# Patient Record
Sex: Female | Born: 1975 | Race: White | Hispanic: No | Smoking: Former smoker
Health system: Southern US, Community
[De-identification: ages and names within clinical notes are randomized; demographics above are authoritative.]

## PROBLEM LIST (undated history)

## (undated) DIAGNOSIS — IMO0002 Reserved for concepts with insufficient information to code with codable children: Secondary | ICD-10-CM

## (undated) DIAGNOSIS — G8929 Other chronic pain: Secondary | ICD-10-CM

## (undated) DIAGNOSIS — R229 Localized swelling, mass and lump, unspecified: Secondary | ICD-10-CM

## (undated) DIAGNOSIS — M7918 Myalgia, other site: Secondary | ICD-10-CM

## (undated) DIAGNOSIS — N319 Neuromuscular dysfunction of bladder, unspecified: Secondary | ICD-10-CM

## (undated) DIAGNOSIS — I10 Essential (primary) hypertension: Secondary | ICD-10-CM

---

## 2004-12-04 HISTORY — PX: FRACTURE SURGERY: SHX138

## 2007-04-18 ENCOUNTER — Encounter: Payer: Self-pay | Admitting: Family Medicine

## 2007-04-18 ENCOUNTER — Ambulatory Visit: Payer: Self-pay | Admitting: Family Medicine

## 2007-04-18 ENCOUNTER — Inpatient Hospital Stay (HOSPITAL_COMMUNITY): Admission: AD | Admit: 2007-04-18 | Discharge: 2007-04-22 | Payer: Self-pay | Admitting: Family Medicine

## 2010-12-25 ENCOUNTER — Encounter: Payer: Self-pay | Admitting: Obstetrics & Gynecology

## 2011-04-18 NOTE — H&P (Signed)
Debbie [name], Debbie [name]                ACCOUNT NO.:  [id number]   MEDICAL RECORD NO.:  [id number]          PATIENT TYPE:  INP   LOCATION:  LDR3                          FACILITY:  [facility]   PHYSICIAN:  Lazaro Arms, M.D.   DATE OF BIRTH:  [birth year]   DATE OF ADMISSION:  04/18/2007  DATE OF DISCHARGE:  LH                              HISTORY & PHYSICAL   HISTORY OF PRESENT ILLNESS:  The patient is a 35 year old female gravida  3, para 1, ectopic 1 with estimated date of delivery of 06/09/06 by last  menstrual period and a 13-week sonogram, who is admitted to the hospital  for evaluation for probable evolving pre-eclampsia.   The patient had pre-eclampsia with her first pregnancy, had to be  induced at 36 weeks, had a failed induction and ended up having a  Cesarean section at that time. This pregnancy her blood pressure at her  first couple of visits, 114/68, 112/71, 100/70. Then by 24 weeks it was  140/80, 140/80 and the on the 17th it was 150/90. With a history of pre-  eclampsia we did a work up which included a 24-hour urine and that  returned as 84 mg of protein in 24 hours with an excellent creatinine  clearance. Her uric acid at that time was 3.8 and her platelet count was  177,000. When she came back the following week, again her protein was  negative in her urine. Actually I saw her 6 days later and her blood  pressure was still up at 150/90. At that time I assumed she had  gestational hypertension but was certainly suspicious. As I informed  her, this could be the beginning of an evolving pre-eclamptic clinical  picture. We have seen her weekly. Her blood pressure has been 140/90,  160/100. Recheck was 150/90 and today she comes in and it is 180/100.  She did take her Aldomet 1000 mg this morning and she has, for the first  time, proteinuria. She has had negative protein throughout.   Today she has 3+ protein. She feels fine. She has no blurry vision. No  CNS symptoms. Reports  good fetal movement and evaluation in the office  is unremarkable. She has got 1+ edema. DTRs are normal with no  significant clonus. However, with a history of pre-eclampsia before and  certainly it appears to be an evolving picture at this point, I am going  to admit her to the hospital for laboratory evaluation and observation.  Will do a CBC, a CMP, a uric acid. A 24-hour urine will be begun for  protein and creatinine clearance. Will do an obstetrical ultrasound for  growth and fluid. Do an NST this morning as well. I will also given her  Celestone 12 mg IM  today and repeat tomorrow in anticipation of  probably having to be delivered prematurely. The patient certainly  understands this and understands that if she were to be transferred  within the next few days, which is what I anticipate, she would be  transferred to [facility] for the neonatal intensive care unit  capabilities  there with a 32.5 week baby at this point. She understands  all this and is going over to the hospital.   PAST MEDICAL HISTORY:  Negative.   PAST SURGICAL HISTORY:  She has just had the C section and some ankle  surgery.   PAST OB HISTORY:  She had an ectopic. In 2001 she had a Cesarean section  for a failed induction at a 36-week pre-eclampsia. She does not have,  she states, hypertension in between pregnancies and never had it before  her first pregnancy.   PHYSICAL EXAMINATION:  HEENT: Unremarkable.  NECK: Thyroid is normal.  LUNGS: Her lungs are clear.  HEART: Regular rhythm without regurg or gallop.  BREASTS: Exam deferred.  GU: Fundal height is 33 cm.  PELVIC: Exam is not performed.  EXTREMITIES: With 1 to 2 + edema and DTRs are normal with no beats of  clonus, which again is normal. She certainly is not hyper-reflexic.   IMPRESSION:  1. Intrauterine pregnancy at 32.5 weeks' gestation.  2. Probable evolving pre-eclampsia.  3. Previous Cesarean section.  4. Failed induction secondary  to pre-eclampsia.   PLAN:  As outlined above. Will admit her to the hospital, do a  laboratory panel for pre-eclamptic evaluation, including the 24-hour  urine. Will get Celestone 12 mg IM today and tomorrow. Will do an OB  ultrasound. She understands that this is a fairly dynamic situation and  that transfer could be almost any time based on what her blood pressures  are doing and her symptomatology. She understands this and knows that  she will more than likely be delivered early requiring neonatal  intensive care unit support for the baby.      Lazaro Arms, M.D.  Electronically Signed     LHE/MEDQ  D:  04/18/2007  T:  04/18/2007  Job:  [id number]

## 2011-04-18 NOTE — Op Note (Signed)
Debbie [name], Debbie [name]                ACCOUNT NO.:  [id number]   MEDICAL RECORD NO.:  [id number]          PATIENT TYPE:  INP   LOCATION:  9372                          FACILITY:  [facility]   PHYSICIAN:  Tracy L. Mayford Knife, M.D.DATE OF BIRTH:  1976/10/18   DATE OF PROCEDURE:  DATE OF DISCHARGE:                               OPERATIVE REPORT   PREOPERATIVE DIAGNOSES:  1. A 36-week-4-day intrauterine pregnancy.  2. Severe preeclampsia.  3. Previous cesarean section.   POSTOPERATIVE DIAGNOSES:  1. A 36-week-4-day intrauterine pregnancy.  2. Severe preeclampsia.  3. Previous cesarean section.   PROCEDURE:  Classical cesarean section via Pfannenstiel skin incision.   SURGEON:  Shelbie Proctor. [name], M.D.   ASSISTANT:  Marc Morgans. Mayford Knife, M.D.   ANESTHESIA:  Spinal.   ESTIMATED BLOOD LOSS:  600.   IV FLUIDS:  1200.   URINE OUTPUT:  500.   SPECIMENS:  Placenta to pathology, cord blood to lab.   INDICATIONS:  A 35 year old G3, P0, 1-1-1 at 32-6, dated by her LMP, who  presented for routine care.  Was found to have elevated blood pressure.  She was admitted for observation at [facility].  Her labs came back with  elevated liver function tests, basically required clinical scenario that  she needed to proceed with delivery via cesarean section.  Patient was  counseled on the risks and benefits and wanted to proceed.   FINDINGS:  Viable female infant in the breech presentation.  Apgars 2 and  8.  Cord pH 7.274.  Weight 1386 gm (3.1 pounds).  Amniotic fluid was  clear.  Placenta had a central cord insertion.   PROCEDURE:  Patient was taken to the operating room where spinal  anesthesia was placed and found to be adequate.  Ultrasound was done at  the bedside to confirm presentation.  The infant was initially  transversed back down and moved to breech during the sonogram.  The  patient was prepped and draped in the usual sterile fashion in the  dorsal supine position with the leftward tilt.  A  Pfannenstiel skin  incision was made and carried down to the fascia.  The fascia was nicked  in the midline, and the incision was extended laterally with the Mayo  scissors.  The anterior aspect of the fascial incision was grasped with  Kocher clamp, tented up, and dissected off the rectus muscles using  Mayo's.  The same thing was done inferiorly.  Rectus muscles were  separated in the midline.  The peritoneum was entered and identified.  There were significant adhesions, to the point that the uterus was  levorotated.  There were adhesions to the peritoneum and the rectus  muscles on the right side of the uterus.  We were unable to take this  down.   The bladder blade was inserted.  A classical incision was made.  The  infant was delivered by complete breech extraction.  Neonatology was  present.  Placenta was delivered.  The uterus was cleared of all clots  and debris.  The uterus was partially exteriorized.  The uterus was  closed with  0 Vicryl in a running locked fashion.  The second layer of  the same suture was used.  Next, a 2-0 SH Vicryl was used to close the  serosa.  Excellent hemostasis was obtained following the use of Avitene.   The fascia was closed with 0 Vicryl.  The skin was closed with staples.  Marcaine was injected after the procedure.   Sponge, lap, and needle counts were correct x2.  The patient did receive  Ancef intraoperatively.  The patient tolerated the procedure well.  There were no complications.           ______________________________  Marc Morgans Mayford Knife, M.D.     TLW/MEDQ  D:  04/18/2007  T:  04/18/2007  Job:  [id number]

## 2011-04-21 NOTE — Discharge Summary (Signed)
NAMEKEILYN, HAGGARD                ACCOUNT NO.:  [id number]   MEDICAL RECORD NO.:  [id number]          PATIENT TYPE:  INP   LOCATION:  9319                          FACILITY:  [facility]   PHYSICIAN:  Phil D. Okey Dupre, M.D.     DATE OF BIRTH:  [birth year]   DATE OF ADMISSION:  04/18/2007  DATE OF DISCHARGE:  04/22/2007                               DISCHARGE SUMMARY   ADMISSION DIAGNOSES:  1. Intrauterine pregnancy at 32+ weeks gestation.  2. Severe preeclampsia.  Intrauterine growth retardation.  3. History of previous cesarean section of breech infant.   DISCHARGE DIAGNOSES:  1. Intrauterine pregnancy at 32+ weeks delivered via classical      cesarean section.  2. Severe preeclampsia.  3. Intrauterine growth retardation.  4. History of previous C-section breech infant.   HOSPITAL COURSE:  The patient is a 35 year old gravida 3, para 1-0-1-1  at 32 weeks 4 days gestation who was transferred from [facility] Dr. Despina Hidden for severe preeclampsia.  The patient had presented to  clinic for routine care found to have elevated blood pressure of 223/118  and 193/105.  The patient was admitted to [facility] for a pre-  eclamptic workup.  Of note, the patient's blood pressure had been  steadily increasing over the last month.  She was started on Aldomet 1  month prior to admission with the dosing increased to 1000 b.i.d. one  week prior to admission.  The patient is also with history of  preeclampsia with first pregnancy with a resultant primary C-section for  a preeclampsia.  At clinic on the day of admission, the patient's blood  pressure was 180/100 with new onset of proteinuria; had 3+ protein on  the dipstick; had no headaches or visual changes.   Liver function tests were found to be elevated.  The patient was given  betamethasone X1, started on mag sulfate, and transferred to [facility] for available NICU bed.  On arrival to Hospital, [facility] labs were  repeated.  LFTs  were worsening and the patient was taken for a repeat  cesarean section.  The patient underwent a classical C-section and  delivered a viable female infant with Apgars of 2/8 at one and five  minutes respectively.  Cord pH was 7.27.  Birth weight was 1386 grams.  The baby was taken to the NIC unit.  Please see operative note for  complete details of the cesarean section.   During her postop course, she was continued on magnesium until postop  day #2 at which time her LFT started trending downward and the patient  was diuresing fairly well.  Her postop course was complicated by  elevated blood pressures, primarily on postop day #2 and #3.  Blood  pressures ranged from systolics 120 to 170s over diastolics of 60s to  110.  She required several doses of IV labetalol and hydralazine.  She  remained asymptomatic throughout her postop course.  She was started on  oral labetalol on postop day #1 with gradual increasing of the dosage to  adequately control her blood pressures.  Her heart rate was in the 60s,  which limited how much of the lobe that labetalol she would tolerate.  Once the magnesium was discontinued, she was started on  hydrochlorothiazide 25 mg daily which helped somewhat, but her blood  pressures continued to spike.  Her third agent, lisinopril 20 mg daily  was added given that the patient was not breast feeding.  This improved  her blood pressure with average blood pressures being 120s use over 80s.  She remained stable and was felt to be hemodynamically stable and was  discharged home on postop day #4.   DISCHARGE MEDICATIONS:  1. Motrin 600 mg take one every 6 hours as needed for pain.  2. Percocet 5/325 mg, take one tabs q.6 h as needed for pain.  3. Colace 100 mg p.o. b.i.d.  4. Labetalol 400 mg p.o. b.i.d.  5. Hydrochlorothiazide 25 mg p.o. daily.  6. Lisinopril 20 mg p.o. daily.  7. For contraception, she is to receive __________ at her 6-week      postpartum visit.    FOLLOWUP:  She is to follow up with Dr. Despina Hidden at [facility] in  [place] in 1 week for blood pressure check and in 6 weeks for her  postpartum visits.  Warning signs of elevated or low blood pressures  were discussed in detail with the patient.  She is to continue pelvic  rest and added no heavy lifting X6 weeks.  She was discharged on regular  diet.     ______________________________  Paticia Stack, MD      Phil D. Okey Dupre, M.D.  Electronically Signed    LNJ/MEDQ  D:  05/20/2007  T:  05/20/2007  Job:  [id number]   cc:   Javier Glazier. Okey Dupre, M.D.

## 2011-05-02 ENCOUNTER — Emergency Department (HOSPITAL_COMMUNITY)
Admission: EM | Admit: 2011-05-02 | Discharge: 2011-05-02 | Disposition: A | Discharge status: Home / Self Care | Payer: Medicaid Other | Attending: Emergency Medicine | Admitting: Emergency Medicine

## 2011-05-02 DIAGNOSIS — T6391XA Toxic effect of contact with unspecified venomous animal, accidental (unintentional), initial encounter: Secondary | ICD-10-CM | POA: Insufficient documentation

## 2011-05-02 DIAGNOSIS — T63461A Toxic effect of venom of wasps, accidental (unintentional), initial encounter: Secondary | ICD-10-CM | POA: Insufficient documentation

## 2011-05-02 DIAGNOSIS — Y998 Other external cause status: Secondary | ICD-10-CM | POA: Insufficient documentation

## 2011-12-05 DIAGNOSIS — IMO0002 Reserved for concepts with insufficient information to code with codable children: Secondary | ICD-10-CM

## 2011-12-05 HISTORY — PX: SPINE SURGERY: SHX786

## 2011-12-05 HISTORY — DX: Reserved for concepts with insufficient information to code with codable children: IMO0002

## 2012-05-29 ENCOUNTER — Emergency Department (HOSPITAL_COMMUNITY)
Admission: EM | Admit: 2012-05-29 | Discharge: 2012-05-30 | Disposition: A | Discharge status: Home / Self Care | Payer: Medicaid Other | Attending: Emergency Medicine | Admitting: Emergency Medicine

## 2012-05-29 ENCOUNTER — Emergency Department (HOSPITAL_COMMUNITY): Payer: Medicaid Other

## 2012-05-29 ENCOUNTER — Encounter (HOSPITAL_COMMUNITY): Payer: Self-pay | Admitting: *Deleted

## 2012-05-29 DIAGNOSIS — M545 Low back pain, unspecified: Secondary | ICD-10-CM | POA: Insufficient documentation

## 2012-05-29 DIAGNOSIS — M549 Dorsalgia, unspecified: Secondary | ICD-10-CM

## 2012-05-29 DIAGNOSIS — R29898 Other symptoms and signs involving the musculoskeletal system: Secondary | ICD-10-CM

## 2012-05-29 DIAGNOSIS — F17200 Nicotine dependence, unspecified, uncomplicated: Secondary | ICD-10-CM | POA: Insufficient documentation

## 2012-05-29 NOTE — ED Notes (Signed)
EKG completed and given to Dr. Adriana Simas

## 2012-05-29 NOTE — ED Notes (Signed)
Pt ambulated to bathroom, gait uncoordinated especially lt sided. Pt able to walk unassisted but appears unsteady. Lt knee pops back and locks on each step.

## 2012-05-29 NOTE — ED Notes (Signed)
Low back pain, with radiation down lt leg , "my lt leg is dragging".  X 2 weeks.  No injury

## 2012-05-29 NOTE — ED Provider Notes (Addendum)
History   This chart was scribed for [employer]. Colon Branch, MD by Melba Coon. The patient was seen in room APA17/APA17 and the patient's care was started at 11:04PM.    CSN: [id number]  Arrival date & time 05/29/12  2150   First MD Initiated Contact with Patient 05/29/12 2300      Chief Complaint  Patient presents with  . Back Pain    (Consider location/radiation/quality/duration/timing/severity/associated sxs/prior treatment) HPI Debbie [name] is a 36 y.o. female who presents to the Emergency Department complaining of constant, moderate  lower right back pain with an onset 2 weeks ago. This is associated with left leg weakness. Pt states that her left leg is "dragging". No falls or trauma to affected area. Stretching her legs aggravates the pain.  No HA, fever, neck pain, sore throat, rash, CP, SOB, n/v/d, dysuria, or extremity edema, weakness to other extremities, numbness, tingling, difficulty talking or swallowing. No known allergies. .  No PCP  History reviewed. No pertinent past medical history.  Past Surgical History  Procedure Date  . Fracture surgery     History reviewed. No pertinent family history.  History  Substance Use Topics  . Smoking status: Current Everyday Smoker  . Smokeless tobacco: Not on file  . Alcohol Use: Yes    OB History    Grav Para Term Preterm Abortions TAB SAB Ect Mult Living                  Review of Systems 10 Systems reviewed and all are negative for acute change except as noted in the HPI.   Allergies  Review of patient's allergies indicates no known allergies.  Home Medications   Current Outpatient Rx  Name Route Sig Dispense Refill  . IBUPROFEN 200 MG PO TABS Oral Take 800-1,000 mg by mouth 5 (five) times daily as needed. For pain      BP 197/117  Pulse 93  Temp 98.4 F (36.9 C) (Oral)  Resp 20  Ht 5' 8.5" (1.74 m)  Wt 200 lb (90.719 kg)  BMI 29.97 kg/m2  SpO2 99%  LMP 05/04/2012  Physical Exam  Nursing note  and vitals reviewed. Constitutional: She is oriented to person, place, and time. She appears well-developed and well-nourished. No distress.  HENT:  Head: Normocephalic and atraumatic.  Right Ear: External ear normal.  Left Ear: External ear normal.  Eyes: EOM are normal.  Neck: Normal range of motion. No tracheal deviation present.  Cardiovascular: Normal rate.   Pulmonary/Chest: Effort normal. No respiratory distress.  Abdominal: There is no tenderness.  Musculoskeletal: Normal range of motion. She exhibits tenderness (right lower back). She exhibits no edema.       No palpable deformities on back.  Neurological: She is alert and oriented to person, place, and time.  Skin: Skin is warm and dry.  Psychiatric: She has a normal mood and affect. Her behavior is normal.    ED Course  Procedures (including critical care time)  DIAGNOSTIC STUDIES: Oxygen Saturation is 99% on room air, normal by my interpretation.    COORDINATION OF CARE:  11:08PM - EDMD will examine pt walking to notice if there is leg dragging 11:16PM - Pt was walked around the ED; antalgic gait with mild foot drop on right.  Dg Lumbar Spine Complete  05/30/2012  *RADIOLOGY REPORT*  Clinical Data: 36 year old female with low back pain times 3 weeks.  LUMBAR SPINE - COMPLETE 4+ VIEW  Comparison: None.  Findings: For the purposes of  this report, the lowest full size ribs will be designated at T11, with absent ribs at T12.  Bone mineralization is within normal limits.  Normal vertebral height and alignment.  Relatively preserved disc spaces.  No pars fracture.  Sacral ala and SI joints within normal limits.  IMPRESSION: Transitional anatomy, otherwise negative radiographic appearance of the lumbar spine.  Original Report Authenticated By: Harley Hallmark, M.D.    Date: 05/29/2012   2230  Rate: 74  Rhythm: normal sinus rhythm  QRS Axis: normal  Intervals: normal  ST/T Wave abnormalities: nonspecific ST/T changes   Conduction Disutrbances:none  Narrative Interpretation:   Old EKG Reviewed: none available   MDM  Patient with low back pain and weakness to left leg x 2 weeks. LS spine films negative. Patient scheduled for MRI. Pt stable in ED with no significant deterioration in condition.The patient appears reasonably screened and/or stabilized for discharge and I doubt any other medical condition or other [facility] requiring further screening, evaluation, or treatment in the ED at this time prior to discharge.  I personally performed the services described in this documentation, which was scribed in my presence. The recorded information has been reviewed and considered.   MDM Reviewed: nursing note and vitals Interpretation: x-ray          Nicoletta Dress. Colon Branch, MD 05/30/12 0981  Nicoletta Dress. Colon Branch, MD 06/20/12 2123

## 2012-05-30 LAB — URINALYSIS, ROUTINE W REFLEX MICROSCOPIC
Bilirubin Urine: NEGATIVE
Nitrite: POSITIVE — AB
Urobilinogen, UA: 0.2 mg/dL (ref 0.0–1.0)

## 2012-05-30 MED ORDER — HYDROCODONE-ACETAMINOPHEN 5-325 MG PO TABS
1.0000 | ORAL_TABLET | Freq: Once | ORAL | Status: AC
  Administered 2012-05-30: 1 via ORAL
  Filled 2012-05-30: qty 1

## 2012-05-30 MED ORDER — HYDROCODONE-ACETAMINOPHEN 5-325 MG PO TABS: 1.0000 | ORAL_TABLET | ORAL | Status: AC | PRN

## 2012-05-30 MED ORDER — IBUPROFEN 800 MG PO TABS
800.0000 mg | ORAL_TABLET | Freq: Once | ORAL | Status: AC
  Administered 2012-05-30: 800 mg via ORAL
  Filled 2012-05-30: qty 1

## 2012-05-30 NOTE — Discharge Instructions (Signed)
Your xray here tonight did not show anything. You are being scheduled for an MRI. The radiology department will call you tomorrow with the date and time. Once you have finished your MRI you will be brought to the ER waiting room where the doctor on duty will review your results with you. Use the pain medicine as needed.

## 2012-05-30 NOTE — ED Notes (Signed)
Discharge instructions reviewed with pt, questions answered. Pt verbalized understanding.  

## 2012-07-08 ENCOUNTER — Emergency Department (HOSPITAL_COMMUNITY)
Admission: EM | Admit: 2012-07-08 | Discharge: 2012-07-08 | Disposition: A | Discharge status: Home / Self Care | Payer: Medicaid Other | Attending: Emergency Medicine | Admitting: Emergency Medicine

## 2012-07-08 ENCOUNTER — Encounter (HOSPITAL_COMMUNITY): Payer: Self-pay | Admitting: *Deleted

## 2012-07-08 DIAGNOSIS — F17200 Nicotine dependence, unspecified, uncomplicated: Secondary | ICD-10-CM | POA: Insufficient documentation

## 2012-07-08 DIAGNOSIS — N390 Urinary tract infection, site not specified: Secondary | ICD-10-CM | POA: Insufficient documentation

## 2012-07-08 DIAGNOSIS — I10 Essential (primary) hypertension: Secondary | ICD-10-CM

## 2012-07-08 HISTORY — DX: Essential (primary) hypertension: I10

## 2012-07-08 LAB — URINALYSIS, ROUTINE W REFLEX MICROSCOPIC
Ketones, ur: NEGATIVE mg/dL
Protein, ur: NEGATIVE mg/dL
Urobilinogen, UA: 0.2 mg/dL (ref 0.0–1.0)

## 2012-07-08 LAB — CBC WITH DIFFERENTIAL/PLATELET
Eosinophils Relative: 2 % (ref 0–5)
HCT: 40 % (ref 36.0–46.0)
Lymphocytes Relative: 33 % (ref 12–46)
Lymphs Abs: 3.2 10*3/uL (ref 0.7–4.0)
MCV: 91.5 fL (ref 78.0–100.0)
Monocytes Absolute: 1 10*3/uL (ref 0.1–1.0)
RBC: 4.37 MIL/uL (ref 3.87–5.11)
WBC: 9.6 10*3/uL (ref 4.0–10.5)

## 2012-07-08 LAB — COMPREHENSIVE METABOLIC PANEL
ALT: 10 U/L (ref 0–35)
CO2: 24 mEq/L (ref 19–32)
Calcium: 9.5 mg/dL (ref 8.4–10.5)
Creatinine, Ser: 0.65 mg/dL (ref 0.50–1.10)
GFR calc Af Amer: >90 mL/min (ref >90)
GFR calc non Af Amer: >90 mL/min (ref >90)
Glucose, Bld: 88 mg/dL (ref 70–99)
Sodium: 136 mEq/L (ref 135–145)

## 2012-07-08 LAB — URINE MICROSCOPIC-ADD ON

## 2012-07-08 MED ORDER — CEPHALEXIN 500 MG PO CAPS: 500.0000 mg | ORAL_CAPSULE | Freq: Four times a day (QID) | ORAL | Status: AC

## 2012-07-08 MED ORDER — HYDROCHLOROTHIAZIDE 12.5 MG PO TABS: 12.5000 mg | ORAL_TABLET | Freq: Every day | ORAL | Status: DC

## 2012-07-08 MED ORDER — HYDROCHLOROTHIAZIDE 25 MG PO TABS
25.0000 mg | ORAL_TABLET | Freq: Every day | ORAL | Status: DC
  Administered 2012-07-08: 25 mg via ORAL
  Filled 2012-07-08 (×3): qty 1

## 2012-07-08 NOTE — ED Provider Notes (Signed)
History     CSN: [id number]  Arrival date & time 07/08/12  1556   First MD Initiated Contact with Patient 07/08/12 1651      Chief Complaint  Patient presents with  . Hypertension    (Consider location/radiation/quality/duration/timing/severity/associated sxs/prior treatment) HPI Comments: Patient sent from health department elevated blood pressure. Denies symptoms. Had hypotension when she was pregnant but not after. She denies any chest pain, visual change, headache, shortness of breath, nausea vomiting or abdominal pain. She's being seen at the health department for low back pain which is a constantly for the past 2 months as previously evaluated in June here. She denies any change in his pain, focal weakness, numbness, tingling, bowel or bladder incontinence. She was scheduled for an MRI which he did not have completed.  The history is provided by the patient.    Past Medical History  Diagnosis Date  . Hypertension     Past Surgical History  Procedure Date  . Fracture surgery   . Cesarean section     History reviewed. No pertinent family history.  History  Substance Use Topics  . Smoking status: Current Everyday Smoker  . Smokeless tobacco: Not on file  . Alcohol Use: Yes     occ    OB History    Grav Para Term Preterm Abortions TAB SAB Ect Mult Living                  Review of Systems  Constitutional: Negative for fever, activity change and appetite change.  HENT: Negative for congestion and rhinorrhea.   Respiratory: Negative for cough, chest tightness and shortness of breath.   Cardiovascular: Negative for chest pain.  Gastrointestinal: Negative for nausea, vomiting and abdominal pain.  Genitourinary: Negative for dysuria.  Musculoskeletal: Positive for back pain.  Skin: Negative for rash.  Neurological: Negative for dizziness, weakness and headaches.  Hematological: Negative for adenopathy.    Allergies  Honey bee treatment  Home Medications    Current Outpatient Rx  Name Route Sig Dispense Refill  . IBUPROFEN 200 MG PO TABS Oral Take 800-1,000 mg by mouth every 6 (six) hours as needed. For pain    . NAPROXEN SODIUM 220 MG PO CAPS Oral Take 660 mg by mouth 3 (three) times daily as needed. For pain      BP 189/105  Pulse 76  Temp 98.6 F (37 C) (Oral)  Resp 20  Ht 5\' 7"  (1.702 m)  Wt 204 lb (92.534 kg)  BMI 31.95 kg/m2  SpO2 100%  LMP 06/03/2012  Physical Exam  Constitutional: She appears well-developed. No distress.  HENT:  Head: Normocephalic and atraumatic.  Mouth/Throat: Oropharynx is clear and moist. No oropharyngeal exudate.  Eyes: Conjunctivae are normal. Pupils are equal, round, and reactive to light.  Neck: Normal range of motion. Neck supple.  Cardiovascular: Normal rate, regular rhythm and normal heart sounds.   No murmur heard. Pulmonary/Chest: Effort normal and breath sounds normal. No respiratory distress.  Abdominal: Soft. There is no tenderness. There is no rebound and no guarding.  Musculoskeletal: Normal range of motion. She exhibits no edema and no tenderness.  Neurological: She is alert. She has normal reflexes. No cranial nerve deficit.       5/5 bilateral lower extremities. +2 DP and PT pulses. Great toe extension intact bilaterally.  +2 patellar reflexes bilaterally.  Distal sensation intact.  Skin: Skin is warm.    ED Course  Procedures (including critical care time)  Labs Reviewed  URINALYSIS,  ROUTINE W REFLEX MICROSCOPIC - Abnormal; Notable for the following:    APPearance CLOUDY (*)     Hgb urine dipstick SMALL (*)     Nitrite POSITIVE (*)     All other components within normal limits  URINE MICROSCOPIC-ADD ON - Abnormal; Notable for the following:    Squamous Epithelial / LPF MANY (*)     Bacteria, UA MANY (*)     All other components within normal limits  CBC WITH DIFFERENTIAL  COMPREHENSIVE METABOLIC PANEL  TROPONIN I   No results found.   No diagnosis found.    MDM   Asymptomatic hypertension.  No neurological deficits. No chest pain, shortness of breath, nausea or vomiting. No headache or visual change.  Screening labs to evaluate for end organ damage.  Labs reviewed. No evidence of endorgan damage. We'll treat UTI.  Start patient on low-dose hydrochlorothiazide for hypertension. Followup with her doctor at the health department.    Date: 07/08/2012  Rate: 74  Rhythm: normal sinus rhythm  QRS Axis: normal  Intervals: normal  ST/T Wave abnormalities: normal  Conduction Disutrbances:none  Narrative Interpretation:   Old EKG Reviewed: none available    Glynn Octave, MD 07/08/12 1825

## 2012-07-08 NOTE — ED Notes (Signed)
Sent from health dept due to elevated bp.

## 2012-07-29 ENCOUNTER — Other Ambulatory Visit (HOSPITAL_COMMUNITY): Payer: Self-pay | Admitting: Nurse Practitioner

## 2012-07-29 DIAGNOSIS — M25551 Pain in right hip: Secondary | ICD-10-CM

## 2012-07-29 DIAGNOSIS — M549 Dorsalgia, unspecified: Secondary | ICD-10-CM

## 2012-08-01 ENCOUNTER — Ambulatory Visit (HOSPITAL_COMMUNITY)
Admission: RE | Admit: 2012-08-01 | Discharge: 2012-08-01 | Disposition: A | Discharge status: Home / Self Care | Payer: Medicaid Other | Source: Ambulatory Visit | Attending: Family Medicine | Admitting: Family Medicine

## 2012-08-01 DIAGNOSIS — R209 Unspecified disturbances of skin sensation: Secondary | ICD-10-CM | POA: Insufficient documentation

## 2012-08-01 DIAGNOSIS — M545 Low back pain, unspecified: Secondary | ICD-10-CM | POA: Insufficient documentation

## 2012-08-01 DIAGNOSIS — M549 Dorsalgia, unspecified: Secondary | ICD-10-CM

## 2012-08-01 DIAGNOSIS — M51379 Other intervertebral disc degeneration, lumbosacral region without mention of lumbar back pain or lower extremity pain: Secondary | ICD-10-CM | POA: Insufficient documentation

## 2012-08-01 DIAGNOSIS — M25551 Pain in right hip: Secondary | ICD-10-CM

## 2012-08-01 DIAGNOSIS — M5137 Other intervertebral disc degeneration, lumbosacral region: Secondary | ICD-10-CM | POA: Insufficient documentation

## 2012-08-01 DIAGNOSIS — M5126 Other intervertebral disc displacement, lumbar region: Secondary | ICD-10-CM | POA: Insufficient documentation

## 2012-11-07 ENCOUNTER — Inpatient Hospital Stay (HOSPITAL_COMMUNITY)
Admission: EM | Admit: 2012-11-07 | Discharge: 2012-11-18 | DRG: 982 | Disposition: A | Discharge status: Rehabilitation Facility | Payer: Medicaid Other | Attending: Internal Medicine | Admitting: Internal Medicine

## 2012-11-07 ENCOUNTER — Emergency Department (HOSPITAL_COMMUNITY): Payer: Medicaid Other

## 2012-11-07 ENCOUNTER — Encounter (HOSPITAL_COMMUNITY): Payer: Self-pay | Admitting: *Deleted

## 2012-11-07 DIAGNOSIS — R209 Unspecified disturbances of skin sensation: Secondary | ICD-10-CM | POA: Diagnosis present

## 2012-11-07 DIAGNOSIS — R32 Unspecified urinary incontinence: Secondary | ICD-10-CM | POA: Diagnosis present

## 2012-11-07 DIAGNOSIS — R29898 Other symptoms and signs involving the musculoskeletal system: Secondary | ICD-10-CM | POA: Diagnosis present

## 2012-11-07 DIAGNOSIS — G8220 Paraplegia, unspecified: Secondary | ICD-10-CM | POA: Diagnosis present

## 2012-11-07 DIAGNOSIS — R200 Anesthesia of skin: Secondary | ICD-10-CM

## 2012-11-07 DIAGNOSIS — F17200 Nicotine dependence, unspecified, uncomplicated: Secondary | ICD-10-CM | POA: Diagnosis present

## 2012-11-07 DIAGNOSIS — D1779 Benign lipomatous neoplasm of other sites: Principal | ICD-10-CM | POA: Diagnosis present

## 2012-11-07 DIAGNOSIS — G9520 Unspecified cord compression: Secondary | ICD-10-CM | POA: Diagnosis present

## 2012-11-07 DIAGNOSIS — R159 Full incontinence of feces: Secondary | ICD-10-CM | POA: Diagnosis present

## 2012-11-07 DIAGNOSIS — I10 Essential (primary) hypertension: Secondary | ICD-10-CM | POA: Diagnosis present

## 2012-11-07 DIAGNOSIS — E876 Hypokalemia: Secondary | ICD-10-CM | POA: Diagnosis present

## 2012-11-07 DIAGNOSIS — K5900 Constipation, unspecified: Secondary | ICD-10-CM | POA: Diagnosis present

## 2012-11-07 DIAGNOSIS — Z79899 Other long term (current) drug therapy: Secondary | ICD-10-CM

## 2012-11-07 DIAGNOSIS — G959 Disease of spinal cord, unspecified: Secondary | ICD-10-CM | POA: Diagnosis present

## 2012-11-07 LAB — CBC WITH DIFFERENTIAL/PLATELET
Eosinophils Relative: 3 % (ref 0–5)
HCT: 39.1 % (ref 36.0–46.0)
Hemoglobin: 13.4 g/dL (ref 12.0–15.0)
Lymphocytes Relative: 40 % (ref 12–46)
Lymphs Abs: 4.8 10*3/uL — ABNORMAL HIGH (ref 0.7–4.0)
MCV: 91.4 fL (ref 78.0–100.0)
Monocytes Absolute: 0.9 10*3/uL (ref 0.1–1.0)
Monocytes Relative: 8 % (ref 3–12)
RBC: 4.28 MIL/uL (ref 3.87–5.11)
RDW: 13.5 % (ref 11.5–15.5)
WBC: 11.9 10*3/uL — ABNORMAL HIGH (ref 4.0–10.5)

## 2012-11-07 LAB — BASIC METABOLIC PANEL
CO2: 25 mEq/L (ref 19–32)
Calcium: 9.7 mg/dL (ref 8.4–10.5)
Chloride: 104 mEq/L (ref 96–112)
Creatinine, Ser: 0.73 mg/dL (ref 0.50–1.10)
Glucose, Bld: 98 mg/dL (ref 70–99)

## 2012-11-07 NOTE — ED Notes (Signed)
Updated on plan of care: aware that she will be transported to MRI and that lab will come draw blood. No further needs at this time.

## 2012-11-07 NOTE — ED Notes (Signed)
Patient transported to MRI 

## 2012-11-07 NOTE — ED Notes (Signed)
The pt has chronic back pauring this time she has had a fall.  She had a mri in [place] and she has had more pain and loss of mobility with loss of bowel and bladder control for the past 1-2 weeks and

## 2012-11-07 NOTE — ED Provider Notes (Signed)
History     CSN: [id number]  Arrival date & time 11/07/12  1646   First MD Initiated Contact with Patient 11/07/12 2032      Chief Complaint  Patient presents with  . Back Pain    (Consider location/radiation/quality/duration/timing/severity/associated sxs/prior treatment) HPI Comments: This is a 36 year old female, who presents emergency department with chief complaint of chronic low back pain. She states that one week ago she began having bowel and bladder incontinence. She states that sometimes she is able to feel the need to urinate or have a bowel movement but at other times she does not feel the urge and finds that she has become incontinent of bowel and/or bladder. She states that she has numbness in bilateral lower extremities. She also endorses several falls secondary to leg weakness. She has an appointment scheduled with Dr. Channing Mutters on December 12. She has tried Tylenol, ibuprofen, and Naprosyn to alleviate her symptoms, with no relief.  The history is provided by the patient. No language interpreter was used.    Past Medical History  Diagnosis Date  . Hypertension     Past Surgical History  Procedure Date  . Fracture surgery   . Cesarean section     History reviewed. No pertinent family history.  History  Substance Use Topics  . Smoking status: Current Every Day Smoker  . Smokeless tobacco: Not on file  . Alcohol Use: Yes     Comment: occ    OB History    Grav Para Term Preterm Abortions TAB SAB Ect Mult Living                  Review of Systems  All other systems reviewed and are negative.    Allergies  Honey bee treatment  Home Medications   Current Outpatient Rx  Name  Route  Sig  Dispense  Refill  . HYDROCHLOROTHIAZIDE 12.5 MG PO TABS   Oral   Take 1 tablet (12.5 mg total) by mouth daily.   30 tablet   0   . IBUPROFEN 200 MG PO TABS   Oral   Take 800-1,000 mg by mouth every 6 (six) hours as needed. For pain         . NAPROXEN SODIUM  220 MG PO CAPS   Oral   Take 660 mg by mouth 3 (three) times daily as needed. For pain           BP 169/99  Pulse 87  Temp 98.7 F (37.1 C) (Oral)  Resp 16  SpO2 98%  LMP 11/07/2012  Physical Exam  Nursing note and vitals reviewed. Constitutional: She is oriented to person, place, and time. She appears well-developed and well-nourished.  HENT:  Head: Normocephalic and atraumatic.  Eyes: Conjunctivae normal and EOM are normal. Pupils are equal, round, and reactive to light.  Neck: Normal range of motion. Neck supple.  Cardiovascular: Normal rate and regular rhythm.  Exam reveals no gallop and no friction rub.   No murmur heard. Pulmonary/Chest: Effort normal and breath sounds normal. No respiratory distress. She has no wheezes. She has no rales. She exhibits no tenderness.  Abdominal: Soft. Bowel sounds are normal. She exhibits no distension and no mass. There is no tenderness. There is no rebound and no guarding.  Genitourinary: Rectum normal. Rectal exam shows anal tone normal.  Musculoskeletal: Normal range of motion. She exhibits no edema and no tenderness.  Neurological: She is alert and oriented to person, place, and time.  Patella and Achilles reflexes hyperreactive bilaterally, unable to distinguish between sharp and dull sensation, unable to sense light touch with eyes closed.  Skin: Skin is warm and dry.  Psychiatric: She has a normal mood and affect. Her behavior is normal. Judgment and thought content normal.    ED Course  Procedures (including critical care time)  Results for orders placed during the hospital encounter of 11/07/12  CBC WITH DIFFERENTIAL      Component Value Range   WBC 11.9 (*) 4.0 - 10.5 K/uL   RBC 4.28  3.87 - 5.11 MIL/uL   Hemoglobin 13.4  12.0 - 15.0 g/dL   HCT 40.9  81.1 - 91.4 %   MCV 91.4  78.0 - 100.0 fL   MCH 31.3  26.0 - 34.0 pg   MCHC 34.3  30.0 - 36.0 g/dL   RDW 78.2  95.6 - 21.3 %   Platelets 259  150 - 400 K/uL    Neutrophils Relative 49  43 - 77 %   Neutro Abs 5.8  1.7 - 7.7 K/uL   Lymphocytes Relative 40  12 - 46 %   Lymphs Abs 4.8 (*) 0.7 - 4.0 K/uL   Monocytes Relative 8  3 - 12 %   Monocytes Absolute 0.9  0.1 - 1.0 K/uL   Eosinophils Relative 3  0 - 5 %   Eosinophils Absolute 0.3  0.0 - 0.7 K/uL   Basophils Relative 0  0 - 1 %   Basophils Absolute 0.1  0.0 - 0.1 K/uL  BASIC METABOLIC PANEL      Component Value Range   Sodium 142  135 - 145 mEq/L   Potassium 3.4 (*) 3.5 - 5.1 mEq/L   Chloride 104  96 - 112 mEq/L   CO2 25  19 - 32 mEq/L   Glucose, Bld 98  70 - 99 mg/dL   BUN 13  6 - 23 mg/dL   Creatinine, Ser 0.86  0.50 - 1.10 mg/dL   Calcium 9.7  8.4 - 57.8 mg/dL   GFR calc non Af Amer >90  >90 mL/min   GFR calc Af Amer >90  >90 mL/min   Mr Lumbar Spine Wo Contrast  11/07/2012  *RADIOLOGY REPORT*  Clinical Data: Worsening bowel and bladder incontinence for the past week.  MRI LUMBAR SPINE WITHOUT CONTRAST  Technique:  Multiplanar and multiecho pulse sequences of the lumbar spine were obtained without intravenous contrast.  Comparison: 08/01/2012.  Findings: Last fully open disc space is labeled L5-S1.  Present examination incorporates from T10-11 disc space through the lower sacrum.  Conus L1 level.  No compression of the distal cord or conus.  No abnormal signal within the distal cord or conus.  Visualized paravertebral structures without gross abnormality. Evaluation slightly limited by respiratory motion.  No worrisome bony lesion.  T10-11:  Mild left-sided facet bony overgrowth.  T11-12:  Minimal left facet bony overgrowth.  T12-L1:  Negative.  L1-2:  Negative.  L2-3:  Very small left foraminal/lateral protrusion approaching but not compressing the exiting left L2 nerve root.  Minimal bulge. Very mild facet joint degenerative changes.  L3-4:  Prominent right-sided facet joint degenerative changes containing fluid undermining the right ligamenta flava.  Mild left- sided facet joint degenerative  changes.  Bulge with lateral extension approaching but not compressing the exiting L3 nerve roots.  Dorsal epidural fat.  Mild spinal stenosis  L4-5:  Mild facet joint degenerative changes greater on the right. Mild ligamentum flavum hypertrophy.  Bulge.  Left lateral disc osteophyte complex approaches but does not compress the exiting left L4 nerve root.  Very mild spinal stenosis.  L5-S1:  No significant spinal stenosis or foraminal narrowing.  IMPRESSION: No conus compression detected as cause of patient's symptoms.  Scattered degenerative changes most notable at the L3-4 level as detailed above. Findings without significant change.   Original Report Authenticated By: Lacy Duverney, M.D.       1. Bowel and bladder incontinence   2. Lower extremity numbness       MDM  36 year old female with low back pain, and bowel and bladder incontinence. I have discussed this patient with Dr. exam and, who is also personally seen the patient. I have also consulted Dr. Venetia Maxon, who recommends repeat MRI, and if no surgical process, to consult neurology.  11:02 PM Received MRI results.  Discussed them with Dr. Estell Harpin.  Will consult neurology per Dr. Fredrich Birks recommendations.  11:18 PM Spoke with Dr. Roseanne Reno with Neurology.  He tells me to have the patient admitted to medicine, and he will see the patient in the morning.  The patient will need cervical and thoracic MRI.   11:59 PM Spoke with Hospitalist, patient will be admitted.  Neuro to see patient in the AM, cervical and thoracic MRI in the AM.  Dr. Izola Price will be the admitting physician.       Roxy Horseman, PA-C 11/08/12 0001

## 2012-11-07 NOTE — ED Notes (Signed)
Pt returned from MRI. A.O. X 4. Pain 10/10. Laying flat on stretcher per request. NAD.

## 2012-11-07 NOTE — ED Notes (Addendum)
Pt reports pinched static nerve. Reports MRI in aug. Has appointment with neurologist Dec 12 in [place]. Unable to control bowel and bladder function x 1 week. Unable to ambulate without assistance x 1 week. " My legs give out". Reports decreased sensation in lower extremity bilaterally.  Reports family hxt of MS (father). Pt has not been tested. Pain in right hip 10/10. Currently taking ibuprofen with no relief.  Reports metal rod in right ankle.  Witness muscle tremor in left foot lasting approx 5 seconds. Pt laying flat per request. A.O. X 4.

## 2012-11-07 NOTE — H&P (Addendum)
[facility] History and Physical  DESTANI WAMSER EAV:[id number] DOB: [birth year] DOA: 11/07/2012  Referring physician: ED physician PCP: Tylene Fantasia., PA   Chief Complaint: Bilateral  Lower extremity weakness  HPI:  Pt is 36 yo female who presents with one week long progressive lower extremity weakness associated with poor ambulation and unsteady gait, pain, urinary incontinence. She describes pain as intermittent, sharp, 7/10 in severity when presents with no specific alleviating factors and no specific aggravating factors. She denies fevers, chills, recent medications changes, no recent hospitalizations and no recent traveling, no sick contacts or exposures. Pt denies similar events  In the past. She also experiences occasional numbness and tingling in lower extremities.   Assessment and Plan: Principal Problem:  *Lower extremity weakness - unclear etiology and certainly possible demyelinating process: ? Transverse myelitis vs MS vs other neuropathy - neurology consulted and Dr. Roseanne Reno to see in AM - supportive care for now, analgesia as needed - MRI cervical and thoracic spine ordered per neurology recommendations - PT evaluation  - obtain UDS Active Problems:  HTN (hypertension) - BP above target range - continue home medication regimen for now as I am not clear if pt is quite compliant with her therapy - UDS pending   Hypokalemia - mild - will supplement - BMP in AM  Code Status: Full Family Communication: Pt at bedside Disposition Plan: PT evaluation, admit to medical floor   Review of Systems:  Constitutional: Negative for fever, chills and malaise/fatigue. Negative for diaphoresis.  HENT: Negative for hearing loss, ear pain, nosebleeds, congestion, sore throat, neck pain, tinnitus and ear discharge.   Eyes: Negative for blurred vision, double vision, photophobia, pain, discharge and redness.  Respiratory: Negative for cough, hemoptysis, sputum production,  shortness of breath, wheezing and stridor.   Cardiovascular: Negative for chest pain, palpitations, orthopnea, claudication and leg swelling.  Gastrointestinal: Negative for nausea, vomiting and abdominal pain. Negative for heartburn, constipation, blood in stool and melena.  Genitourinary: Negative for dysuria, urgency, frequency, hematuria and flank pain.  Musculoskeletal: Negative for myalgias, back pain, joint pain and falls.  Skin: Negative for itching and rash.  Neurological: Negative for dizziness, positive for lower extremity weakness and numbness  Endo/Heme/Allergies: Negative for environmental allergies and polydipsia. Does not bruise/bleed easily.  Psychiatric/Behavioral: Negative for suicidal ideas. The patient is not nervous/anxious.      Past Medical History  Diagnosis Date  . Hypertension     Past Surgical History  Procedure Date  . Fracture surgery   . Cesarean section     Social History:  reports that she has been smoking.  She does not have any smokeless tobacco history on file. She reports that she drinks alcohol. She reports that she does not use illicit drugs.  Allergies  Allergen Reactions  . Honey Bee Treatment (Bee Venom) Anaphylaxis and Itching    No family history of heart disease or cancers.  Prior to Admission medications   Medication Sig Start Date End Date Taking? Authorizing Provider  acetaminophen (TYLENOL) 500 MG tablet Take 2,000 mg by mouth every 6 (six) hours as needed. For pain.   Yes Historical Provider, MD  ibuprofen (ADVIL,MOTRIN) 200 MG tablet Take 800-1,000 mg by mouth every 6 (six) hours as needed. For pain   Yes Historical Provider, MD  lisinopril-hydrochlorothiazide (PRINZIDE,ZESTORETIC) 20-12.5 MG per tablet Take 1 tablet by mouth daily.   Yes Historical Provider, MD  Naproxen Sodium (ALEVE) 220 MG CAPS Take 660 mg by mouth 3 (three) times daily as  needed. For pain    Historical Provider, MD    Physical Exam: Filed Vitals:    11/07/12 1657 11/07/12 2043  BP: 179/105 169/99  Pulse: 82 87  Temp: 98.4 F (36.9 C) 98.7 F (37.1 C)  TempSrc: Oral   Resp: 18 16  SpO2: 99% 98%    Physical Exam  Constitutional: Appears well-developed and well-nourished. No distress.  HENT: Normocephalic. External right and left ear normal. Oropharynx is clear and moist.  Eyes: Conjunctivae and EOM are normal. PERRLA, no scleral icterus.  Neck: Normal ROM. Neck supple. No JVD. No tracheal deviation. No thyromegaly.  CVS: RRR, S1/S2 +, no murmurs, no gallops, no carotid bruit.  Pulmonary: Effort and breath sounds normal, no stridor, rhonchi, wheezes, rales.  Abdominal: Soft. BS +,  no distension, tenderness, rebound or guarding.  Musculoskeletal: Normal range of motion. No edema and no tenderness.  Lymphadenopathy: No lymphadenopathy noted, cervical, inguinal. Neuro: Alert. Normal muscle tone and coordination. Bilateral lower extremity weakness 3/5. No cranial nerve deficit. Decreased sensation in bilateral lower extremities Skin: Skin is warm and dry. No rash noted. Not diaphoretic. No erythema. No pallor.  Psychiatric: Normal mood and affect. Behavior, judgment, thought content normal.   Labs on Admission:  Basic Metabolic Panel:  Lab 11/07/12 9604  NA 142  K 3.4*  CL 104  CO2 25  GLUCOSE 98  BUN 13  CREATININE 0.73  CALCIUM 9.7  MG --  PHOS --   CBC:  Lab 11/07/12 2116  WBC 11.9*  NEUTROABS 5.8  HGB 13.4  HCT 39.1  MCV 91.4  PLT 259    Radiological Exams on Admission: Mr Lumbar Spine Wo Contrast  11/07/2012  *RADIOLOGY REPORT*  Clinical Data: Worsening bowel and bladder incontinence for the past week.  MRI LUMBAR SPINE WITHOUT CONTRAST  Technique:  Multiplanar and multiecho pulse sequences of the lumbar spine were obtained without intravenous contrast.  Comparison: 08/01/2012.  Findings: Last fully open disc space is labeled L5-S1.  Present examination incorporates from T10-11 disc space through the lower  sacrum.  Conus L1 level.  No compression of the distal cord or conus.  No abnormal signal within the distal cord or conus.  Visualized paravertebral structures without gross abnormality. Evaluation slightly limited by respiratory motion.  No worrisome bony lesion.  T10-11:  Mild left-sided facet bony overgrowth.  T11-12:  Minimal left facet bony overgrowth.  T12-L1:  Negative.  L1-2:  Negative.  L2-3:  Very small left foraminal/lateral protrusion approaching but not compressing the exiting left L2 nerve root.  Minimal bulge. Very mild facet joint degenerative changes.  L3-4:  Prominent right-sided facet joint degenerative changes containing fluid undermining the right ligamenta flava.  Mild left- sided facet joint degenerative changes.  Bulge with lateral extension approaching but not compressing the exiting L3 nerve roots.  Dorsal epidural fat.  Mild spinal stenosis  L4-5:  Mild facet joint degenerative changes greater on the right. Mild ligamentum flavum hypertrophy.  Bulge.  Left lateral disc osteophyte complex approaches but does not compress the exiting left L4 nerve root.  Very mild spinal stenosis.  L5-S1:  No significant spinal stenosis or foraminal narrowing.  IMPRESSION: No conus compression detected as cause of patient's symptoms.  Scattered degenerative changes most notable at the L3-4 level as detailed above. Findings without significant change.   Original Report Authenticated By: Lacy Duverney, M.D.     EKG: Normal sinus rhythm, no ST/T wave changes  Debbora Presto, MD  [facility] Pager [phone]  If  7PM-7AM, please contact night-coverage [email or web address] Password [email or web address] 11/07/2012, 11:55 PM

## 2012-11-08 ENCOUNTER — Encounter (HOSPITAL_COMMUNITY): Payer: Self-pay | Admitting: *Deleted

## 2012-11-08 ENCOUNTER — Inpatient Hospital Stay (HOSPITAL_COMMUNITY): Payer: Medicaid Other

## 2012-11-08 DIAGNOSIS — G959 Disease of spinal cord, unspecified: Secondary | ICD-10-CM | POA: Diagnosis present

## 2012-11-08 DIAGNOSIS — E876 Hypokalemia: Secondary | ICD-10-CM | POA: Diagnosis present

## 2012-11-08 DIAGNOSIS — I10 Essential (primary) hypertension: Secondary | ICD-10-CM | POA: Diagnosis present

## 2012-11-08 DIAGNOSIS — G8220 Paraplegia, unspecified: Secondary | ICD-10-CM | POA: Diagnosis present

## 2012-11-08 DIAGNOSIS — R29898 Other symptoms and signs involving the musculoskeletal system: Secondary | ICD-10-CM | POA: Diagnosis present

## 2012-11-08 LAB — RAPID URINE DRUG SCREEN, HOSP PERFORMED
Cocaine: NOT DETECTED
Opiates: POSITIVE — AB

## 2012-11-08 LAB — BASIC METABOLIC PANEL
BUN: 14 mg/dL (ref 6–23)
Calcium: 9.8 mg/dL (ref 8.4–10.5)
Creatinine, Ser: 0.71 mg/dL (ref 0.50–1.10)
GFR calc Af Amer: >90 mL/min (ref >90)
GFR calc non Af Amer: >90 mL/min (ref >90)
Potassium: 4 mEq/L (ref 3.5–5.1)

## 2012-11-08 LAB — GLUCOSE, CAPILLARY: Glucose-Capillary: 148 mg/dL — ABNORMAL HIGH (ref 70–99)

## 2012-11-08 LAB — CBC
MCHC: 33.1 g/dL (ref 30.0–36.0)
Platelets: 255 10*3/uL (ref 150–400)
RDW: 13.5 % (ref 11.5–15.5)

## 2012-11-08 LAB — URINE MICROSCOPIC-ADD ON

## 2012-11-08 LAB — URINALYSIS, ROUTINE W REFLEX MICROSCOPIC
Specific Gravity, Urine: 1.038 — ABNORMAL HIGH (ref 1.005–1.030)
pH: 7 (ref 5.0–8.0)

## 2012-11-08 MED ORDER — ENOXAPARIN SODIUM 40 MG/0.4ML ~~LOC~~ SOLN
40.0000 mg | SUBCUTANEOUS | Status: DC
  Administered 2012-11-09: 40 mg via SUBCUTANEOUS
  Filled 2012-11-08 (×3): qty 0.4

## 2012-11-08 MED ORDER — LISINOPRIL-HYDROCHLOROTHIAZIDE 20-12.5 MG PO TABS: 1.0000 | ORAL_TABLET | Freq: Every day | ORAL | Status: DC

## 2012-11-08 MED ORDER — ACETAMINOPHEN 325 MG PO TABS: 650.0000 mg | ORAL_TABLET | Freq: Four times a day (QID) | ORAL | Status: DC | PRN

## 2012-11-08 MED ORDER — LISINOPRIL 20 MG PO TABS
20.0000 mg | ORAL_TABLET | Freq: Every day | ORAL | Status: DC
  Administered 2012-11-08 – 2012-11-17 (×10): 20 mg via ORAL
  Filled 2012-11-08 (×10): qty 1

## 2012-11-08 MED ORDER — NAPROXEN 500 MG PO TABS
500.0000 mg | ORAL_TABLET | Freq: Three times a day (TID) | ORAL | Status: DC | PRN
  Filled 2012-11-08: qty 1

## 2012-11-08 MED ORDER — NICOTINE 21 MG/24HR TD PT24
21.0000 mg | MEDICATED_PATCH | Freq: Every day | TRANSDERMAL | Status: DC
  Administered 2012-11-08 – 2012-11-18 (×11): 21 mg via TRANSDERMAL
  Filled 2012-11-08 (×11): qty 1

## 2012-11-08 MED ORDER — INSULIN ASPART 100 UNIT/ML ~~LOC~~ SOLN
0.0000 [IU] | Freq: Three times a day (TID) | SUBCUTANEOUS | Status: DC
  Administered 2012-11-09: 2 [IU] via SUBCUTANEOUS
  Administered 2012-11-09 – 2012-11-11 (×5): 1 [IU] via SUBCUTANEOUS
  Administered 2012-11-13: 13:00:00 via SUBCUTANEOUS
  Administered 2012-11-13: 2 [IU] via SUBCUTANEOUS
  Administered 2012-11-13 – 2012-11-15 (×4): 1 [IU] via SUBCUTANEOUS
  Administered 2012-11-15 – 2012-11-17 (×2): 2 [IU] via SUBCUTANEOUS

## 2012-11-08 MED ORDER — HYDROCODONE-ACETAMINOPHEN 5-325 MG PO TABS
1.0000 | ORAL_TABLET | ORAL | Status: DC | PRN
  Administered 2012-11-08: 2 via ORAL
  Administered 2012-11-08 – 2012-11-09 (×6): 1 via ORAL
  Administered 2012-11-10: 2 via ORAL
  Administered 2012-11-10: 1 via ORAL
  Administered 2012-11-11: 2 via ORAL
  Administered 2012-11-11: 1 via ORAL
  Filled 2012-11-08: qty 1
  Filled 2012-11-08: qty 2
  Filled 2012-11-08 (×3): qty 1
  Filled 2012-11-08: qty 2
  Filled 2012-11-08: qty 1
  Filled 2012-11-08: qty 2
  Filled 2012-11-08 (×2): qty 1
  Filled 2012-11-08: qty 2

## 2012-11-08 MED ORDER — DEXAMETHASONE SODIUM PHOSPHATE 4 MG/ML IJ SOLN
4.0000 mg | Freq: Four times a day (QID) | INTRAMUSCULAR | Status: DC
  Administered 2012-11-08 – 2012-11-18 (×38): 4 mg via INTRAVENOUS
  Filled 2012-11-08 (×46): qty 1

## 2012-11-08 MED ORDER — POTASSIUM CHLORIDE CRYS ER 20 MEQ PO TBCR
40.0000 meq | EXTENDED_RELEASE_TABLET | Freq: Once | ORAL | Status: AC
  Administered 2012-11-08: 40 meq via ORAL
  Filled 2012-11-08: qty 2

## 2012-11-08 MED ORDER — NAPROXEN SODIUM 220 MG PO CAPS: 660.0000 mg | ORAL_CAPSULE | Freq: Three times a day (TID) | ORAL | Status: DC | PRN

## 2012-11-08 MED ORDER — ENOXAPARIN SODIUM 30 MG/0.3ML ~~LOC~~ SOLN
30.0000 mg | SUBCUTANEOUS | Status: DC
  Administered 2012-11-08: 30 mg via SUBCUTANEOUS
  Filled 2012-11-08 (×2): qty 0.3

## 2012-11-08 MED ORDER — SENNOSIDES-DOCUSATE SODIUM 8.6-50 MG PO TABS
2.0000 | ORAL_TABLET | Freq: Every day | ORAL | Status: DC
  Administered 2012-11-08 – 2012-11-13 (×5): 2 via ORAL
  Filled 2012-11-08 (×7): qty 2

## 2012-11-08 MED ORDER — HYDROCHLOROTHIAZIDE 12.5 MG PO CAPS
12.5000 mg | ORAL_CAPSULE | Freq: Every day | ORAL | Status: DC
  Administered 2012-11-08 – 2012-11-18 (×11): 12.5 mg via ORAL
  Filled 2012-11-08 (×11): qty 1

## 2012-11-08 MED ORDER — DEXAMETHASONE SODIUM PHOSPHATE 10 MG/ML IJ SOLN
12.0000 mg | Freq: Once | INTRAMUSCULAR | Status: AC
  Administered 2012-11-08: 12 mg via INTRAVENOUS
  Filled 2012-11-08: qty 2

## 2012-11-08 NOTE — Progress Notes (Signed)
Subjective: Patient unchanged.  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  This is the likely cause of her presenting symptoms.  Cervical spine MRI unremarkable.    Objective: Current vital signs: BP 95/55  Pulse 66  Temp 98.4 F (36.9 C) (Oral)  Resp 18  Ht 5' 8.5" (1.74 m)  Wt 93.532 kg (206 lb 3.2 oz)  BMI 30.90 kg/m2  SpO2 97%  LMP 11/07/2012 Vital signs in last 24 hours: Temp:  [98.3 F (36.8 C)-98.7 F (37.1 C)] 98.4 F (36.9 C) (12/06 0600) Pulse Rate:  [57-87] 66  (12/06 0600) Resp:  [16-18] 18  (12/06 0600) BP: (95-179)/(55-105) 95/55 mmHg (12/06 0600) SpO2:  [97 %-99 %] 97 % (12/06 0600) Weight:  [92.534 kg (204 lb)-93.532 kg (206 lb 3.2 oz)] 93.532 kg (206 lb 3.2 oz) (12/06 0222)  Intake/Output from previous day:   Intake/Output this shift:   Nutritional status: General  Neurologic Exam: Mental Status:  Alert, oriented, thought content appropriate. Speech fluent without evidence of aphasia. Able to follow commands without difficulty.  Cranial Nerves:  II-Visual fields were normal.  III/IV/VI-Pupils were equal and reacted. Extraocular movements were full and conjugate.  V/VII-no facial numbness and no facial weakness.  VIII-normal.  X-normal speech and symmetrical palatal movement.  XII-midline tongue extension  Motor: Normal strength proximally and distally of upper extremities; moderately severe weakness proximal and slight weakness distally of right lower extremity; severe weakness of left lower extremity proximally and distally; left foot drop with extension contracture at left ankle.  Sensory: Numbness involving both lower extremities proximally and distally as well as sensory level on the right at T11..  Deep Tendon Reflexes: 2+ and symmetric in the upper extremities; 4+ with sustained clonus at knees and ankles.  Plantars: Extensor bilaterally  Cerebellar: Normal finger-to-nose testing.     Lab Results: Basic Metabolic Panel:  Lab 11/08/12 1610 11/07/12 2116  NA 139 142  K 4.0 3.4*  CL 104 104  CO2 25 25  GLUCOSE 136* 98  BUN 14 13  CREATININE 0.71 0.73  CALCIUM 9.8 9.7  MG -- --  PHOS -- --    Liver Function Tests: No results found for this basename: AST:5,ALT:5,ALKPHOS:5,BILITOT:5,PROT:5,ALBUMIN:5 in the last 168 hours No results found for this basename: LIPASE:5,AMYLASE:5 in the last 168 hours No results found for this basename: AMMONIA:3 in the last 168 hours  CBC:  Lab 11/08/12 0609 11/07/12 2116  WBC 13.5* 11.9*  NEUTROABS -- 5.8  HGB 13.5 13.4  HCT 40.8 39.1  MCV 91.9 91.4  PLT 255 259    Cardiac Enzymes: No results found for this basename: CKTOTAL:5,CKMB:5,CKMBINDEX:5,TROPONINI:5 in the last 168 hours  Lipid Panel: No results found for this basename: CHOL:5,TRIG:5,HDL:5,CHOLHDL:5,VLDL:5,LDLCALC:5 in the last 168 hours  CBG: No results found for this basename: GLUCAP:5 in the last 168 hours  Microbiology: No results found for this or any previous visit.  Coagulation Studies: No results found for this basename: LABPROT:5,INR:5 in the last 72 hours  Imaging: Mr Cervical Spine Wo Contrast  11/08/2012  *RADIOLOGY REPORT*  Clinical Data:  36 year old female with back pain, bilateral lower extremity numbness and weakness.  Worsening bowel and bladder incontinence.  Possible transverse myelitis.  Contrast: 15 ml MultiHance.  Comparison: Lumbar MRI 11/07/2012 and earlier.  MRI CERVICAL SPINE WITHOUT AND WITH CONTRAST  Technique:  Multiplanar and multiecho pulse sequences of the cervic al spine, to include the craniocervical junction and cervicothoraci c junction,  were obtained according to standard protocol without and with intravenous contrast.  Findings:  Cervicomedullary junction is within normal limits. Normal cervical spinal cord.  See thoracic spinal cord abnormality below.  Normal cervical vertebral height and alignment. No marrow edema or  evidence of acute osseous abnormality.  Visualized paraspinal soft tissues are within normal limits.  Minor cervical degenerative changes.  No cervical disc herniation, spinal stenosis, or significant neural foraminal stenosis.  IMPRESSION: 1.  Negative cervical MRI. 2.  Abnormal thoracic MRI, see below.  MRI THORACIC SPINE WITHOUT AND WITH CONTRAST  Technique: Multiplanar and multiecho pulse sequences of the thoracic spine were obtained without and  with intravenous contrast.  Findings:  Abnormal thoracic spinal cord expansion from T4-T5 to T6- T7 appears to be the result of a an oblong intradural and mostly or completely intramedullary mass with intrinsic T1  hyperintensity and signal suppression on both STIR and fat saturated postcontrast T1 images.  The mass is about 5 cm in length and oval measuring up to 9 mm diameter.  There is evidence of mild cord edema immediately surrounding the lesion which extends to the mid T4 and mid T7 levels.  Following contrast there is little if any enhancement. The lesion might be slightly exophytic to the cord along the dorsal extent ( see series 15 image 19).  The epidural space at the level of the mass is within normal limits. No definite subarachnoid space abnormality.  There is no associated spinal dysraphism.  The other thoracic spinal cord levels are within normal limits. The conus was better visualized on the lumbar comparison but appears within normal limits.  No other areas of abnormal intradural enhancement.  Normal thoracic vertebral height, alignment, and marrow signal.Visualized paraspinal soft tissues are within normal limits.  Negative visualized thoracic and upper abdominal viscera.  IMPRESSION:  1.  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  Little if any enhancement.  Associated spinal cord expansion and mild cord edema.    No associated spinal dysraphism identified. 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  CT thoracic spine without contrast might be valuable to evaluate for any calcification within the mass (which would strongly indicate a dermoid). 2.  No other thoracic spine abnormality.  Study discussed with Dr. Thana Farr at [phone] hours on 11/08/2012.   Original Report Authenticated By: Erskine Speed, M.D.    Mr Lumbar Spine Wo Contrast  11/07/2012  *RADIOLOGY REPORT*  Clinical Data: Worsening bowel and bladder incontinence for the past week.  MRI LUMBAR SPINE WITHOUT CONTRAST  Technique:  Multiplanar and multiecho pulse sequences of the lumbar spine were obtained without intravenous contrast.  Comparison: 08/01/2012.  Findings: Last fully open disc space is labeled L5-S1.  Present examination incorporates from T10-11 disc space through the lower sacrum.  Conus L1 level.  No compression of the distal cord or conus.  No abnormal signal within the distal cord or conus.  Visualized paravertebral structures without gross abnormality. Evaluation slightly limited by respiratory motion.  No worrisome bony lesion.  T10-11:  Mild left-sided facet bony overgrowth.  T11-12:  Minimal left facet bony overgrowth.  T12-L1:  Negative.  L1-2:  Negative.  L2-3:  Very small left foraminal/lateral protrusion approaching but not compressing the exiting left L2 nerve root.  Minimal bulge. Very mild facet joint degenerative changes.  L3-4:  Prominent right-sided facet joint  degenerative changes containing fluid undermining the right ligamenta flava.  Mild left- sided facet joint degenerative changes.  Bulge with lateral extension approaching but not compressing the exiting L3 nerve roots.  Dorsal epidural fat.  Mild spinal stenosis  L4-5:  Mild facet joint degenerative changes greater on the right. Mild ligamentum flavum hypertrophy.  Bulge.  Left lateral disc osteophyte complex approaches but does not compress  the exiting left L4 nerve root.  Very mild spinal stenosis.  L5-S1:  No significant spinal stenosis or foraminal narrowing.  IMPRESSION: No conus compression detected as cause of patient's symptoms.  Scattered degenerative changes most notable at the L3-4 level as detailed above. Findings without significant change.   Original Report Authenticated By: Lacy Duverney, M.D.    Mr Thoracic Spine W Wo Contrast  11/08/2012  *RADIOLOGY REPORT*  Clinical Data:  36 year old female with back pain, bilateral lower extremity numbness and weakness.  Worsening bowel and bladder incontinence.  Possible transverse myelitis.  Contrast: 15 ml MultiHance.  Comparison: Lumbar MRI 11/07/2012 and earlier.  MRI CERVICAL SPINE WITHOUT AND WITH CONTRAST  Technique:  Multiplanar and multiecho pulse sequences of the cervic al spine, to include the craniocervical junction and cervicothoraci c junction, were obtained according to standard protocol without and with intravenous contrast.  Findings:  Cervicomedullary junction is within normal limits. Normal cervical spinal cord.  See thoracic spinal cord abnormality below.  Normal cervical vertebral height and alignment. No marrow edema or evidence of acute osseous abnormality.  Visualized paraspinal soft tissues are within normal limits.  Minor cervical degenerative changes.  No cervical disc herniation, spinal stenosis, or significant neural foraminal stenosis.  IMPRESSION: 1.  Negative cervical MRI. 2.  Abnormal thoracic MRI, see below.  MRI THORACIC SPINE WITHOUT AND WITH CONTRAST  Technique: Multiplanar and multiecho pulse sequences of the thoracic spine were obtained without and  with intravenous contrast.  Findings:  Abnormal thoracic spinal cord expansion from T4-T5 to T6- T7 appears to be the result of a an oblong intradural and mostly or completely intramedullary mass with intrinsic T1  hyperintensity and signal suppression on both STIR and fat saturated postcontrast T1 images.  The  mass is about 5 cm in length and oval measuring up to 9 mm diameter.  There is evidence of mild cord edema immediately surrounding the lesion which extends to the mid T4 and mid T7 levels.  Following contrast there is little if any enhancement. The lesion might be slightly exophytic to the cord along the dorsal extent ( see series 15 image 19).  The epidural space at the level of the mass is within normal limits. No definite subarachnoid space abnormality.  There is no associated spinal dysraphism.  The other thoracic spinal cord levels are within normal limits. The conus was better visualized on the lumbar comparison but appears within normal limits.  No other areas of abnormal intradural enhancement.  Normal thoracic vertebral height, alignment, and marrow signal.Visualized paraspinal soft tissues are within normal limits.  Negative visualized thoracic and upper abdominal viscera.  IMPRESSION:  1.  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  Little if any enhancement.  Associated spinal cord expansion and mild cord edema.   No associated spinal dysraphism identified. 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  CT thoracic spine without contrast might be valuable to evaluate for any calcification within the mass (  which would strongly indicate a dermoid). 2.  No other thoracic spine abnormality.  Study discussed with Dr. Thana Farr at [phone] hours on 11/08/2012.   Original Report Authenticated By: Erskine Speed, M.D.     Medications:  I have reviewed the patient's current medications. Scheduled:   . [COMPLETED] dexamethasone  12 mg Intravenous Once  . dexamethasone  4 mg Intravenous Q6H  . enoxaparin (LOVENOX) injection  30 mg Subcutaneous Q24H  . hydrochlorothiazide  12.5 mg Oral Daily  . lisinopril  20 mg Oral Daily  . [COMPLETED]  potassium chloride  40 mEq Oral Once  . [DISCONTINUED] lisinopril-hydrochlorothiazide  1 tablet Oral Daily    Assessment/Plan:  Patient Active Hospital Problem List:  Myelopathy (11/08/2012)   Assessment: Intradural mass identified.  Findings discussed with patient.   Plan: Neurosurgery consult.  Dr. Jordan Likes contacted.    LOS: 1 day   Thana Farr, MD [facility] [phone] 11/08/2012  9:45 AM

## 2012-11-08 NOTE — Progress Notes (Signed)
Patient seen and  examined this am. Admission H&P and PAs note reviewed and agree with plan.

## 2012-11-08 NOTE — Progress Notes (Signed)
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 Sherilyn Cooter Pool's consultation. Surgery planned for Tuesday after attempting to stabilize the cord with steroid therapy. Will start AC & HS cbgs and SSI as patient is starting high dose steroids. (no hx of dm)   Her other medical issues are stable:  HTN (hypertension)  BP variable (95 systolic to 170s systolic) Will continue with home meds (lisinopril and hctz) for now.  Hypokalemia  Resolved.  Repleted orally  Abdominal fullness and mild pain Will check u/a, give stool softeners  Code Status: Full Family Communication:  Disposition Plan: Per Neuro Surgery   Consultants:  Neurology  Neuro Surgery  Procedures:    Antibiotics:    HPI/Subjective: [name] describes a 6 month history of progressively worsening back and lower extremity pain.  Specifically she describes patchy areas of numbness from her thighs down. She also describes a sensation that her feet and ankles are swollen and that her abdomen is swollen. She describes night sweats and decreased appetite as well as constipation.  Objective: Filed Vitals:   11/08/12 0128 11/08/12 0222 11/08/12 0600 11/08/12 1000  BP: 134/94 147/86 95/55 150/83  Pulse: 69 57 66 73  Temp: 98.4 F (36.9 C) 98.3 F (36.8 C) 98.4 F (36.9 C)   TempSrc: Oral Oral    Resp: 16 18 18    Height: 5\' 8"  (1.727 m) 5' 8.5" (1.74 m)    Weight: 92.534 kg (204 lb) 93.532 kg (206 lb 3.2 oz)    SpO2: 99% 99% 97%    No intake or output data in the 24 hours ending 11/08/12 1058 Filed Weights   11/08/12 0128 11/08/12 0222  Weight: 92.534 kg (204 lb) 93.532 kg (206 lb 3.2 oz)    Exam:   General:  Alert and oriented, pleasant, no apparent  distress  Cardiovascular: regular rate and rhythm, no murmurs rubs or gallops, no lower extremity swelling  Respiratory: clear to auscultation, no accessory muscle movement  Abdomen: soft, slightly tender to palpation in the hypogastric area, nondistended positive bowel sounds, no masses  Neuro: Lower extremities are weak, with the left lower extremity being weaker than the right.  Data Reviewed: Basic Metabolic Panel:  Lab 11/08/12 8119 11/07/12 2116  NA 139 142  K 4.0 3.4*  CL 104 104  CO2 25 25  GLUCOSE 136* 98  BUN 14 13  CREATININE 0.71 0.73  CALCIUM 9.8 9.7  MG -- --  PHOS -- --    CBC:  Lab 11/08/12 0609 11/07/12 2116  WBC 13.5* 11.9*  NEUTROABS -- 5.8  HGB 13.5 13.4  HCT 40.8 39.1  MCV 91.9 91.4  PLT 255 259     Studies: Mr Cervical Spine Wo Contrast  11/08/2012  *RADIOLOGY REPORT*  Clinical Data:  36 year old female with back pain, bilateral lower extremity numbness and weakness.  Worsening bowel and bladder incontinence.  Possible transverse myelitis.  Contrast: 15 ml MultiHance.  Comparison: Lumbar MRI 11/07/2012 and earlier.  MRI CERVICAL SPINE WITHOUT AND WITH CONTRAST  Technique:  Multiplanar and multiecho pulse sequences of the cervic al spine, to include the craniocervical junction and cervicothoraci c junction, were obtained according to standard protocol without and with intravenous contrast.  Findings:  Cervicomedullary junction is within normal limits. Normal cervical spinal cord.  See thoracic spinal cord  abnormality below.  Normal cervical vertebral height and alignment. No marrow edema or evidence of acute osseous abnormality.  Visualized paraspinal soft tissues are within normal limits.  Minor cervical degenerative changes.  No cervical disc herniation, spinal stenosis, or significant neural foraminal stenosis.  IMPRESSION: 1.  Negative cervical MRI. 2.  Abnormal thoracic MRI, see below.  MRI THORACIC SPINE WITHOUT AND WITH CONTRAST  Technique:  Multiplanar and multiecho pulse sequences of the thoracic spine were obtained without and  with intravenous contrast.  Findings:  Abnormal thoracic spinal cord expansion from T4-T5 to T6- T7 appears to be the result of a an oblong intradural and mostly or completely intramedullary mass with intrinsic T1  hyperintensity and signal suppression on both STIR and fat saturated postcontrast T1 images.  The mass is about 5 cm in length and oval measuring up to 9 mm diameter.  There is evidence of mild cord edema immediately surrounding the lesion which extends to the mid T4 and mid T7 levels.  Following contrast there is little if any enhancement. The lesion might be slightly exophytic to the cord along the dorsal extent ( see series 15 image 19).  The epidural space at the level of the mass is within normal limits. No definite subarachnoid space abnormality.  There is no associated spinal dysraphism.  The other thoracic spinal cord levels are within normal limits. The conus was better visualized on the lumbar comparison but appears within normal limits.  No other areas of abnormal intradural enhancement.  Normal thoracic vertebral height, alignment, and marrow signal.Visualized paraspinal soft tissues are within normal limits.  Negative visualized thoracic and upper abdominal viscera.  IMPRESSION:  1.  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  Little if any enhancement.  Associated spinal cord expansion and mild cord edema.   No associated spinal dysraphism identified. 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  CT thoracic spine without contrast might be valuable to evaluate for any calcification within the mass (which would strongly indicate a dermoid). 2.  No other thoracic spine abnormality.  Study discussed with Dr. Thana Farr at [phone] hours on  11/08/2012.   Original Report Authenticated By: Erskine Speed, M.D.    Mr Lumbar Spine Wo Contrast  11/07/2012  *RADIOLOGY REPORT*  Clinical Data: Worsening bowel and bladder incontinence for the past week.  MRI LUMBAR SPINE WITHOUT CONTRAST  Technique:  Multiplanar and multiecho pulse sequences of the lumbar spine were obtained without intravenous contrast.  Comparison: 08/01/2012.  Findings: Last fully open disc space is labeled L5-S1.  Present examination incorporates from T10-11 disc space through the lower sacrum.  Conus L1 level.  No compression of the distal cord or conus.  No abnormal signal within the distal cord or conus.  Visualized paravertebral structures without gross abnormality. Evaluation slightly limited by respiratory motion.  No worrisome bony lesion.  T10-11:  Mild left-sided facet bony overgrowth.  T11-12:  Minimal left facet bony overgrowth.  T12-L1:  Negative.  L1-2:  Negative.  L2-3:  Very small left foraminal/lateral protrusion approaching but not compressing the exiting left L2 nerve root.  Minimal bulge. Very mild facet joint degenerative changes.  L3-4:  Prominent right-sided facet joint degenerative changes containing fluid undermining the right ligamenta flava.  Mild left- sided facet joint degenerative changes.  Bulge with lateral extension approaching but not compressing the exiting L3  nerve roots.  Dorsal epidural fat.  Mild spinal stenosis  L4-5:  Mild facet joint degenerative changes greater on the right. Mild ligamentum flavum hypertrophy.  Bulge.  Left lateral disc osteophyte complex approaches but does not compress the exiting left L4 nerve root.  Very mild spinal stenosis.  L5-S1:  No significant spinal stenosis or foraminal narrowing.  IMPRESSION: No conus compression detected as cause of patient's symptoms.  Scattered degenerative changes most notable at the L3-4 level as detailed above. Findings without significant change.   Original Report Authenticated By: Lacy Duverney,  M.D.    Mr Thoracic Spine W Wo Contrast  11/08/2012  *RADIOLOGY REPORT*  Clinical Data:  36 year old female with back pain, bilateral lower extremity numbness and weakness.  Worsening bowel and bladder incontinence.  Possible transverse myelitis.  Contrast: 15 ml MultiHance.  Comparison: Lumbar MRI 11/07/2012 and earlier.  MRI CERVICAL SPINE WITHOUT AND WITH CONTRAST  Technique:  Multiplanar and multiecho pulse sequences of the cervic al spine, to include the craniocervical junction and cervicothoraci c junction, were obtained according to standard protocol without and with intravenous contrast.  Findings:  Cervicomedullary junction is within normal limits. Normal cervical spinal cord.  See thoracic spinal cord abnormality below.  Normal cervical vertebral height and alignment. No marrow edema or evidence of acute osseous abnormality.  Visualized paraspinal soft tissues are within normal limits.  Minor cervical degenerative changes.  No cervical disc herniation, spinal stenosis, or significant neural foraminal stenosis.  IMPRESSION: 1.  Negative cervical MRI. 2.  Abnormal thoracic MRI, see below.  MRI THORACIC SPINE WITHOUT AND WITH CONTRAST  Technique: Multiplanar and multiecho pulse sequences of the thoracic spine were obtained without and  with intravenous contrast.  Findings:  Abnormal thoracic spinal cord expansion from T4-T5 to T6- T7 appears to be the result of a an oblong intradural and mostly or completely intramedullary mass with intrinsic T1  hyperintensity and signal suppression on both STIR and fat saturated postcontrast T1 images.  The mass is about 5 cm in length and oval measuring up to 9 mm diameter.  There is evidence of mild cord edema immediately surrounding the lesion which extends to the mid T4 and mid T7 levels.  Following contrast there is little if any enhancement. The lesion might be slightly exophytic to the cord along the dorsal extent ( see series 15 image 19).  The epidural space at  the level of the mass is within normal limits. No definite subarachnoid space abnormality.  There is no associated spinal dysraphism.  The other thoracic spinal cord levels are within normal limits. The conus was better visualized on the lumbar comparison but appears within normal limits.  No other areas of abnormal intradural enhancement.  Normal thoracic vertebral height, alignment, and marrow signal.Visualized paraspinal soft tissues are within normal limits.  Negative visualized thoracic and upper abdominal viscera.  IMPRESSION:  1.  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  Little if any enhancement.  Associated spinal cord expansion and mild cord edema.   No associated spinal dysraphism identified. 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  CT thoracic spine without contrast might be valuable to evaluate for any calcification within the mass (which would strongly indicate a dermoid). 2.  No other thoracic spine abnormality.  Study discussed with Dr. Thana Farr at [phone] hours on 11/08/2012.   Original Report  Authenticated By: Erskine Speed, M.D.     Scheduled Meds:   . [COMPLETED] dexamethasone  12 mg Intravenous Once  . dexamethasone  4 mg Intravenous Q6H  . enoxaparin (LOVENOX) injection  30 mg Subcutaneous Q24H  . hydrochlorothiazide  12.5 mg Oral Daily  . lisinopril  20 mg Oral Daily  . nicotine  21 mg Transdermal Daily  . [COMPLETED] potassium chloride  40 mEq Oral Once  . [DISCONTINUED] lisinopril-hydrochlorothiazide  1 tablet Oral Daily   Continuous Infusions:   Principal Problem:  *Lower extremity weakness Active Problems:  Hypokalemia  HTN (hypertension)  Myelopathy  Paraparesis of both lower limbs    Time spent: 30 minutes    Stephani Police  [facility] Pager [phone]. If 8PM-8AM, please  contact night-coverage at [email or web address], password [facility] 11/08/2012, 10:58 AM  LOS: 1 day

## 2012-11-08 NOTE — Progress Notes (Signed)
PT Cancellation Note  Patient Details Name: Debbie [name] MRN: [id number] DOB: 02/03/1976   Cancelled Treatment:    Reason Eval/Treat Not Completed: Other (comment) Pt with thoracic lesion and likely for resection early next week. Due to lesion unlikely Physical Therapy would have a beneficial effect on pts mobility until its removed.   PT will sign off for now and await reorder if necessary after surgery.     Donnella Sham 11/08/2012, 11:15 AM [place], PT   11/08/2012

## 2012-11-08 NOTE — Consult Note (Signed)
NEURO HOSPITALIST CONSULT NOTE    Reason for Consult: Progressive and numbness of lower extremities and recent onset of incontinence.  HPI:                                                                                                                                          Debbie [name] is an 36 y.o. female with a history of hypertension who presented to the emergency room with complaint of increasing weakness and numbness of lower extremities over the past 6 months with increasing difficulty with standing and walking and recurrent falls. About one week ago she developed incontinence of urine and bowel. She's noticed spasms involving lower extremities, left greater than right. She's also had back and hip pain intermittently. She had an MRI study of her lumbar spine in August 2013 and again tonight. Studies have not shown a significant abnormality. She's had no other MRI studies of her spine. She's had no symptoms involving her upper extremities.  Past Medical History  Diagnosis Date  . Hypertension     Past Surgical History  Procedure Date  . Fracture surgery   . Cesarean section     History reviewed. No pertinent family history.   Social History:  reports that she has been smoking.  She does not have any smokeless tobacco history on file. She reports that she drinks alcohol. She reports that she does not use illicit drugs.  Allergies  Allergen Reactions  . Honey Bee Treatment (Bee Venom) Anaphylaxis and Itching    MEDICATIONS:                                                                                                                     Prior to Admission:  Tylenol 2000 mg every 6 hours when necessary Ibuprofen [phone] mg every 6 hours when necessary Prinzide 20-12.5 one tablet daily Aleve 660 mg 3 times a day when necessary  Blood pressure 169/99, pulse 87, temperature 98.7 F (37.1 C), temperature source Oral, resp. rate 16, last menstrual period  11/02/2012, SpO2 98.00%.  Neurologic Examination:  Mental Status: Alert, oriented, thought content appropriate.  Speech fluent without evidence of aphasia. Able to follow commands without difficulty. Cranial Nerves: II-Visual fields were normal. III/IV/VI-Pupils were equal and reacted. Extraocular movements were full and conjugate.    V/VII-no facial numbness and no facial weakness. VIII-normal. X-normal speech and symmetrical palatal movement. XII-midline tongue extension Motor: Normal strength proximally and distally of upper extremities; moderately severe weakness proximal and slight weakness distally of right lower extremity; severe weakness of left lower extremity proximally and distally; left foot drop with extension contracture at left ankle. Sensory: Numbness involving both lower extremities proximally and distally as well as sensory level on the right at T11.. Deep Tendon Reflexes: 2+ and symmetric in the upper extremities; 4+ with sustained clonus at knees and ankles. Plantars: Extensor bilaterally Cerebellar: Normal finger-to-nose testing.  No results found for this basename: cbc, bmp, coags, chol, tri, ldl, hga1c    Results for orders placed during the hospital encounter of 11/07/12 (from the past 48 hour(s))  CBC WITH DIFFERENTIAL     Status: Abnormal   Collection Time   11/07/12  9:16 PM      Component Value Range Comment   WBC 11.9 (*) 4.0 - 10.5 K/uL    RBC 4.28  3.87 - 5.11 MIL/uL    Hemoglobin 13.4  12.0 - 15.0 g/dL    HCT 16.1  09.6 - 04.5 %    MCV 91.4  78.0 - 100.0 fL    MCH 31.3  26.0 - 34.0 pg    MCHC 34.3  30.0 - 36.0 g/dL    RDW 40.9  81.1 - 91.4 %    Platelets 259  150 - 400 K/uL    Neutrophils Relative 49  43 - 77 %    Neutro Abs 5.8  1.7 - 7.7 K/uL    Lymphocytes Relative 40  12 - 46 %    Lymphs Abs 4.8 (*) 0.7 - 4.0 K/uL    Monocytes Relative 8  3 - 12  %    Monocytes Absolute 0.9  0.1 - 1.0 K/uL    Eosinophils Relative 3  0 - 5 %    Eosinophils Absolute 0.3  0.0 - 0.7 K/uL    Basophils Relative 0  0 - 1 %    Basophils Absolute 0.1  0.0 - 0.1 K/uL   BASIC METABOLIC PANEL     Status: Abnormal   Collection Time   11/07/12  9:16 PM      Component Value Range Comment   Sodium 142  135 - 145 mEq/L    Potassium 3.4 (*) 3.5 - 5.1 mEq/L    Chloride 104  96 - 112 mEq/L    CO2 25  19 - 32 mEq/L    Glucose, Bld 98  70 - 99 mg/dL    BUN 13  6 - 23 mg/dL    Creatinine, Ser 7.82  0.50 - 1.10 mg/dL    Calcium 9.7  8.4 - 95.6 mg/dL    GFR calc non Af Amer >90  >90 mL/min    GFR calc Af Amer >90  >90 mL/min     Mr Lumbar Spine Wo Contrast  11/07/2012  *RADIOLOGY REPORT*  Clinical Data: Worsening bowel and bladder incontinence for the past week.  MRI LUMBAR SPINE WITHOUT CONTRAST  Technique:  Multiplanar and multiecho pulse sequences of the lumbar spine were obtained without intravenous contrast.  Comparison: 08/01/2012.  Findings: Last fully open disc space is labeled L5-S1.  Present examination  incorporates from T10-11 disc space through the lower sacrum.  Conus L1 level.  No compression of the distal cord or conus.  No abnormal signal within the distal cord or conus.  Visualized paravertebral structures without gross abnormality. Evaluation slightly limited by respiratory motion.  No worrisome bony lesion.  T10-11:  Mild left-sided facet bony overgrowth.  T11-12:  Minimal left facet bony overgrowth.  T12-L1:  Negative.  L1-2:  Negative.  L2-3:  Very small left foraminal/lateral protrusion approaching but not compressing the exiting left L2 nerve root.  Minimal bulge. Very mild facet joint degenerative changes.  L3-4:  Prominent right-sided facet joint degenerative changes containing fluid undermining the right ligamenta flava.  Mild left- sided facet joint degenerative changes.  Bulge with lateral extension approaching but not compressing the exiting L3  nerve roots.  Dorsal epidural fat.  Mild spinal stenosis  L4-5:  Mild facet joint degenerative changes greater on the right. Mild ligamentum flavum hypertrophy.  Bulge.  Left lateral disc osteophyte complex approaches but does not compress the exiting left L4 nerve root.  Very mild spinal stenosis.  L5-S1:  No significant spinal stenosis or foraminal narrowing.  IMPRESSION: No conus compression detected as cause of patient's symptoms.  Scattered degenerative changes most notable at the L3-4 level as detailed above. Findings without significant change.   Original Report Authenticated By: Lacy Duverney, M.D.    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 Extrinsic lesion is most likely with either mass or ruptured disc. Intrinsic cord lesion, however, cannot be ruled out at this point. Progressive nature of her deficits favors mass lesion.  Plan: 1. Thoracic spine MRI. 2. Decadron 12 mg IV loading dose followed by 4 mg every 6 hours. 3. Neurosurgery consult.  Venetia Maxon M.D. [facility] [phone]  11/08/2012, 12:39 AM

## 2012-11-08 NOTE — Consult Note (Signed)
Reason for Consult: Spinal cord tumor Referring Physician: Dr. Thana Farr  Debbie [name] is an 36 y.o. female.  HPI: 36 year old female who has a six-month history of progressive back and lower extremity pain, sensory loss, and weakness presents with increasing difficulty with ambulation and a 1 week history of intermittent urinary incontinence. Patient with no history of malignancy. No history of infection or any current evidence of infection. Patient is still able to ambulate but is suffering some intermittent falls. She still has the sensation of bladder fullness but has difficulty holding her urine.  Past Medical History  Diagnosis Date  . Hypertension     Past Surgical History  Procedure Date  . Fracture surgery   . Cesarean section     History reviewed. No pertinent family history.  Social History:  reports that she has been smoking Cigarettes.  She has a 15 pack-year smoking history. She does not have any smokeless tobacco history on file. She reports that she drinks alcohol. She reports that she does not use illicit drugs.  Allergies:  Allergies  Allergen Reactions  . Honey Bee Treatment (Bee Venom) Anaphylaxis and Itching    Medications: I have reviewed the patient's current medications.  Results for orders placed during the hospital encounter of 11/07/12 (from the past 48 hour(s))  CBC WITH DIFFERENTIAL     Status: Abnormal   Collection Time   11/07/12  9:16 PM      Component Value Range Comment   WBC 11.9 (*) 4.0 - 10.5 K/uL    RBC 4.28  3.87 - 5.11 MIL/uL    Hemoglobin 13.4  12.0 - 15.0 g/dL    HCT 57.8  46.9 - 62.9 %    MCV 91.4  78.0 - 100.0 fL    MCH 31.3  26.0 - 34.0 pg    MCHC 34.3  30.0 - 36.0 g/dL    RDW 52.8  41.3 - 24.4 %    Platelets 259  150 - 400 K/uL    Neutrophils Relative 49  43 - 77 %    Neutro Abs 5.8  1.7 - 7.7 K/uL    Lymphocytes Relative 40  12 - 46 %    Lymphs Abs 4.8 (*) 0.7 - 4.0 K/uL    Monocytes Relative 8  3 - 12 %     Monocytes Absolute 0.9  0.1 - 1.0 K/uL    Eosinophils Relative 3  0 - 5 %    Eosinophils Absolute 0.3  0.0 - 0.7 K/uL    Basophils Relative 0  0 - 1 %    Basophils Absolute 0.1  0.0 - 0.1 K/uL   BASIC METABOLIC PANEL     Status: Abnormal   Collection Time   11/07/12  9:16 PM      Component Value Range Comment   Sodium 142  135 - 145 mEq/L    Potassium 3.4 (*) 3.5 - 5.1 mEq/L    Chloride 104  96 - 112 mEq/L    CO2 25  19 - 32 mEq/L    Glucose, Bld 98  70 - 99 mg/dL    BUN 13  6 - 23 mg/dL    Creatinine, Ser 0.10  0.50 - 1.10 mg/dL    Calcium 9.7  8.4 - 27.2 mg/dL    GFR calc non Af Amer >90  >90 mL/min    GFR calc Af Amer >90  >90 mL/min   BASIC METABOLIC PANEL     Status: Abnormal  Collection Time   11/08/12  6:09 AM      Component Value Range Comment   Sodium 139  135 - 145 mEq/L    Potassium 4.0  3.5 - 5.1 mEq/L    Chloride 104  96 - 112 mEq/L    CO2 25  19 - 32 mEq/L    Glucose, Bld 136 (*) 70 - 99 mg/dL    BUN 14  6 - 23 mg/dL    Creatinine, Ser 4.54  0.50 - 1.10 mg/dL    Calcium 9.8  8.4 - 09.8 mg/dL    GFR calc non Af Amer >90  >90 mL/min    GFR calc Af Amer >90  >90 mL/min   CBC     Status: Abnormal   Collection Time   11/08/12  6:09 AM      Component Value Range Comment   WBC 13.5 (*) 4.0 - 10.5 K/uL    RBC 4.44  3.87 - 5.11 MIL/uL    Hemoglobin 13.5  12.0 - 15.0 g/dL    HCT 11.9  14.7 - 82.9 %    MCV 91.9  78.0 - 100.0 fL    MCH 30.4  26.0 - 34.0 pg    MCHC 33.1  30.0 - 36.0 g/dL    RDW 56.2  13.0 - 86.5 %    Platelets 255  150 - 400 K/uL     Mr Cervical Spine Wo Contrast  11/08/2012  *RADIOLOGY REPORT*  Clinical Data:  36 year old female with back pain, bilateral lower extremity numbness and weakness.  Worsening bowel and bladder incontinence.  Possible transverse myelitis.  Contrast: 15 ml MultiHance.  Comparison: Lumbar MRI 11/07/2012 and earlier.  MRI CERVICAL SPINE WITHOUT AND WITH CONTRAST  Technique:  Multiplanar and multiecho pulse sequences of the  cervic al spine, to include the craniocervical junction and cervicothoraci c junction, were obtained according to standard protocol without and with intravenous contrast.  Findings:  Cervicomedullary junction is within normal limits. Normal cervical spinal cord.  See thoracic spinal cord abnormality below.  Normal cervical vertebral height and alignment. No marrow edema or evidence of acute osseous abnormality.  Visualized paraspinal soft tissues are within normal limits.  Minor cervical degenerative changes.  No cervical disc herniation, spinal stenosis, or significant neural foraminal stenosis.  IMPRESSION: 1.  Negative cervical MRI. 2.  Abnormal thoracic MRI, see below.  MRI THORACIC SPINE WITHOUT AND WITH CONTRAST  Technique: Multiplanar and multiecho pulse sequences of the thoracic spine were obtained without and  with intravenous contrast.  Findings:  Abnormal thoracic spinal cord expansion from T4-T5 to T6- T7 appears to be the result of a an oblong intradural and mostly or completely intramedullary mass with intrinsic T1  hyperintensity and signal suppression on both STIR and fat saturated postcontrast T1 images.  The mass is about 5 cm in length and oval measuring up to 9 mm diameter.  There is evidence of mild cord edema immediately surrounding the lesion which extends to the mid T4 and mid T7 levels.  Following contrast there is little if any enhancement. The lesion might be slightly exophytic to the cord along the dorsal extent ( see series 15 image 19).  The epidural space at the level of the mass is within normal limits. No definite subarachnoid space abnormality.  There is no associated spinal dysraphism.  The other thoracic spinal cord levels are within normal limits. The conus was better visualized on the lumbar comparison but appears within normal limits.  No  other areas of abnormal intradural enhancement.  Normal thoracic vertebral height, alignment, and marrow signal.Visualized paraspinal soft  tissues are within normal limits.  Negative visualized thoracic and upper abdominal viscera.  IMPRESSION:  1.  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  Little if any enhancement.  Associated spinal cord expansion and mild cord edema.   No associated spinal dysraphism identified. 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  CT thoracic spine without contrast might be valuable to evaluate for any calcification within the mass (which would strongly indicate a dermoid). 2.  No other thoracic spine abnormality.  Study discussed with Dr. Thana Farr at [phone] hours on 11/08/2012.   Original Report Authenticated By: Erskine Speed, M.D.    Mr Lumbar Spine Wo Contrast  11/07/2012  *RADIOLOGY REPORT*  Clinical Data: Worsening bowel and bladder incontinence for the past week.  MRI LUMBAR SPINE WITHOUT CONTRAST  Technique:  Multiplanar and multiecho pulse sequences of the lumbar spine were obtained without intravenous contrast.  Comparison: 08/01/2012.  Findings: Last fully open disc space is labeled L5-S1.  Present examination incorporates from T10-11 disc space through the lower sacrum.  Conus L1 level.  No compression of the distal cord or conus.  No abnormal signal within the distal cord or conus.  Visualized paravertebral structures without gross abnormality. Evaluation slightly limited by respiratory motion.  No worrisome bony lesion.  T10-11:  Mild left-sided facet bony overgrowth.  T11-12:  Minimal left facet bony overgrowth.  T12-L1:  Negative.  L1-2:  Negative.  L2-3:  Very small left foraminal/lateral protrusion approaching but not compressing the exiting left L2 nerve root.  Minimal bulge. Very mild facet joint degenerative changes.  L3-4:  Prominent right-sided facet joint degenerative changes containing fluid undermining the right ligamenta  flava.  Mild left- sided facet joint degenerative changes.  Bulge with lateral extension approaching but not compressing the exiting L3 nerve roots.  Dorsal epidural fat.  Mild spinal stenosis  L4-5:  Mild facet joint degenerative changes greater on the right. Mild ligamentum flavum hypertrophy.  Bulge.  Left lateral disc osteophyte complex approaches but does not compress the exiting left L4 nerve root.  Very mild spinal stenosis.  L5-S1:  No significant spinal stenosis or foraminal narrowing.  IMPRESSION: No conus compression detected as cause of patient's symptoms.  Scattered degenerative changes most notable at the L3-4 level as detailed above. Findings without significant change.   Original Report Authenticated By: Lacy Duverney, M.D.    Mr Thoracic Spine W Wo Contrast  11/08/2012  *RADIOLOGY REPORT*  Clinical Data:  36 year old female with back pain, bilateral lower extremity numbness and weakness.  Worsening bowel and bladder incontinence.  Possible transverse myelitis.  Contrast: 15 ml MultiHance.  Comparison: Lumbar MRI 11/07/2012 and earlier.  MRI CERVICAL SPINE WITHOUT AND WITH CONTRAST  Technique:  Multiplanar and multiecho pulse sequences of the cervic al spine, to include the craniocervical junction and cervicothoraci c junction, were obtained according to standard protocol without and with intravenous contrast.  Findings:  Cervicomedullary junction is within normal limits. Normal cervical spinal cord.  See thoracic spinal cord abnormality below.  Normal cervical vertebral height and alignment. No marrow edema or evidence of acute osseous abnormality.  Visualized paraspinal soft tissues are within normal limits.  Minor cervical degenerative changes.  No cervical disc herniation, spinal stenosis, or significant neural foraminal stenosis.  IMPRESSION: 1.  Negative cervical MRI. 2.  Abnormal thoracic MRI, see below.  MRI THORACIC SPINE WITHOUT AND WITH CONTRAST  Technique: Multiplanar and multiecho  pulse sequences of the thoracic spine were obtained without and  with intravenous contrast.  Findings:  Abnormal thoracic spinal cord expansion from T4-T5 to T6- T7 appears to be the result of a an oblong intradural and mostly or completely intramedullary mass with intrinsic T1  hyperintensity and signal suppression on both STIR and fat saturated postcontrast T1 images.  The mass is about 5 cm in length and oval measuring up to 9 mm diameter.  There is evidence of mild cord edema immediately surrounding the lesion which extends to the mid T4 and mid T7 levels.  Following contrast there is little if any enhancement. The lesion might be slightly exophytic to the cord along the dorsal extent ( see series 15 image 19).  The epidural space at the level of the mass is within normal limits. No definite subarachnoid space abnormality.  There is no associated spinal dysraphism.  The other thoracic spinal cord levels are within normal limits. The conus was better visualized on the lumbar comparison but appears within normal limits.  No other areas of abnormal intradural enhancement.  Normal thoracic vertebral height, alignment, and marrow signal.Visualized paraspinal soft tissues are within normal limits.  Negative visualized thoracic and upper abdominal viscera.  IMPRESSION:  1.  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  Little if any enhancement.  Associated spinal cord expansion and mild cord edema.   No associated spinal dysraphism identified. 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  CT thoracic spine without contrast might be valuable to evaluate for any calcification within the mass (which would strongly indicate a dermoid). 2.  No other thoracic spine abnormality.  Study discussed with Dr. Thana Farr at [phone] hours on 11/08/2012.   Original  Report Authenticated By: Erskine Speed, M.D.     A comprehensive review of systems was negative. Blood pressure 95/55, pulse 66, temperature 98.4 F (36.9 C), temperature source Oral, resp. rate 18, height 5' 8.5" (1.74 m), weight 93.532 kg (206 lb 3.2 oz), last menstrual period 11/07/2012, SpO2 97.00%. Patient is awake and alert. She is oriented and appropriate. Cranial nerve function is intact. Motor and sensory function of the upper trimming these is normal. Motor examination of the lower extremities reveals increased tone. Spontaneous clonus. And confused spastic paraparesis grating out at 4 minus over 5 somewhat worse on the left relative to the right area patient has a relative sensory level around T10 bilaterally. She is markedly hyperreflexic in both lower trimming these with the aforementioned clonus.  There is no evidence of any cutaneous lesions along her spine. Her spine itself is nontender with no evidence of bony abnormality. Remainder of her examination is unremarkable.  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 Imaging not classic for a typical neoplasm such as an ependymoma or astrocytoma. 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 The patient will require surgical exploration and debulking and/or resection of this lesion in a semiurgent fashion. Prior to surgery however I like patient to be treated with steroids in hopes of stabilizing the cord around this lesion to hopefully lessen the chance  of further injury caused by the surgery itself. With that in mind I like patient be placed on high dose Decadron through the weekend and I will tentatively make plans for surgery on Monday. I've discussed the risks and benefits involved with a thoracic laminectomy and resection of intradural intramedullary tumor with the patient and her family. She is aware the  possible risks including but not limited to risk of anesthesia, bleeding, infection, nerve root or spinal cord injury, tumor recurrence, CSF leak, continued pain and non-benefit. She's been given the opportunity as questions and appears to understand. She agrees to proceed with surgery.  Kambrey Hagger A 11/08/2012, 10:08 AM

## 2012-11-09 DIAGNOSIS — R29898 Other symptoms and signs involving the musculoskeletal system: Secondary | ICD-10-CM

## 2012-11-09 DIAGNOSIS — G9520 Unspecified cord compression: Secondary | ICD-10-CM | POA: Diagnosis present

## 2012-11-09 LAB — GLUCOSE, CAPILLARY
Glucose-Capillary: 141 mg/dL — ABNORMAL HIGH (ref 70–99)
Glucose-Capillary: 101 mg/dL — ABNORMAL HIGH (ref 70–99)
Glucose-Capillary: 150 mg/dL — ABNORMAL HIGH (ref 70–99)

## 2012-11-09 MED ORDER — PANTOPRAZOLE SODIUM 40 MG PO TBEC
40.0000 mg | DELAYED_RELEASE_TABLET | Freq: Every day | ORAL | Status: DC
  Administered 2012-11-09 – 2012-11-18 (×9): 40 mg via ORAL
  Filled 2012-11-09 (×9): qty 1

## 2012-11-09 MED ORDER — POLYETHYLENE GLYCOL 3350 17 G PO PACK
17.0000 g | PACK | Freq: Every day | ORAL | Status: DC
  Administered 2012-11-09 – 2012-11-14 (×5): 17 g via ORAL
  Filled 2012-11-09 (×7): qty 1

## 2012-11-09 NOTE — Progress Notes (Signed)
Subjective: Patient reports that she feels somewhat stronger and is having less shaking in her lower extremities.  Has spoken with neurosurgery and is scheduled for resection on Monday.    Objective: Current vital signs: BP 134/84  Pulse 73  Temp 97 F (36.1 C) (Oral)  Resp 20  Ht 5' 8.5" (1.74 m)  Wt 93.532 kg (206 lb 3.2 oz)  BMI 30.90 kg/m2  SpO2 94%  LMP 11/07/2012 Vital signs in last 24 hours: Temp:  [97 F (36.1 C)-98.9 F (37.2 C)] 97 F (36.1 C) (12/07 0426) Pulse Rate:  [63-80] 73  (12/07 1019) Resp:  [17-20] 20  (12/07 0426) BP: (117-149)/(75-88) 134/84 mmHg (12/07 1019) SpO2:  [93 %-96 %] 94 % (12/07 0426)  Intake/Output from previous day: 12/06 0701 - 12/07 0700 In: 720 [P.O.:720] Out: -  Intake/Output this shift:   Nutritional status: General  Neurologic Exam: Mental Status:  Alert, oriented, thought content appropriate. Speech fluent without evidence of aphasia. Able to follow commands without difficulty.  Cranial Nerves:  II-Visual fields were normal.  III/IV/VI-Pupils were equal and reacted. Extraocular movements were full and conjugate.  V/VII-no facial numbness and no facial weakness.  VIII-normal.  X-normal speech and symmetrical palatal movement.  XII-midline tongue extension  Motor:  3/5 strength of the LLE with foot drop.  4-/5 RLE.   Sensory: Numbness involving both lower extremities proximally and distally as well as sensory level on the right at T11..  Deep Tendon Reflexes: 2+ and symmetric in the upper extremities; 4+ with sustained clonus at knees and ankles.  Plantars: Extensor bilaterally  Cerebellar: Normal finger-to-nose testing.    Lab Results: Basic Metabolic Panel:  Lab 11/08/12 1610 11/07/12 2116  NA 139 142  K 4.0 3.4*  CL 104 104  CO2 25 25  GLUCOSE 136* 98  BUN 14 13  CREATININE 0.71 0.73  CALCIUM 9.8 9.7  MG -- --  PHOS -- --    Liver Function Tests: No results found for this basename:  AST:5,ALT:5,ALKPHOS:5,BILITOT:5,PROT:5,ALBUMIN:5 in the last 168 hours No results found for this basename: LIPASE:5,AMYLASE:5 in the last 168 hours No results found for this basename: AMMONIA:3 in the last 168 hours  CBC:  Lab 11/08/12 0609 11/07/12 2116  WBC 13.5* 11.9*  NEUTROABS -- 5.8  HGB 13.5 13.4  HCT 40.8 39.1  MCV 91.9 91.4  PLT 255 259    Cardiac Enzymes: No results found for this basename: CKTOTAL:5,CKMB:5,CKMBINDEX:5,TROPONINI:5 in the last 168 hours  Lipid Panel: No results found for this basename: CHOL:5,TRIG:5,HDL:5,CHOLHDL:5,VLDL:5,LDLCALC:5 in the last 168 hours  CBG:  Lab 11/09/12 0805 11/08/12 2050 11/08/12 1725 11/08/12 1147  GLUCAP 141* 198* 120* 148*    Microbiology: No results found for this or any previous visit.  Coagulation Studies: No results found for this basename: LABPROT:5,INR:5 in the last 72 hours  Imaging: Mr Cervical Spine Wo Contrast  11/08/2012  *RADIOLOGY REPORT*  Clinical Data:  36 year old female with back pain, bilateral lower extremity numbness and weakness.  Worsening bowel and bladder incontinence.  Possible transverse myelitis.  Contrast: 15 ml MultiHance.  Comparison: Lumbar MRI 11/07/2012 and earlier.  MRI CERVICAL SPINE WITHOUT AND WITH CONTRAST  Technique:  Multiplanar and multiecho pulse sequences of the cervic al spine, to include the craniocervical junction and cervicothoraci c junction, were obtained according to standard protocol without and with intravenous contrast.  Findings:  Cervicomedullary junction is within normal limits. Normal cervical spinal cord.  See thoracic spinal cord abnormality below.  Normal cervical vertebral height and  alignment. No marrow edema or evidence of acute osseous abnormality.  Visualized paraspinal soft tissues are within normal limits.  Minor cervical degenerative changes.  No cervical disc herniation, spinal stenosis, or significant neural foraminal stenosis.  IMPRESSION: 1.  Negative cervical  MRI. 2.  Abnormal thoracic MRI, see below.  MRI THORACIC SPINE WITHOUT AND WITH CONTRAST  Technique: Multiplanar and multiecho pulse sequences of the thoracic spine were obtained without and  with intravenous contrast.  Findings:  Abnormal thoracic spinal cord expansion from T4-T5 to T6- T7 appears to be the result of a an oblong intradural and mostly or completely intramedullary mass with intrinsic T1  hyperintensity and signal suppression on both STIR and fat saturated postcontrast T1 images.  The mass is about 5 cm in length and oval measuring up to 9 mm diameter.  There is evidence of mild cord edema immediately surrounding the lesion which extends to the mid T4 and mid T7 levels.  Following contrast there is little if any enhancement. The lesion might be slightly exophytic to the cord along the dorsal extent ( see series 15 image 19).  The epidural space at the level of the mass is within normal limits. No definite subarachnoid space abnormality.  There is no associated spinal dysraphism.  The other thoracic spinal cord levels are within normal limits. The conus was better visualized on the lumbar comparison but appears within normal limits.  No other areas of abnormal intradural enhancement.  Normal thoracic vertebral height, alignment, and marrow signal.Visualized paraspinal soft tissues are within normal limits.  Negative visualized thoracic and upper abdominal viscera.  IMPRESSION:  1.  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  Little if any enhancement.  Associated spinal cord expansion and mild cord edema.   No associated spinal dysraphism identified. 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  CT thoracic spine without contrast might be valuable to evaluate for any calcification within the mass (which would strongly indicate a  dermoid). 2.  No other thoracic spine abnormality.  Study discussed with Dr. Thana Farr at [phone] hours on 11/08/2012.   Original Report Authenticated By: Erskine Speed, M.D.    Mr Lumbar Spine Wo Contrast  11/07/2012  *RADIOLOGY REPORT*  Clinical Data: Worsening bowel and bladder incontinence for the past week.  MRI LUMBAR SPINE WITHOUT CONTRAST  Technique:  Multiplanar and multiecho pulse sequences of the lumbar spine were obtained without intravenous contrast.  Comparison: 08/01/2012.  Findings: Last fully open disc space is labeled L5-S1.  Present examination incorporates from T10-11 disc space through the lower sacrum.  Conus L1 level.  No compression of the distal cord or conus.  No abnormal signal within the distal cord or conus.  Visualized paravertebral structures without gross abnormality. Evaluation slightly limited by respiratory motion.  No worrisome bony lesion.  T10-11:  Mild left-sided facet bony overgrowth.  T11-12:  Minimal left facet bony overgrowth.  T12-L1:  Negative.  L1-2:  Negative.  L2-3:  Very small left foraminal/lateral protrusion approaching but not compressing the exiting left L2 nerve root.  Minimal bulge. Very mild facet joint degenerative changes.  L3-4:  Prominent right-sided facet joint degenerative changes containing fluid undermining the right ligamenta flava.  Mild left- sided facet joint degenerative changes.  Bulge with lateral extension approaching but not compressing the exiting L3 nerve roots.  Dorsal epidural fat.  Mild  spinal stenosis  L4-5:  Mild facet joint degenerative changes greater on the right. Mild ligamentum flavum hypertrophy.  Bulge.  Left lateral disc osteophyte complex approaches but does not compress the exiting left L4 nerve root.  Very mild spinal stenosis.  L5-S1:  No significant spinal stenosis or foraminal narrowing.  IMPRESSION: No conus compression detected as cause of patient's symptoms.  Scattered degenerative changes most notable at the L3-4  level as detailed above. Findings without significant change.   Original Report Authenticated By: Lacy Duverney, M.D.    Mr Thoracic Spine W Wo Contrast  11/08/2012  *RADIOLOGY REPORT*  Clinical Data:  36 year old female with back pain, bilateral lower extremity numbness and weakness.  Worsening bowel and bladder incontinence.  Possible transverse myelitis.  Contrast: 15 ml MultiHance.  Comparison: Lumbar MRI 11/07/2012 and earlier.  MRI CERVICAL SPINE WITHOUT AND WITH CONTRAST  Technique:  Multiplanar and multiecho pulse sequences of the cervic al spine, to include the craniocervical junction and cervicothoraci c junction, were obtained according to standard protocol without and with intravenous contrast.  Findings:  Cervicomedullary junction is within normal limits. Normal cervical spinal cord.  See thoracic spinal cord abnormality below.  Normal cervical vertebral height and alignment. No marrow edema or evidence of acute osseous abnormality.  Visualized paraspinal soft tissues are within normal limits.  Minor cervical degenerative changes.  No cervical disc herniation, spinal stenosis, or significant neural foraminal stenosis.  IMPRESSION: 1.  Negative cervical MRI. 2.  Abnormal thoracic MRI, see below.  MRI THORACIC SPINE WITHOUT AND WITH CONTRAST  Technique: Multiplanar and multiecho pulse sequences of the thoracic spine were obtained without and  with intravenous contrast.  Findings:  Abnormal thoracic spinal cord expansion from T4-T5 to T6- T7 appears to be the result of a an oblong intradural and mostly or completely intramedullary mass with intrinsic T1  hyperintensity and signal suppression on both STIR and fat saturated postcontrast T1 images.  The mass is about 5 cm in length and oval measuring up to 9 mm diameter.  There is evidence of mild cord edema immediately surrounding the lesion which extends to the mid T4 and mid T7 levels.  Following contrast there is little if any enhancement. The lesion  might be slightly exophytic to the cord along the dorsal extent ( see series 15 image 19).  The epidural space at the level of the mass is within normal limits. No definite subarachnoid space abnormality.  There is no associated spinal dysraphism.  The other thoracic spinal cord levels are within normal limits. The conus was better visualized on the lumbar comparison but appears within normal limits.  No other areas of abnormal intradural enhancement.  Normal thoracic vertebral height, alignment, and marrow signal.Visualized paraspinal soft tissues are within normal limits.  Negative visualized thoracic and upper abdominal viscera.  IMPRESSION:  1.  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  Little if any enhancement.  Associated spinal cord expansion and mild cord edema.   No associated spinal dysraphism identified. 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  CT thoracic spine without contrast might be valuable to evaluate for any calcification within the mass (which would strongly indicate a dermoid). 2.  No other thoracic spine abnormality.  Study discussed with Dr. Thana Farr at [phone] hours on 11/08/2012.   Original Report Authenticated By: Erskine Speed, M.D.  Medications:  I have reviewed the patient's current medications. Scheduled:   . dexamethasone  4 mg Intravenous Q6H  . enoxaparin (LOVENOX) injection  40 mg Subcutaneous Q24H  . hydrochlorothiazide  12.5 mg Oral Daily  . insulin aspart  0-9 Units Subcutaneous TID WC  . lisinopril  20 mg Oral Daily  . nicotine  21 mg Transdermal Daily  . pantoprazole  40 mg Oral Daily  . senna-docusate  2 tablet Oral QHS  . 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  Scheduled for surgery.   Plan: Will continue to follow clinically    LOS: 2 days   Thana Farr, MD [facility] [phone] 11/09/2012  11:00 AM

## 2012-11-09 NOTE — Progress Notes (Signed)
Overall doing well. Legs feel better after steroids.  Afebrile with stable vitals. Motor examination significant better. 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  Continue IV steroids. 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

## 2012-11-09 NOTE — Progress Notes (Signed)
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 Continue IV Decadron for now.  Debbie [name] prophylaxis and sliding scale insulin while patient is on Decadron  HTN (hypertension)  Blood pressure stable at present. Continue home meds  Hypokalemia  Resolved.   Constipation On senna and Colace. Add MiraLAX   tobacco use Counseled on smoking cessation Ordered nicotine patch  DVT prophylaxis  Code Status: full Family Communication: husband at bedside Disposition Plan: pending surgery on 12/9   Consultants:  Neurology   neurosurgery ( Dr Jordan Likes)  Procedures:  Her spinal  surgery on 12/9  Antibiotics:  none  HPI/Subjective: Patient feels better strength in her lower extremities and feels less shaky. She has been able to hold her urine today. Feels constipated  Objective: Filed Vitals:   11/08/12 2130 11/09/12 0426 11/09/12 1019 11/09/12 1320  BP: 149/88 117/79 134/84 135/77  Pulse: 80 76 73 77  Temp: 98.9 F (37.2 C) 97 F (36.1 C)  98.8 F (37.1 C)  TempSrc:    Oral  Resp: 18 20  18   Height:      Weight:      SpO2: 93% 94%  94%    Intake/Output Summary (Last 24 hours) at 11/09/12 1429 Last data filed at 11/09/12 1300  Gross per 24 hour  Intake    840 ml  Output      0 ml  Net    840 ml   Filed Weights   11/08/12 0128 11/08/12 0222  Weight: 92.534 kg (204 lb) 93.532 kg (206 lb 3.2 oz)    Exam:   General:  Middle aged female in NAD  Cardiovascular: N S1 &S2, no murmurs  Respiratory: Clear breath sounds bilaterally  Abdomen: Soft, nontender, bowel sounds present  Extremities: Warm, no edema  CNS: AAO x3, normal power and coomb in bilateral lower  extremities, plantars still upwards bilaterally, hyperreflexia of LE  noticed yesterday have been diminished.  Data Reviewed: Basic Metabolic Panel:  Lab 11/08/12 0865 11/07/12 2116  NA 139 142  K 4.0 3.4*  CL 104 104  CO2 25 25  GLUCOSE 136* 98  BUN 14 13  CREATININE 0.71 0.73  CALCIUM 9.8 9.7  MG -- --  PHOS -- --   Liver Function Tests: No results found for this basename: AST:5,ALT:5,ALKPHOS:5,BILITOT:5,PROT:5,ALBUMIN:5 in the last 168 hours No results found for this basename: LIPASE:5,AMYLASE:5 in the last 168 hours No results found for this basename: AMMONIA:5 in the last 168 hours CBC:  Lab 11/08/12 0609 11/07/12 2116  WBC 13.5* 11.9*  NEUTROABS -- 5.8  HGB 13.5 13.4  HCT 40.8 39.1  MCV 91.9 91.4  PLT 255 259   Cardiac Enzymes: No results found for this basename: CKTOTAL:5,CKMB:5,CKMBINDEX:5,TROPONINI:5 in the last 168 hours BNP (last 3 results) No results found for this basename: PROBNP:3 in the last 8760 hours CBG:  Lab 11/09/12 1201 11/09/12 0805 11/08/12 2050 11/08/12 1725 11/08/12 1147  GLUCAP 101* 141* 198* 120* 148*    No results found for this or any previous visit (from the past 240 hour(s)).   Studies: Mr Cervical Spine Wo Contrast  11/08/2012  *RADIOLOGY REPORT*  Clinical Data:  36 year old female with back pain, bilateral lower extremity numbness and weakness.  Worsening bowel and bladder incontinence.  Possible transverse myelitis.  Contrast: 15 ml MultiHance.  Comparison: Lumbar MRI 11/07/2012 and earlier.  MRI CERVICAL SPINE WITHOUT AND WITH CONTRAST  Technique:  Multiplanar and multiecho pulse sequences of the cervic al spine, to include the craniocervical junction and cervicothoraci c junction, were obtained according to standard protocol without and with intravenous contrast.  Findings:  Cervicomedullary junction is within normal limits. Normal cervical spinal cord.  See thoracic spinal cord abnormality below.  Normal cervical vertebral height and  alignment. No marrow edema or evidence of acute osseous abnormality.  Visualized paraspinal soft tissues are within normal limits.  Minor cervical degenerative changes.  No cervical disc herniation, spinal stenosis, or significant neural foraminal stenosis.  IMPRESSION: 1.  Negative cervical MRI. 2.  Abnormal thoracic MRI, see below.  MRI THORACIC SPINE WITHOUT AND WITH CONTRAST  Technique: Multiplanar and multiecho pulse sequences of the thoracic spine were obtained without and  with intravenous contrast.  Findings:  Abnormal thoracic spinal cord expansion from T4-T5 to T6- T7 appears to be the result of a an oblong intradural and mostly or completely intramedullary mass with intrinsic T1  hyperintensity and signal suppression on both STIR and fat saturated postcontrast T1 images.  The mass is about 5 cm in length and oval measuring up to 9 mm diameter.  There is evidence of mild cord edema immediately surrounding the lesion which extends to the mid T4 and mid T7 levels.  Following contrast there is little if any enhancement. The lesion might be slightly exophytic to the cord along the dorsal extent ( see series 15 image 19).  The epidural space at the level of the mass is within normal limits. No definite subarachnoid space abnormality.  There is no associated spinal dysraphism.  The other thoracic spinal cord levels are within normal limits. The conus was better visualized on the lumbar comparison but appears within normal limits.  No other areas of abnormal intradural enhancement.  Normal thoracic vertebral height, alignment, and marrow signal.Visualized paraspinal soft tissues are within normal limits.  Negative visualized thoracic and upper abdominal viscera.  IMPRESSION:  1.  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  Little if any enhancement.  Associated spinal cord  expansion and mild cord edema.   No associated spinal dysraphism identified. 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  CT thoracic spine without contrast might be valuable to evaluate for any calcification within the mass (which would strongly indicate a dermoid). 2.  No other thoracic spine abnormality.  Study discussed with Dr. Thana Farr at [phone] hours on 11/08/2012.   Original Report Authenticated By: Erskine Speed, M.D.    Mr Lumbar Spine Wo Contrast  11/07/2012  *RADIOLOGY REPORT*  Clinical Data: Worsening bowel and bladder incontinence for the past week.  MRI LUMBAR SPINE WITHOUT CONTRAST  Technique:  Multiplanar and multiecho pulse sequences of the lumbar spine were obtained without intravenous contrast.  Comparison: 08/01/2012.  Findings: Last fully open disc space is labeled L5-S1.  Present examination incorporates from T10-11 disc space through the lower sacrum.  Conus L1 level.  No compression of the distal cord or conus.  No abnormal signal within the distal cord or conus.  Visualized paravertebral structures without gross abnormality. Evaluation slightly limited by respiratory motion.  No worrisome bony lesion.  T10-11:  Mild left-sided facet bony overgrowth.  T11-12:  Minimal left  facet bony overgrowth.  T12-L1:  Negative.  L1-2:  Negative.  L2-3:  Very small left foraminal/lateral protrusion approaching but not compressing the exiting left L2 nerve root.  Minimal bulge. Very mild facet joint degenerative changes.  L3-4:  Prominent right-sided facet joint degenerative changes containing fluid undermining the right ligamenta flava.  Mild left- sided facet joint degenerative changes.  Bulge with lateral extension approaching but not compressing the exiting L3 nerve roots.  Dorsal epidural fat.  Mild spinal stenosis  L4-5:  Mild facet joint degenerative changes greater on the right. Mild ligamentum flavum hypertrophy.  Bulge.  Left lateral disc osteophyte complex  approaches but does not compress the exiting left L4 nerve root.  Very mild spinal stenosis.  L5-S1:  No significant spinal stenosis or foraminal narrowing.  IMPRESSION: No conus compression detected as cause of patient's symptoms.  Scattered degenerative changes most notable at the L3-4 level as detailed above. Findings without significant change.   Original Report Authenticated By: Lacy Duverney, M.D.    Mr Thoracic Spine W Wo Contrast  11/08/2012  *RADIOLOGY REPORT*  Clinical Data:  36 year old female with back pain, bilateral lower extremity numbness and weakness.  Worsening bowel and bladder incontinence.  Possible transverse myelitis.  Contrast: 15 ml MultiHance.  Comparison: Lumbar MRI 11/07/2012 and earlier.  MRI CERVICAL SPINE WITHOUT AND WITH CONTRAST  Technique:  Multiplanar and multiecho pulse sequences of the cervic al spine, to include the craniocervical junction and cervicothoraci c junction, were obtained according to standard protocol without and with intravenous contrast.  Findings:  Cervicomedullary junction is within normal limits. Normal cervical spinal cord.  See thoracic spinal cord abnormality below.  Normal cervical vertebral height and alignment. No marrow edema or evidence of acute osseous abnormality.  Visualized paraspinal soft tissues are within normal limits.  Minor cervical degenerative changes.  No cervical disc herniation, spinal stenosis, or significant neural foraminal stenosis.  IMPRESSION: 1.  Negative cervical MRI. 2.  Abnormal thoracic MRI, see below.  MRI THORACIC SPINE WITHOUT AND WITH CONTRAST  Technique: Multiplanar and multiecho pulse sequences of the thoracic spine were obtained without and  with intravenous contrast.  Findings:  Abnormal thoracic spinal cord expansion from T4-T5 to T6- T7 appears to be the result of a an oblong intradural and mostly or completely intramedullary mass with intrinsic T1  hyperintensity and signal suppression on both STIR and fat  saturated postcontrast T1 images.  The mass is about 5 cm in length and oval measuring up to 9 mm diameter.  There is evidence of mild cord edema immediately surrounding the lesion which extends to the mid T4 and mid T7 levels.  Following contrast there is little if any enhancement. The lesion might be slightly exophytic to the cord along the dorsal extent ( see series 15 image 19).  The epidural space at the level of the mass is within normal limits. No definite subarachnoid space abnormality.  There is no associated spinal dysraphism.  The other thoracic spinal cord levels are within normal limits. The conus was better visualized on the lumbar comparison but appears within normal limits.  No other areas of abnormal intradural enhancement.  Normal thoracic vertebral height, alignment, and marrow signal.Visualized paraspinal soft tissues are within normal limits.  Negative visualized thoracic and upper abdominal viscera.  IMPRESSION:  1.  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  Little if any enhancement.  Associated spinal cord expansion and mild cord edema.   No associated spinal dysraphism identified. 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  CT thoracic spine without contrast might be valuable to evaluate for any calcification within the mass (which would strongly indicate a dermoid). 2.  No other thoracic spine abnormality.  Study discussed with Dr. Thana Farr at [phone] hours on 11/08/2012.   Original Report Authenticated By: Erskine Speed, M.D.     Scheduled Meds:   . dexamethasone  4 mg Intravenous Q6H  . enoxaparin (LOVENOX) injection  40 mg Subcutaneous Q24H  . hydrochlorothiazide  12.5 mg Oral Daily  . insulin aspart  0-9 Units Subcutaneous TID WC  . lisinopril  20 mg Oral Daily  . nicotine  21 mg Transdermal Daily  .  pantoprazole  40 mg Oral Daily  . senna-docusate  2 tablet Oral QHS   Continuous Infusions:      Time spent: 25 minutes    Cherissa Hook  [facility] Pager [phone] If 8PM-8AM, please contact night-coverage at [email or web address], password [facility] 11/09/2012, 2:29 PM  LOS: 2 days

## 2012-11-10 LAB — GLUCOSE, CAPILLARY: Glucose-Capillary: 148 mg/dL — ABNORMAL HIGH (ref 70–99)

## 2012-11-10 LAB — SURGICAL PCR SCREEN
MRSA, PCR: NEGATIVE
Staphylococcus aureus: NEGATIVE

## 2012-11-10 LAB — TYPE AND SCREEN

## 2012-11-10 MED ORDER — ZOLPIDEM TARTRATE 5 MG PO TABS: 5.0000 mg | ORAL_TABLET | Freq: Every evening | ORAL | Status: DC | PRN

## 2012-11-10 MED ORDER — DEXTROSE 5 % IV SOLN
3.0000 g | INTRAVENOUS | Status: AC
  Administered 2012-11-11: 3 g via INTRAVENOUS
  Filled 2012-11-10 (×2): qty 3000

## 2012-11-10 MED ORDER — ZOLPIDEM TARTRATE 5 MG PO TABS: 5.0000 mg | ORAL_TABLET | Freq: Every day | ORAL | Status: DC

## 2012-11-10 MED ORDER — BISACODYL 10 MG RE SUPP
10.0000 mg | Freq: Once | RECTAL | Status: AC
  Administered 2012-11-10: 10 mg via RECTAL
  Filled 2012-11-10: qty 1

## 2012-11-10 NOTE — Progress Notes (Signed)
Utilization review completed.  

## 2012-11-10 NOTE — Progress Notes (Signed)
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 Continue IV Decadron for now.  -GI prophylaxis and sliding scale insulin while patient is on Decadron  -NPO after midnight  HTN (hypertension)  Blood pressure stable at present. Continue home meds   Hypokalemia  Resolved.   Constipation  On senna and Colace. Added miralax without much relief. Will add dulcolax suppository  tobacco use  Counseled on smoking cessation  Ordered nicotine patch   DVT prophylaxis  Hold lovenox for sx tomorrow  Code Status: full  Family Communication: no one at bedside today  Disposition Plan: pending surgery on 12/9   Consultants:  Neurology  neurosurgery ( Dr Jordan Likes)   Procedures:  For surgery tomorrow  Antibiotics:  none  HPI/Subjective: Feels better. Still unsteady over left leg  Objective: Filed Vitals:   11/09/12 1320 11/09/12 2100 11/10/12 0500 11/10/12 1103  BP: 135/77 146/75 130/84 132/71  Pulse: 77 74 65 54  Temp: 98.8 F (37.1 C) 98.3 F (36.8 C) 98.4 F (36.9 C)   TempSrc: Oral Oral Oral   Resp: 18 18 18    Height:      Weight:   94.802 kg (209 lb)   SpO2: 94% 98% 98%     Intake/Output Summary (Last 24 hours) at 11/10/12 1204 Last data filed at 11/10/12 0900  Gross per 24 hour  Intake    960 ml  Output      0 ml  Net    960 ml   Filed Weights   11/08/12 0128 11/08/12 0222 11/10/12 0500  Weight: 92.534 kg (204 lb) 93.532 kg (206 lb 3.2 oz) 94.802 kg (209 lb)    Exam:  General: Middle aged female in NAD  Cardiovascular: N S1 &S2, no murmurs  Respiratory: Clear breath sounds bilaterally  Abdomen: Soft, nontender, bowel sounds present  Extremities: Warm,  no edema  CNS: AAO x3, normal power and tone in bilateral lower extremities, plantars up going over left leg with equivocal over rt side. , hyperreflexia of left  LE present. Resolved over rt leg   Data Reviewed: Basic Metabolic Panel:  Lab 11/08/12 9562 11/07/12 2116  NA 139 142  K 4.0 3.4*  CL 104 104  CO2 25 25  GLUCOSE 136* 98  BUN 14 13  CREATININE 0.71 0.73  CALCIUM 9.8 9.7  MG -- --  PHOS -- --   Liver Function Tests: No results found for this basename: AST:5,ALT:5,ALKPHOS:5,BILITOT:5,PROT:5,ALBUMIN:5 in the last 168 hours No results found for this basename: LIPASE:5,AMYLASE:5 in the last 168 hours No results found for this basename: AMMONIA:5 in the last 168 hours CBC:  Lab 11/08/12 0609 11/07/12 2116  WBC 13.5* 11.9*  NEUTROABS -- 5.8  HGB 13.5 13.4  HCT 40.8 39.1  MCV 91.9 91.4  PLT 255 259   Cardiac Enzymes: No results found for this basename: CKTOTAL:5,CKMB:5,CKMBINDEX:5,TROPONINI:5 in the last 168 hours BNP (last 3 results) No results found for this basename: PROBNP:3 in the last 8760 hours CBG:  Lab 11/10/12 0737 11/09/12 2116 11/09/12 1654 11/09/12 1201 11/09/12 0805  GLUCAP 125* 128* 150* 101* 141*    No results found for this or any previous visit (from the past 240 hour(s)).   Studies: No results found.  Scheduled Meds:   . [  COMPLETED] bisacodyl  10 mg Rectal Once  . dexamethasone  4 mg Intravenous Q6H  . hydrochlorothiazide  12.5 mg Oral Daily  . insulin aspart  0-9 Units Subcutaneous TID WC  . lisinopril  20 mg Oral Daily  . nicotine  21 mg Transdermal Daily  . pantoprazole  40 mg Oral Daily  . polyethylene glycol  17 g Oral Daily  . senna-docusate  2 tablet Oral QHS  . [DISCONTINUED] enoxaparin (LOVENOX) injection  40 mg Subcutaneous Q24H   Continuous Infusions:      Time spent: 25 minutes    Siddarth Hsiung  [facility] Pager [phone]. If 8PM-8AM, please contact night-coverage at [email or web address], password  [facility] 11/10/2012, 12:04 PM  LOS: 3 days

## 2012-11-10 NOTE — Progress Notes (Signed)
Overall stable. No new issues. Plan for surgery tomorrow afternoon.

## 2012-11-11 ENCOUNTER — Encounter (HOSPITAL_COMMUNITY): Payer: Self-pay | Admitting: Anesthesiology

## 2012-11-11 ENCOUNTER — Encounter (HOSPITAL_COMMUNITY)
Admission: EM | Disposition: A | Discharge status: Rehabilitation Facility | Payer: Self-pay | Source: Home / Self Care | Attending: Internal Medicine

## 2012-11-11 ENCOUNTER — Inpatient Hospital Stay (HOSPITAL_COMMUNITY): Payer: Medicaid Other | Admitting: Anesthesiology

## 2012-11-11 HISTORY — PX: LAMINECTOMY: SHX219

## 2012-11-11 LAB — ABO/RH: ABO/RH(D): O POS

## 2012-11-11 LAB — GLUCOSE, CAPILLARY: Glucose-Capillary: 130 mg/dL — ABNORMAL HIGH (ref 70–99)

## 2012-11-11 SURGERY — THORACIC LAMINECTOMY FOR TUMOR
Anesthesia: General | Site: Back | Wound class: Clean

## 2012-11-11 MED ORDER — HYDROMORPHONE HCL PF 1 MG/ML IJ SOLN
INTRAMUSCULAR | Status: AC
  Administered 2012-11-11: 0.5 mg via INTRAVENOUS
  Filled 2012-11-11: qty 1

## 2012-11-11 MED ORDER — MIDAZOLAM HCL 5 MG/5ML IJ SOLN
INTRAMUSCULAR | Status: DC | PRN
Start: 2012-11-11 — End: 2012-11-11
  Administered 2012-11-11: 2 mg via INTRAVENOUS

## 2012-11-11 MED ORDER — OXYCODONE HCL 5 MG/5ML PO SOLN: 5.0000 mg | Freq: Once | ORAL | Status: AC | PRN

## 2012-11-11 MED ORDER — CHLORHEXIDINE GLUCONATE 4 % EX LIQD
CUTANEOUS | Status: AC
  Administered 2012-11-11: 05:00:00
  Filled 2012-11-11: qty 30

## 2012-11-11 MED ORDER — FENTANYL CITRATE 0.05 MG/ML IJ SOLN
INTRAMUSCULAR | Status: DC | PRN
  Administered 2012-11-11: 100 ug via INTRAVENOUS
  Administered 2012-11-11: 50 ug via INTRAVENOUS
  Administered 2012-11-11 (×2): 100 ug via INTRAVENOUS
  Administered 2012-11-11 (×3): 50 ug via INTRAVENOUS

## 2012-11-11 MED ORDER — LACTATED RINGERS IV SOLN
INTRAVENOUS | Status: DC | PRN
  Administered 2012-11-11 (×3): via INTRAVENOUS

## 2012-11-11 MED ORDER — ARTIFICIAL TEARS OP OINT
TOPICAL_OINTMENT | OPHTHALMIC | Status: DC | PRN
  Administered 2012-11-11: 1 via OPHTHALMIC

## 2012-11-11 MED ORDER — ONDANSETRON HCL 4 MG/2ML IJ SOLN
INTRAMUSCULAR | Status: DC | PRN
  Administered 2012-11-11: 4 mg via INTRAVENOUS

## 2012-11-11 MED ORDER — KETOROLAC TROMETHAMINE 30 MG/ML IJ SOLN
INTRAMUSCULAR | Status: AC
  Administered 2012-11-11: 30 mg via INTRAVENOUS
  Filled 2012-11-11: qty 1

## 2012-11-11 MED ORDER — LIDOCAINE HCL (CARDIAC) 20 MG/ML IV SOLN
INTRAVENOUS | Status: DC | PRN
  Administered 2012-11-11: 40 mg via INTRAVENOUS

## 2012-11-11 MED ORDER — GLYCOPYRROLATE 0.2 MG/ML IJ SOLN
INTRAMUSCULAR | Status: DC | PRN
  Administered 2012-11-11: .3 mg via INTRAVENOUS
  Administered 2012-11-11: 0.4 mg via INTRAVENOUS

## 2012-11-11 MED ORDER — ROCURONIUM BROMIDE 100 MG/10ML IV SOLN
INTRAVENOUS | Status: DC | PRN
  Administered 2012-11-11: 50 mg via INTRAVENOUS

## 2012-11-11 MED ORDER — NEOSTIGMINE METHYLSULFATE 1 MG/ML IJ SOLN
INTRAMUSCULAR | Status: DC | PRN
  Administered 2012-11-11: 3 mg via INTRAVENOUS

## 2012-11-11 MED ORDER — PROPOFOL 10 MG/ML IV BOLUS
INTRAVENOUS | Status: DC | PRN
  Administered 2012-11-11: 200 mg via INTRAVENOUS

## 2012-11-11 MED ORDER — HYDROMORPHONE HCL PF 1 MG/ML IJ SOLN
0.2500 mg | INTRAMUSCULAR | Status: DC | PRN
  Administered 2012-11-11 (×4): 0.5 mg via INTRAVENOUS

## 2012-11-11 MED ORDER — KETOROLAC TROMETHAMINE 30 MG/ML IJ SOLN
30.0000 mg | Freq: Once | INTRAMUSCULAR | Status: AC
  Administered 2012-11-11: 30 mg via INTRAVENOUS

## 2012-11-11 MED ORDER — MEPERIDINE HCL 25 MG/ML IJ SOLN
INTRAMUSCULAR | Status: AC
  Administered 2012-11-11: 12.5 mg
  Filled 2012-11-11: qty 1

## 2012-11-11 MED ORDER — THROMBIN 20000 UNITS EX KIT
PACK | CUTANEOUS | Status: DC | PRN
  Administered 2012-11-11: 20:00:00 via TOPICAL

## 2012-11-11 MED ORDER — 0.9 % SODIUM CHLORIDE (POUR BTL) OPTIME
TOPICAL | Status: DC | PRN
  Administered 2012-11-11: 1000 mL

## 2012-11-11 MED ORDER — SODIUM CHLORIDE 0.9 % IR SOLN
Status: DC | PRN
  Administered 2012-11-11: 20:00:00

## 2012-11-11 MED ORDER — VECURONIUM BROMIDE 10 MG IV SOLR
INTRAVENOUS | Status: DC | PRN
  Administered 2012-11-11: 1 mg via INTRAVENOUS
  Administered 2012-11-11: 2 mg via INTRAVENOUS

## 2012-11-11 MED ORDER — PROMETHAZINE HCL 25 MG/ML IJ SOLN: 6.2500 mg | INTRAMUSCULAR | Status: DC | PRN

## 2012-11-11 MED ORDER — OXYCODONE HCL 5 MG PO TABS: 5.0000 mg | ORAL_TABLET | Freq: Once | ORAL | Status: AC | PRN

## 2012-11-11 SURGICAL SUPPLY — 72 items
BAG DECANTER FOR FLEXI CONT (MISCELLANEOUS) ×2 IMPLANT
BENZOIN TINCTURE PRP APPL 2/3 (GAUZE/BANDAGES/DRESSINGS) ×2 IMPLANT
BLADE SURG 11 STRL SS (BLADE) ×2 IMPLANT
BLADE SURG ROTATE 9660 (MISCELLANEOUS) IMPLANT
BRUSH SCRUB EZ PLAIN DRY (MISCELLANEOUS) ×2 IMPLANT
BUR CUTTER 7.0 ROUND (BURR) ×2 IMPLANT
BUR MATCHSTICK NEURO 3.0 LAGG (BURR) ×2 IMPLANT
CANISTER SUCTION 2500CC (MISCELLANEOUS) ×2 IMPLANT
CLOTH BEACON ORANGE TIMEOUT ST (SAFETY) ×2 IMPLANT
CONT SPEC 4OZ CLIKSEAL STRL BL (MISCELLANEOUS) ×4 IMPLANT
DERMABOND ADVANCED (GAUZE/BANDAGES/DRESSINGS) ×1
DERMABOND ADVANCED .7 DNX12 (GAUZE/BANDAGES/DRESSINGS) ×1 IMPLANT
DRAPE LAPAROTOMY 100X72X124 (DRAPES) ×2 IMPLANT
DRAPE LAPAROTOMY T 102X78X121 (DRAPES) IMPLANT
DRAPE MICROSCOPE ZEISS OPMI (DRAPES) ×2 IMPLANT
DRAPE POUCH INSTRU U-SHP 10X18 (DRAPES) ×2 IMPLANT
DRAPE SURG 17X23 STRL (DRAPES) ×4 IMPLANT
DURAGUARD 04CMX04CM ×2 IMPLANT
ELECT REM PT RETURN 9FT ADLT (ELECTROSURGICAL) ×2
ELECTRODE REM PT RTRN 9FT ADLT (ELECTROSURGICAL) ×1 IMPLANT
GAUZE SPONGE 4X4 16PLY XRAY LF (GAUZE/BANDAGES/DRESSINGS) IMPLANT
GLOVE BIO SURGEON STRL SZ 6.5 (GLOVE) ×6 IMPLANT
GLOVE BIOGEL PI IND STRL 6.5 (GLOVE) ×1 IMPLANT
GLOVE BIOGEL PI IND STRL 7.5 (GLOVE) ×1 IMPLANT
GLOVE BIOGEL PI INDICATOR 6.5 (GLOVE) ×1
GLOVE BIOGEL PI INDICATOR 7.5 (GLOVE) ×1
GLOVE ECLIPSE 7.5 STRL STRAW (GLOVE) ×2 IMPLANT
GLOVE ECLIPSE 8.5 STRL (GLOVE) ×2 IMPLANT
GLOVE EXAM NITRILE LRG STRL (GLOVE) IMPLANT
GLOVE EXAM NITRILE MD LF STRL (GLOVE) IMPLANT
GLOVE EXAM NITRILE XL STR (GLOVE) IMPLANT
GLOVE EXAM NITRILE XS STR PU (GLOVE) IMPLANT
GOWN BRE IMP SLV AUR LG STRL (GOWN DISPOSABLE) ×4 IMPLANT
GOWN BRE IMP SLV AUR XL STRL (GOWN DISPOSABLE) ×2 IMPLANT
GOWN STRL REIN 2XL LVL4 (GOWN DISPOSABLE) IMPLANT
HEMOSTAT SURGICEL 2X14 (HEMOSTASIS) ×2 IMPLANT
KIT BASIN OR (CUSTOM PROCEDURE TRAY) ×2 IMPLANT
KIT ROOM TURNOVER OR (KITS) ×2 IMPLANT
NEEDLE HYPO 22GX1.5 SAFETY (NEEDLE) ×2 IMPLANT
NEEDLE HYPO 25X1 1.5 SAFETY (NEEDLE) ×2 IMPLANT
NEEDLE SPNL 20GX3.5 QUINCKE YW (NEEDLE) IMPLANT
NS IRRIG 1000ML POUR BTL (IV SOLUTION) ×2 IMPLANT
PACK LAMINECTOMY NEURO (CUSTOM PROCEDURE TRAY) ×2 IMPLANT
PAD EYE OVAL STERILE LF (GAUZE/BANDAGES/DRESSINGS) IMPLANT
PATTIES SURGICAL .5 X.5 (GAUZE/BANDAGES/DRESSINGS) IMPLANT
PATTIES SURGICAL .5 X3 (DISPOSABLE) IMPLANT
RUBBERBAND STERILE (MISCELLANEOUS) ×8 IMPLANT
SEALANT DURASEAL SPINE 3ML (Neuro Prosthesis/Implant) ×2 IMPLANT
SPONGE GAUZE 4X4 12PLY (GAUZE/BANDAGES/DRESSINGS) IMPLANT
SPONGE LAP 4X18 X RAY DECT (DISPOSABLE) IMPLANT
SPONGE SURGIFOAM ABS GEL 100 (HEMOSTASIS) ×2 IMPLANT
STAPLER VISISTAT 35W (STAPLE) IMPLANT
STRIP CLOSURE SKIN 1/2X4 (GAUZE/BANDAGES/DRESSINGS) ×2 IMPLANT
SUT BONE WAX W31G (SUTURE) IMPLANT
SUT ETHILON 4 0 PS 2 18 (SUTURE) IMPLANT
SUT NURALON 4 0 TR CR/8 (SUTURE) ×6 IMPLANT
SUT PROLENE 5 0 C 1 24 (SUTURE) ×4 IMPLANT
SUT PROLENE 6 0 BV (SUTURE) ×4 IMPLANT
SUT VIC AB 0 CT1 18XCR BRD8 (SUTURE) ×3 IMPLANT
SUT VIC AB 0 CT1 8-18 (SUTURE) ×3
SUT VIC AB 2-0 CT1 18 (SUTURE) ×2 IMPLANT
SUT VIC AB 3-0 SH 8-18 (SUTURE) ×2 IMPLANT
SUT VICRYL 4-0 PS2 18IN ABS (SUTURE) ×2 IMPLANT
SYR 20ML ECCENTRIC (SYRINGE) ×2 IMPLANT
TAPE CLOTH SURG 4X10 WHT LF (GAUZE/BANDAGES/DRESSINGS) ×2 IMPLANT
TIP NONSTICK .5MMX23CM (INSTRUMENTS) ×1
TIP NONSTICK .5X23 (INSTRUMENTS) ×1 IMPLANT
TOWEL OR 17X24 6PK STRL BLUE (TOWEL DISPOSABLE) ×2 IMPLANT
TOWEL OR 17X26 10 PK STRL BLUE (TOWEL DISPOSABLE) ×2 IMPLANT
TRAY FOLEY CATH 14FRSI W/METER (CATHETERS) IMPLANT
TUBE CONNECTING 12X1/4 (SUCTIONS) IMPLANT
WATER STERILE IRR 1000ML POUR (IV SOLUTION) ×2 IMPLANT

## 2012-11-11 NOTE — Transfer of Care (Signed)
Immediate Anesthesia Transfer of Care Note  Patient: Debbie [name]  Procedure(s) Performed: Procedure(s) (LRB) with comments: THORACIC LAMINECTOMY FOR TUMOR (N/A) - thracic laminectomy for intradural and intramedullary neoplasm  Patient Location: PACU  Anesthesia Type:General  Level of Consciousness: awake, alert , oriented and patient cooperative  Airway & Oxygen Therapy: Patient Spontanous Breathing and Patient connected to nasal cannula oxygen  Post-op Assessment: Report given to PACU RN and Post -op Vital signs reviewed and stable  Post vital signs: Reviewed and stable  Complications: No apparent anesthesia complications

## 2012-11-11 NOTE — H&P (View-Only) (Signed)
Overall doing well. Legs feel better after steroids.  Afebrile with stable vitals. Motor examination significant better. 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  Continue IV steroids. 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 

## 2012-11-11 NOTE — Interval H&P Note (Signed)
History and Physical Interval Note:  11/11/2012 5:41 PM  Debbie [name]  has presented today for surgery, with the diagnosis of Thoracic lesion  The various methods of treatment have been discussed with the patient and family. After consideration of risks, benefits and other options for treatment, the patient has consented to  Procedure(s) (LRB) with comments: THORACIC LAMINECTOMY FOR TUMOR (N/A) as a surgical intervention .  The patient's history has been reviewed, patient examined, no change in status, stable for surgery.  I have reviewed the patient's chart and labs.  Questions were answered to the patient's satisfaction.     Natalia Wittmeyer A

## 2012-11-11 NOTE — Op Note (Signed)
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  Duraplasty  Microdissection  Surgeon:Madellyn Denio A.Melanny Wire, M.D.  Asst. 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 Workup demonstrates evidence of a spinal cord lesion at T5-T7 level consistent with an intramedullary lesion. Imaging characteristics or most consistent with fatty tissue. Differential was felt to be between a dermoid lesion versus and intrinsic spinal cord lipoma. I discussed situation with the patient. As she is rapidly losing function we've discussed the possibility undergoing laminectomy and hopeful resection/debulking of this lesion. She is aware the risks and benefits and wishes to proceed.  Operative note: After induction anesthesia patient positioned prone onto the Wilson frame and her purply padded. The thoracic region prepped and draped sterilely. Incision made overlying the T5-T7 levels. Supper off Sexton performed exposing the lamina of C5-6 and 7. Decompressive laminectomies then performed using Leksell rongeurs Kerrison rongeurs the high-speed drill to remove the entire lamina of T5-T6 and T7. Hemostasis was achieved. Dura was elevated and incised inferior to the tumor. CSF was allowed to drain. The dura was then incised cephalad exposing grossly enlarged spinal cord. Dura was held up with tack up sutures. Microscope then brought field these were microdissection. There was dense arachnoid overlying the dorsal aspect of the enlarged spinal cord. Beneath this was obvious yellowish tissue consistent with  fatty tissue. The arachnoid was elevated and incised. An intrinsic lipoma was discovered. There was no margin for resection. With that in mind the lipoma was biopsied and then partially debulked using the ultrasonic aspirator under careful microscopic technique. Staying within the lipoma the lesion was partially debulked. A gross total debulking was certainly not possible. With that in mind I felt that it was better to take 2 little min too much. Hemostasis was ensured. A dural patch was fashioned using bovine paracranium. This was then sutured into the dura to perform a duraplasty and provided additional space for the spinal cord. Watertight closure was performed to the best my ability. DuraSeal fibrin glue sealant was placed over the dural repair and Gelfoam was placed over the laminectomy defect. Wound is then closed in layers with Vicryl sutures. Skin was closed with a running interlocking nylon suture. There were no apparent complications. Patient tolerated procedure well. She returns they're covering postop.

## 2012-11-11 NOTE — Progress Notes (Signed)
Patient seen and examined. anxious for surgery. No overnight issues.  would request neurosurgery to resume care primarily post op as no medical issues at this time.

## 2012-11-11 NOTE — Preoperative (Signed)
Beta Blockers   Reason not to administer Beta Blockers:Not Applicable 

## 2012-11-11 NOTE — Brief Op Note (Signed)
11/07/2012 - 11/11/2012  9:59 PM  PATIENT:  [name]  36 y.o. female  PRE-OPERATIVE DIAGNOSIS:  Thoracic lesion  POST-OPERATIVE DIAGNOSIS:  throacic intramedullary lipoma  PROCEDURE:  Procedure(s) (LRB) with comments: THORACIC LAMINECTOMY FOR TUMOR (N/A) - thracic laminectomy for intradural and intramedullary neoplasm  SURGEON:  Surgeon(s) and Role:    * Temple Pacini, MD - Primary    * Cristi Loron, MD - Assisting  PHYSICIAN ASSISTANT:   ASSISTANTS:    ANESTHESIA:   general  EBL:  Total I/O In: 2000 [I.V.:2000] Out: 200 [Blood:200]  BLOOD ADMINISTERED:none  DRAINS: none   LOCAL MEDICATIONS USED:  NONE  SPECIMEN:  Source of Specimen:  Thoracic spinal cord  DISPOSITION OF SPECIMEN:  PATHOLOGY  COUNTS:  YES  TOURNIQUET:  * No tourniquets in log *  DICTATION: .Dragon Dictation  PLAN OF CARE: Admit to inpatient   PATIENT DISPOSITION:  PACU - hemodynamically stable.   Delay start of Pharmacological VTE agent (>24hrs) due to surgical blood loss or risk of bleeding: yes

## 2012-11-11 NOTE — Anesthesia Postprocedure Evaluation (Signed)
Anesthesia Post Note  Patient: Debbie [name]  Procedure(s) Performed: Procedure(s) (LRB): THORACIC LAMINECTOMY FOR TUMOR (N/A)  Anesthesia type: general  Patient location: PACU  Post pain: Pain level controlled  Post assessment: Patient's Cardiovascular Status Stable  Last Vitals:  Filed Vitals:   11/11/12 2245  BP: 135/84  Pulse: 57  Temp:   Resp: 15    Post vital signs: Reviewed and stable  Level of consciousness: sedated  Complications: No apparent anesthesia complications

## 2012-11-11 NOTE — Anesthesia Preprocedure Evaluation (Addendum)
Anesthesia Evaluation  Patient identified by MRN, date of birth, ID band Patient awake    Reviewed: Allergy & Precautions, H&P , NPO status , Patient's Chart, lab work & pertinent test results, reviewed documented beta blocker date and time   History of Anesthesia Complications Negative for: history of anesthetic complications  Airway Mallampati: II TM Distance: >3 FB Neck ROM: Full    Dental  (+) Teeth Intact and Dental Advisory Given   Pulmonary Current Smoker,    Pulmonary exam normal       Cardiovascular hypertension, Pt. on medications     Neuro/Psych    GI/Hepatic negative GI ROS, Neg liver ROS,   Endo/Other  negative endocrine ROS  Renal/GU negative Renal ROS     Musculoskeletal   Abdominal   Peds  Hematology   Anesthesia Other Findings   Reproductive/Obstetrics                          Anesthesia Physical Anesthesia Plan  ASA: III  Anesthesia Plan: General   Post-op Pain Management:    Induction: Intravenous  Airway Management Planned: Oral ETT  Additional Equipment:   Intra-op Plan:   Post-operative Plan: Extubation in OR  Informed Consent: I have reviewed the patients History and Physical, chart, labs and discussed the procedure including the risks, benefits and alternatives for the proposed anesthesia with the patient or authorized representative who has indicated his/her understanding and acceptance.   Dental advisory given  Plan Discussed with: Anesthesiologist, CRNA and Surgeon  Anesthesia Plan Comments:         Anesthesia Quick Evaluation

## 2012-11-11 NOTE — Progress Notes (Signed)
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 Surgery planned for 12/9. Continue IV Decadron for now.  - GI prophylaxis and sliding scale insulin while patient is on Decadron  - NPO for surgery - Neuro Surgery to assume attending role post op.  HTN (hypertension)  Blood pressure stable at present. Continue home meds   Hypokalemia  Resolved.   Constipation  On senna and Colace, miralax, ducolax suppository with only a very small BM.  Will continue bowel regimen and give another ducolax suppository post surgery.  tobacco use  Counseled on smoking cessation  Ordered nicotine patch   DVT prophylaxis  Hold lovenox for sx tomorrow  Code Status: full  Family Communication:   Disposition Plan:  Will be determined post op.  Consultants:  Neurology  neurosurgery ( Dr Jordan Likes)   Procedures:  For surgery tomorrow  Antibiotics:  none  HPI/Subjective: No complaints.    Objective: Filed Vitals:   11/10/12 1103 11/10/12 1337 11/10/12 2100 11/11/12 0629  BP: 132/71 129/76 117/76 165/96  Pulse: 54 65 59 51  Temp:  98.1 F (36.7 C) 98.3 F (36.8 C) 97.6 F (36.4 C)  TempSrc:  Oral Oral Oral  Resp:  20 18 18   Height:      Weight:    94.892 kg (209 lb 3.2 oz)  SpO2:  97% 95% 98%    Intake/Output Summary (Last 24 hours) at 11/11/12 8119 Last data filed at 11/10/12 1831  Gross per 24 hour  Intake    600 ml  Output      0 ml  Net    600 ml   Filed Weights   11/08/12 0222 11/10/12 0500 11/11/12 0629  Weight: 93.532 kg (206 lb 3.2 oz) 94.802 kg (209 lb) 94.892 kg (209 lb 3.2 oz)    Exam:  General: Middle aged female in NAD, Toddler son in bed with her.  Hu Cardiovascular: N S1 &S2, no  murmurs  Respiratory: Clear breath sounds bilaterally  Abdomen: Soft, nontender, bowel sounds present  Extremities: Warm, no edema  CNS: AAO x3, normal power and tone in bilateral lower extremities   Data Reviewed: Basic Metabolic Panel:  Lab 11/08/12 1478 11/07/12 2116  NA 139 142  K 4.0 3.4*  CL 104 104  CO2 25 25  GLUCOSE 136* 98  BUN 14 13  CREATININE 0.71 0.73  CALCIUM 9.8 9.7  MG -- --  PHOS -- --   CBC:  Lab 11/08/12 0609 11/07/12 2116  WBC 13.5* 11.9*  NEUTROABS -- 5.8  HGB 13.5 13.4  HCT 40.8 39.1  MCV 91.9 91.4  PLT 255 259   CBG:  Lab 11/11/12 0731 11/10/12 2108 11/10/12 1649 11/10/12 1205 11/10/12 0737  GLUCAP 130* 148* 124* 131* 125*    Recent Results (from the past 240 hour(s))  SURGICAL PCR SCREEN     Status: Normal   Collection Time   11/10/12  8:53 PM      Component Value Range Status Comment   MRSA, PCR NEGATIVE  NEGATIVE Final    Staphylococcus aureus NEGATIVE  NEGATIVE Final      Studies: No results found.  Scheduled Meds:    . [COMPLETED] bisacodyl  10 mg Rectal Once  .  ceFAZolin (ANCEF) IV  3 g Intravenous  30 min Pre-Op  . [COMPLETED] chlorhexidine      . dexamethasone  4 mg Intravenous Q6H  . hydrochlorothiazide  12.5 mg Oral Daily  . insulin aspart  0-9 Units Subcutaneous TID WC  . lisinopril  20 mg Oral Daily  . nicotine  21 mg Transdermal Daily  . pantoprazole  40 mg Oral Daily  . polyethylene glycol  17 g Oral Daily  . senna-docusate  2 tablet Oral QHS  . [DISCONTINUED] zolpidem  5 mg Oral QHS   Continuous Infusions:      Time spent: 25 minutes    Conley Canal  [facility] Pager [phone]. If 8PM-8AM, please contact night-coverage at [email or web address], password [facility] 11/11/2012, 9:37 AM  LOS: 4 days

## 2012-11-11 NOTE — ED Provider Notes (Signed)
Medical screening examination/treatment/procedure(s) were performed by non-physician practitioner and as supervising physician I was immediately available for consultation/collaboration.   Layne Dilauro L Lourie Retz, MD 11/11/12 0121 

## 2012-11-12 ENCOUNTER — Encounter (HOSPITAL_COMMUNITY): Payer: Self-pay | Admitting: Neurosurgery

## 2012-11-12 DIAGNOSIS — G959 Disease of spinal cord, unspecified: Secondary | ICD-10-CM | POA: Diagnosis present

## 2012-11-12 LAB — GLUCOSE, CAPILLARY

## 2012-11-12 MED ORDER — SENNA 8.6 MG PO TABS
1.0000 | ORAL_TABLET | Freq: Two times a day (BID) | ORAL | Status: DC
  Administered 2012-11-12 – 2012-11-14 (×6): 8.6 mg via ORAL
  Filled 2012-11-12 (×9): qty 1

## 2012-11-12 MED ORDER — POLYETHYLENE GLYCOL 3350 17 G PO PACK: 17.0000 g | PACK | Freq: Every day | ORAL | Status: DC | PRN

## 2012-11-12 MED ORDER — ACETAMINOPHEN 325 MG PO TABS: 650.0000 mg | ORAL_TABLET | ORAL | Status: DC | PRN

## 2012-11-12 MED ORDER — HYDROCODONE-ACETAMINOPHEN 5-325 MG PO TABS
1.0000 | ORAL_TABLET | ORAL | Status: DC | PRN
  Administered 2012-11-12: 1 via ORAL
  Administered 2012-11-13 – 2012-11-15 (×5): 2 via ORAL
  Administered 2012-11-16: 1 via ORAL
  Administered 2012-11-17 – 2012-11-18 (×4): 2 via ORAL
  Filled 2012-11-12 (×4): qty 2
  Filled 2012-11-12: qty 1
  Filled 2012-11-12 (×3): qty 2
  Filled 2012-11-12: qty 1
  Filled 2012-11-12: qty 2

## 2012-11-12 MED ORDER — ONDANSETRON HCL 4 MG/2ML IJ SOLN: 4.0000 mg | INTRAMUSCULAR | Status: DC | PRN

## 2012-11-12 MED ORDER — MENTHOL 3 MG MT LOZG: 1.0000 | LOZENGE | OROMUCOSAL | Status: DC | PRN

## 2012-11-12 MED ORDER — SODIUM CHLORIDE 0.9 % IJ SOLN
3.0000 mL | INTRAMUSCULAR | Status: DC | PRN
  Administered 2012-11-16 – 2012-11-18 (×2): 3 mL via INTRAVENOUS

## 2012-11-12 MED ORDER — ALUM & MAG HYDROXIDE-SIMETH 200-200-20 MG/5ML PO SUSP: 30.0000 mL | Freq: Four times a day (QID) | ORAL | Status: DC | PRN

## 2012-11-12 MED ORDER — ZOLPIDEM TARTRATE 5 MG PO TABS: 5.0000 mg | ORAL_TABLET | Freq: Every evening | ORAL | Status: DC | PRN

## 2012-11-12 MED ORDER — SODIUM CHLORIDE 0.9 % IJ SOLN
3.0000 mL | Freq: Two times a day (BID) | INTRAMUSCULAR | Status: DC
  Administered 2012-11-12 – 2012-11-18 (×13): 3 mL via INTRAVENOUS

## 2012-11-12 MED ORDER — OXYCODONE-ACETAMINOPHEN 5-325 MG PO TABS
1.0000 | ORAL_TABLET | ORAL | Status: DC | PRN
  Administered 2012-11-12 (×2): 2 via ORAL
  Administered 2012-11-12: 1 via ORAL
  Filled 2012-11-12: qty 1
  Filled 2012-11-12 (×2): qty 2

## 2012-11-12 MED ORDER — ACETAMINOPHEN 650 MG RE SUPP: 650.0000 mg | RECTAL | Status: DC | PRN

## 2012-11-12 MED ORDER — BISACODYL 10 MG RE SUPP: 10.0000 mg | Freq: Every day | RECTAL | Status: DC | PRN

## 2012-11-12 MED ORDER — HYDROMORPHONE HCL PF 1 MG/ML IJ SOLN: 0.5000 mg | INTRAMUSCULAR | Status: DC | PRN

## 2012-11-12 MED ORDER — CEFAZOLIN SODIUM 1-5 GM-% IV SOLN
1.0000 g | Freq: Three times a day (TID) | INTRAVENOUS | Status: AC
  Administered 2012-11-12 (×2): 1 g via INTRAVENOUS
  Filled 2012-11-12 (×2): qty 50

## 2012-11-12 MED ORDER — SODIUM CHLORIDE 0.9 % IV SOLN: 250.0000 mL | INTRAVENOUS | Status: DC

## 2012-11-12 MED ORDER — KETOROLAC TROMETHAMINE 30 MG/ML IJ SOLN
30.0000 mg | Freq: Four times a day (QID) | INTRAMUSCULAR | Status: AC
  Administered 2012-11-12 – 2012-11-17 (×18): 30 mg via INTRAVENOUS
  Filled 2012-11-12 (×22): qty 1

## 2012-11-12 MED ORDER — CYCLOBENZAPRINE HCL 10 MG PO TABS
10.0000 mg | ORAL_TABLET | Freq: Three times a day (TID) | ORAL | Status: DC | PRN
  Administered 2012-11-13 – 2012-11-17 (×5): 10 mg via ORAL
  Filled 2012-11-12 (×5): qty 1

## 2012-11-12 MED ORDER — PHENOL 1.4 % MT LIQD: 1.0000 | OROMUCOSAL | Status: DC | PRN

## 2012-11-12 MED ORDER — FLEET ENEMA 7-19 GM/118ML RE ENEM: 1.0000 | ENEMA | Freq: Once | RECTAL | Status: AC | PRN

## 2012-11-12 NOTE — Progress Notes (Signed)
Mid back 4x4 gauze dressing change. Site noted with minimal amount bleeding surface sutures intact, ice pack applied to back as per MD order

## 2012-11-12 NOTE — Progress Notes (Signed)
Subjective: Patient s/p surgical debulking of intramedullary spinal cord lipoma.  Reports feeling numb to her abdomen but beginning to have some tingling that goes down the back of her legs.    Objective: Current vital signs: BP 121/85  Pulse 52  Temp 98 F (36.7 C) (Oral)  Resp 20  Ht 5\' 8"  (1.727 m)  Wt 95.9 kg (211 lb 6.7 oz)  BMI 32.15 kg/m2  SpO2 100%  LMP 11/07/2012 Vital signs in last 24 hours: Temp:  [97.6 F (36.4 C)-98.6 F (37 C)] 98 F (36.7 C) (12/10 1006) Pulse Rate:  [50-77] 52  (12/10 1006) Resp:  [13-23] 20  (12/10 1006) BP: (109-162)/(74-98) 121/85 mmHg (12/10 1006) SpO2:  [98 %-100 %] 100 % (12/10 1006) Weight:  [95.9 kg (211 lb 6.7 oz)] 95.9 kg (211 lb 6.7 oz) (12/09 2350)  Intake/Output from previous day: 12/09 0701 - 12/10 0700 In: 2000 [I.V.:2000] Out: 200 [Blood:200] Intake/Output this shift:   Nutritional status: General  Neurologic Exam: Mental Status: Alert, oriented, thought content appropriate.  Speech fluent without evidence of aphasia.  Able to follow 3 step commands without difficulty. Cranial Nerves: II: Discs flat bilaterally; Visual fields grossly normal, pupils equal, round, reactive to light and accommodation III,IV, VI: ptosis not present, extra-ocular motions intact bilaterally V,VII: smile symmetric, facial light touch sensation normal bilaterally VIII: hearing normal bilaterally IX,X: gag reflex present XI: bilateral shoulder shrug XII: midline tongue extension Motor: Wiggles toes on both lower extremities, right greater than left.   Sensory: Pinprick and light touch decreased to just above unbilicus  Lab Results: Basic Metabolic Panel:  Lab 11/08/12 1191 11/07/12 2116  NA 139 142  K 4.0 3.4*  CL 104 104  CO2 25 25  GLUCOSE 136* 98  BUN 14 13  CREATININE 0.71 0.73  CALCIUM 9.8 9.7  MG -- --  PHOS -- --    Liver Function Tests: No results found for this basename: AST:5,ALT:5,ALKPHOS:5,BILITOT:5,PROT:5,ALBUMIN:5  in the last 168 hours No results found for this basename: LIPASE:5,AMYLASE:5 in the last 168 hours No results found for this basename: AMMONIA:3 in the last 168 hours  CBC:  Lab 11/08/12 0609 11/07/12 2116  WBC 13.5* 11.9*  NEUTROABS -- 5.8  HGB 13.5 13.4  HCT 40.8 39.1  MCV 91.9 91.4  PLT 255 259    Cardiac Enzymes: No results found for this basename: CKTOTAL:5,CKMB:5,CKMBINDEX:5,TROPONINI:5 in the last 168 hours  Lipid Panel: No results found for this basename: CHOL:5,TRIG:5,HDL:5,CHOLHDL:5,VLDL:5,LDLCALC:5 in the last 168 hours  CBG:  Lab 11/11/12 1050 11/11/12 0731 11/10/12 2108 11/10/12 1649 11/10/12 1205  GLUCAP 131* 130* 148* 124* 131*    Microbiology: Results for orders placed during the hospital encounter of 11/07/12  SURGICAL PCR SCREEN     Status: Normal   Collection Time   11/10/12  8:53 PM      Component Value Range Status Comment   MRSA, PCR NEGATIVE  NEGATIVE Final    Staphylococcus aureus NEGATIVE  NEGATIVE Final     Coagulation Studies: No results found for this basename: LABPROT:5,INR:5 in the last 72 hours  Imaging: No results found.  Medications:  Scheduled:   . [COMPLETED]  ceFAZolin (ANCEF) IV  3 g Intravenous 30 min Pre-Op  .  ceFAZolin (ANCEF) IV  1 g Intravenous Q8H  . dexamethasone  4 mg Intravenous Q6H  . hydrochlorothiazide  12.5 mg Oral Daily  . insulin aspart  0-9 Units Subcutaneous TID WC  . ketorolac  30 mg Intravenous Q6H  . [COMPLETED]  ketorolac  30 mg Intravenous Once  . lisinopril  20 mg Oral Daily  . [COMPLETED] meperidine      . nicotine  21 mg Transdermal Daily  . pantoprazole  40 mg Oral Daily  . polyethylene glycol  17 g Oral Daily  . senna  1 tablet Oral BID  . senna-docusate  2 tablet Oral QHS  . 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  On steroids.     Plan: No further  neurologic intervention recommended at this time.      LOS: 5 days   Thana Farr, MD [facility] [phone] 11/12/2012  10:24 AM

## 2012-11-12 NOTE — Progress Notes (Signed)
Postop day 1. Pain well controlled. Denies any radiating pain or dysesthesia.  She is afebrile. Her vitals are stable. Motor examination reveals weakness of both lower cavities. Patient able to wiggle toes on her right side and intermittently on her left side. She has sensation to pain and pressure in both lower trimming his. She remains hyperreflexic with some clonus.  Status post debulking of intramedullary spinal cord lipoma. Postoperatively with increased weakness hopefully secondary to surgical manipulation. She is certainly better today than yesterday and my hope is that motor function will continue to improve over the next few days. Continue IV steroids and bed rest.

## 2012-11-12 NOTE — Progress Notes (Signed)
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 - Appreciate neurology and neurosurgery evaluation. 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 Jordan Likes. Continue Decadron.  neurosurgery to address when to stop it.. Continue PPI. pain control with Vicodin  and Flexeril. -Still has some weakness of the lower extremities with hyperreflexia. Patient to be on strict bedrest for 3 days. Will need SNF versus inpatient rehabilitation on discharge.   HTN (hypertension)  Blood pressure stable at present. Continue home meds   Hypokalemia  Resolved.   Constipation  On senna and Colace, miralax, ducolax suppository   tobacco use  Counseled on smoking cessation  Onnicotine patch   DVT prophylaxis  Resume Lovenox post op    Code Status: full Family Communication: none at bedside today Disposition Plan: pending PT eval. Will need SNF vs CIR   Consultants:  Neurology ( [facility])  Neurosurgery [facility])  Procedures: debulking of intramedullary spinal cord lipoma on 12/9   Antibiotics:  none  HPI/Subjective: Tolerated surgery well. Denies any pain at present  Objective: Filed Vitals:   11/12/12 0239 11/12/12 0619 11/12/12 1006 11/12/12 1605  BP: 122/74 136/82 121/85 147/83  Pulse: 52 50 52 60  Temp: 97.9 F (36.6 C) 98.1 F (36.7 C) 98 F (36.7 C) 98 F (36.7 C)  TempSrc: Oral Oral Oral Oral  Resp: 20 20 20 20   Height:      Weight:      SpO2: 100% 98% 100% 100%    Intake/Output Summary (Last 24 hours) at 11/12/12 1659 Last data filed at 11/11/12 2151  Gross per 24 hour  Intake   2000 ml  Output    200 ml  Net   1800 ml   Filed Weights   11/10/12 0500 11/11/12 0629 11/11/12 2350  Weight: 94.802 kg (209 lb)  94.892 kg (209 lb 3.2 oz) 95.9 kg (211 lb 6.7 oz)    Exam:  General: Middle aged female in NAD,  Cardiovascular: N S1 &S2, no murmurs  Respiratory: Clear breath sounds bilaterally  Abdomen: Soft, nontender, bowel sounds present  Extremities: Warm, no edema CNS: AAO x3, normal power and tone in bilateral lower extremities, dressing over back clean CNS: AAOX 3, still has some upwards plantar reflex and hyperreflexia over LLE.   Data Reviewed: Basic Metabolic Panel:  Lab 11/08/12 8119 11/07/12 2116  NA 139 142  K 4.0 3.4*  CL 104 104  CO2 25 25  GLUCOSE 136* 98  BUN 14 13  CREATININE 0.71 0.73  CALCIUM 9.8 9.7  MG -- --  PHOS -- --   Liver Function Tests: No results found for this basename: AST:5,ALT:5,ALKPHOS:5,BILITOT:5,PROT:5,ALBUMIN:5 in the last 168 hours No results found for this basename: LIPASE:5,AMYLASE:5 in the last 168 hours No results found for this basename: AMMONIA:5 in the last 168 hours CBC:  Lab 11/08/12 0609 11/07/12 2116  WBC 13.5* 11.9*  NEUTROABS -- 5.8  HGB 13.5 13.4  HCT 40.8 39.1  MCV 91.9 91.4  PLT 255 259   Cardiac Enzymes: No results found for this basename: CKTOTAL:5,CKMB:5,CKMBINDEX:5,TROPONINI:5 in the last 168 hours BNP (last 3 results) No results found for this basename: PROBNP:3 in the last 8760 hours CBG:  Lab 11/12/12 0828 11/11/12 1050 11/11/12 0731 11/10/12 2108 11/10/12 1649  GLUCAP 140* 131* 130*  148* 124*    Recent Results (from the past 240 hour(s))  SURGICAL PCR SCREEN     Status: Normal   Collection Time   11/10/12  8:53 PM      Component Value Range Status Comment   MRSA, PCR NEGATIVE  NEGATIVE Final    Staphylococcus aureus NEGATIVE  NEGATIVE Final      Studies: No results found.  Scheduled Meds:   . [COMPLETED]  ceFAZolin (ANCEF) IV  3 g Intravenous 30 min Pre-Op  . [COMPLETED]  ceFAZolin (ANCEF) IV  1 g Intravenous Q8H  . dexamethasone  4 mg Intravenous Q6H  . hydrochlorothiazide  12.5 mg Oral Daily  .  insulin aspart  0-9 Units Subcutaneous TID WC  . ketorolac  30 mg Intravenous Q6H  . [COMPLETED] ketorolac  30 mg Intravenous Once  . lisinopril  20 mg Oral Daily  . [COMPLETED] meperidine      . nicotine  21 mg Transdermal Daily  . pantoprazole  40 mg Oral Daily  . polyethylene glycol  17 g Oral Daily  . senna  1 tablet Oral BID  . senna-docusate  2 tablet Oral QHS  . sodium chloride  3 mL Intravenous Q12H   Continuous Infusions:   . sodium chloride         Time spent: 25 minutes    Darelle Kings  [facility] Pager [phone] If 8PM-8AM, please contact night-coverage at [email or web address], password [facility] 11/12/2012, 4:59 PM  LOS: 5 days

## 2012-11-13 LAB — GLUCOSE, CAPILLARY
Glucose-Capillary: 182 mg/dL — ABNORMAL HIGH (ref 70–99)
Glucose-Capillary: 138 mg/dL — ABNORMAL HIGH (ref 70–99)

## 2012-11-13 MED ORDER — LACTULOSE 10 GM/15ML PO SOLN
10.0000 g | Freq: Three times a day (TID) | ORAL | Status: DC
  Administered 2012-11-13 – 2012-11-14 (×3): 10 g via ORAL
  Filled 2012-11-13 (×4): qty 15

## 2012-11-13 NOTE — Progress Notes (Signed)
Postop day 3. Overall no changes last night. Pain well controlled.  Afebrile with stable vitals. Improved sensation and movement in her right lower chamois today. Patient with 2/5 strength in her right hip flexors and right quadriceps. Still wiggles toes on the right side. Intermittently wiggles toes on the left side. Patient with position sense on the right diminished position sense on the left. Wound clean and dry.  Status post debulking of intrinsic spinal cord lipoma. Continue bedrest. Continue steroids. Tentatively plan to begin mobilization tomorrow.

## 2012-11-13 NOTE — Progress Notes (Signed)
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  - Appreciate neurology and neurosurgery evaluation. 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 Jordan Likes. Continue Decadron. neurosurgery to address when to stop it.. Continue PPI. pain control with Vicodin and Flexeril.  -Still has some weakness of the lower extremities with hyperreflexia. Patient to be on strict bedrest for 3 days. Will need SNF versus inpatient rehabilitation on discharge.  -PT/OT when cleared by neurosurgery HTN (hypertension)  Blood pressure stable at present. Continue home meds  Hypokalemia  Resolved.  Constipation  On senna and Colace, miralax, ducolax suppository   tobacco use  Counseled on smoking cessation  Onnicotine patch  DVT prophylaxis  Resume Lovenox post op  Code Status: full  Family Communication: none at bedside today  Disposition Plan: pending PT eval. 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  Worsening bowel and bladder incontinence.  Possible transverse myelitis.  Contrast: 15 ml MultiHance.  Comparison: Lumbar MRI 11/07/2012 and earlier.  MRI CERVICAL SPINE WITHOUT AND WITH CONTRAST  Technique:  Multiplanar and multiecho pulse sequences of the cervic al spine, to include the craniocervical junction and cervicothoraci c junction, were obtained according to standard protocol without and with intravenous contrast.  Findings:   Cervicomedullary junction is within normal limits. Normal cervical spinal cord.  See thoracic spinal cord abnormality below.  Normal cervical vertebral height and alignment. No marrow edema or evidence of acute osseous abnormality.  Visualized paraspinal soft tissues are within normal limits.  Minor cervical degenerative changes.  No cervical disc herniation, spinal stenosis, or significant neural foraminal stenosis.  IMPRESSION: 1.  Negative cervical MRI. 2.  Abnormal thoracic MRI, see below.  MRI THORACIC SPINE WITHOUT AND WITH CONTRAST  Technique: Multiplanar and multiecho pulse sequences of the thoracic spine were obtained without and  with intravenous contrast.  Findings:  Abnormal thoracic spinal cord expansion from T4-T5 to T6- T7 appears to be the result of a an oblong intradural and mostly or completely intramedullary mass with intrinsic T1  hyperintensity and signal suppression on both STIR and fat saturated postcontrast T1 images.  The mass is about 5 cm in length and oval measuring up to 9 mm diameter.  There is evidence of mild cord edema immediately surrounding the lesion which extends to the mid T4 and mid T7 levels.  Following contrast there is little if any enhancement. The lesion might be slightly exophytic to the cord along the dorsal extent ( see series 15 image 19).  The epidural space at the level of the mass is within normal limits. No definite subarachnoid space abnormality.  There is no associated spinal dysraphism.  The other thoracic spinal cord levels are within normal limits. The conus was better visualized on the lumbar comparison but appears within normal limits.  No other areas of abnormal intradural enhancement.  Normal thoracic vertebral height, alignment, and marrow signal.Visualized paraspinal soft tissues are within normal limits.  Negative visualized thoracic and upper  abdominal viscera.  IMPRESSION:  1.  Unusual T5-T6 intradural spinal cord mass  50 mm in length by 9 mm  diameter  appears to be largely lipomatous in nature and intramedullary with possible dorsal exophytic component.  Little if any enhancement.  Associated spinal cord expansion and mild cord edema.   No associated spinal dysraphism identified. 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  CT thoracic spine without contrast might be valuable to evaluate for any calcification within the mass (which would strongly indicate a dermoid). 2.  No other thoracic spine abnormality.  Study discussed with Dr. Thana Farr at [phone] hours on 11/08/2012.   Original Report Authenticated By: Erskine Speed, M.D.    Mr Lumbar Spine Wo Contrast  11/07/2012  *RADIOLOGY REPORT*  Clinical Data: Worsening bowel and bladder incontinence for the past week.  MRI LUMBAR SPINE WITHOUT CONTRAST  Technique:  Multiplanar and multiecho pulse sequences of the lumbar spine were obtained without intravenous contrast.  Comparison: 08/01/2012.  Findings: Last fully open disc space is labeled L5-S1.  Present examination incorporates from T10-11 disc space through the lower sacrum.  Conus L1 level.  No compression of the distal cord or conus.  No abnormal signal within the distal cord or conus.  Visualized paravertebral structures without gross abnormality. Evaluation slightly limited by respiratory motion.  No worrisome bony lesion.  T10-11:  Mild left-sided facet bony overgrowth.  T11-12:  Minimal left facet bony overgrowth.  T12-L1:  Negative.  L1-2:  Negative.  L2-3:  Very small left foraminal/lateral protrusion approaching but not compressing the exiting left L2 nerve root.  Minimal bulge. Very mild facet joint degenerative changes.  L3-4:  Prominent right-sided facet joint degenerative changes containing fluid undermining the right ligamenta flava.  Mild left- sided facet joint degenerative changes.  Bulge with lateral extension approaching but not compressing the exiting L3 nerve roots.  Dorsal epidural fat.  Mild  spinal stenosis  L4-5:  Mild facet joint degenerative changes greater on the right. Mild ligamentum flavum hypertrophy.  Bulge.  Left lateral disc osteophyte complex approaches but does not compress the exiting left L4 nerve root.  Very mild spinal stenosis.  L5-S1:  No significant spinal stenosis or foraminal narrowing.  IMPRESSION: No conus compression detected as cause of patient's symptoms.  Scattered degenerative changes most notable at the L3-4 level as detailed above. Findings without significant change.   Original Report Authenticated By: Lacy Duverney, M.D.    Mr Thoracic Spine W Wo Contrast  11/08/2012  *RADIOLOGY REPORT*  Clinical Data:  36 year old female with back pain, bilateral lower extremity numbness and weakness.  Worsening bowel and bladder incontinence.  Possible transverse myelitis.  Contrast: 15 ml MultiHance.  Comparison: Lumbar MRI 11/07/2012 and earlier.  MRI CERVICAL SPINE WITHOUT AND WITH CONTRAST  Technique:  Multiplanar and multiecho pulse sequences of the cervic al spine, to include the craniocervical junction and cervicothoraci c junction, were obtained according to standard protocol without and with intravenous contrast.  Findings:  Cervicomedullary junction is within normal limits. Normal cervical spinal cord.  See thoracic spinal cord abnormality below.  Normal cervical vertebral height and alignment. No marrow edema or evidence of acute osseous abnormality.  Visualized paraspinal soft tissues are within normal limits.  Minor cervical degenerative changes.  No cervical disc herniation, spinal stenosis, or significant neural foraminal stenosis.  IMPRESSION: 1.  Negative cervical MRI. 2.  Abnormal thoracic MRI, see below.  MRI THORACIC SPINE WITHOUT AND WITH CONTRAST  Technique: Multiplanar and multiecho pulse  sequences of the thoracic spine were obtained without and  with intravenous contrast.  Findings:  Abnormal thoracic spinal cord expansion from T4-T5 to T6- T7 appears to be  the result of a an oblong intradural and mostly or completely intramedullary mass with intrinsic T1  hyperintensity and signal suppression on both STIR and fat saturated postcontrast T1 images.  The mass is about 5 cm in length and oval measuring up to 9 mm diameter.  There is evidence of mild cord edema immediately surrounding the lesion which extends to the mid T4 and mid T7 levels.  Following contrast there is little if any enhancement. The lesion might be slightly exophytic to the cord along the dorsal extent ( see series 15 image 19).  The epidural space at the level of the mass is within normal limits. No definite subarachnoid space abnormality.  There is no associated spinal dysraphism.  The other thoracic spinal cord levels are within normal limits. The conus was better visualized on the lumbar comparison but appears within normal limits.  No other areas of abnormal intradural enhancement.  Normal thoracic vertebral height, alignment, and marrow signal.Visualized paraspinal soft tissues are within normal limits.  Negative visualized thoracic and upper abdominal viscera.  IMPRESSION:  1.  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  Little if any enhancement.  Associated spinal cord expansion and mild cord edema.   No associated spinal dysraphism identified. 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  CT thoracic spine without contrast might be valuable to evaluate for any calcification within the mass (which would strongly indicate a dermoid). 2.  No other thoracic spine abnormality.  Study discussed with Dr. Thana Farr at [phone] hours on 11/08/2012.   Original Report Authenticated By: Erskine Speed, M.D.          Subjective: Patient denies any fevers, chills, chest pain, shortness breath, nausea, vomiting, diarrhea, abdominal  pain.  Objective: Filed Vitals:   11/13/12 0619 11/13/12 0949 11/13/12 1429 11/13/12 1737  BP: 131/67 136/78 147/86 146/84  Pulse: 54 57 56 69  Temp: 98 F (36.7 C) 98.2 F (36.8 C) 98 F (36.7 C) 98.2 F (36.8 C)  TempSrc: Oral Oral Oral Oral  Resp: 20 20 20 20   Height:      Weight:      SpO2: 100% 100% 100% 100%    Intake/Output Summary (Last 24 hours) at 11/13/12 1844 Last data filed at 11/13/12 0640  Gross per 24 hour  Intake      0 ml  Output   1000 ml  Net  -1000 ml   Weight change:  Exam:   General:  Pt is alert, follows commands appropriately, not in acute distress  HEENT: No icterus, No thrush, [place]/AT  Cardiovascular: RRR, S1/S2, no rubs, no gallops  Respiratory: CTA bilaterally, no wheezing, no crackles, no rhonchi  Abdomen: Soft/+BS, non tender, non distended, no guarding  Extremities: No edema, No lymphangitis, No petechiae, No rashes, no synovitis  Data Reviewed: Basic Metabolic Panel:  Lab 11/08/12 9604 11/07/12 2116  NA 139 142  K 4.0 3.4*  CL 104 104  CO2 25 25  GLUCOSE 136* 98  BUN 14 13  CREATININE 0.71 0.73  CALCIUM 9.8 9.7  MG -- --  PHOS -- --   Liver Function Tests: No results found for this basename: AST:5,ALT:5,ALKPHOS:5,BILITOT:5,PROT:5,ALBUMIN:5 in the last  168 hours No results found for this basename: LIPASE:5,AMYLASE:5 in the last 168 hours No results found for this basename: AMMONIA:5 in the last 168 hours CBC:  Lab 11/08/12 0609 11/07/12 2116  WBC 13.5* 11.9*  NEUTROABS -- 5.8  HGB 13.5 13.4  HCT 40.8 39.1  MCV 91.9 91.4  PLT 255 259   Cardiac Enzymes: No results found for this basename: CKTOTAL:5,CKMB:5,CKMBINDEX:5,TROPONINI:5 in the last 168 hours BNP: No components found with this basename: POCBNP:5 CBG:  Lab 11/13/12 1709 11/13/12 1131 11/13/12 0642 11/12/12 2214 11/12/12 0828  GLUCAP 138* 182* 151* 167* 140*    Recent Results (from the past 240 hour(s))  SURGICAL PCR SCREEN     Status: Normal    Collection Time   11/10/12  8:53 PM      Component Value Range Status Comment   MRSA, PCR NEGATIVE  NEGATIVE Final    Staphylococcus aureus NEGATIVE  NEGATIVE Final      Scheduled Meds:   . dexamethasone  4 mg Intravenous Q6H  . hydrochlorothiazide  12.5 mg Oral Daily  . insulin aspart  0-9 Units Subcutaneous TID WC  . ketorolac  30 mg Intravenous Q6H  . lisinopril  20 mg Oral Daily  . nicotine  21 mg Transdermal Daily  . pantoprazole  40 mg Oral Daily  . polyethylene glycol  17 g Oral Daily  . senna  1 tablet Oral BID  . senna-docusate  2 tablet Oral QHS  . sodium chloride  3 mL Intravenous Q12H   Continuous Infusions:   . sodium chloride       Stevie Charter, DO  [facility] Pager [phone]  If 7PM-7AM, please contact night-coverage [email or web address] Password [facility] 11/13/2012, 6:44 PM   LOS: 6 days

## 2012-11-14 ENCOUNTER — Encounter (HOSPITAL_COMMUNITY): Payer: Self-pay | Admitting: Physical Medicine and Rehabilitation

## 2012-11-14 DIAGNOSIS — G8220 Paraplegia, unspecified: Secondary | ICD-10-CM

## 2012-11-14 LAB — GLUCOSE, CAPILLARY
Glucose-Capillary: 127 mg/dL — ABNORMAL HIGH (ref 70–99)
Glucose-Capillary: 140 mg/dL — ABNORMAL HIGH (ref 70–99)
Glucose-Capillary: 131 mg/dL — ABNORMAL HIGH (ref 70–99)

## 2012-11-14 LAB — BASIC METABOLIC PANEL
Calcium: 8.8 mg/dL (ref 8.4–10.5)
GFR calc Af Amer: >90 mL/min (ref >90)
GFR calc non Af Amer: >90 mL/min (ref >90)
Glucose, Bld: 115 mg/dL — ABNORMAL HIGH (ref 70–99)
Sodium: 138 mEq/L (ref 135–145)

## 2012-11-14 MED ORDER — MAGNESIUM CITRATE PO SOLN
1.0000 | Freq: Once | ORAL | Status: AC
  Administered 2012-11-14: 1 via ORAL
  Filled 2012-11-14 (×2): qty 296

## 2012-11-14 NOTE — Consult Note (Signed)
Physical Medicine and Rehabilitation Consult Reason for Consult:  Referring Physician: Pool   HPI: Debbie [name] is a 36 y.o. female with a history of hypertension who presented to the emergency room on 11/07/12 with complaint of increasing weakness and numbness of lower extremities over the past 6 months as well as increasing difficulty with standing and walking with recurrent falls. A week PTA she developed incontinence of urine and bowel, BLE spams L>R as well as intermittent back and hip pain. She was evaluated by Dr. Roseanne Reno who recommended IV steriods as well as MRI thoracic spine for work up of chronic progressive myelopathy. 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 Patient underwent T5-T7 lam for resection and debulking of tumor on 11/11/12 by Dr. Jordan Likes and on bedrest through today. Motor exam with slow return but patient reporting some improvement in sensation RLE>  The patient developed bowel and bladder incontinence about 2 weeks prior to admission  Review of Systems  HENT: Negative for hearing loss.   Eyes: Negative for blurred vision.  Respiratory: Negative for shortness of breath.   Cardiovascular: Negative for chest pain and palpitations.  Gastrointestinal: Positive for constipation. Negative for nausea and vomiting.  Musculoskeletal:       Left foot drop  With inversion for past 6 months.   Neurological: Positive for tingling, sensory change and focal weakness. Negative for dizziness and headaches.   Past Medical History  Diagnosis Date  . Hypertension    Past Surgical History  Procedure Date  . Fracture surgery   . Cesarean section   . Laminectomy 11/11/2012    Procedure: THORACIC LAMINECTOMY FOR TUMOR;  Surgeon: Temple Pacini, MD;  Location: [facility];  Service: Neurosurgery;  Laterality: N/A;  thracic laminectomy for intradural and intramedullary neoplasm   History reviewed. No pertinent family history.  Social History:  Lives with children who  are 21 and 68 years old. Mother and friends have been helping out. 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 reports that she has been smoking Cigarettes--1-1 1/2 PPD.  She has a 15 pack-year smoking history. She does not have any smokeless tobacco history on file. She reports that she drinks alcohol--on rare occasion.  She reports that she does not use illicit drugs.   Allergies  Allergen Reactions  . Honey Bee Treatment (Bee Venom) Anaphylaxis and Itching   Medications Prior to Admission  Medication Sig Dispense Refill  . acetaminophen (TYLENOL) 500 MG tablet Take 2,000 mg by mouth every 6 (six) hours as needed. For pain.      Marland Kitchen ibuprofen (ADVIL,MOTRIN) 200 MG tablet Take 800-1,000 mg by mouth every 6 (six) hours as needed. For pain      . lisinopril-hydrochlorothiazide (PRINZIDE,ZESTORETIC) 20-12.5 MG per tablet Take 1 tablet by mouth daily.      . Naproxen Sodium (ALEVE) 220 MG CAPS Take 660 mg by mouth 3 (three) times daily as needed. For pain        Home:    Functional History:   Functional Status:  Mobility:          ADL:    Cognition: Cognition Orientation Level: Oriented X4    Blood pressure 130/80, pulse 60, temperature 98 F (36.7 C), temperature source Oral, resp. rate 17, height 5\' 8"  (1.727 m), weight 95.9 kg (211 lb 6.7 oz), last menstrual period 11/07/2012, SpO2 100.00%. Physical Exam  Nursing note and vitals reviewed. Constitutional: She is oriented to person, place, and time. She appears  well-developed and well-nourished.  Eyes: Pupils are equal, round, and reactive to light.  Neck: Normal range of motion.  Cardiovascular: Normal rate and regular rhythm.   Pulmonary/Chest: Effort normal and breath sounds normal.  Abdominal: Soft. Bowel sounds are normal.  Neurological: She is alert and oriented to person, place, and time. She displays abnormal reflex. No cranial nerve deficit. Coordination abnormal.  Skin: Skin is warm and dry.  Motor: 5/5 in  bilateral deltoid, biceps, triceps and grip 2 minus knee and hip extensor synergy in the right lower extremity, otherwise 0/5 in the lower extremities Sensation diminished below the xiphoid process  Results for orders placed during the hospital encounter of 11/07/12 (from the past 24 hour(s))  GLUCOSE, CAPILLARY     Status: Abnormal   Collection Time   11/13/12  5:09 PM      Component Value Range   Glucose-Capillary 138 (*) 70 - 99 mg/dL  GLUCOSE, CAPILLARY     Status: Abnormal   Collection Time   11/13/12 10:19 PM      Component Value Range   Glucose-Capillary 156 (*) 70 - 99 mg/dL   Comment 1 Documented in Chart     Comment 2 Notify RN    BASIC METABOLIC PANEL     Status: Abnormal   Collection Time   11/14/12  4:45 AM      Component Value Range   Sodium 138  135 - 145 mEq/L   Potassium 4.1  3.5 - 5.1 mEq/L   Chloride 103  96 - 112 mEq/L   CO2 26  19 - 32 mEq/L   Glucose, Bld 115 (*) 70 - 99 mg/dL   BUN 18  6 - 23 mg/dL   Creatinine, Ser 1.61  0.50 - 1.10 mg/dL   Calcium 8.8  8.4 - 09.6 mg/dL   GFR calc non Af Amer >90  >90 mL/min   GFR calc Af Amer >90  >90 mL/min  GLUCOSE, CAPILLARY     Status: Abnormal   Collection Time   11/14/12  6:49 AM      Component Value Range   Glucose-Capillary 101 (*) 70 - 99 mg/dL   Comment 1 Documented in Chart     Comment 2 Notify RN    GLUCOSE, CAPILLARY     Status: Abnormal   Collection Time   11/14/12 11:31 AM      Component Value Range   Glucose-Capillary 127 (*) 70 - 99 mg/dL   No results found.  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 Does the need for close, 24 hr/day medical supervision in concert with the patient's rehab needs make it unreasonable for this patient to be served in a less intensive setting? Yes 2. Co-Morbidities requiring supervision/potential complications: Potential for bowel obstruction as well as urinary obstruction,  hypertension 3. Due to bladder management, bowel management, safety, skin/wound care, disease management, medication administration, pain management and patient education, does the patient require 24 hr/day rehab nursing? Yes 4. Does the patient require coordinated care of a physician, rehab nurse, PT (1-2 hrs/day, 5 days/week) and OT (1-2 hrs/day, 5 days/week) to address physical and functional deficits in the context of the above medical diagnosis(es)? Yes Addressing deficits in the following areas: balance, endurance, locomotion, strength, transferring, bowel/bladder control, bathing, dressing and toileting 5. Can the patient actively participate in an intensive therapy program of at least 3 hrs of therapy per day at least 5 days per week? Potentially and  needs PT and OT consults to assess activity tolerance 6. The potential for patient to make measurable gains while on inpatient rehab is excellent 7. Anticipated functional outcomes upon discharge from inpatient rehab are Modified independent wheelchair level mobility with PT, Modified independent wheelchair level ADL with OT, Not applicable with SLP. 8. Estimated rehab length of stay to reach the above functional goals is: 2 weeks 9. Does the patient have adequate social supports to accommodate these discharge functional goals? Potentially 10. Anticipated D/C setting: Home 11. Anticipated post D/C treatments: HH therapy 12. Overall Rehab/Functional Prognosis: excellent  RECOMMENDATIONS: This patient's condition is appropriate for continued rehabilitative care in the following setting: CIR once patient demonstrates ability to tolerate PT and OT Patient has agreed to participate in recommended program. Yes Note that insurance prior authorization may be required for reimbursement for recommended care.  Comment:    11/14/2012

## 2012-11-14 NOTE — Progress Notes (Signed)
No new problems. No significant change in status. Patient states that she is getting more feeling into her right leg.  Afebrile. Vital stable. Urine output good. Wound clean and dry. Motor examination with voluntary movement of her right lower extremity. It intermittently patient shows antigravity strength on the right. She has some occasional flexors and moving her left lower extremity  Overall stable. Motor examination slow to recover. We'll begin mobilization. Begin therapy. Anticipate rehabilitation discharge.

## 2012-11-14 NOTE — Progress Notes (Signed)
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 Jordan Likes.  -Continue Decadron. neurosurgery to address when to stop it  -Continue PPI. pain control with Vicodin and Flexeril.  -Still has some weakness of the lower extremities with hyperreflexia  -likely inpatient rehabilitation on discharge.  -PT/OT  HTN (hypertension)  Blood pressure stable at present. Continue home meds  Hypokalemia  Resolved.  Constipation  -give mag citrate tobacco use  Counseled on smoking cessation  Onnicotine patch  DVT prophylaxis  Resume Lovenox post op    Code Status: full  Family Communication: none at bedside today  Disposition Plan: pending PT eval. 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  Worsening bowel and bladder incontinence.  Possible transverse myelitis.  Contrast: 15 ml MultiHance.  Comparison: Lumbar MRI 11/07/2012 and earlier.  MRI CERVICAL SPINE WITHOUT AND WITH CONTRAST  Technique:  Multiplanar and multiecho pulse sequences of the cervic al spine, to include the craniocervical junction and cervicothoraci c junction, were obtained according to standard protocol without and with intravenous contrast.  Findings:  Cervicomedullary junction is within normal limits. Normal cervical spinal cord.  See thoracic spinal cord abnormality below.  Normal cervical vertebral height and alignment. No marrow edema or evidence of  acute osseous abnormality.  Visualized paraspinal soft tissues are within normal limits.  Minor cervical degenerative changes.  No cervical disc herniation, spinal stenosis, or significant neural foraminal stenosis.  IMPRESSION: 1.  Negative cervical MRI. 2.  Abnormal thoracic MRI, see below.  MRI THORACIC SPINE WITHOUT AND WITH CONTRAST  Technique: Multiplanar and multiecho pulse sequences of the thoracic spine were obtained without and  with intravenous contrast.  Findings:  Abnormal thoracic spinal cord expansion from T4-T5 to T6- T7 appears to be the result of a an oblong intradural and mostly or completely intramedullary mass with intrinsic T1  hyperintensity and signal suppression on both STIR and fat saturated postcontrast T1 images.  The mass is about 5 cm in length and oval measuring up to 9 mm diameter.  There is evidence of mild cord edema immediately surrounding the lesion which extends to the mid T4 and mid T7 levels.  Following contrast there is little if any enhancement. The lesion might be slightly exophytic to the cord along the dorsal extent ( see series 15 image 19).  The epidural space at the level of the mass is within normal limits. No definite subarachnoid space abnormality.  There is no associated spinal dysraphism.  The other thoracic spinal cord levels are within normal limits. The conus was better visualized on the lumbar comparison but appears within normal limits.  No other areas of abnormal intradural enhancement.  Normal thoracic vertebral height, alignment, and marrow signal.Visualized paraspinal soft tissues are within normal limits.  Negative visualized thoracic and upper abdominal viscera.  IMPRESSION:  1.  Unusual T5-T6 intradural spinal cord mass  50 mm in length by 9 mm diameter  appears to be largely lipomatous in nature  and intramedullary with possible dorsal exophytic component.  Little if any enhancement.  Associated spinal cord expansion and mild cord edema.   No associated  spinal dysraphism identified. 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  CT thoracic spine without contrast might be valuable to evaluate for any calcification within the mass (which would strongly indicate a dermoid). 2.  No other thoracic spine abnormality.  Study discussed with Dr. Thana Farr at [phone] hours on 11/08/2012.   Original Report Authenticated By: Erskine Speed, M.D.    Mr Lumbar Spine Wo Contrast  11/07/2012  *RADIOLOGY REPORT*  Clinical Data: Worsening bowel and bladder incontinence for the past week.  MRI LUMBAR SPINE WITHOUT CONTRAST  Technique:  Multiplanar and multiecho pulse sequences of the lumbar spine were obtained without intravenous contrast.  Comparison: 08/01/2012.  Findings: Last fully open disc space is labeled L5-S1.  Present examination incorporates from T10-11 disc space through the lower sacrum.  Conus L1 level.  No compression of the distal cord or conus.  No abnormal signal within the distal cord or conus.  Visualized paravertebral structures without gross abnormality. Evaluation slightly limited by respiratory motion.  No worrisome bony lesion.  T10-11:  Mild left-sided facet bony overgrowth.  T11-12:  Minimal left facet bony overgrowth.  T12-L1:  Negative.  L1-2:  Negative.  L2-3:  Very small left foraminal/lateral protrusion approaching but not compressing the exiting left L2 nerve root.  Minimal bulge. Very mild facet joint degenerative changes.  L3-4:  Prominent right-sided facet joint degenerative changes containing fluid undermining the right ligamenta flava.  Mild left- sided facet joint degenerative changes.  Bulge with lateral extension approaching but not compressing the exiting L3 nerve roots.  Dorsal epidural fat.  Mild spinal stenosis  L4-5:  Mild facet joint degenerative changes greater on the right. Mild ligamentum flavum hypertrophy.  Bulge.  Left lateral disc osteophyte complex approaches but does not compress the exiting left  L4 nerve root.  Very mild spinal stenosis.  L5-S1:  No significant spinal stenosis or foraminal narrowing.  IMPRESSION: No conus compression detected as cause of patient's symptoms.  Scattered degenerative changes most notable at the L3-4 level as detailed above. Findings without significant change.   Original Report Authenticated By: Lacy Duverney, M.D.    Mr Thoracic Spine W Wo Contrast  11/08/2012  *RADIOLOGY REPORT*  Clinical Data:  36 year old female with back pain, bilateral lower extremity numbness and weakness.  Worsening bowel and bladder incontinence.  Possible transverse myelitis.  Contrast: 15 ml MultiHance.  Comparison: Lumbar MRI 11/07/2012 and earlier.  MRI CERVICAL SPINE WITHOUT AND WITH CONTRAST  Technique:  Multiplanar and multiecho pulse sequences of the cervic al spine, to include the craniocervical junction and cervicothoraci c junction, were obtained according to standard protocol without and with intravenous contrast.  Findings:  Cervicomedullary junction is within normal limits. Normal cervical spinal cord.  See thoracic spinal cord abnormality below.  Normal cervical vertebral height and alignment. No marrow edema or evidence of acute osseous abnormality.  Visualized paraspinal soft tissues are within normal limits.  Minor cervical degenerative changes.  No cervical disc herniation, spinal stenosis, or significant neural foraminal stenosis.  IMPRESSION: 1.  Negative cervical MRI. 2.  Abnormal thoracic MRI, see below.  MRI THORACIC SPINE WITHOUT AND WITH CONTRAST  Technique: Multiplanar and multiecho pulse sequences of the thoracic spine were obtained without and  with intravenous contrast.  Findings:  Abnormal thoracic spinal cord expansion from T4-T5 to T6- T7 appears to be the  result of a an oblong intradural and mostly or completely intramedullary mass with intrinsic T1  hyperintensity and signal suppression on both STIR and fat saturated postcontrast T1 images.  The mass is about 5 cm  in length and oval measuring up to 9 mm diameter.  There is evidence of mild cord edema immediately surrounding the lesion which extends to the mid T4 and mid T7 levels.  Following contrast there is little if any enhancement. The lesion might be slightly exophytic to the cord along the dorsal extent ( see series 15 image 19).  The epidural space at the level of the mass is within normal limits. No definite subarachnoid space abnormality.  There is no associated spinal dysraphism.  The other thoracic spinal cord levels are within normal limits. The conus was better visualized on the lumbar comparison but appears within normal limits.  No other areas of abnormal intradural enhancement.  Normal thoracic vertebral height, alignment, and marrow signal.Visualized paraspinal soft tissues are within normal limits.  Negative visualized thoracic and upper abdominal viscera.  IMPRESSION:  1.  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  Little if any enhancement.  Associated spinal cord expansion and mild cord edema.   No associated spinal dysraphism identified. 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  CT thoracic spine without contrast might be valuable to evaluate for any calcification within the mass (which would strongly indicate a dermoid). 2.  No other thoracic spine abnormality.  Study discussed with Dr. Thana Farr at [phone] hours on 11/08/2012.   Original Report Authenticated By: Erskine Speed, M.D.          Subjective: Patient feels some anterior chest wall discomfort with movement only. Denies any dizziness, shortness of breath, nausea, vomiting, diarrhea, abdominal pain, fevers, chills, coughing.  Objective: Filed Vitals:   11/14/12 0252 11/14/12 0620 11/14/12 1000 11/14/12 1400  BP: 157/91 158/96 130/80 119/69  Pulse: 61 53 60 68   Temp: 97.9 F (36.6 C) 98 F (36.7 C) 98 F (36.7 C) 98 F (36.7 C)  TempSrc: Oral Oral Oral Oral  Resp: 20 18 17 16   Height:      Weight:      SpO2: 100% 100% 100% 98%    Intake/Output Summary (Last 24 hours) at 11/14/12 1805 Last data filed at 11/13/12 2145  Gross per 24 hour  Intake      3 ml  Output   1600 ml  Net  -1597 ml   Weight change:  Exam:   General:  Pt is alert, follows commands appropriately, not in acute distress  HEENT: No icterus, No thrush,  [place]/AT  Cardiovascular: RRR, S1/S2, no rubs, no gallops  Respiratory: CTA bilaterally, no wheezing, no crackles, no rhonchi  Abdomen: Soft/+BS, non tender, non distended, no guarding  Extremities: No edema, No lymphangitis, No petechiae, No rashes, no synovitis  Data Reviewed: Basic Metabolic Panel:  Lab 11/14/12 9604 11/08/12 0609 11/07/12 2116  NA 138 139 142  K 4.1 4.0 3.4*  CL 103 104 104  CO2 26 25 25   GLUCOSE 115* 136* 98  BUN 18 14 13   CREATININE 0.64 0.71 0.73  CALCIUM 8.8 9.8 9.7  MG -- -- --  PHOS -- -- --   Liver Function Tests: No results found for this basename: AST:5,ALT:5,ALKPHOS:5,BILITOT:5,PROT:5,ALBUMIN:5 in the last 168 hours No results found for this  basename: LIPASE:5,AMYLASE:5 in the last 168 hours No results found for this basename: AMMONIA:5 in the last 168 hours CBC:  Lab 11/08/12 0609 11/07/12 2116  WBC 13.5* 11.9*  NEUTROABS -- 5.8  HGB 13.5 13.4  HCT 40.8 39.1  MCV 91.9 91.4  PLT 255 259   Cardiac Enzymes: No results found for this basename: CKTOTAL:5,CKMB:5,CKMBINDEX:5,TROPONINI:5 in the last 168 hours BNP: No components found with this basename: POCBNP:5 CBG:  Lab 11/14/12 1624 11/14/12 1131 11/14/12 0649 11/13/12 2219 11/13/12 1709  GLUCAP 140* 127* 101* 156* 138*    Recent Results (from the past 240 hour(s))  SURGICAL PCR SCREEN     Status: Normal   Collection Time   11/10/12  8:53 PM      Component Value Range Status Comment   MRSA, PCR NEGATIVE   NEGATIVE Final    Staphylococcus aureus NEGATIVE  NEGATIVE Final      Scheduled Meds:   . dexamethasone  4 mg Intravenous Q6H  . hydrochlorothiazide  12.5 mg Oral Daily  . insulin aspart  0-9 Units Subcutaneous TID WC  . ketorolac  30 mg Intravenous Q6H  . lactulose  10 g Oral TID  . lisinopril  20 mg Oral Daily  . nicotine  21 mg Transdermal Daily  . pantoprazole  40 mg Oral Daily  . polyethylene glycol  17 g Oral Daily  . senna  1 tablet Oral BID  . senna-docusate  2 tablet Oral QHS  . sodium chloride  3 mL Intravenous Q12H   Continuous Infusions:   . sodium chloride       Jashon Ishida, DO  [facility] Pager [phone]  If 7PM-7AM, please contact night-coverage [email or web address] Password [email or web address] 11/14/2012, 6:05 PM   LOS: 7 days

## 2012-11-15 LAB — HEMOGLOBIN A1C: Mean Plasma Glucose: 117 mg/dL — ABNORMAL HIGH (ref <117)

## 2012-11-15 LAB — BASIC METABOLIC PANEL
CO2: 27 mEq/L (ref 19–32)
Chloride: 102 mEq/L (ref 96–112)
Creatinine, Ser: 0.65 mg/dL (ref 0.50–1.10)
GFR calc Af Amer: >90 mL/min (ref >90)
Potassium: 4.1 mEq/L (ref 3.5–5.1)
Sodium: 137 mEq/L (ref 135–145)

## 2012-11-15 LAB — GLUCOSE, CAPILLARY
Glucose-Capillary: 114 mg/dL — ABNORMAL HIGH (ref 70–99)
Glucose-Capillary: 147 mg/dL — ABNORMAL HIGH (ref 70–99)
Glucose-Capillary: 147 mg/dL — ABNORMAL HIGH (ref 70–99)

## 2012-11-15 LAB — MAGNESIUM: Magnesium: 2.4 mg/dL (ref 1.5–2.5)

## 2012-11-15 MED ORDER — SENNA 8.6 MG PO TABS
2.0000 | ORAL_TABLET | Freq: Every day | ORAL | Status: DC
  Administered 2012-11-16 – 2012-11-18 (×3): 17.2 mg via ORAL
  Filled 2012-11-15 (×3): qty 2

## 2012-11-15 MED ORDER — DOCUSATE SODIUM 100 MG PO CAPS
100.0000 mg | ORAL_CAPSULE | Freq: Two times a day (BID) | ORAL | Status: DC
  Administered 2012-11-16 – 2012-11-18 (×5): 100 mg via ORAL
  Filled 2012-11-15 (×4): qty 1

## 2012-11-15 NOTE — Progress Notes (Signed)
Will follow pt for CIR.  Await tx notes. [phone]

## 2012-11-15 NOTE — Progress Notes (Signed)
No new issues overnight. Patient's pain well controlled. No headache with mobilization.  Afebrile. Vital stable. Urine output good. Bowel movement today.  Motor examination stable. Weak hip-flexion and knee extension both grating at a 2/5. Wiggles toes on right. Intermittent weak voluntary movement on the left. Sensory exam with position sense touch and pressure sensation on the right. Pain sensation diminished on the right. Pain sensation diminished but more prominent on the left. No position sense on the left. Does have touch and pressure sensation on the left.  Her wound is clean and dry. He appears to be healing well. No evidence of CSF leakage.  Overall stable. No major changes in motor exam. Beginning to mobilize with therapy. Patient ready for rehabilitation whenever bed available.

## 2012-11-15 NOTE — Progress Notes (Signed)
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 Jordan Likes.  -Continue Decadron. neurosurgery to address when to stop it  -Continue PPI. pain control with Vicodin and Flexeril.  -Still has some weakness of the lower extremities with hyperreflexia  -likely inpatient rehabilitation on discharge.  -PT/OT--started 11/15/12 HTN (hypertension)  Blood pressure stable at present. Continue home meds  Hypokalemia  Resolved.  -magnesium 2.4 Constipation  -give mag citrate-->small BM -continue colace and senna tobacco use  Counseled on smoking cessation  -nicotine patch  DVT prophylaxis  Resume Lovenox post op  Code Status: full  Family Communication: none at bedside today  Disposition Plan: pending PT eval. 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  Worsening bowel and bladder incontinence.  Possible transverse myelitis.  Contrast: 15 ml MultiHance.  Comparison: Lumbar MRI 11/07/2012 and earlier.  MRI CERVICAL SPINE WITHOUT AND WITH CONTRAST  Technique:  Multiplanar and multiecho pulse sequences of the cervic al spine, to include the craniocervical junction and cervicothoraci c junction, were obtained according to standard protocol without and with intravenous contrast.  Findings:  Cervicomedullary junction is within normal limits. Normal cervical spinal cord.  See thoracic spinal cord abnormality below.  Normal cervical  vertebral height and alignment. No marrow edema or evidence of acute osseous abnormality.  Visualized paraspinal soft tissues are within normal limits.  Minor cervical degenerative changes.  No cervical disc herniation, spinal stenosis, or significant neural foraminal stenosis.  IMPRESSION: 1.  Negative cervical MRI. 2.  Abnormal thoracic MRI, see below.  MRI THORACIC SPINE WITHOUT AND WITH CONTRAST  Technique: Multiplanar and multiecho pulse sequences of the thoracic spine were obtained without and  with intravenous contrast.  Findings:  Abnormal thoracic spinal cord expansion from T4-T5 to T6- T7 appears to be the result of a an oblong intradural and mostly or completely intramedullary mass with intrinsic T1  hyperintensity and signal suppression on both STIR and fat saturated postcontrast T1 images.  The mass is about 5 cm in length and oval measuring up to 9 mm diameter.  There is evidence of mild cord edema immediately surrounding the lesion which extends to the mid T4 and mid T7 levels.  Following contrast there is little if any enhancement. The lesion might be slightly exophytic to the cord along the dorsal extent ( see series 15 image 19).  The epidural space at the level of the mass is within normal limits. No definite subarachnoid space abnormality.  There is no associated spinal dysraphism.  The other thoracic spinal cord levels are within normal limits. The conus was better visualized on the lumbar comparison but appears within normal limits.  No other areas of abnormal intradural enhancement.  Normal thoracic vertebral height, alignment, and marrow signal.Visualized paraspinal soft tissues are within normal limits.  Negative visualized thoracic and upper abdominal viscera.  IMPRESSION:  1.  Unusual T5-T6 intradural spinal cord mass  50 mm in length by 9 mm diameter  appears to be largely lipomatous  in nature and intramedullary with possible dorsal exophytic component.  Little if any enhancement.   Associated spinal cord expansion and mild cord edema.   No associated spinal dysraphism identified. 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  CT thoracic spine without contrast might be valuable to evaluate for any calcification within the mass (which would strongly indicate a dermoid). 2.  No other thoracic spine abnormality.  Study discussed with Dr. Thana Farr at [phone] hours on 11/08/2012.   Original Report Authenticated By: Erskine Speed, M.D.    Mr Lumbar Spine Wo Contrast  11/07/2012  *RADIOLOGY REPORT*  Clinical Data: Worsening bowel and bladder incontinence for the past week.  MRI LUMBAR SPINE WITHOUT CONTRAST  Technique:  Multiplanar and multiecho pulse sequences of the lumbar spine were obtained without intravenous contrast.  Comparison: 08/01/2012.  Findings: Last fully open disc space is labeled L5-S1.  Present examination incorporates from T10-11 disc space through the lower sacrum.  Conus L1 level.  No compression of the distal cord or conus.  No abnormal signal within the distal cord or conus.  Visualized paravertebral structures without gross abnormality. Evaluation slightly limited by respiratory motion.  No worrisome bony lesion.  T10-11:  Mild left-sided facet bony overgrowth.  T11-12:  Minimal left facet bony overgrowth.  T12-L1:  Negative.  L1-2:  Negative.  L2-3:  Very small left foraminal/lateral protrusion approaching but not compressing the exiting left L2 nerve root.  Minimal bulge. Very mild facet joint degenerative changes.  L3-4:  Prominent right-sided facet joint degenerative changes containing fluid undermining the right ligamenta flava.  Mild left- sided facet joint degenerative changes.  Bulge with lateral extension approaching but not compressing the exiting L3 nerve roots.  Dorsal epidural fat.  Mild spinal stenosis  L4-5:  Mild facet joint degenerative changes greater on the right. Mild ligamentum flavum hypertrophy.  Bulge.  Left lateral  disc osteophyte complex approaches but does not compress the exiting left L4 nerve root.  Very mild spinal stenosis.  L5-S1:  No significant spinal stenosis or foraminal narrowing.  IMPRESSION: No conus compression detected as cause of patient's symptoms.  Scattered degenerative changes most notable at the L3-4 level as detailed above. Findings without significant change.   Original Report Authenticated By: Lacy Duverney, M.D.    Mr Thoracic Spine W Wo Contrast  11/08/2012  *RADIOLOGY REPORT*  Clinical Data:  36 year old female with back pain, bilateral lower extremity numbness and weakness.  Worsening bowel and bladder incontinence.  Possible transverse myelitis.  Contrast: 15 ml MultiHance.  Comparison: Lumbar MRI 11/07/2012 and earlier.  MRI CERVICAL SPINE WITHOUT AND WITH CONTRAST  Technique:  Multiplanar and multiecho pulse sequences of the cervic al spine, to include the craniocervical junction and cervicothoraci c junction, were obtained according to standard protocol without and with intravenous contrast.  Findings:  Cervicomedullary junction is within normal limits. Normal cervical spinal cord.  See thoracic spinal cord abnormality below.  Normal cervical vertebral height and alignment. No marrow edema or evidence of acute osseous abnormality.  Visualized paraspinal soft tissues are within normal limits.  Minor cervical degenerative changes.  No cervical disc herniation, spinal stenosis, or significant neural foraminal stenosis.  IMPRESSION: 1.  Negative cervical MRI. 2.  Abnormal thoracic MRI, see below.  MRI THORACIC SPINE WITHOUT AND WITH CONTRAST  Technique: Multiplanar and multiecho pulse sequences of the thoracic spine were obtained without and  with intravenous contrast.  Findings:  Abnormal thoracic spinal cord expansion from T4-T5 to T6- T7 appears to  be the result of a an oblong intradural and mostly or completely intramedullary mass with intrinsic T1  hyperintensity and signal suppression on  both STIR and fat saturated postcontrast T1 images.  The mass is about 5 cm in length and oval measuring up to 9 mm diameter.  There is evidence of mild cord edema immediately surrounding the lesion which extends to the mid T4 and mid T7 levels.  Following contrast there is little if any enhancement. The lesion might be slightly exophytic to the cord along the dorsal extent ( see series 15 image 19).  The epidural space at the level of the mass is within normal limits. No definite subarachnoid space abnormality.  There is no associated spinal dysraphism.  The other thoracic spinal cord levels are within normal limits. The conus was better visualized on the lumbar comparison but appears within normal limits.  No other areas of abnormal intradural enhancement.  Normal thoracic vertebral height, alignment, and marrow signal.Visualized paraspinal soft tissues are within normal limits.  Negative visualized thoracic and upper abdominal viscera.  IMPRESSION:  1.  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  Little if any enhancement.  Associated spinal cord expansion and mild cord edema.   No associated spinal dysraphism identified. 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  CT thoracic spine without contrast might be valuable to evaluate for any calcification within the mass (which would strongly indicate a dermoid). 2.  No other thoracic spine abnormality.  Study discussed with Dr. Thana Farr at [phone] hours on 11/08/2012.   Original Report Authenticated By: Erskine Speed, M.D.          Subjective: Patient had small bowel movement this morning. Denies any headache, fevers, chills, chest pain, shortness breath, nausea, vomiting, abdominal pain, dysuria, rashes.  Objective: Filed Vitals:   11/15/12 0149 11/15/12 0710 11/15/12 1031 11/15/12  1432  BP: 149/85 172/89 136/85 138/93  Pulse: 64 56 67 65  Temp: 98 F (36.7 C) 97.9 F (36.6 C) 97.7 F (36.5 C) 97.9 F (36.6 C)  TempSrc: Oral Oral Oral Oral  Resp: 18 18 17 18   Height:      Weight:      SpO2: 100% 98% 97% 99%    Intake/Output Summary (Last 24 hours) at 11/15/12 1510 Last data filed at 11/15/12 0700  Gross per 24 hour  Intake      0 ml  Output   1400 ml  Net  -1400 ml   Weight change:  Exam:   General:  Pt is alert, follows commands appropriately, not in acute distress  HEENT: No icterus, No thrush,  [place]/AT  Cardiovascular: RRR, S1/S2, no rubs, no gallops  Respiratory: CTA bilaterally, no wheezing, no crackles, no rhonchi  Abdomen: Soft/+BS, non tender, non distended, no guarding  Extremities: No edema, No lymphangitis, No petechiae, No rashes, no synovitis  Data Reviewed: Basic Metabolic Panel:  Lab 11/15/12 9604 11/14/12 0445  NA 137 138  K 4.1 4.1  CL 102 103  CO2 27 26  GLUCOSE 118* 115*  BUN 22 18  CREATININE 0.65 0.64  CALCIUM 9.0 8.8  MG 2.4 --  PHOS -- --   Liver Function Tests: No results found for this basename: AST:5,ALT:5,ALKPHOS:5,BILITOT:5,PROT:5,ALBUMIN:5 in the last 168 hours No results found for this basename: LIPASE:5,AMYLASE:5 in the last 168 hours No results found for this  basename: AMMONIA:5 in the last 168 hours CBC: No results found for this basename: WBC:5,NEUTROABS:5,HGB:5,HCT:5,MCV:5,PLT:5 in the last 168 hours Cardiac Enzymes: No results found for this basename: CKTOTAL:5,CKMB:5,CKMBINDEX:5,TROPONINI:5 in the last 168 hours BNP: No components found with this basename: POCBNP:5 CBG:  Lab 11/15/12 1120 11/15/12 0708 11/14/12 2254 11/14/12 1624 11/14/12 1131  GLUCAP 151* 114* 131* 140* 127*    Recent Results (from the past 240 hour(s))  SURGICAL PCR SCREEN     Status: Normal   Collection Time   11/10/12  8:53 PM      Component Value Range Status Comment   MRSA, PCR NEGATIVE  NEGATIVE Final     Staphylococcus aureus NEGATIVE  NEGATIVE Final      Scheduled Meds:   . dexamethasone  4 mg Intravenous Q6H  . hydrochlorothiazide  12.5 mg Oral Daily  . insulin aspart  0-9 Units Subcutaneous TID WC  . ketorolac  30 mg Intravenous Q6H  . lisinopril  20 mg Oral Daily  . nicotine  21 mg Transdermal Daily  . pantoprazole  40 mg Oral Daily  . polyethylene glycol  17 g Oral Daily  . senna  1 tablet Oral BID  . senna-docusate  2 tablet Oral QHS  . sodium chloride  3 mL Intravenous Q12H   Continuous Infusions:   . sodium chloride       Priyah Schmuck, DO  [facility] Pager [phone]  If 7PM-7AM, please contact night-coverage [email or web address] Password [email or web address] 11/15/2012, 3:10 PM   LOS: 8 days

## 2012-11-15 NOTE — Progress Notes (Signed)
Physical Therapy Treatment Patient Details Name: Debbie [name] MRN: [id number] DOB: 09-Feb-1976 Today's Date: 11/15/2012 Time: 9528-4132 PT Time Calculation (min): 41 min  PT Assessment / Plan / Recommendation Comments on Treatment Session       Follow Up Recommendations  CIR;Supervision/Assistance - 24 hour     Does the patient have the potential to tolerate intense rehabilitation     Barriers to Discharge        Equipment Recommendations  Wheelchair (measurements PT)    Recommendations for Other Services Rehab consult  Frequency     Plan      Precautions / Restrictions Precautions Precautions: Fall Restrictions Weight Bearing Restrictions: No   Pertinent Vitals/Pain No pain noted.     Mobility  Bed Mobility Bed Mobility: Supine to Sit;Sitting - Scoot to [place] of Bed;Sit to Supine;Rolling Right;Rolling Left Rolling Right: 4: Min assist Rolling Left: 3: Mod assist Supine to Sit: 3: Mod assist;With rails Sitting - Scoot to Edge of Bed: 3: Mod assist Sit to Supine: 3: Mod assist Details for Bed Mobility Assistance: VC for proper sequencing. Assist through LEs and support at pelvis for trunk. Completed x 2. Unable to bring hip over without assistance while rolling to the left secondary to no hip flexion actively.  Transfers Transfers: Risk manager: 1: +2 Total assist;To level surface Anterior-Posterior Transfers: Patient Percentage: 60% Details for Transfer Assistance: VC for proper sequencing and hand placement throughout. Assist with support of LEs and trunk control during transfer.  Ambulation/Gait Ambulation/Gait Assistance: Not tested (comment)    Exercises General Exercises - Lower Extremity Ankle Circles/Pumps: PROM;Both;10 reps;Supine (significant other educated ) Heel Slides: PROM;Both;10 reps;Supine (significant other educated) Straight Leg Raises: PROM;Both;10 reps;Supine (significant other educated)   PT  Diagnosis:    PT Problem List:   PT Treatment Interventions:     PT Goals Acute Rehab PT Goals PT Goal Formulation: With patient Time For Goal Achievement: 11/22/12 Potential to Achieve Goals: Good Pt will go Supine/Side to Sit: with supervision PT Goal: Supine/Side to Sit - Progress: Goal set today Pt will go Sit to Supine/Side: with supervision PT Goal: Sit to Supine/Side - Progress: Goal set today Pt will Transfer Bed to Chair/Chair to Bed: with supervision PT Transfer Goal: Bed to Chair/Chair to Bed - Progress: Goal set today Pt will Perform Home Exercise Program: Other (comment) (Family will be independent with PROM) PT Goal: Perform Home Exercise Program - Progress: Goal set today Pt will Propel Wheelchair: > 150 feet;with modified independence PT Goal: Propel Wheelchair - Progress: Goal set today  Visit Information  Last PT Received On: 11/15/12 Assistance Needed: +2 PT/OT Co-Evaluation/Treatment: Yes    Subjective Data  Patient Stated Goal: to be able to move again   Cognition  Overall Cognitive Status: Appears within functional limits for tasks assessed/performed Arousal/Alertness: Awake/alert Orientation Level: Appears intact for tasks assessed Behavior During Session: [facility] for tasks performed    Balance  Balance Balance Assessed: Yes Static Sitting Balance Static Sitting - Balance Support: Bilateral upper extremity supported Static Sitting - Level of Assistance: 4: Min assist Static Sitting - Comment/# of Minutes: Pt requires min assist for static sitting secondary to decreased lower trunk sensation  End of Session PT - End of Session Activity Tolerance: Patient tolerated treatment well Patient left: in chair;with call bell/phone within reach;with family/visitor present Nurse Communication: Mobility status   GP     Milana Kidney 11/15/2012, 10:16 AM

## 2012-11-15 NOTE — Progress Notes (Signed)
Will f/up w/ pt on Monday.  Will look at admitting her early next week, as bed available, &, she is medically stable. [phone]

## 2012-11-15 NOTE — Evaluation (Signed)
Occupational Therapy Evaluation Patient Details Name: Debbie [name] MRN: [id number] DOB: 12-17-1975 Today's Date: 11/15/2012 Time: 5621-3086 OT Time Calculation (min): 38 min  OT Assessment / Plan / Recommendation Clinical Impression  This 36 yo female s/p back surgery for removal of lipoma at T%-T7 level presents to acute OT like an incomplete para. Will benefit from acute OT wtih follow up OT on CIR.    OT Assessment  Patient needs continued OT Services    Follow Up Recommendations  CIR    Barriers to Discharge None    Equipment Recommendations  3 in 1 bedside comode;Tub/shower bench;Wheelchair cushion (measurements OT);Wheelchair (measurements OT)    Recommendations for Other Services Rehab consult  Frequency  Min 3X/week    Precautions / Restrictions Precautions Precautions: Fall Restrictions Weight Bearing Restrictions: No       ADL  Eating/Feeding: Simulated;Independent Where Assessed - Eating/Feeding: Edge of bed;Chair Grooming: Simulated;Set up Where Assessed - Grooming: Supported sitting Upper Body Bathing: Simulated;Set up Where Assessed - Upper Body Bathing: Supported sitting Lower Body Bathing: Simulated;Maximal assistance Where Assessed - Lower Body Bathing: Lean right and/or left;Supported sitting Upper Body Dressing: Simulated;Moderate assistance Where Assessed - Upper Body Dressing: Supported sitting Lower Body Dressing: Simulated;+1 Total assistance Where Assessed - Lower Body Dressing: Lean right and/or left;Supported sitting Toilet Transfer: Simulated;+2 Total assistance Toilet Transfer: Patient Percentage: 60% Toilet Transfer Method: Clinical biochemist:  Nurse, children's) Toileting - Clothing Manipulation and Hygiene: Performed;+1 Total assistance Where Assessed - Toileting Clothing Manipulation and Hygiene: Supine, head of bed flat;Rolling right and/or left Transfers/Ambulation Related to ADLs: Total A +2  (pt=60%)) for  anterior/posterior transfers    OT Diagnosis: Generalized weakness;Paresis  OT Problem List: Decreased range of motion;Impaired balance (sitting and/or standing);Impaired sensation OT Treatment Interventions: Self-care/ADL training;Therapeutic exercise;Therapeutic activities;Patient/family education;DME and/or AE instruction;Balance training   OT Goals Acute Rehab OT Goals OT Goal Formulation: With patient Time For Goal Achievement: 11/22/12 Potential to Achieve Goals: Good Miscellaneous OT Goals Miscellaneous OT Goal #1: Pt will be able to come up to sit EOB with S (with AE prn) in prep for transfers and to work on sitting balance. OT Goal: Miscellaneous Goal #1 - Progress: Goal set today Miscellaneous OT Goal #2: Pt will be able to sit EOB for 30 seconds and keep one hand off of bed while keeping balance to work on trunk control for BADLs OT Goal: Miscellaneous Goal #2 - Progress: Goal set today Miscellaneous OT Goal #3: Pt will be Independent with Bil UE level 2 or 3 theraband exercises to increase and maintain strength in BIL UEs to A her with transfers and BADLs. OT Goal: Miscellaneous Goal #3 - Progress: Goal set today  Visit Information  Last OT Received On: 11/15/12 Assistance Needed: +2 PT/OT Co-Evaluation/Treatment: Yes    Subjective Data  Subjective: I have been wanting to get up to the chair Patient Stated Goal: To go to rehab            Extremity/Trunk Assessment Right Upper Extremity Assessment RUE ROM/Strength/Tone: Within functional levels Left Upper Extremity Assessment LUE ROM/Strength/Tone: Within functional levels     Mobility Transfers Details for Transfer Assistance: We did an anterior/posterior transfer with patient, total A +2 (pt=60%) with VCs for where to put hands to A herself           Balance Static Sitting Balance Static Sitting - Comment/# of Minutes: Pt with decreased trunk control due to lipoma removal T5-T7, presents like an incomplete  para   End  of Session OT - End of Session Activity Tolerance: Patient tolerated treatment well Patient left: in chair;with call bell/phone within reach;with family/visitor present Nurse Communication: Mobility status (anterior/posterior transfer for bed to chair and back)       Evette Georges [phone] 11/15/2012, 1:17 PM

## 2012-11-16 LAB — CBC
HCT: 37.7 % (ref 36.0–46.0)
Hemoglobin: 12.8 g/dL (ref 12.0–15.0)
MCHC: 34 g/dL (ref 30.0–36.0)
MCV: 89.8 fL (ref 78.0–100.0)

## 2012-11-16 LAB — GLUCOSE, CAPILLARY
Glucose-Capillary: 115 mg/dL — ABNORMAL HIGH (ref 70–99)
Glucose-Capillary: 121 mg/dL — ABNORMAL HIGH (ref 70–99)

## 2012-11-16 MED ORDER — ENOXAPARIN SODIUM 40 MG/0.4ML ~~LOC~~ SOLN
40.0000 mg | Freq: Every day | SUBCUTANEOUS | Status: DC
  Administered 2012-11-16 – 2012-11-18 (×3): 40 mg via SUBCUTANEOUS
  Filled 2012-11-16 (×3): qty 0.4

## 2012-11-16 MED ORDER — LIDOCAINE 5 % EX PTCH
1.0000 | MEDICATED_PATCH | CUTANEOUS | Status: DC
  Administered 2012-11-16 – 2012-11-17 (×2): 1 via TRANSDERMAL
  Filled 2012-11-16 (×3): qty 1

## 2012-11-16 NOTE — Progress Notes (Addendum)
Subjective: Patient reports good spirits; no change in motor and sensory function.  Objective: Vital signs in last 24 hours: Temp:  [97.8 F (36.6 C)-98.1 F (36.7 C)] 97.8 F (36.6 C) (12/14 0932) Pulse Rate:  [61-81] 66  (12/14 0932) Resp:  [17-20] 18  (12/14 0932) BP: (127-167)/(70-105) 127/70 mmHg (12/14 0932) SpO2:  [95 %-100 %] 99 % (12/14 0932)  Intake/Output from previous day: 12/13 0701 - 12/14 0700 In: 240 [P.O.:240] Out: 600 [Urine:600] Intake/Output this shift: Total I/O In: 240 [P.O.:240] Out: -   Physical Exam: Volitional movement of right greater than left leg with T10 sensory level.  More preservation of sensation in right compared to left leg.  Incision CDI.  Lab Results:  [facility] 11/16/12 0625  WBC 23.9*  HGB 12.8  HCT 37.7  PLT 263   BMET  Basename 11/15/12 0615 11/14/12 0445  NA 137 138  K 4.1 4.1  CL 102 103  CO2 27 26  GLUCOSE 118* 115*  BUN 22 18  CREATININE 0.65 0.64  CALCIUM 9.0 8.8    Studies/Results: No results found.  Assessment/Plan: Continue to mobilize and pursue transfer to Rehab with aggressive PT.    LOS: 9 days    Adriana Lina D, MD 11/16/2012, 11:51 AM   Will start lovenox 40mg  sq QD for DVT prophylaxis in addition to SCDs.

## 2012-11-16 NOTE — Progress Notes (Signed)
Physical Therapy Treatment Patient Details Name: Debbie [name] MRN: [id number] DOB: [birth year] Today's Date: 11/16/2012 Time: 4098-1191 PT Time Calculation (min): 29 min  PT Assessment / Plan / Recommendation Comments on Treatment Session  Pt highly motivated for further improvements, able to complete transfer with only 1 person today. Pt able to proper WC through the hallways with only supervision assist. discussed A-P transfer to/from bedside commode. Will continue treatment prior to d/c to rehab    Follow Up Recommendations  CIR;Supervision/Assistance - 24 hour     Does the patient have the potential to tolerate intense rehabilitation     Barriers to Discharge        Equipment Recommendations  Wheelchair (measurements PT)    Recommendations for Other Services Rehab consult  Frequency Min 4X/week   Plan Discharge plan remains appropriate;Frequency remains appropriate    Precautions / Restrictions Restrictions Weight Bearing Restrictions: No   Pertinent Vitals/Pain No complaints of pain.    Mobility  Bed Mobility Bed Mobility: Supine to Sit;Sitting - Scoot to Edge of Bed Supine to Sit: 4: Min assist Sitting - Scoot to Edge of Bed: 3: Mod assist Details for Bed Mobility Assistance: Assist with LEs throughout transfer. Pt able to control trunk into sitting Transfers Transfers: Counselling psychologist Transfer: 3: Mod assist Details for Transfer Assistance: Assist through drawsheet. Cues for sequencing. Pt able to complete beginning 30% by herself but required increased assistance with the rest.  Ambulation/Gait Ambulation/Gait Assistance: Not tested (comment) Wheelchair Mobility Wheelchair Mobility: Yes Wheelchair Assistance: 5: Supervision Wheelchair Assistance Details (indicate cue type and reason): Pt able to propel with no assistance, fatigued after~400 ft. Cueing for sequencing with turns only. Pt educated on proper way to take leg rests  on/off and was able to complete with mod assist.  Occupational hygienist: Both upper extremities Wheelchair Parts Management: Needs assistance Distance: 400    Exercises     PT Diagnosis:    PT Problem List:   PT Treatment Interventions:     PT Goals Acute Rehab PT Goals PT Goal: Supine/Side to Sit - Progress: Progressing toward goal PT Goal: Sit to Supine/Side - Progress: Progressing toward goal PT Transfer Goal: Bed to Chair/Chair to Bed - Progress: Progressing toward goal PT Goal: Propel Wheelchair - Progress: Progressing toward goal  Visit Information  Last PT Received On: 11/16/12 Assistance Needed: +1    Subjective Data      Cognition  Overall Cognitive Status: Appears within functional limits for tasks assessed/performed Arousal/Alertness: Awake/alert Orientation Level: Appears intact for tasks assessed Behavior During Session: [facility] for tasks performed    Balance     End of Session PT - End of Session Activity Tolerance: Patient tolerated treatment well Patient left: in chair;with call bell/phone within reach Nurse Communication: Mobility status   GP     Milana Kidney 11/16/2012, 4:28 PM

## 2012-11-16 NOTE — Progress Notes (Signed)
[facility] PROGRESS NOTE  Debbie [name] ZOX:[id number] DOB: Jan 25, 1976 DOA: 11/07/2012 PCP: MUSE,ROCHELLE D., PA  Assessment/Plan: Lower extremity weakness  -Continue Decadron. 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 Jordan Likes.  -Continue PPI. pain control with Vicodin and Flexeril.  -Still has some weakness of the lower extremities with hyperreflexia  -likely inpatient rehabilitation on discharge.  -PT/OT--started 11/15/12  HTN (hypertension)  Blood pressure stable at present. Continue home meds  Hypokalemia  Resolved.  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  Worsening bowel and bladder incontinence.  Possible transverse myelitis.  Contrast: 15 ml MultiHance.  Comparison: Lumbar MRI 11/07/2012 and earlier.  MRI CERVICAL SPINE WITHOUT AND WITH CONTRAST  Technique:  Multiplanar and multiecho pulse sequences of the cervic al spine, to include the craniocervical junction and cervicothoraci c junction, were obtained according to standard protocol without and with intravenous contrast.  Findings:  Cervicomedullary junction is within normal limits. Normal cervical spinal cord.  See thoracic spinal cord  abnormality below.  Normal cervical vertebral height and alignment. No marrow edema or evidence of acute osseous abnormality.  Visualized paraspinal soft tissues are within normal limits.  Minor cervical degenerative changes.  No cervical disc herniation, spinal stenosis, or significant neural foraminal stenosis.  IMPRESSION: 1.  Negative cervical MRI. 2.  Abnormal thoracic MRI, see below.  MRI THORACIC SPINE WITHOUT AND WITH CONTRAST  Technique: Multiplanar and multiecho pulse sequences of the thoracic spine were obtained without and  with intravenous contrast.  Findings:  Abnormal thoracic spinal cord expansion from T4-T5 to T6- T7 appears to be the result of a an oblong intradural and mostly or completely intramedullary mass with intrinsic T1  hyperintensity and signal suppression on both STIR and fat saturated postcontrast T1 images.  The mass is about 5 cm in length and oval measuring up to 9 mm diameter.  There is evidence of mild cord edema immediately surrounding the lesion which extends to the mid T4 and mid T7 levels.  Following contrast there is little if any enhancement. The lesion might be slightly exophytic to the cord along the dorsal extent ( see series 15 image 19).  The epidural space at the level of the mass is within normal limits. No definite subarachnoid space abnormality.  There is no associated spinal dysraphism.  The other thoracic spinal cord levels are within normal limits. The conus was better visualized on the lumbar comparison but appears within normal limits.  No other areas of abnormal intradural enhancement.  Normal thoracic vertebral height, alignment, and marrow signal.Visualized paraspinal soft tissues are within normal limits.  Negative visualized thoracic and upper abdominal viscera.  IMPRESSION:  1.  Unusual T5-T6 intradural spinal cord mass  50 mm in length by 9 mm  diameter  appears to be largely lipomatous in nature and intramedullary with possible dorsal exophytic component.   Little if any enhancement.  Associated spinal cord expansion and mild cord edema.   No associated spinal dysraphism identified. 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  CT thoracic spine without contrast might be valuable to evaluate for any calcification within the mass (which would strongly indicate a dermoid). 2.  No other thoracic spine abnormality.  Study discussed with Dr. Thana Farr at [phone] hours on 11/08/2012.   Original Report Authenticated By: Erskine Speed, M.D.    Mr Lumbar Spine Wo Contrast  11/07/2012  *RADIOLOGY REPORT*  Clinical Data: Worsening bowel and bladder incontinence for the past week.  MRI LUMBAR SPINE WITHOUT CONTRAST  Technique:  Multiplanar and multiecho pulse sequences of the lumbar spine were obtained without intravenous contrast.  Comparison: 08/01/2012.  Findings: Last fully open disc space is labeled L5-S1.  Present examination incorporates from T10-11 disc space through the lower sacrum.  Conus L1 level.  No compression of the distal cord or conus.  No abnormal signal within the distal cord or conus.  Visualized paravertebral structures without gross abnormality. Evaluation slightly limited by respiratory motion.  No worrisome bony lesion.  T10-11:  Mild left-sided facet bony overgrowth.  T11-12:  Minimal left facet bony overgrowth.  T12-L1:  Negative.  L1-2:  Negative.  L2-3:  Very small left foraminal/lateral protrusion approaching but not compressing the exiting left L2 nerve root.  Minimal bulge. Very mild facet joint degenerative changes.  L3-4:  Prominent right-sided facet joint degenerative changes containing fluid undermining the right ligamenta flava.  Mild left- sided facet joint degenerative changes.  Bulge with lateral extension approaching but not compressing the exiting L3 nerve roots.  Dorsal epidural fat.  Mild spinal stenosis  L4-5:  Mild facet joint degenerative changes greater on the right. Mild ligamentum flavum  hypertrophy.  Bulge.  Left lateral disc osteophyte complex approaches but does not compress the exiting left L4 nerve root.  Very mild spinal stenosis.  L5-S1:  No significant spinal stenosis or foraminal narrowing.  IMPRESSION: No conus compression detected as cause of patient's symptoms.  Scattered degenerative changes most notable at the L3-4 level as detailed above. Findings without significant change.   Original Report Authenticated By: Lacy Duverney, M.D.    Mr Thoracic Spine W Wo Contrast  11/08/2012  *RADIOLOGY REPORT*  Clinical Data:  36 year old female with back pain, bilateral lower extremity numbness and weakness.  Worsening bowel and bladder incontinence.  Possible transverse myelitis.  Contrast: 15 ml MultiHance.  Comparison: Lumbar MRI 11/07/2012 and earlier.  MRI CERVICAL SPINE WITHOUT AND WITH CONTRAST  Technique:  Multiplanar and multiecho pulse sequences of the cervic al spine, to include the craniocervical junction and cervicothoraci c junction, were obtained according to standard protocol without and with intravenous contrast.  Findings:  Cervicomedullary junction is within normal limits. Normal cervical spinal cord.  See thoracic spinal cord abnormality below.  Normal cervical vertebral height and alignment. No marrow edema or evidence of acute osseous abnormality.  Visualized paraspinal soft tissues are within normal limits.  Minor cervical degenerative changes.  No cervical disc herniation, spinal stenosis, or significant neural foraminal stenosis.  IMPRESSION: 1.  Negative cervical MRI. 2.  Abnormal thoracic MRI, see below.  MRI THORACIC SPINE WITHOUT AND WITH CONTRAST  Technique: Multiplanar and multiecho pulse sequences of the thoracic spine were obtained without and  with intravenous contrast.  Findings:  Abnormal thoracic spinal cord expansion  from T4-T5 to T6- T7 appears to be the result of a an oblong intradural and mostly or completely intramedullary mass with intrinsic T1   hyperintensity and signal suppression on both STIR and fat saturated postcontrast T1 images.  The mass is about 5 cm in length and oval measuring up to 9 mm diameter.  There is evidence of mild cord edema immediately surrounding the lesion which extends to the mid T4 and mid T7 levels.  Following contrast there is little if any enhancement. The lesion might be slightly exophytic to the cord along the dorsal extent ( see series 15 image 19).  The epidural space at the level of the mass is within normal limits. No definite subarachnoid space abnormality.  There is no associated spinal dysraphism.  The other thoracic spinal cord levels are within normal limits. The conus was better visualized on the lumbar comparison but appears within normal limits.  No other areas of abnormal intradural enhancement.  Normal thoracic vertebral height, alignment, and marrow signal.Visualized paraspinal soft tissues are within normal limits.  Negative visualized thoracic and upper abdominal viscera.  IMPRESSION:  1.  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  Little if any enhancement.  Associated spinal cord expansion and mild cord edema.   No associated spinal dysraphism identified. 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  CT thoracic spine without contrast might be valuable to evaluate for any calcification within the mass (which would strongly indicate a dermoid). 2.  No other thoracic spine abnormality.  Study discussed with Dr. Thana Farr at [phone] hours on 11/08/2012.   Original Report Authenticated By: Erskine Speed, M.D.          Subjective: Patient complains of right trapezius area pain, particularly with certain movements of the upper extremities. Denies fevers, chills, chest pain, shortness breath, nausea, vomiting, diarrhea, abdominal pain,  dysuria. No bowel movement in the last 24 hours  Objective: Filed Vitals:   11/15/12 2215 11/16/12 0546 11/16/12 0932 11/16/12 1352  BP: 167/95 154/95 127/70 151/87  Pulse: 65 81 66 77  Temp: 98.1 F (36.7 C) 97.8 F (36.6 C) 97.8 F (36.6 C) 97.8 F (36.6 C)  TempSrc: Oral Oral Oral Oral  Resp: 18 20 18 18   Height:      Weight:      SpO2: 95% 98% 99% 100%    Intake/Output Summary (Last 24 hours) at 11/16/12 1537 Last data filed at 11/16/12 1300  Gross per 24 hour  Intake    480 ml  Output    800 ml  Net   -320 ml   Weight change:  Exam:   General:  Pt is alert, follows commands appropriately, not in acute distress  HEENT: No icterus, No thrush, [place]/AT  Cardiovascular: RRR, S1/S2, no rubs, no gallops  Respiratory: CTA bilaterally, no wheezing, no crackles, no rhonchi  Abdomen: Soft/+BS, non tender, non distended, no guarding  Extremities: No edema, No lymphangitis, No petechiae, No rashes, no synovitis; mild paraspinal muscular hypertonicity  Data Reviewed: Basic Metabolic Panel:  Lab 11/15/12 9604 11/14/12 0445  NA 137 138  K 4.1 4.1  CL 102 103  CO2 27 26  GLUCOSE 118* 115*  BUN 22 18  CREATININE 0.65 0.64  CALCIUM 9.0 8.8  MG 2.4 --  PHOS -- --   Liver Function Tests: No results found for this  basename: AST:5,ALT:5,ALKPHOS:5,BILITOT:5,PROT:5,ALBUMIN:5 in the last 168 hours No results found for this basename: LIPASE:5,AMYLASE:5 in the last 168 hours No results found for this basename: AMMONIA:5 in the last 168 hours CBC:  Lab 11/16/12 0625  WBC 23.9*  NEUTROABS --  HGB 12.8  HCT 37.7  MCV 89.8  PLT 263   Cardiac Enzymes: No results found for this basename: CKTOTAL:5,CKMB:5,CKMBINDEX:5,TROPONINI:5 in the last 168 hours BNP: No components found with this basename: POCBNP:5 CBG:  Lab 11/16/12 1152 11/16/12 0719 11/15/12 2210 11/15/12 1619 11/15/12 1120  GLUCAP 111* 115* 147* 147* 151*    Recent Results (from the past 240 hour(s))   SURGICAL PCR SCREEN     Status: Normal   Collection Time   11/10/12  8:53 PM      Component Value Range Status Comment   MRSA, PCR NEGATIVE  NEGATIVE Final    Staphylococcus aureus NEGATIVE  NEGATIVE Final      Scheduled Meds:   . dexamethasone  4 mg Intravenous Q6H  . docusate sodium  100 mg Oral BID  . enoxaparin (LOVENOX) injection  40 mg Subcutaneous Daily  . hydrochlorothiazide  12.5 mg Oral Daily  . insulin aspart  0-9 Units Subcutaneous TID WC  . ketorolac  30 mg Intravenous Q6H  . lidocaine  1 patch Transdermal Q24H  . lisinopril  20 mg Oral Daily  . nicotine  21 mg Transdermal Daily  . pantoprazole  40 mg Oral Daily  . senna  2 tablet Oral Daily  . sodium chloride  3 mL Intravenous Q12H   Continuous Infusions:   . sodium chloride       Makaylynn Bonillas, DO  [facility] Pager [phone]  If 7PM-7AM, please contact night-coverage [email or web address] Password [email or web address] 11/16/2012, 3:37 PM   LOS: 9 days

## 2012-11-17 LAB — GLUCOSE, CAPILLARY
Glucose-Capillary: 107 mg/dL — ABNORMAL HIGH (ref 70–99)
Glucose-Capillary: 107 mg/dL — ABNORMAL HIGH (ref 70–99)

## 2012-11-17 MED ORDER — LISINOPRIL 40 MG PO TABS
40.0000 mg | ORAL_TABLET | Freq: Every day | ORAL | Status: DC
  Administered 2012-11-18: 40 mg via ORAL
  Filled 2012-11-17: qty 1

## 2012-11-17 MED ORDER — LISINOPRIL 20 MG PO TABS
20.0000 mg | ORAL_TABLET | Freq: Once | ORAL | Status: AC
  Administered 2012-11-17: 20 mg via ORAL
  Filled 2012-11-17 (×2): qty 1

## 2012-11-17 NOTE — Progress Notes (Signed)
Subjective: Patient reports improving leg function and sensation.  Objective: Vital signs in last 24 hours: Temp:  [97.6 F (36.4 C)-98.1 F (36.7 C)] 98 F (36.7 C) (12/15 0600) Pulse Rate:  [61-77] 66  (12/15 0600) Resp:  [14-18] 14  (12/15 0600) BP: (127-170)/(70-89) 159/87 mmHg (12/15 0600) SpO2:  [93 %-100 %] 99 % (12/15 0600)  Intake/Output from previous day: 12/14 0701 - 12/15 0700 In: 720 [P.O.:720] Out: 2800 [Urine:2800] Intake/Output this shift:    Physical Exam: Incision CDI.  Flat.  Getting internal and external rip rotation on right and more foot movement.  Improving sensation on left and an occasional flicker of toe movement.    Lab Results:  [facility] 11/16/12 0625  WBC 23.9*  HGB 12.8  HCT 37.7  PLT 263   BMET  Basename 11/15/12 0615  NA 137  K 4.1  CL 102  CO2 27  GLUCOSE 118*  BUN 22  CREATININE 0.65  CALCIUM 9.0    Studies/Results: No results found.  Assessment/Plan: Ready for Rehab in am.  OK to shower today.  OK to taper decadron slowly.    LOS: 10 days    Dorian Heckle, MD 11/17/2012, 8:06 AM

## 2012-11-17 NOTE — Progress Notes (Signed)
[facility] PROGRESS NOTE  Debbie [name] HQI:[id number] DOB: [birth year] DOA: 11/07/2012 PCP: MUSE,ROCHELLE D., PA  Assessment/Plan: Lower extremity weakness  -Continue Decadron. 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 Jordan Likes.  -Continue PPI. pain control with Vicodin and Flexeril.  -Still has some weakness of the lower extremities with hyperreflexia  -likely inpatient rehabilitation on discharge.  -PT/OT--started 11/15/12  HTN (hypertension)  Blood pressure stable at present. Continue home meds  -Increase lisinopril to 40 mg daily -Recheck BMP in the morning Hypokalemia  Resolved.  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  Worsening bowel and bladder incontinence.  Possible transverse myelitis.  Contrast: 15 ml MultiHance.  Comparison: Lumbar MRI 11/07/2012 and earlier.  MRI CERVICAL SPINE WITHOUT AND WITH CONTRAST  Technique:  Multiplanar and multiecho pulse sequences of the cervic al spine, to include the craniocervical junction and cervicothoraci c junction, were obtained according to standard protocol without and with intravenous contrast.  Findings:  Cervicomedullary junction is within normal  limits. Normal cervical spinal cord.  See thoracic spinal cord abnormality below.  Normal cervical vertebral height and alignment. No marrow edema or evidence of acute osseous abnormality.  Visualized paraspinal soft tissues are within normal limits.  Minor cervical degenerative changes.  No cervical disc herniation, spinal stenosis, or significant neural foraminal stenosis.  IMPRESSION: 1.  Negative cervical MRI. 2.  Abnormal thoracic MRI, see below.  MRI THORACIC SPINE WITHOUT AND WITH CONTRAST  Technique: Multiplanar and multiecho pulse sequences of the thoracic spine were obtained without and  with intravenous contrast.  Findings:  Abnormal thoracic spinal cord expansion from T4-T5 to T6- T7 appears to be the result of a an oblong intradural and mostly or completely intramedullary mass with intrinsic T1  hyperintensity and signal suppression on both STIR and fat saturated postcontrast T1 images.  The mass is about 5 cm in length and oval measuring up to 9 mm diameter.  There is evidence of mild cord edema immediately surrounding the lesion which extends to the mid T4 and mid T7 levels.  Following contrast there is little if any enhancement. The lesion might be slightly exophytic to the cord along the dorsal extent ( see series 15 image 19).  The epidural space at the level of the mass is within normal limits. No definite subarachnoid space abnormality.  There is no associated spinal dysraphism.  The other thoracic spinal cord levels are within normal limits. The conus was better visualized on the lumbar comparison but appears within normal limits.  No other areas of abnormal intradural enhancement.  Normal thoracic vertebral height, alignment, and marrow signal.Visualized paraspinal soft tissues are within normal limits.  Negative visualized thoracic and upper abdominal viscera.  IMPRESSION:  1.  Unusual T5-T6 intradural spinal cord mass  50 mm in length by 9 mm diameter  appears to be largely lipomatous in  nature and intramedullary with possible dorsal exophytic component.  Little if any enhancement.  Associated spinal cord expansion and mild cord edema.   No associated spinal dysraphism identified. 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  CT thoracic spine without contrast might be valuable to evaluate for any calcification within the mass (which would strongly indicate a dermoid). 2.  No other thoracic spine abnormality.  Study discussed with Dr. Thana Farr at [phone] hours on 11/08/2012.   Original Report Authenticated By: Erskine Speed, M.D.    Mr Lumbar Spine Wo Contrast  11/07/2012  *RADIOLOGY REPORT*  Clinical Data: Worsening bowel and bladder incontinence for the past week.  MRI LUMBAR SPINE WITHOUT CONTRAST  Technique:  Multiplanar and multiecho pulse sequences of the lumbar spine were obtained without intravenous contrast.  Comparison: 08/01/2012.  Findings: Last fully open disc space is labeled L5-S1.  Present examination incorporates from T10-11 disc space through the lower sacrum.  Conus L1 level.  No compression of the distal cord or conus.  No abnormal signal within the distal cord or conus.  Visualized paravertebral structures without gross abnormality. Evaluation slightly limited by respiratory motion.  No worrisome bony lesion.  T10-11:  Mild left-sided facet bony overgrowth.  T11-12:  Minimal left facet bony overgrowth.  T12-L1:  Negative.  L1-2:  Negative.  L2-3:  Very small left foraminal/lateral protrusion approaching but not compressing the exiting left L2 nerve root.  Minimal bulge. Very mild facet joint degenerative changes.  L3-4:  Prominent right-sided facet joint degenerative changes containing fluid undermining the right ligamenta flava.  Mild left- sided facet joint degenerative changes.  Bulge with lateral extension approaching but not compressing the exiting L3 nerve roots.  Dorsal epidural fat.  Mild spinal stenosis  L4-5:  Mild facet joint  degenerative changes greater on the right. Mild ligamentum flavum hypertrophy.  Bulge.  Left lateral disc osteophyte complex approaches but does not compress the exiting left L4 nerve root.  Very mild spinal stenosis.  L5-S1:  No significant spinal stenosis or foraminal narrowing.  IMPRESSION: No conus compression detected as cause of patient's symptoms.  Scattered degenerative changes most notable at the L3-4 level as detailed above. Findings without significant change.   Original Report Authenticated By: Lacy Duverney, M.D.    Mr Thoracic Spine W Wo Contrast  11/08/2012  *RADIOLOGY REPORT*  Clinical Data:  36 year old female with back pain, bilateral lower extremity numbness and weakness.  Worsening bowel and bladder incontinence.  Possible transverse myelitis.  Contrast: 15 ml MultiHance.  Comparison: Lumbar MRI 11/07/2012 and earlier.  MRI CERVICAL SPINE WITHOUT AND WITH CONTRAST  Technique:  Multiplanar and multiecho pulse sequences of the cervic al spine, to include the craniocervical junction and cervicothoraci c junction, were obtained according to standard protocol without and with intravenous contrast.  Findings:  Cervicomedullary junction is within normal limits. Normal cervical spinal cord.  See thoracic spinal cord abnormality below.  Normal cervical vertebral height and alignment. No marrow edema or evidence of acute osseous abnormality.  Visualized paraspinal soft tissues are within normal limits.  Minor cervical degenerative changes.  No cervical disc herniation, spinal stenosis, or significant neural foraminal stenosis.  IMPRESSION: 1.  Negative cervical MRI. 2.  Abnormal thoracic MRI, see below.  MRI THORACIC SPINE WITHOUT AND WITH CONTRAST  Technique: Multiplanar and multiecho pulse sequences of the thoracic spine were obtained without and  with intravenous contrast.  Findings:  Abnormal thoracic spinal cord expansion from T4-T5 to T6- T7 appears to be the result of a an oblong intradural and  mostly or completely intramedullary mass with intrinsic T1  hyperintensity and signal suppression on both STIR and fat saturated postcontrast T1 images.  The mass is about 5 cm in length and oval measuring up to 9 mm diameter.  There is evidence of mild cord edema immediately surrounding the lesion which extends to the mid T4 and mid T7 levels.  Following contrast there is little if any enhancement. The lesion might be slightly exophytic to the cord along the dorsal extent ( see series 15 image 19).  The epidural space at the level of the mass is within normal limits. No definite subarachnoid space abnormality.  There is no associated spinal dysraphism.  The other thoracic spinal cord levels are within normal limits. The conus was better visualized on the lumbar comparison but appears within normal limits.  No other areas of abnormal intradural enhancement.  Normal thoracic vertebral height, alignment, and marrow signal.Visualized paraspinal soft tissues are within normal limits.  Negative visualized thoracic and upper abdominal viscera.  IMPRESSION:  1.  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  Little if any enhancement.  Associated spinal cord expansion and mild cord edema.   No associated spinal dysraphism identified. 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  CT thoracic spine without contrast might be valuable to evaluate for any calcification within the mass (which would strongly indicate a dermoid). 2.  No other thoracic spine abnormality.  Study discussed with Dr. Thana Farr at [phone] hours on 11/08/2012.   Original Report Authenticated By: Erskine Speed, M.D.          Subjective: Patient states that the Lidoderm patch has helped her back pain. Denies fevers, chills, chest pain, shortness of breath, nausea, vomiting, diarrhea.  She occasionally feels lightheaded without any visual changes, focal extremity weakness, nausea, chest pain, shortness of breath.  Objective: Filed Vitals:   11/17/12 0130 11/17/12 0400 11/17/12 0600 11/17/12 1444  BP: 170/80  159/87 146/79  Pulse: 69  66 78  Temp: 97.6 F (36.4 C)  98 F (36.7 C) 97.9 F (36.6 C)  TempSrc: Oral  Oral   Resp: 14  14 20   Height:      Weight:      SpO2: 97% 98% 99% 100%    Intake/Output Summary (Last 24 hours) at 11/17/12 1536 Last data filed at 11/17/12 1400  Gross per 24 hour  Intake    790 ml  Output   3350 ml  Net  -2560 ml   Weight change:  Exam:   General:  Pt is alert, follows commands appropriately, not in acute distress  HEENT: No icterus, No thrush, [place]/AT  Cardiovascular: RRR, S1/S2, no rubs, no gallops  Respiratory: CTA bilaterally, no wheezing, no crackles, no rhonchi  Abdomen: Soft/+BS, non tender, non distended, no guarding  Extremities: No edema, No lymphangitis, No petechiae, No rashes, no synovitis  Data Reviewed: Basic Metabolic Panel:  Lab 11/15/12 5409 11/14/12 0445  NA 137 138  K 4.1 4.1  CL 102 103  CO2 27 26  GLUCOSE 118* 115*  BUN 22 18  CREATININE 0.65 0.64  CALCIUM 9.0 8.8  MG 2.4 --  PHOS -- --   Liver Function Tests: No results found for  this basename: AST:5,ALT:5,ALKPHOS:5,BILITOT:5,PROT:5,ALBUMIN:5 in the last 168 hours No results found for this basename: LIPASE:5,AMYLASE:5 in the last 168 hours No results found for this basename: AMMONIA:5 in the last 168 hours CBC:  Lab 11/16/12 0625  WBC 23.9*  NEUTROABS --  HGB 12.8  HCT 37.7  MCV 89.8  PLT 263   Cardiac Enzymes: No results found for this basename: CKTOTAL:5,CKMB:5,CKMBINDEX:5,TROPONINI:5 in the last 168 hours BNP: No components found with this basename: POCBNP:5 CBG:  Lab 11/17/12 1159 11/17/12 0850 11/16/12 2109 11/16/12 1649 11/16/12 1152  GLUCAP 177* 107* 121* 121* 111*    Recent Results (from the past 240 hour(s))   SURGICAL PCR SCREEN     Status: Normal   Collection Time   11/10/12  8:53 PM      Component Value Range Status Comment   MRSA, PCR NEGATIVE  NEGATIVE Final    Staphylococcus aureus NEGATIVE  NEGATIVE Final      Scheduled Meds:   . dexamethasone  4 mg Intravenous Q6H  . docusate sodium  100 mg Oral BID  . enoxaparin (LOVENOX) injection  40 mg Subcutaneous Daily  . hydrochlorothiazide  12.5 mg Oral Daily  . insulin aspart  0-9 Units Subcutaneous TID WC  . lidocaine  1 patch Transdermal Q24H  . lisinopril  20 mg Oral Once  . lisinopril  40 mg Oral Daily  . nicotine  21 mg Transdermal Daily  . pantoprazole  40 mg Oral Daily  . senna  2 tablet Oral Daily  . sodium chloride  3 mL Intravenous Q12H   Continuous Infusions:   . sodium chloride       Mehdi Gironda, DO  [facility] Pager [phone]  If 7PM-7AM, please contact night-coverage [email or web address] Password [email or web address] 11/17/2012, 3:36 PM   LOS: 10 days

## 2012-11-17 NOTE — Progress Notes (Signed)
Physical Therapy Treatment Patient Details Name: Debbie [name] MRN: [id number] DOB: 1976-01-09 Today's Date: 11/17/2012 Time: 1191-4782 PT Time Calculation (min): 31 min  PT Assessment / Plan / Recommendation Comments on Treatment Session  Pt moving well this session, able to complete bed mobility with supervision only. Pt also becoming more independent with WC part management. Pt able to move ankle on R side more today and have more control with LEs through UE.     Follow Up Recommendations  CIR;Supervision/Assistance - 24 hour     Does the patient have the potential to tolerate intense rehabilitation     Barriers to Discharge        Equipment Recommendations  Wheelchair (measurements PT);Other (comment) (elevating leg rests)    Recommendations for Other Services Rehab consult  Frequency Min 4X/week   Plan Discharge plan remains appropriate;Frequency remains appropriate    Precautions / Restrictions Precautions Precautions: Fall Restrictions Weight Bearing Restrictions: No   Pertinent Vitals/Pain No complaints of pain. No muscle spasms throughout session    Mobility  Bed Mobility Bed Mobility: Supine to Sit;Sitting - Scoot to Edge of Bed Rolling Right: 4: Min guard Rolling Left: 4: Min assist Supine to Sit: 5: Supervision Sitting - Scoot to Edge of Bed: 4: Min assist Details for Bed Mobility Assistance: Assist for scooting with drawsheet. No LE supoprt. Pt able to complete 75% of roll, requires min assist for end range into full sidelying to complete hygiene Transfers Transfers: Counselling psychologist Transfer: 4: Min assist Details for Transfer Assistance: Min assist with drawsheet for ~25% of transfer(from one surface to another). Cues for hand placement and support for safety Ambulation/Gait Ambulation/Gait Assistance: Not tested (comment) Wheelchair Mobility Wheelchair Mobility: Yes Wheelchair Assistance: 5: Supervision Wheelchair  Assistance Details (indicate cue type and reason): No cues needed for propulsion, assist with fatigue towards end of distance. Pt able to put 1 leg rest on although still required assistance for bottom of leg rests Wheelchair Propulsion: Both upper extremities Wheelchair Parts Management: Needs assistance Distance: 400    Exercises     PT Diagnosis:    PT Problem List:   PT Treatment Interventions:     PT Goals Acute Rehab PT Goals PT Goal: Supine/Side to Sit - Progress: Met PT Transfer Goal: Bed to Chair/Chair to Bed - Progress: Progressing toward goal PT Goal: Propel Wheelchair - Progress: Progressing toward goal  Visit Information  Last PT Received On: 11/17/12 Assistance Needed: +1    Subjective Data      Cognition  Overall Cognitive Status: Appears within functional limits for tasks assessed/performed Arousal/Alertness: Awake/alert Orientation Level: Appears intact for tasks assessed Behavior During Session: [facility] for tasks performed    Balance     End of Session PT - End of Session Activity Tolerance: Patient tolerated treatment well Patient left: in chair;with call bell/phone within reach Nurse Communication: Mobility status   GP     Debbie [name] 11/17/2012, 4:37 PM

## 2012-11-18 ENCOUNTER — Inpatient Hospital Stay (HOSPITAL_COMMUNITY)
Admission: RE | Admit: 2012-11-18 | Discharge: 2012-12-06 | DRG: 945 | Disposition: A | Discharge status: Home Health | Payer: Medicaid Other | Source: Ambulatory Visit | Attending: Physical Medicine & Rehabilitation | Admitting: Physical Medicine & Rehabilitation

## 2012-11-18 DIAGNOSIS — Z9181 History of falling: Secondary | ICD-10-CM

## 2012-11-18 DIAGNOSIS — R32 Unspecified urinary incontinence: Secondary | ICD-10-CM

## 2012-11-18 DIAGNOSIS — I10 Essential (primary) hypertension: Secondary | ICD-10-CM

## 2012-11-18 DIAGNOSIS — N319 Neuromuscular dysfunction of bladder, unspecified: Secondary | ICD-10-CM

## 2012-11-18 DIAGNOSIS — G8220 Paraplegia, unspecified: Secondary | ICD-10-CM

## 2012-11-18 DIAGNOSIS — C720 Malignant neoplasm of spinal cord: Secondary | ICD-10-CM

## 2012-11-18 DIAGNOSIS — N390 Urinary tract infection, site not specified: Secondary | ICD-10-CM

## 2012-11-18 DIAGNOSIS — Z9889 Other specified postprocedural states: Secondary | ICD-10-CM

## 2012-11-18 DIAGNOSIS — N3289 Other specified disorders of bladder: Secondary | ICD-10-CM

## 2012-11-18 DIAGNOSIS — Z79899 Other long term (current) drug therapy: Secondary | ICD-10-CM

## 2012-11-18 DIAGNOSIS — K592 Neurogenic bowel, not elsewhere classified: Secondary | ICD-10-CM

## 2012-11-18 DIAGNOSIS — D1779 Benign lipomatous neoplasm of other sites: Secondary | ICD-10-CM

## 2012-11-18 DIAGNOSIS — Z5189 Encounter for other specified aftercare: Principal | ICD-10-CM

## 2012-11-18 DIAGNOSIS — F17200 Nicotine dependence, unspecified, uncomplicated: Secondary | ICD-10-CM

## 2012-11-18 LAB — BASIC METABOLIC PANEL
Calcium: 9 mg/dL (ref 8.4–10.5)
Chloride: 99 mEq/L (ref 96–112)
Creatinine, Ser: 0.56 mg/dL (ref 0.50–1.10)
GFR calc Af Amer: >90 mL/min (ref >90)

## 2012-11-18 LAB — GLUCOSE, CAPILLARY: Glucose-Capillary: 108 mg/dL — ABNORMAL HIGH (ref 70–99)

## 2012-11-18 MED ORDER — CYCLOBENZAPRINE HCL 10 MG PO TABS
10.0000 mg | ORAL_TABLET | Freq: Three times a day (TID) | ORAL | Status: DC | PRN
  Administered 2012-11-19 – 2012-11-23 (×2): 10 mg via ORAL
  Filled 2012-11-18 (×3): qty 1

## 2012-11-18 MED ORDER — LIDOCAINE 5 % EX PTCH
1.0000 | MEDICATED_PATCH | CUTANEOUS | Status: DC
  Administered 2012-11-18 – 2012-12-05 (×18): 1 via TRANSDERMAL
  Filled 2012-11-18 (×20): qty 1

## 2012-11-18 MED ORDER — HYDROCHLOROTHIAZIDE 25 MG PO TABS: 12.5000 mg | ORAL_TABLET | Freq: Every day | ORAL | Status: DC

## 2012-11-18 MED ORDER — LIDOCAINE 5 % EX PTCH: 1.0000 | MEDICATED_PATCH | CUTANEOUS | Status: DC

## 2012-11-18 MED ORDER — HYDROCHLOROTHIAZIDE 12.5 MG PO CAPS
12.5000 mg | ORAL_CAPSULE | Freq: Every day | ORAL | Status: DC
  Administered 2012-11-19 – 2012-12-06 (×18): 12.5 mg via ORAL
  Filled 2012-11-18 (×21): qty 1

## 2012-11-18 MED ORDER — INSULIN ASPART 100 UNIT/ML ~~LOC~~ SOLN: 0.0000 [IU] | Freq: Three times a day (TID) | SUBCUTANEOUS | Status: DC

## 2012-11-18 MED ORDER — METHOCARBAMOL 500 MG PO TABS: 500.0000 mg | ORAL_TABLET | Freq: Four times a day (QID) | ORAL | Status: DC | PRN

## 2012-11-18 MED ORDER — DOCUSATE SODIUM 100 MG PO CAPS
100.0000 mg | ORAL_CAPSULE | Freq: Two times a day (BID) | ORAL | Status: DC
  Administered 2012-11-18 – 2012-11-21 (×6): 100 mg via ORAL
  Filled 2012-11-18 (×8): qty 1

## 2012-11-18 MED ORDER — PANTOPRAZOLE SODIUM 40 MG PO TBEC
40.0000 mg | DELAYED_RELEASE_TABLET | Freq: Every day | ORAL | Status: DC
  Administered 2012-11-19 – 2012-12-06 (×18): 40 mg via ORAL
  Filled 2012-11-18 (×15): qty 1

## 2012-11-18 MED ORDER — ACETAMINOPHEN 325 MG PO TABS
325.0000 mg | ORAL_TABLET | ORAL | Status: DC | PRN
  Administered 2012-12-03 – 2012-12-05 (×2): 650 mg via ORAL
  Filled 2012-11-18 (×2): qty 2

## 2012-11-18 MED ORDER — HYDROCODONE-ACETAMINOPHEN 5-325 MG PO TABS
1.0000 | ORAL_TABLET | ORAL | Status: DC | PRN
Start: 2012-11-18 — End: 2012-12-06
  Administered 2012-11-19: 2 via ORAL
  Administered 2012-11-19: 1 via ORAL
  Administered 2012-11-19 – 2012-12-06 (×56): 2 via ORAL
  Filled 2012-11-18 (×60): qty 2

## 2012-11-18 MED ORDER — LISINOPRIL 40 MG PO TABS
40.0000 mg | ORAL_TABLET | Freq: Every day | ORAL | Status: DC
  Administered 2012-11-19 – 2012-12-06 (×18): 40 mg via ORAL
  Filled 2012-11-18 (×21): qty 1

## 2012-11-18 MED ORDER — INSULIN ASPART 100 UNIT/ML ~~LOC~~ SOLN
0.0000 [IU] | Freq: Three times a day (TID) | SUBCUTANEOUS | Status: DC
  Administered 2012-11-21: 1 [IU] via SUBCUTANEOUS
  Administered 2012-11-23: 2 [IU] via SUBCUTANEOUS
  Administered 2012-11-23 – 2012-12-03 (×6): 1 [IU] via SUBCUTANEOUS
  Administered 2012-12-03: 2 [IU] via SUBCUTANEOUS
  Administered 2012-12-04 – 2012-12-05 (×2): 1 [IU] via SUBCUTANEOUS

## 2012-11-18 MED ORDER — DEXAMETHASONE SODIUM PHOSPHATE 4 MG/ML IJ SOLN
4.0000 mg | Freq: Four times a day (QID) | INTRAMUSCULAR | Status: DC
  Filled 2012-11-18 (×3): qty 1

## 2012-11-18 MED ORDER — ENOXAPARIN SODIUM 40 MG/0.4ML ~~LOC~~ SOLN
40.0000 mg | Freq: Every day | SUBCUTANEOUS | Status: DC
  Administered 2012-11-19 – 2012-12-05 (×17): 40 mg via SUBCUTANEOUS
  Filled 2012-11-18 (×21): qty 0.4

## 2012-11-18 MED ORDER — SORBITOL 70 % SOLN: 30.0000 mL | Freq: Every day | Status: DC | PRN

## 2012-11-18 MED ORDER — PANTOPRAZOLE SODIUM 40 MG PO TBEC: 40.0000 mg | DELAYED_RELEASE_TABLET | Freq: Every day | ORAL | Status: DC

## 2012-11-18 MED ORDER — NICOTINE 21 MG/24HR TD PT24
21.0000 mg | MEDICATED_PATCH | Freq: Every day | TRANSDERMAL | Status: DC
Start: 2012-11-19 — End: 2012-12-06
  Administered 2012-11-19 – 2012-12-05 (×17): 21 mg via TRANSDERMAL
  Filled 2012-11-18 (×21): qty 1

## 2012-11-18 MED ORDER — ONDANSETRON HCL 4 MG/2ML IJ SOLN: 4.0000 mg | Freq: Four times a day (QID) | INTRAMUSCULAR | Status: DC | PRN

## 2012-11-18 MED ORDER — DEXAMETHASONE 4 MG PO TABS
4.0000 mg | ORAL_TABLET | Freq: Four times a day (QID) | ORAL | Status: DC
  Administered 2012-11-18 – 2012-12-05 (×67): 4 mg via ORAL
  Filled 2012-11-18 (×77): qty 1

## 2012-11-18 MED ORDER — POLYETHYLENE GLYCOL 3350 17 G PO PACK
17.0000 g | PACK | Freq: Every day | ORAL | Status: DC | PRN
  Filled 2012-11-18: qty 1

## 2012-11-18 MED ORDER — SENNA 8.6 MG PO TABS
2.0000 | ORAL_TABLET | Freq: Every day | ORAL | Status: DC
  Administered 2012-11-19 – 2012-12-06 (×18): 17.2 mg via ORAL
  Filled 2012-11-18 (×21): qty 2
  Filled 2012-11-18: qty 1

## 2012-11-18 MED ORDER — LISINOPRIL 40 MG PO TABS
40.0000 mg | ORAL_TABLET | Freq: Every day | ORAL | Status: DC
  Filled 2012-11-18 (×4): qty 1

## 2012-11-18 MED ORDER — NAPROXEN 500 MG PO TABS
500.0000 mg | ORAL_TABLET | Freq: Three times a day (TID) | ORAL | Status: DC | PRN
Start: 2012-11-18 — End: 2012-12-06
  Administered 2012-11-22 (×2): 500 mg via ORAL
  Filled 2012-11-18 (×2): qty 1

## 2012-11-18 MED ORDER — HYDROCODONE-ACETAMINOPHEN 5-325 MG PO TABS
2.0000 | ORAL_TABLET | ORAL | Status: DC | PRN
  Administered 2012-11-19 (×2): 2 via ORAL
  Filled 2012-11-18: qty 2

## 2012-11-18 MED ORDER — ONDANSETRON HCL 4 MG PO TABS: 4.0000 mg | ORAL_TABLET | Freq: Four times a day (QID) | ORAL | Status: DC | PRN

## 2012-11-18 MED ORDER — NICOTINE 21 MG/24HR TD PT24: 21.0000 mg | MEDICATED_PATCH | Freq: Every day | TRANSDERMAL | Status: DC

## 2012-11-18 NOTE — Plan of Care (Addendum)
Overall Plan of Care [facility]) Patient Details Name: Debbie [name] MRN: [id number] DOB: 1976/08/31  Diagnosis: thoracic tumor s/p resection---paraplegia   Primary Diagnosis:    Paraplegia Co-morbidities: obesity, neurogenic bowel and bladder  Functional Problem List  Patient demonstrates impairments in the following areas: Balance, Bladder, Bowel, Edema, Endurance, Motor, Pain, Sensory  and Skin Integrity  Basic ADL's: grooming, bathing, dressing and toileting Advanced ADL's: simple meal preparation and light housekeeping  Transfers:  bed mobility, bed to chair, toilet, tub/shower, car and furniture Locomotion:  ambulation and wheelchair mobility  Additional Impairments:  Leisure Awareness  Anticipated Outcomes Item Anticipated Outcome  Eating/Swallowing    Basic self-care  Set-up assist  Tolieting  Set-up assist  Bowel/Bladder  Mod assist   Transfers  Mod I basic; min A car  Locomotion  Mod I w/c mobility  Communication    Cognition    Pain  Min assist   Safety/Judgment  Mod independent  Other     Therapy Plan: PT Intensity: Minimum of 1-2 x/day ,45 to 90 minutes PT Frequency: 5 out of 7 days PT Duration Estimated Length of Stay: 2-2.5 weeks PT Treatment/Interventions: Ambulation/gait training;Balance/vestibular training;Community reintegration;Discharge planning;Disease management/prevention;DME/adaptive equipment instruction;Functional electrical stimulation;Functional mobility training;Neuromuscular re-education;Pain management;Patient/family education;Psychosocial support;Skin care/wound management;Splinting/orthotics;Stair training;Therapeutic Activities;Therapeutic Exercise;UE/LE Strength taining/ROM;UE/LE Coordination activities;Wheelchair propulsion/positioning OT Intensity: Minimum of 1-2 x/day, 45 to 90 minutes OT Frequency: 5 out of 7 days OT Duration/Estimated Length of Stay: 2-2.5 weeks OT Treatment/Interventions: Balance/vestibular training;Community  reintegration;Discharge planning;DME/adaptive equipment instruction;Functional mobility training;Neuromuscular re-education;Pain management;Patient/family education;Psychosocial support;Self Care/advanced ADL retraining;Skin care/wound managment;Splinting/orthotics;Therapeutic Activities;Therapeutic Exercise;UE/LE Strength taining/ROM;UE/LE Coordination activities;Wheelchair propulsion/positioning      Therapy Recommendations: PT @([phone]::1)@ OT [id number] SLP  [id number]  Team Interventions: Item RN PT OT SLP SW TR Other  Self Care/Advanced ADL Retraining  x x      Neuromuscular Re-Education  x x      Therapeutic Activities  x x   x   UE/LE Strength Training/ROM  x x   x   UE/LE Coordination Activities  x x   x   Visual/Perceptual Remediation/Compensation         DME/Adaptive Equipment Instruction  x x   x   Therapeutic Exercise  x x   x   Balance/Vestibular Training  x x   x   Patient/Family Education x x x   x   Cognitive Remediation/Compensation         Functional Mobility Training  x x   x   Ambulation/Gait Training  x x      Stair Training  x       Wheelchair Propulsion/Positioning  x x   x   Functional Electrical Stimulation  x       Community Reintegration  x x   x   Dysphagia/[employer] Film/video editor         Bladder Management x        Bowel Management         Disease Management/Prevention x        Pain Management x x x   x   Medication Management x        Skin Care/Wound Management x x x      Splinting/Orthotics  x x      Discharge Planning  x x   x   Psychosocial Support x x x   x  Team Discharge Planning: Destination:  Home Projected Follow-up:  PT, OT and Home Health Projected Equipment Needs:  Bedside Commode, Sliding Board, Tub Bench and Wheelchair Patient/family involved in discharge planning:  Yes  MD ELOS: 12/06/12 Medical Rehab Prognosis:  Excellent Assessment: Pt  admitted for CIR therapies. The team will be addressing: Lower extremity strength, range of motion, stamina, balance, functional mobility, safety, adaptive techniques and equipment, NMR, bowel and bladder continence, ADL's, pain mgt. Goals are mod I at a wheelchair level and set up to min assist with basic self-care and bowel/bladder mgt. Pt is quite motivated

## 2012-11-18 NOTE — Progress Notes (Signed)
Patient admitted at 1410 alert and oriented x 4 . Back incision  With sutures intact . No movement in left leg . bilateral foot drop. Decrease sensation from above umbilicus  Down . Oriented patient to room and call bell system . Patient verbalized understanding of admission process . Continue with plan of care.          Debbie [name]

## 2012-11-18 NOTE — Discharge Summary (Signed)
Physician Discharge Summary  Debbie [name] NWG:[id number] DOB: [birth year] DOA: 11/07/2012  PCP: Kizzie Furnish D., PA  Admit date: 11/07/2012 Discharge date: 11/18/2012  Recommendations for Outpatient Follow-up:  1. Reconsult neurosurgery--they are managing the IV steroids  Discharge Diagnoses:  Lower extremity weakness  -Continue Decadron. 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 Jordan Likes.  -Continue PPI. pain control with Vicodin and Flexeril.  -Still has some weakness of the lower extremities with hyperreflexia  -likely inpatient rehabilitation on discharge.  -PT/OT--started 11/15/12  HTN (hypertension)  Blood pressure stable at present. Continue home meds  -Increased lisinopril to 40 mg daily  Hypokalemia  Resolved.  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 She describes pain as intermittent, sharp, 7/10 in severity when presents with no specific alleviating factors and no specific  aggravating factors. She denies fevers, chills, recent medications changes, no recent hospitalizations and no recent traveling, no sick contacts or exposures. Pt denies similar events In the past. She also experiences occasional numbness and tingling in lower extremities. Patient states that she had to use a cane in order to ambulate do to left greater than right lower extremity weakness. Neurology was consulted to see the patient. Prior to the MRI, the patient was started on Decadron with concerns of chronic progressive myelopathy and suspicion of an extrinsic lesion on her spinal cord. An MRI of her cervical, thoracic, and lumbar spine was ordered. The MRI of the thoracic spine showed a T5-T6 intradural mass measuring 50 mm x 9 mm. Urine drug screen at the time of admission was positive for opiates and cannabis. Neurosurgery was contacted after the MRI results. The patient was continued on intravenous steroids. Dr. Julio Sicks saw the patient and recommended surgical excision. On 11/11/2012, the patient underwent T5-7 laminectomy and debulking for the intradural/intramedullary spinal cord tumor with duraplasty. The patient was continued on intravenous steroids. The patient was maintained on bed rest postoperatively. The patient remained quite weak with lower extremity paraparesis postoperatively. On 11/15/2012, the patient was cleared to progress but with physical therapy and occupational therapy. PM&R was also consulted with anticipation that the patient would benefit with inpatient rehab. Surgical pathology from the surgery revealed benign mature adipose tissue suggestive of lipoma.  Regarding the patient's hypertension, the patient was restarted on her lisinopril and hydrochlorothiazide. The patient's blood pressure gradually increased through the hospitalization with systolic blood pressure 150-170. This may be in part due to the patient's pain as well as steroids. The patient's lisinopril was increased to 40  mg daily. The patient was maintained on a Nicoderm patch without complications. She did have intermittent episodes of hypokalemia. This was repleted. In addition, the patient did have constipation. This was treated with cathartics; however, the patient did require one bottle of magnesium citrate with good results.   Consultants: Neurosurgery, Dr. Jordan Likes  Discharge Exam: Filed Vitals:   11/18/12 0505  BP: 142/71  Pulse: 62  Temp: 98.1 F (36.7 C)  Resp: 20   Filed Vitals:   11/17/12 1700 11/17/12 2127 11/18/12 0123 11/18/12 0505  BP: 140/87 169/86 138/72 142/71  Pulse: 89 61 64 62  Temp: 98.6 F (37 C) 98 F (36.7 C) 98 F (36.7 C) 98.1 F (36.7 C)  TempSrc:  Oral Oral Oral  Resp: 20 18 20 20   Height:      Weight:      SpO2: 100% 100% 100% 99%   General: A&O x 3, NAD, pleasant, cooperative Cardiovascular: RRR, no rub, no gallop, no S3 Respiratory: CTAB, no wheeze, no rhonchi Abdomen:soft, nontender, nondistended, positive bowel sounds Extremities: No edema, No lymphangitis, no petechiae  Discharge Instructions     Medication List     As of 11/18/2012  8:59 AM    STOP taking these medications         acetaminophen 500 MG tablet   Commonly known as: TYLENOL      ALEVE 220 MG Caps   Generic drug: Naproxen Sodium      ibuprofen 200 MG tablet   Commonly known as: ADVIL,MOTRIN      lisinopril-hydrochlorothiazide 20-12.5 MG per tablet   Commonly known as: [employer]       HOSPITAL MEDS Lidoderm patch --apply to trapezius area in the mornings Colace 100 mg twice a day Lovenox 40 mg subcutaneous daily Dexamethasone 4 mg IV every 6 hours Hydrochlorothiazide 12.5 mg daily NovoLog sliding scale--CBGs before every meal  and at bedtime Lisinopril 40 mg daily Nicoderm patch 21 milligrams daily Protonix 40 mg by mouth daily Senokot 17.2 mg daily Norco 5/325 2 tabs every 4 hours when necessary pain Flexeril 10 mg 3 times a day when necessary pain, muscle  spasm     The results of significant diagnostics from this hospitalization (including imaging, microbiology, ancillary and laboratory) are listed below for reference.    Significant Diagnostic Studies: Mr Cervical Spine Wo Contrast  11/08/2012  *RADIOLOGY REPORT*  Clinical Data:  36 year old female with back pain, bilateral lower extremity numbness and weakness.  Worsening bowel and bladder incontinence.  Possible transverse myelitis.  Contrast: 15 ml MultiHance.  Comparison: Lumbar MRI 11/07/2012 and earlier.  MRI CERVICAL SPINE WITHOUT AND WITH CONTRAST  Technique:  Multiplanar and multiecho pulse sequences of the cervic al spine, to include the craniocervical junction and cervicothoraci c junction, were obtained according to standard protocol without and with intravenous contrast.  Findings:  Cervicomedullary junction is within normal limits. Normal cervical spinal cord.  See thoracic spinal cord abnormality below.  Normal cervical vertebral height and alignment. No marrow edema or evidence of acute osseous abnormality.  Visualized paraspinal soft tissues are within normal limits.  Minor cervical degenerative changes.  No cervical disc herniation, spinal stenosis, or significant neural foraminal stenosis.  IMPRESSION: 1.  Negative cervical MRI. 2.  Abnormal thoracic MRI, see below.  MRI THORACIC SPINE WITHOUT AND WITH CONTRAST  Technique: Multiplanar and multiecho pulse sequences of the thoracic spine were obtained without and  with intravenous contrast.  Findings:  Abnormal thoracic spinal cord expansion from T4-T5 to T6-  T7 appears to be the result of a an oblong intradural and mostly or completely intramedullary mass with intrinsic T1  hyperintensity and signal suppression on both STIR and fat saturated postcontrast T1 images.  The mass is about 5 cm in length and oval measuring up to 9 mm diameter.  There is evidence of mild cord edema immediately surrounding the lesion which extends to the mid T4  and mid T7 levels.  Following contrast there is little if any enhancement. The lesion might be slightly exophytic to the cord along the dorsal extent ( see series 15 image 19).  The epidural space at the level of the mass is within normal limits. No definite subarachnoid space abnormality.  There is no associated spinal dysraphism.  The other thoracic spinal cord levels are within normal limits. The conus was better visualized on the lumbar comparison but appears within normal limits.  No other areas of abnormal intradural enhancement.  Normal thoracic vertebral height, alignment, and marrow signal.Visualized paraspinal soft tissues are within normal limits.  Negative visualized thoracic and upper abdominal viscera.  IMPRESSION:  1.  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  Little if any enhancement.  Associated spinal cord expansion and mild cord edema.   No associated spinal dysraphism identified. 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  CT thoracic spine without contrast might be valuable to evaluate for any calcification within the mass (which would strongly indicate a dermoid). 2.  No other thoracic spine abnormality.  Study discussed with Dr. Thana Farr at [phone] hours on 11/08/2012.   Original Report Authenticated By: Erskine Speed, M.D.    Mr Lumbar Spine Wo Contrast  11/07/2012  *RADIOLOGY REPORT*  Clinical Data: Worsening bowel and bladder incontinence for the past week.  MRI LUMBAR SPINE WITHOUT CONTRAST  Technique:  Multiplanar and multiecho pulse sequences of the lumbar spine were obtained without intravenous contrast.  Comparison: 08/01/2012.  Findings: Last fully open disc space is labeled L5-S1.  Present examination incorporates from T10-11 disc space through the lower sacrum.  Conus L1 level.  No compression of the distal  cord or conus.  No abnormal signal within the distal cord or conus.  Visualized paravertebral structures without gross abnormality. Evaluation slightly limited by respiratory motion.  No worrisome bony lesion.  T10-11:  Mild left-sided facet bony overgrowth.  T11-12:  Minimal left facet bony overgrowth.  T12-L1:  Negative.  L1-2:  Negative.  L2-3:  Very small left foraminal/lateral protrusion approaching but not compressing the exiting left L2 nerve root.  Minimal bulge. Very mild facet joint degenerative changes.  L3-4:  Prominent right-sided facet joint degenerative changes containing fluid undermining the right ligamenta flava.  Mild left- sided facet joint degenerative changes.  Bulge with lateral extension approaching but not compressing the exiting L3 nerve roots.  Dorsal epidural fat.  Mild spinal stenosis  L4-5:  Mild facet joint degenerative changes greater on the right. Mild ligamentum flavum hypertrophy.  Bulge.  Left lateral disc osteophyte complex approaches but does not compress the exiting left L4 nerve root.  Very mild spinal stenosis.  L5-S1:  No significant spinal stenosis or foraminal narrowing.  IMPRESSION: No conus compression detected as cause of patient's symptoms.  Scattered degenerative changes most notable at the L3-4 level as detailed above. Findings without significant change.   Original Report Authenticated By: Lacy Duverney, M.D.  Mr Thoracic Spine W Wo Contrast  11/08/2012  *RADIOLOGY REPORT*  Clinical Data:  36 year old female with back pain, bilateral lower extremity numbness and weakness.  Worsening bowel and bladder incontinence.  Possible transverse myelitis.  Contrast: 15 ml MultiHance.  Comparison: Lumbar MRI 11/07/2012 and earlier.  MRI CERVICAL SPINE WITHOUT AND WITH CONTRAST  Technique:  Multiplanar and multiecho pulse sequences of the cervic al spine, to include the craniocervical junction and cervicothoraci c junction, were obtained according to standard protocol  without and with intravenous contrast.  Findings:  Cervicomedullary junction is within normal limits. Normal cervical spinal cord.  See thoracic spinal cord abnormality below.  Normal cervical vertebral height and alignment. No marrow edema or evidence of acute osseous abnormality.  Visualized paraspinal soft tissues are within normal limits.  Minor cervical degenerative changes.  No cervical disc herniation, spinal stenosis, or significant neural foraminal stenosis.  IMPRESSION: 1.  Negative cervical MRI. 2.  Abnormal thoracic MRI, see below.  MRI THORACIC SPINE WITHOUT AND WITH CONTRAST  Technique: Multiplanar and multiecho pulse sequences of the thoracic spine were obtained without and  with intravenous contrast.  Findings:  Abnormal thoracic spinal cord expansion from T4-T5 to T6- T7 appears to be the result of a an oblong intradural and mostly or completely intramedullary mass with intrinsic T1  hyperintensity and signal suppression on both STIR and fat saturated postcontrast T1 images.  The mass is about 5 cm in length and oval measuring up to 9 mm diameter.  There is evidence of mild cord edema immediately surrounding the lesion which extends to the mid T4 and mid T7 levels.  Following contrast there is little if any enhancement. The lesion might be slightly exophytic to the cord along the dorsal extent ( see series 15 image 19).  The epidural space at the level of the mass is within normal limits. No definite subarachnoid space abnormality.  There is no associated spinal dysraphism.  The other thoracic spinal cord levels are within normal limits. The conus was better visualized on the lumbar comparison but appears within normal limits.  No other areas of abnormal intradural enhancement.  Normal thoracic vertebral height, alignment, and marrow signal.Visualized paraspinal soft tissues are within normal limits.  Negative visualized thoracic and upper abdominal viscera.  IMPRESSION:  1.  Unusual T5-T6  intradural spinal cord mass  50 mm in length by 9 mm diameter  appears to be largely lipomatous in nature and intramedullary with possible dorsal exophytic component.  Little if any enhancement.  Associated spinal cord expansion and mild cord edema.   No associated spinal dysraphism identified. 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  CT thoracic spine without contrast might be valuable to evaluate for any calcification within the mass (which would strongly indicate a dermoid). 2.  No other thoracic spine abnormality.  Study discussed with Dr. Thana Farr at [phone] hours on 11/08/2012.   Original Report Authenticated By: Erskine Speed, M.D.      Microbiology: Recent Results (from the past 240 hour(s))  SURGICAL PCR SCREEN     Status: Normal   Collection Time   11/10/12  8:53 PM      Component Value Range Status Comment   MRSA, PCR NEGATIVE  NEGATIVE Final    Staphylococcus aureus NEGATIVE  NEGATIVE Final      Labs: Basic Metabolic Panel:  Lab 11/18/12 5409 11/15/12 0615 11/14/12 0445  NA 134* 137 138  K 3.9 4.1 --  CL 99 102 103  CO2  24 27 26   GLUCOSE 114* 118* 115*  BUN 18 22 18   CREATININE 0.56 0.65 0.64  CALCIUM 9.0 9.0 8.8  MG -- 2.4 --  PHOS -- -- --   Liver Function Tests: No results found for this basename: AST:5,ALT:5,ALKPHOS:5,BILITOT:5,PROT:5,ALBUMIN:5 in the last 168 hours No results found for this basename: LIPASE:5,AMYLASE:5 in the last 168 hours No results found for this basename: AMMONIA:5 in the last 168 hours CBC:  Lab 11/16/12 0625  WBC 23.9*  NEUTROABS --  HGB 12.8  HCT 37.7  MCV 89.8  PLT 263   Cardiac Enzymes: No results found for this basename: CKTOTAL:5,CKMB:5,CKMBINDEX:5,TROPONINI:5 in the last 168 hours BNP: No components found with this basename: POCBNP:5 CBG:  Lab 11/18/12 0715 11/17/12 2133 11/17/12 1659 11/17/12 1159 11/17/12 0850  GLUCAP 117* 120* 107* 177* 107*    Time coordinating discharge:   Greater than 30 minutes  Signed:  Ricardo Schubach, DO [facility] Pager: [phone] 11/18/2012, 8:59 AM

## 2012-11-18 NOTE — Progress Notes (Signed)
PT Cancellation Note  Patient Details Name: Debbie [name] MRN: [id number] DOB: Sep 04, 1976   Cancelled Treatment:     PT session cancelled for today due to pt d/cing to CIR today.     Verdell Face, [place] [phone] 11/18/2012

## 2012-11-18 NOTE — PMR Pre-admission (Signed)
PMR Admission Coordinator Pre-Admission Assessment  Patient: Debbie [name] is an 36 y.o., female MRN: [id number] DOB: [birth year] Height: 5\' 8"  (172.7 cm) Weight: 95.9 kg (211 lb 6.7 oz)              Insurance Information HMO:      PPO:       PCP:       IPA:       80/20:       OTHER: yes PRIMARY: Medicaid [place] [employer]      Policy#: [id number] n      Subscriber: pt                  Emergency Contact Information Contact Information    Name Relation Home Work Mobile   Loye,Caroline                       Garlon Hatchet Ex-mother-in-law           Clearence Cheek [phone]  [phone]       [phone]     Current Medical History  Patient Admitting Diagnosis: 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 female with a history of hypertension who presented to the emergency room on 11/07/12 with complaint of increasing weakness and numbness of lower extremities over the past 6 months as well as increasing difficulty with standing and walking with recurrent falls. A week PTA she developed incontinence of urine and bowel, BLE spasms L>R as well as intermittent back and hip pain. She was evaluated by Dr. Roseanne Reno who recommended IV steriods as well as MRI thoracic spine for work up of chronic progressive myelopathy. 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 Patient underwent T5-T7 lam for resection and debulking of tumor on 11/11/12 by Dr. Jordan Likes. Motor exam with slow return but patient reporting some improvement in sensation RLE>  The patient developed bowel and bladder incontinence about 2 weeks prior to admission          Past Medical History  Past Medical History  Diagnosis Date  . Hypertension     Family History  family history includes Diabetes in her mother and Multiple sclerosis in her father.  Prior Rehab/Hospitalizations: no   Current Medications  Current facility-administered  medications:0.9 %  sodium chloride infusion, 250 mL, Intravenous, Continuous, Temple Pacini, MD;  acetaminophen (TYLENOL) suppository 650 mg, 650 mg, Rectal, Q4H PRN, Temple Pacini, MD;  acetaminophen (TYLENOL) tablet 650 mg, 650 mg, Oral, Q4H PRN, Temple Pacini, MD;  alum & mag hydroxide-simeth (MAALOX/MYLANTA) 200-200-20 MG/5ML suspension 30 mL, 30 mL, Oral, Q6H PRN, Temple Pacini, MD bisacodyl (DULCOLAX) suppository 10 mg, 10 mg, Rectal, Daily PRN, Temple Pacini, MD;  cyclobenzaprine (FLEXERIL) tablet 10 mg, 10 mg, Oral, TID PRN, Temple Pacini, MD, 10 mg at 11/17/12 1724;  dexamethasone (DECADRON) injection 4 mg, 4 mg, Intravenous, Q6H, Noel Christmas, 4 mg at 11/18/12 0608;  docusate sodium (COLACE) capsule 100 mg, 100 mg, Oral, BID, Catarina Hartshorn, MD, 100 mg at 11/17/12 2149 enoxaparin (LOVENOX) injection 40 mg, 40 mg, Subcutaneous, Daily, Maeola Harman, MD, 40 mg at 11/17/12 1017;  hydrochlorothiazide (MICROZIDE) capsule 12.5 mg, 12.5 mg, Oral, Daily, Dorothea Ogle, MD, 12.5 mg at 11/17/12 1016;  HYDROcodone-acetaminophen (NORCO/VICODIN) 5-325 MG per tablet 1-2 tablet, 1-2 tablet, Oral, Q4H PRN, Temple Pacini, MD, 2 tablet at 11/18/12 0102 HYDROmorphone (DILAUDID) injection 0.5-1 mg, 0.5-1 mg, Intravenous,  Q2H PRN, Temple Pacini, MD;  insulin aspart (novoLOG) injection 0-9 Units, 0-9 Units, Subcutaneous, TID WC, Stephani Police, PA, 2 Units at 11/17/12 1252;  lidocaine (LIDODERM) 5 % 1 patch, 1 patch, Transdermal, Q24H, Catarina Hartshorn, MD, 1 patch at 11/17/12 1440;  lisinopril (PRINIVIL,ZESTRIL) tablet 40 mg, 40 mg, Oral, Daily, Catarina Hartshorn, MD menthol-cetylpyridinium (CEPACOL) lozenge 3 mg, 1 lozenge, Oral, PRN, Temple Pacini, MD;  naproxen (NAPROSYN) tablet 500 mg, 500 mg, Oral, TID PRN, Dorothea Ogle, MD;  nicotine (NICODERM CQ - dosed in mg/24 hours) patch 21 mg, 21 mg, Transdermal, Daily, Nishant Dhungel, MD, 21 mg at 11/17/12 1015;  ondansetron (ZOFRAN) injection 4 mg, 4 mg, Intravenous, Q4H PRN, Temple Pacini,  MD oxyCODONE-acetaminophen (PERCOCET/ROXICET) 5-325 MG per tablet 1-2 tablet, 1-2 tablet, Oral, Q4H PRN, Temple Pacini, MD, 2 tablet at 11/12/12 1738;  pantoprazole (PROTONIX) EC tablet 40 mg, 40 mg, Oral, Daily, Nishant Dhungel, MD, 40 mg at 11/17/12 1020;  phenol (CHLORASEPTIC) mouth spray 1 spray, 1 spray, Mouth/Throat, PRN, Temple Pacini, MD;  polyethylene glycol (MIRALAX / GLYCOLAX) packet 17 g, 17 g, Oral, Daily PRN, Temple Pacini, MD senna (SENOKOT) tablet 17.2 mg, 2 tablet, Oral, Daily, David Tat, MD, 17.2 mg at 11/17/12 1015;  sodium chloride 0.9 % injection 3 mL, 3 mL, Intravenous, Q12H, Temple Pacini, MD, 3 mL at 11/17/12 2149;  sodium chloride 0.9 % injection 3 mL, 3 mL, Intravenous, PRN, Temple Pacini, MD, 3 mL at 11/18/12 0106;  zolpidem (AMBIEN) tablet 5 mg, 5 mg, Oral, QHS PRN, Temple Pacini, MD  Patients Current Diet: General  Precautions / Restrictions Precautions Precautions: Fall Restrictions Weight Bearing Restrictions: No   Prior Activity Level  Independent Home Assistive Devices / Equipment Home Assistive Devices/Equipment: Cane (specify quad or straight) Home Adaptive Equipment: Walker - rolling;Straight cane  Prior Functional Level Prior Function Level of Independence: Independent Able to Take Stairs?: Reciprically Driving: Yes Vocation: Unemployed  Current Functional Level Cognition  Arousal/Alertness: Awake/alert Overall Cognitive Status: Appears within functional limits for tasks assessed/performed Orientation Level: Oriented X4    Extremity Assessment (includes Sensation/Coordination)  RUE ROM/Strength/Tone: Within functional levels  RLE ROM/Strength/Tone: Deficits RLE ROM/Strength/Tone Deficits: Hip 2/5, DF and Knee extension 1/5. No PF or knee flexion. PROM WFL RLE Sensation: Deficits RLE Sensation Deficits: decreased sensation, able to feel pressure    ADLs  Eating/Feeding: Simulated;Independent Where Assessed - Eating/Feeding: Edge of  bed;Chair Grooming: Simulated;Set up Where Assessed - Grooming: Supported sitting Upper Body Bathing: Simulated;Set up Where Assessed - Upper Body Bathing: Supported sitting Lower Body Bathing: Simulated;Maximal assistance Where Assessed - Lower Body Bathing: Lean right and/or left;Supported sitting Upper Body Dressing: Simulated;Moderate assistance Where Assessed - Upper Body Dressing: Supported sitting Lower Body Dressing: Simulated;+1 Total assistance Where Assessed - Lower Body Dressing: Lean right and/or left;Supported sitting Toilet Transfer: Simulated;+2 Total assistance Toilet Transfer: Patient Percentage: 60% Toilet Transfer Method: Anterior-posterior Acupuncturist:  Nurse, children's) Toileting - Clothing Manipulation and Hygiene: Performed;+1 Total assistance Where Assessed - Toileting Clothing Manipulation and Hygiene: Supine, head of bed flat;Rolling right and/or left Transfers/Ambulation Related to ADLs: Total A +2  (pt=60%)) for anterior/posterior transfers    Mobility  Bed Mobility: Supine to Sit;Sitting - Scoot to Edge of Bed Rolling Right: 4: Min guard Rolling Left: 4: Min assist Supine to Sit: 5: Supervision Sitting - Scoot to Edge of Bed: 4: Min assist Sit to Supine: 3: Mod assist    Transfers  Transfers: Personal assistant  Anterior-Posterior Transfer: 4: Min Facilities manager Transfers: Patient Percentage: 60%    Ambulation / Gait / Stairs / Psychologist, prison and probation services  Ambulation/Gait Ambulation/Gait Assistance: Not tested (comment) Corporate treasurer: Yes Wheelchair Assistance: 5: Financial planner Details (indicate cue type and reason): No cues needed for propulsion, assist with fatigue towards end of distance. Pt able to put 1 leg rest on although still required assistance for bottom of leg rests Wheelchair Propulsion: Both upper extremities Wheelchair Parts Management: Needs assistance Distance: 400     Posture / Balance Static Sitting Balance Static Sitting - Balance Support: Bilateral upper extremity supported Static Sitting - Level of Assistance: 4: Min assist Static Sitting - Comment/# of Minutes: Pt with decreased trunk control due to lipoma removal T5-T7, presents like an incomplete para    Special needs/care consideration BiPAP/CPAP no CPM no Continuous Drip IV no Dialysis no        Days no Life Vest no Oxygen no Special Bed no Trach Size no Wound Vac (area) no      Location no Skin incision                              Location back Bowel mgmt: LBM 12/14 occ awareness/occ incontinence Bladder mgmt: Foley d/c'd am 12/16 Diabetic mgmt no     Previous Home Environment Living Arrangements: Spouse/significant other;Children Lives With: Significant other Available Help at Discharge: Family;Available PRN/intermittently (has 2 kids (5,12)) Type of Home: Mobile home Home Layout: One level Home Access: Stairs to enter Entrance Stairs-Rails: None Entrance Stairs-Number of Steps: 2 Bathroom Shower/Tub: Counselling psychologist: No Home Care Services: No  Discharge Living Setting Plans for Discharge Living Setting: Patient's home Type of Home at Discharge: Mobile home Discharge Home Layout: One level Discharge Home Access: Stairs to enter Entrance Stairs-Number of Steps: 2 to porch & then step into hs.  Can put up ramp if needed. Do you have any problems obtaining your medications?: No  Social/Family/Support Systems Patient Roles: Parent Contact Information: [phone] pt's cell Anticipated Caregiver: Ex mother-in-law, Andres Shad & friend, Ivin Booty Anticipated Caregiver's Contact Information: Garlon Hatchet, [phone] & [phone] Ability/Limitations of Caregiver: S-light min assist Caregiver Availability: 24/7 Discharge Plan Discussed with Primary Caregiver: Yes Is Caregiver In Agreement with Plan?:  Yes Does Caregiver/Family have Issues with Lodging/Transportation while Pt is in Rehab?: No    Goals/Additional Needs Patient/Family Goal for Rehab: Mod I Expected length of stay: @2  weeks Pt/Family Agrees to Admission and willing to participate: Yes Program Orientation Provided & Reviewed with Pt/Caregiver Including Roles  & Responsibilities: Yes   Decrease burden of Care through IP rehab admission: Specialzed equipment needs, Bowel and bladder program and Patient/family education   Possible need for SNF placement upon discharge: no   Patient Condition: Please see physician update to information in consult dated 11/14/12.  Preadmission Screen Completed By:  Brock Ra, 11/18/2012 9:40 AM ______________________________________________________________________   Discussed status with Dr. Riley Kill on 11/18/12 at 9:41am and received telephone approval for admission today.  Admission Coordinator:  Brock Ra, time 9:42am/Date 11/18/12

## 2012-11-18 NOTE — H&P (Signed)
Physical Medicine and Rehabilitation Admission H&P  Chief Complaint   Patient presents with   .  Back Pain   :  Debbie [name] is a 36 y.o. female with a history of hypertension and tobacco abuse who presented to the emergency room on 11/07/12 with complaint of increasing weakness and numbness of lower extremities over the past 6 months as well as increasing difficulty with standing and walking with recurrent falls. A week PTA she developed incontinence of urine and bowel, BLE spams L>R as well as intermittent back and hip pain. She was evaluated by Dr. Roseanne Reno who recommended IV steriods as well as MRI thoracic spine for work up of chronic progressive myelopathy. 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 Patient underwent T5-T7 lam for resection and debulking of tumor on 11/11/12 by Dr. Jordan Likes and initially placed on bed rest. Maintained on subcutaneous Lovenox for DVT prophylaxis. Decadron protocol ongoing as per neurosurgery and presently on 4 mg every 6 hours and plan slow taper. Close monitoring of blood sugars while on steroids. Motor exam with slow return but patient reporting some improvement in sensation RLE. Physical occupational therapy evaluations completed an ongoing. Recommendations were made for physical medicine rehabilitation consultation consider inpatient rehabilitation services. Patient was admitted for comprehensive rehabilitation program  Review of Systems  HENT: Negative for hearing loss.  Eyes: Negative for blurred vision.  Respiratory: Negative for shortness of breath.  Cardiovascular: Negative for chest pain and palpitations.  Gastrointestinal: Positive for constipation. Negative for nausea and vomiting.  Musculoskeletal:  Left foot drop With inversion for past 6 months.  Neurological: Positive for tingling, sensory change and focal weakness. Negative for dizziness and headaches  Past Medical History   Diagnosis  Date   .  Hypertension     Past  Surgical History   Procedure  Date   .  Fracture surgery    .  Cesarean section    .  Laminectomy  11/11/2012     Procedure: THORACIC LAMINECTOMY FOR TUMOR; Surgeon: Temple Pacini, MD; Location: [facility]; Service: Neurosurgery; Laterality: N/A; thracic laminectomy for intradural and intramedullary neoplasm    Family History   Problem  Relation  Age of Onset   .  Multiple sclerosis  Father    .  Diabetes  Mother     Social History: reports that she has been smoking Cigarettes. She has a 15 pack-year smoking history. She does not have any smokeless tobacco history on file. She reports that she drinks alcohol. She reports that she does not use illicit drugs.  Allergies:  Allergies   Allergen  Reactions   .  Honey Bee Treatment (Bee Venom)  Anaphylaxis and Itching    Medications Prior to Admission   Medication  Sig  Dispense  Refill   .  acetaminophen (TYLENOL) 500 MG tablet  Take 2,000 mg by mouth every 6 (six) hours as needed. For pain.     Marland Kitchen  ibuprofen (ADVIL,MOTRIN) 200 MG tablet  Take 800-1,000 mg by mouth every 6 (six) hours as needed. For pain     .  lisinopril-hydrochlorothiazide (PRINZIDE,ZESTORETIC) 20-12.5 MG per tablet  Take 1 tablet by mouth daily.     .  Naproxen Sodium (ALEVE) 220 MG CAPS  Take 660 mg by mouth 3 (three) times daily as needed. For pain      Home:  Home Living  Lives With: Significant other  Available Help at Discharge: Family;Available PRN/intermittently (has 2 kids (5,12))  Type of  Home: Mobile home  Home Access: Stairs to enter  Entrance Stairs-Number of Steps: 2  Entrance Stairs-Rails: None  Home Layout: One level  Bathroom Shower/Tub: Sports administrator: Standard  Bathroom Accessibility: No  Home Adaptive Equipment: Walker - rolling;Straight cane  Functional History:  Prior Function  Able to Take Stairs?: Reciprically  Driving: Yes  Vocation: Unemployed  Functional Status:  Mobility:  Bed Mobility  Bed Mobility:  Supine to Sit;Sitting - Scoot to Edge of Bed  Rolling Right: 4: Min guard  Rolling Left: 4: Min assist  Supine to Sit: 5: Supervision  Sitting - Scoot to Edge of Bed: 4: Min assist  Sit to Supine: 3: Mod assist  Transfers  Transfers: Warden/ranger: 4: Min Ambulance person Transfers: Patient Percentage: 60%  Ambulation/Gait  Ambulation/Gait Assistance: Not tested (comment)  Engineer, drilling Mobility: Yes  Wheelchair Assistance: 5: Scientific laboratory technician Details (indicate cue type and reason): No cues needed for propulsion, assist with fatigue towards end of distance. Pt able to put 1 leg rest on although still required assistance for bottom of leg rests  Wheelchair Propulsion: Both upper extremities  Wheelchair Parts Management: Needs assistance  Distance: 400  ADL:  ADL  Eating/Feeding: Simulated;Independent  Where Assessed - Eating/Feeding: Edge of bed;Chair  Grooming: Simulated;Set up  Where Assessed - Grooming: Supported sitting  Upper Body Bathing: Simulated;Set up  Where Assessed - Upper Body Bathing: Supported sitting  Lower Body Bathing: Simulated;Maximal assistance  Where Assessed - Lower Body Bathing: Lean right and/or left;Supported sitting  Upper Body Dressing: Simulated;Moderate assistance  Where Assessed - Upper Body Dressing: Supported sitting  Lower Body Dressing: Simulated;+1 Total assistance  Where Assessed - Lower Body Dressing: Lean right and/or left;Supported sitting  Toilet Transfer: Simulated;+2 Total assistance  Toilet Transfer Method: Anterior-posterior  Acupuncturist: (Recliner)  Transfers/Ambulation Related to ADLs: Total A +2 (pt=60%)) for anterior/posterior transfers  Cognition:  Cognition  Arousal/Alertness: Awake/alert  Orientation Level: Oriented X4  Cognition  Overall Cognitive Status: Appears within functional limits for tasks assessed/performed   Arousal/Alertness: Awake/alert  Orientation Level: Appears intact for tasks assessed  Behavior During Session: [facility] for tasks performed  Blood pressure 142/71, pulse 62, temperature 98.1 F (36.7 C), temperature source Oral, resp. rate 20, height 5\' 8"  (1.727 m), weight 95.9 kg (211 lb 6.7 oz), last menstrual period 11/07/2012, SpO2 99.00%.  Physical Exam  Nursing note and vitals reviewed.  Constitutional: She is oriented to person, place, and time. She appears well-developed and well-nourished. obese Eyes: Pupils are equal, round, and reactive to light.  Neck: Normal range of motion.  Cardiovascular: Normal rate and regular rhythm.  Pulmonary/Chest: Effort normal and breath sounds normal.  Abdominal: Soft. Bowel sounds are normal.  Neurological: She is alert and oriented to person, place, and time. She displays abnormal reflex. No cranial nerve deficit. Coordination abnormal.  Skin: Skin is warm and dry. Wound intact.  Motor: 5/5 in bilateral deltoid, biceps, triceps and grip  2 minus knee and hip extensor synergy in the right lower extremity proximally, at the ankle she was 1-2/5 with ADF and APF. LLE is 1-2 proximally with trace distally. She did have general sense to touch but could not differentiate pain/light touch, temp, etc. Sensation diminished below the xiphoid process  Results for orders placed during the hospital encounter of 11/07/12 (from the past 48 hour(s))   GLUCOSE, CAPILLARY Status: Abnormal    Collection Time    11/16/12 11:52 AM  Component  Value  Range  Comment    Glucose-Capillary  111 (*)  70 - 99 mg/dL     Comment 1  Documented in Chart      Comment 2  Notify RN     GLUCOSE, CAPILLARY Status: Abnormal    Collection Time    11/16/12 4:49 PM   Component  Value  Range  Comment    Glucose-Capillary  121 (*)  70 - 99 mg/dL    GLUCOSE, CAPILLARY Status: Abnormal    Collection Time    11/16/12 9:09 PM   Component  Value  Range  Comment    Glucose-Capillary  121  (*)  70 - 99 mg/dL    GLUCOSE, CAPILLARY Status: Abnormal    Collection Time    11/17/12 8:50 AM   Component  Value  Range  Comment    Glucose-Capillary  107 (*)  70 - 99 mg/dL    GLUCOSE, CAPILLARY Status: Abnormal    Collection Time    11/17/12 11:59 AM   Component  Value  Range  Comment    Glucose-Capillary  177 (*)  70 - 99 mg/dL     Comment 1  Notify RN      Comment 2  Documented in Chart     GLUCOSE, CAPILLARY Status: Abnormal    Collection Time    11/17/12 4:59 PM   Component  Value  Range  Comment    Glucose-Capillary  107 (*)  70 - 99 mg/dL     Comment 1  Notify RN      Comment 2  Documented in Chart     GLUCOSE, CAPILLARY Status: Abnormal    Collection Time    11/17/12 9:33 PM   Component  Value  Range  Comment    Glucose-Capillary  120 (*)  70 - 99 mg/dL     Comment 1  Documented in Chart      Comment 2  Notify RN     BASIC METABOLIC PANEL Status: Abnormal    Collection Time    11/18/12 5:00 AM   Component  Value  Range  Comment    Sodium  134 (*)  135 - 145 mEq/L     Potassium  3.9  3.5 - 5.1 mEq/L     Chloride  99  96 - 112 mEq/L     CO2  24  19 - 32 mEq/L     Glucose, Bld  114 (*)  70 - 99 mg/dL     BUN  18  6 - 23 mg/dL     Creatinine, Ser  6.43  0.50 - 1.10 mg/dL     Calcium  9.0  8.4 - 10.5 mg/dL     GFR calc non Af Amer  >90  >90 mL/min     GFR calc Af Amer  >90  >90 mL/min    GLUCOSE, CAPILLARY Status: Abnormal    Collection Time    11/18/12 7:15 AM   Component  Value  Range  Comment    Glucose-Capillary  117 (*)  70 - 99 mg/dL     No results found.  Post Admission Physician Evaluation:  1. Functional deficits secondary to T5-6 intradural mass s/p T5-7 lami/resection/debulking 11/11/12. 2. Patient is admitted to receive collaborative, interdisciplinary care between the physiatrist, rehab nursing staff, and therapy team. 3. Patient's level of medical complexity and substantial therapy needs in context of that medical necessity cannot be provided at  a lesser intensity of care  such as a SNF. 4. Patient has experienced substantial functional loss from his/her baseline which was documented above under the "Functional History" and "Functional Status" headings. Judging by the patient's diagnosis, physical exam, and functional history, the patient has potential for functional progress which will result in measurable gains while on inpatient rehab. These gains will be of substantial and practical use upon discharge in facilitating mobility and self-care at the household level. 5. Physiatrist will provide 24 hour management of medical needs as well as oversight of the therapy plan/treatment and provide guidance as appropriate regarding the interaction of the two. 6. 24 hour rehab nursing will assist with bladder management, bowel management, safety, skin/wound care, disease management, medication administration, pain management and patient education and help integrate therapy concepts, techniques,education, etc. 7. PT will assess and treat for: Lower extremity strength, range of motion, stamina, balance, functional mobility, safety, adaptive techniques and equipment, NMR. Goals are: mod I wheelchair, supervision for gait. 8. OT will assess and treat for: ADL's, functional mobility, safety, upper extremity strength, adaptive techniques and equipment, NMR. Goals are: supervision . 9. SLP will assess and treat for: n/a. Goals are: n/a. 10. Case Management and Social Worker will assess and treat for psychological issues and discharge planning. 11. Team conference will be held weekly to assess progress toward goals and to determine barriers to discharge. 12. Patient will receive at least 3 hours of therapy per day at least 5 days per week. 13. ELOS: 10-12 days days Prognosis: excellent Medical Problem List and Plan:  1. 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 Status post T5-T7 laminectomy for resection and debulking of  tumor 11/11/2012  2. DVT Prophylaxis/Anticoagulation: Subcutaneous Lovenox. Monitor platelet counts any signs of bleeding  3. Pain Management: Percocet as needed and Naprosyn. Lidoderm patch every 12 hours  4. Hypertension. Hydrochlorothiazide 12.5 mg daily, lisinopril 40 mg daily. Monitor with increased mobility  5. Neuropsych: This patient is capable of making decisions on his/her own behalf.  6. Tobacco abuse. NicoDerm patch. Provide counseling   Ivory Broad, MD 11/18/2012

## 2012-11-18 NOTE — Progress Notes (Signed)
Bed available.  Plan to admit pt to CIR today if ok medically. [phone]

## 2012-11-19 ENCOUNTER — Inpatient Hospital Stay (HOSPITAL_COMMUNITY): Payer: Medicaid Other | Admitting: Physical Therapy

## 2012-11-19 ENCOUNTER — Inpatient Hospital Stay (HOSPITAL_COMMUNITY): Payer: Medicaid Other

## 2012-11-19 ENCOUNTER — Inpatient Hospital Stay (HOSPITAL_COMMUNITY): Payer: Medicaid Other | Admitting: Occupational Therapy

## 2012-11-19 DIAGNOSIS — C720 Malignant neoplasm of spinal cord: Secondary | ICD-10-CM

## 2012-11-19 DIAGNOSIS — G8220 Paraplegia, unspecified: Secondary | ICD-10-CM

## 2012-11-19 DIAGNOSIS — N319 Neuromuscular dysfunction of bladder, unspecified: Secondary | ICD-10-CM

## 2012-11-19 LAB — GLUCOSE, CAPILLARY
Glucose-Capillary: 91 mg/dL (ref 70–99)
Glucose-Capillary: 150 mg/dL — ABNORMAL HIGH (ref 70–99)

## 2012-11-19 LAB — COMPREHENSIVE METABOLIC PANEL
Alkaline Phosphatase: 62 U/L (ref 39–117)
BUN: 19 mg/dL (ref 6–23)
CO2: 29 mEq/L (ref 19–32)
Chloride: 98 mEq/L (ref 96–112)
Creatinine, Ser: 0.61 mg/dL (ref 0.50–1.10)
GFR calc non Af Amer: >90 mL/min (ref >90)
Potassium: 3.9 mEq/L (ref 3.5–5.1)
Total Bilirubin: 0.3 mg/dL (ref 0.3–1.2)

## 2012-11-19 LAB — CBC WITH DIFFERENTIAL/PLATELET
HCT: 40 % (ref 36.0–46.0)
Hemoglobin: 13.2 g/dL (ref 12.0–15.0)
Lymphocytes Relative: 13 % (ref 12–46)
Lymphs Abs: 2.9 10*3/uL (ref 0.7–4.0)
Monocytes Absolute: 1.9 10*3/uL — ABNORMAL HIGH (ref 0.1–1.0)
Monocytes Relative: 8 % (ref 3–12)
Neutro Abs: 18.2 10*3/uL — ABNORMAL HIGH (ref 1.7–7.7)
Neutrophils Relative %: 79 % — ABNORMAL HIGH (ref 43–77)
RBC: 4.33 MIL/uL (ref 3.87–5.11)
WBC: 23 10*3/uL — ABNORMAL HIGH (ref 4.0–10.5)

## 2012-11-19 NOTE — Evaluation (Signed)
Physical Therapy Assessment and Plan  Patient Details  Name: Debbie [name] MRN: [id number] Date of Birth: 05/17/1976  PT Diagnosis: Difficulty walking, Hypertonia, Impaired sensation, Muscle spasms, Muscle weakness, Paraplegia and Pain in thoracic spine Rehab Potential: Excellent ELOS: 2-2.5 weeks   Today's Date: 11/19/2012 Time: 0930-1030 Time Calculation (min): 60 min  Problem List:  Patient Active Problem List  Diagnosis  . Lower extremity weakness  . Hypokalemia  . HTN (hypertension)  . Myelopathy  . Paraparesis of both lower limbs  . Spinal cord compression  . Intramedullary abnormality of spinal cord  . Paraplegia    Past Medical History:  Past Medical History  Diagnosis Date  . Hypertension    Past Surgical History:  Past Surgical History  Procedure Date  . Fracture surgery   . Cesarean section   . Laminectomy 11/11/2012    Procedure: THORACIC LAMINECTOMY FOR TUMOR;  Surgeon: Temple Pacini, MD;  Location: [facility];  Service: Neurosurgery;  Laterality: N/A;  thracic laminectomy for intradural and intramedullary neoplasm    Assessment & Plan Clinical Impression: Patient is a 36 y.o. year old female with recent admission to the hospital with a history of hypertension and tobacco abuse who presented to the emergency room on 11/07/12 with complaint of increasing weakness and numbness of lower extremities over the past 6 months as well as increasing difficulty with standing and walking with recurrent falls. A week PTA she developed incontinence of urine and bowel, BLE spams L>R as well as intermittent back and hip pain. She was evaluated by Dr. Roseanne Reno who recommended IV steriods as well as MRI thoracic spine for work up of chronic progressive myelopathy. 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 Patient underwent T5-T7 lam for resection and debulking of tumor on 11/11/12 by Dr. Jordan Likes and initially placed on bed rest. Maintained on subcutaneous Lovenox  for DVT prophylaxis. Decadron protocol ongoing as per neurosurgery and presently on 4 mg every 6 hours and plan slow taper. Close monitoring of blood sugars while on steroids. Motor exam with slow return but patient reporting some improvement in sensation RLE. Patient transferred to CIR on 11/18/2012 .   Patient currently requires max with mobility secondary to muscle weakness, muscle joint tightness and muscle paralysis, abnormal tone and decreased sitting balance, decreased postural control and decreased balance strategies.  Prior to hospitalization, patient was independent until a few months ago with mobility and lived with Significant other;Son;Daughter (kids are 23 and 37 yo) in a Mobile home home.  Home access is 2Stairs to enter.  Patient will benefit from skilled PT intervention to maximize safe functional mobility, minimize fall risk and decrease caregiver burden for planned discharge home with 24 hour supervision.  Anticipate patient will benefit from follow up HH at discharge.  PT - End of Session Endurance Deficit: Yes PT Assessment Rehab Potential: Excellent Barriers to Discharge: Inaccessible home environment PT Plan PT Intensity: Minimum of 1-2 x/day ,45 to 90 minutes PT Frequency: 5 out of 7 days PT Duration Estimated Length of Stay: 2-2.5 weeks PT Treatment/Interventions: Ambulation/gait training;Balance/vestibular training;Community reintegration;Discharge planning;Disease management/prevention;DME/adaptive equipment instruction;Functional electrical stimulation;Functional mobility training;Neuromuscular re-education;Pain management;Patient/family education;Psychosocial support;Skin care/wound management;Splinting/orthotics;Stair training;Therapeutic Activities;Therapeutic Exercise;UE/LE Strength taining/ROM;UE/LE Coordination activities;Wheelchair propulsion/positioning PT Recommendation Follow Up Recommendations: Home health PT;24 hour supervision/assistance Equipment  Recommended: Wheelchair (measurements);Wheelchair cushion (measurements);Sliding board  PT Evaluation Precautions/Restrictions Precautions Precautions: Fall Precaution Comments: paraplegia Restrictions Weight Bearing Restrictions: No   Pain C/o pain in thoracic spine - premedicated. Home Living/Prior Functioning Home  Living Lives With: Significant other;Son;Daughter (kids are 55 and 37 yo) Available Help at Discharge: Family;Available PRN/intermittently Type of Home: Mobile home Home Access: Stairs to enter Entrance Stairs-Number of Steps: 2 Entrance Stairs-Rails: None Home Layout: One level Bathroom Shower/Tub: Forensic scientist: Standard Bathroom Accessibility: No Home Adaptive Equipment: Walker - rolling;Straight cane;Quad cane Additional Comments: discussed having a ramp built for home access with w/c Prior Function Level of Independence: Independent with basic ADLs;Independent with homemaking with ambulation;Independent with gait;Independent with transfers Able to Take Stairs?: Reciprically Driving: Yes Vocation: Unemployed Vision/Perception  Vision - History Baseline Vision: No visual deficits Patient Visual Report: No change from baseline Vision - Assessment Eye Alignment: Within Functional Limits Vision Assessment: Vision not tested Perception Perception: Within Functional Limits Praxis Praxis: Intact  Cognition Overall Cognitive Status: Appears within functional limits for tasks assessed Arousal/Alertness: Awake/alert Orientation Level: Oriented X4 Memory: Appears intact Awareness: Appears intact Problem Solving: Appears intact Safety/Judgment: Appears intact Sensation Sensation Light Touch: Impaired Detail Light Touch Impaired Details: Impaired RLE;Impaired LLE Stereognosis: Appears Intact (BUEs) Hot/Cold: Appears Intact (BUEs) Proprioception: Impaired Detail Proprioception Impaired Details: Impaired RLE;Impaired  LLE Coordination Gross Motor Movements are Fluid and Coordinated: No (LE's) Fine Motor Movements are Fluid and Coordinated: Yes (BUEs) Motor  Motor Motor: Abnormal tone;Paraplegia;Abnormal postural alignment and control    Psychologist, counselling Assistance: 5: Supervision  Trunk/Postural Assessment  Cervical Assessment Cervical Assessment: Within Functional Limits Thoracic Assessment Thoracic Assessment: Exceptions to [facility] (pain due to sx; no noted precautions) Lumbar Assessment Lumbar Assessment: Within Functional Limits Postural Control Postural Control: Deficits on evaluation Trunk Control: decreased sitting balance; tends to lose balance posterior  Balance Balance Balance Assessed: Yes Static Sitting Balance Static Sitting - Level of Assistance: 5: Stand by assistance;4: Min assist Dynamic Sitting Balance Dynamic Sitting - Level of Assistance: 3: Mod assist Extremity Assessment  RUE Assessment RUE Assessment: Within Functional Limits (can benefit from UE strengthening) LUE Assessment LUE Assessment: Within Functional Limits (can benefit from UE strengthening) RLE Assessment RLE Assessment: Exceptions to [facility] RLE Strength RLE Overall Strength Comments: trace movement in toe extension; no active movement noted otherwise RLE Tone RLE Tone: Moderate LLE Assessment LLE Assessment: Exceptions to [facility] LLE Strength LLE Overall Strength Comments: no active movement noted LLE Tone LLE Tone: Moderate LLE Tone Comments: increased with mobility  See FIM for current functional status Refer to Care Plan for Long Term Goals  Recommendations for other services: None  Discharge Criteria: Patient will be discharged from PT if patient refuses treatment 3 consecutive times without medical reason, if treatment goals not met, if there is a change in medical status, if patient makes no progress towards goals or if patient is discharged from hospital.  The above  assessment, treatment plan, treatment alternatives and goals were discussed and mutually agreed upon: by patient  Individual treatment initiated with focus on transfer technique using slideboard, limits of stability sitting EOB, w/c parts management and w/c propulsion, and education and demonstration on pressure relief techniques when OOB in w/c. Pt very motivated and already practicing pressure relief techniques and fiance has been assisting with PROM to bilateral LE's per pt report. Basic back support placed in w/c for improved postural alignment and comfort. Also introduced the use of leg loops with pt and left in room to work with in future sessions.   Karolee Stamps [facility] 11/19/2012, 11:47 AM

## 2012-11-19 NOTE — Evaluation (Signed)
Occupational Therapy Assessment and Plan & Session Note  Patient Details  Name: Debbie [name] MRN: [id number] Date of Birth: [birth year]  OT Diagnosis: abnormal posture, acute pain and paraparesis at level T5-T7 Rehab Potential: Rehab Potential: Excellent ELOS: 2-2.5 weeks   Today's Date: 11/19/2012  ASSESSMENT AND PLAN  Problem List:  Patient Active Problem List  Diagnosis  . Lower extremity weakness  . Hypokalemia  . HTN (hypertension)  . Myelopathy  . Paraparesis of both lower limbs  . Spinal cord compression  . Intramedullary abnormality of spinal cord  . Paraplegia    Past Medical History:  Past Medical History  Diagnosis Date  . Hypertension    Past Surgical History:  Past Surgical History  Procedure Date  . Fracture surgery   . Cesarean section   . Laminectomy 11/11/2012    Procedure: THORACIC LAMINECTOMY FOR TUMOR;  Surgeon: Temple Pacini, MD;  Location: [facility];  Service: Neurosurgery;  Laterality: N/A;  thracic laminectomy for intradural and intramedullary neoplasm    Clinical Impression: Debbie [name] is a 36 y.o. female with a history of hypertension and tobacco abuse who presented to the emergency room on 11/07/12 with complaint of increasing weakness and numbness of lower extremities over the past 6 months as well as increasing difficulty with standing and walking with recurrent falls. A week PTA she developed incontinence of urine and bowel, BLE spams L>R as well as intermittent back and hip pain. She was evaluated by Dr. Roseanne Reno who recommended IV steriods as well as MRI thoracic spine for work up of chronic progressive myelopathy. 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 Patient underwent T5-T7 lam for resection and debulking of tumor on 11/11/12 by Dr. Jordan Likes and initially placed on bed rest. Maintained on subcutaneous Lovenox for DVT prophylaxis. Decadron protocol ongoing as per neurosurgery and presently on 4 mg every 6 hours and plan  slow taper. Close monitoring of blood sugars while on steroids. Motor exam with slow return but patient reporting some improvement in sensation RLE. Physical occupational therapy evaluations completed an ongoing. Recommendations were made for physical medicine rehabilitation consultation consider inpatient rehabilitation services. Patient was admitted for comprehensive rehabilitation program. Patient transferred to CIR on 11/18/2012 .    Patient currently requires total with basic self-care skills and IADL secondary to muscle weakness, muscle joint tightness and muscle paralysis and decreased sitting balance, decreased postural control and decreased balance strategies.  Prior to hospitalization, patient could complete ADLs and IADLs using cane.  Patient will benefit from skilled intervention to increase independence with basic self-care skills prior to discharge home with fiance and two kids (68 year old girl and 65 year old boy).  Anticipate patient will require 24 hour supervision and follow up home health.  OT - End of Session Activity Tolerance: Tolerates 10 - 20 min activity with multiple rests Endurance Deficit: Yes OT Assessment Rehab Potential: Excellent Barriers to Discharge: None (none known at this time) OT Plan OT Intensity: Minimum of 1-2 x/day, 45 to 90 minutes OT Frequency: 5 out of 7 days OT Duration/Estimated Length of Stay: 2-2.5 weeks OT Treatment/Interventions: Balance/vestibular training;Community reintegration;Discharge planning;DME/adaptive equipment instruction;Functional mobility training;Neuromuscular re-education;Pain management;Patient/family education;Psychosocial support;Self Care/advanced ADL retraining;Skin care/wound managment;Splinting/orthotics;Therapeutic Activities;Therapeutic Exercise;UE/LE Strength taining/ROM;UE/LE Coordination activities;Wheelchair propulsion/positioning OT Recommendation Follow Up Recommendations: Home health OT Equipment Recommended:  Wheelchair (measurements);Wheelchair cushion (measurements);Sliding board  Precautions/Restrictions  Precautions Precautions: Fall Precaution Comments: paraplegia Restrictions Weight Bearing Restrictions: No  General Chart Reviewed: Yes Family/Caregiver Present: No  Pain Pain Assessment Pain Assessment: 0-10 Pain Score:   2 Pain Type: Surgical pain Pain Location: Back Pain Orientation: Upper;Mid Pain Descriptors: Tightness Pain Intervention(s): Other (Comment) (premedicated) Multiple Pain Sites: No  Home Living/Prior Functioning Home Living Lives With: Significant other;Son;Daughter (kids are 39 and 32 yo) Available Help at Discharge: Family;Available PRN/intermittently Type of Home: Mobile home Home Access: Stairs to enter Entrance Stairs-Number of Steps: 2 Entrance Stairs-Rails: None Home Layout: One level Bathroom Shower/Tub: Forensic scientist: Standard Bathroom Accessibility: No Home Adaptive Equipment: Walker - rolling;Straight cane;Quad cane Additional Comments: discussed having a ramp built for home access with w/c IADL History Homemaking Responsibilities: Yes Meal Prep Responsibility: Primary Laundry Responsibility: Primary Cleaning Responsibility: Primary Bill Paying/Finance Responsibility: Primary Shopping Responsibility: Primary Child Care Responsibility: Primary Current License: Yes 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 Prior Function Level of Independence: Independent with basic ADLs;Independent with homemaking with ambulation;Independent with gait;Independent with transfers Able to Take Stairs?: Reciprically Driving: Yes Vocation: Unemployed  ADL - See FIM  Vision/Perception  Vision - History Baseline Vision: No visual deficits Patient Visual Report: No change from baseline Vision - Assessment Eye Alignment: Within Functional Limits Vision Assessment: Vision not tested Perception Perception: Within Functional  Limits Praxis Praxis: Intact   Cognition Overall Cognitive Status: Appears within functional limits for tasks assessed Arousal/Alertness: Awake/alert Orientation Level: Oriented X4 Memory: Appears intact Awareness: Appears intact Problem Solving: Appears intact Safety/Judgment: Appears intact  Sensation Sensation Light Touch: Impaired Detail Light Touch Impaired Details: Impaired RLE;Impaired LLE Stereognosis: Appears Intact (BUEs) Hot/Cold: Appears Intact (BUEs) Proprioception: Impaired Detail Proprioception Impaired Details: Impaired RLE;Impaired LLE Coordination Gross Motor Movements are Fluid and Coordinated: No (LE's) Fine Motor Movements are Fluid and Coordinated: Yes (BUEs)  Motor  Motor Motor: Abnormal tone;Paraplegia;Abnormal postural alignment and control  Trunk/Postural Assessment  Cervical Assessment Cervical Assessment: Within Functional Limits Thoracic Assessment Thoracic Assessment: Exceptions to [facility] (pain due to sx; no noted precautions) Lumbar Assessment Lumbar Assessment: Within Functional Limits Postural Control Postural Control: Deficits on evaluation Trunk Control: decreased sitting balance; tends to lose balance posterior   Balance Balance Balance Assessed: Yes Static Sitting Balance Static Sitting - Level of Assistance: 5: Stand by assistance;4: Min assist Dynamic Sitting Balance Dynamic Sitting - Level of Assistance: 3: Mod assist  Extremity/Trunk Assessment RUE Assessment RUE Assessment: Within Functional Limits (can benefit from UE strengthening) LUE Assessment LUE Assessment: Within Functional Limits (can benefit from UE strengthening)  See FIM for current functional status  Refer to Care Plan for Long Term Goals  Recommendations for other services: None  Discharge Criteria: Patient will be discharged from OT if patient refuses treatment 3 consecutive times without medical reason, if treatment goals not met, if there is a change  in medical status, if patient makes no progress towards goals or if patient is discharged from hospital.  The above assessment, treatment plan, treatment alternatives and goals were discussed and mutually agreed upon: by patient  ---------------------------------------------------------------------------------------------------------  SESSION NOTE  [id number] - 60 Minutes Individual Therapy Patient with 8/10 complaints of pain in mid back; RN made aware and administered pain medication Initial 1:1 occupational therapy evaluation completed. Focused skilled intervention on bed mobility, UB/LB bathing & dressing, overall activity tolerance/endurance, and dynamic sitting balance/tolerance/endurance. Patient is very motivated to work hard and be as independent as possible. At end of session left patient supine in bed with call bell & phone within reach.   Maesyn Frisinger 11/19/2012, 12:02 PM

## 2012-11-19 NOTE — Progress Notes (Signed)
Physical Therapy Session Note  Patient Details  Name: Debbie [name] MRN: [id number] Date of Birth: 1976-11-29  Today's Date: 11/19/2012 Time: 1100-1150 and 1515-1540 Time Calculation (min): 50 min and 25 min  Short Term Goals: Week 1:  PT Short Term Goal 1 (Week 1): Pt will be able to demonstrate dynamic sitting balance with min A during functional tasks PT Short Term Goal 2 (Week 1): Pt will be able to perform bed mobility with min A PT Short Term Goal 3 (Week 1): Pt will be able to demonstrate pressure relief techniques with S when OOB in w/c PT Short Term Goal 4 (Week 1): Pt will be able to mange w/c parts for preparation for transfers with S  Skilled Therapeutic Interventions/Progress Updates:  Patient reporting some pain/tightness in incision area and asking about stretches for her back; educated patient on risk of disrupting the incision with stretching this acutely; educated patient on possible stretches for bilat LE to maintain ROM for transfers and bed mobility; patient performed w/c mobility on unit x 200' with supervision with verbal and visual cues for setup of w/c and parts management for transfers.  Patient performed slideboard transfer w/c > mat with max A to maintain trunk control and balance during lateral and anterior leans; patient tends to lean backwards secondary to fear of falling forwards.  On mat patient performed seated dynamic sitting balance training with feet supported and UE supported on tall rolling table with use of anterior and posterior and lateral leans to push table forwards and backwards to find limits of stability; transitioned to one UE on table and one elbow to thigh progressing to bilat UE on thigh for a short amount of time; patient very anxious with forward leaning.  Educated patient on importance of finding and working within limits of stability to progress independence with transfers but also to be aware of limitations when reaching out of BOS and use of  UE compensation to minimize fall risk.  Also educated patient on position of knees to elbows for pressure relief in the w/c.  Transitioned to dynamic sitting balance with feet support but bilat UE performing ball toss in straight and transverse planes with min-mod A to maintain and correct balance.  Donned bilat leg loops and educated patient on how to use leg loops to assist with progressing each LE during slide board transfers; performed slideboard back to w/c with leg loops and mod-max A for LE management and maintain balance with forward leans and use of leg loops to reposition LE onto leg rests.   PM session: Patient reporting no pressure or pain in buttocks and reports that she has been performing pressure relief all day in w/c.  Patient also reports practicing leaning forward to place elbows/forearms on thighs and is now able to do independently in w/c.  Patient set up in standing frame; vitals assessed in sitting: 126/75, HR: 96.  Patient raised to standing with total A standing frame and stood for 1-2 minutes with good postural control in standing and faint lightheadedness.  Vitals assessed: 94/69, HR: 121; patient returned to sitting and allow to rest and re-assessed vitals: 146/84, HR:87.  Patient may require THT to perform standing in the future.  For UE strengthening and endurance performed w/c mobility x 200' over tile and carpet with extra resistance on back of w/c.    Therapy Documentation Precautions:  Precautions Precautions: Fall Precaution Comments: paraplegia Restrictions Weight Bearing Restrictions: No Pain: Pain Assessment Pain Assessment: 0-10 Pain Score:  2 Pain Type: Surgical pain Pain Location: Back Pain Orientation: Upper;Mid Pain Descriptors: Tightness Pain Intervention(s): Other (Comment) (premedicated) Multiple Pain Sites: No Locomotion : Naval architect Assistance: 5: Supervision Distance: 200   See FIM for current functional  status  Therapy/Group: Individual Therapy  Edman Circle [facility] 11/19/2012, 11:55 AM

## 2012-11-19 NOTE — Progress Notes (Signed)
Subjective/Complaints: Good night. Excited to begin therapies. Denies pain. A 12 point review of systems has been performed and if not noted above is otherwise negative.   Objective: Vital Signs: Blood pressure 132/84, pulse 57, temperature 97.8 F (36.6 C), temperature source Oral, resp. rate 19, height 5\' 8"  (1.727 m), weight 103.7 kg (228 lb 9.9 oz), last menstrual period 11/07/2012, SpO2 99.00%. No results found.  Basename 11/19/12 0635  WBC 23.0*  HGB 13.2  HCT 40.0  PLT 260    Basename 11/18/12 0500  NA 134*  K 3.9  CL 99  GLUCOSE 114*  BUN 18  CREATININE 0.56  CALCIUM 9.0   CBG (last 3)   Basename 11/19/12 0726 11/18/12 1132 11/18/12 0715  GLUCAP 91 108* 117*    Wt Readings from Last 3 Encounters:  11/18/12 103.7 kg (228 lb 9.9 oz)  11/11/12 95.9 kg (211 lb 6.7 oz)  11/11/12 95.9 kg (211 lb 6.7 oz)    Physical Exam:  Nursing note and vitals reviewed.  Constitutional: She is oriented to person, place, and time. She appears well-developed and well-nourished. obese  Eyes: Pupils are equal, round, and reactive to light.  Neck: Normal range of motion.  Cardiovascular: Normal rate and regular rhythm.  Pulmonary/Chest: Effort normal and breath sounds normal.  Abdominal: Soft. Bowel sounds are normal.  Neurological: She is alert and oriented to person, place, and time. She displays abnormal reflex. No cranial nerve deficit. Coordination abnormal.  Skin: Skin is warm and dry. Wound intact with sutures. No dressing.  Motor: 5/5 in bilateral deltoid, biceps, triceps and grip  1+ knee and hip extensor synergy in the right lower extremity proximally, at the ankle she was 1-2/5 with ADF and APF. LLE is trace proximally with 0/5 distally. She did have general sense to touch but could not differentiate pain/light touch, temp, etc.  Sensation diminished below the xiphoid process    Assessment/Plan: 1. Functional deficits secondary to T5-6 intradural mass /sp T5-7  lami/resection which require 3+ hours per day of interdisciplinary therapy in a comprehensive inpatient rehab setting. Physiatrist is providing close team supervision and 24 hour management of active medical problems listed below. Physiatrist and rehab team continue to assess barriers to discharge/monitor patient progress toward functional and medical goals. FIM:                   Comprehension Comprehension Mode: Auditory Comprehension: 5-Understands basic 90% of the time/requires cueing < 10% of the time  Expression Expression Mode: Verbal Expression: 5-Expresses basic 90% of the time/requires cueing < 10% of the time.  Social Interaction Social Interaction: 5-Interacts appropriately 90% of the time - Needs monitoring or encouragement for participation or interaction.  Problem Solving Problem Solving: 5-Solves basic 90% of the time/requires cueing < 10% of the time  Memory Memory: 5-Requires cues to use assistive device Medical Problem List and Plan:  1. 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 Status post T5-T7 laminectomy for resection and debulking of tumor 11/11/2012  2. DVT Prophylaxis/Anticoagulation: Subcutaneous Lovenox. Monitor platelet counts any signs of bleeding  3. Pain Management: Percocet as needed and Naprosyn. Lidoderm patch every 12 hours  4. Hypertension. Hydrochlorothiazide 12.5 mg daily, lisinopril 40 mg daily. Monitor with increased mobility  5. Neuropsych: This patient is capable of making decisions on his/her own behalf.  6. Tobacco abuse. NicoDerm patch. Provide counseling    LOS (Days) 1 A FACE TO FACE EVALUATION WAS PERFORMED  Debbie [name] 11/19/2012  7:37 AM

## 2012-11-19 NOTE — Progress Notes (Signed)
Patient information reviewed and entered into eRehab system by Mohanad Carsten, RN, CRRN, PPS Coordinator.  Information including medical coding and functional independence measure will be reviewed and updated through discharge.    

## 2012-11-20 ENCOUNTER — Inpatient Hospital Stay (HOSPITAL_COMMUNITY): Payer: Medicaid Other | Admitting: Occupational Therapy

## 2012-11-20 ENCOUNTER — Encounter (HOSPITAL_COMMUNITY): Payer: Medicaid Other

## 2012-11-20 ENCOUNTER — Inpatient Hospital Stay (HOSPITAL_COMMUNITY): Payer: Medicaid Other

## 2012-11-20 DIAGNOSIS — G8220 Paraplegia, unspecified: Secondary | ICD-10-CM

## 2012-11-20 LAB — GLUCOSE, CAPILLARY
Glucose-Capillary: 93 mg/dL (ref 70–99)
Glucose-Capillary: 89 mg/dL (ref 70–99)

## 2012-11-20 MED ORDER — METHOCARBAMOL 500 MG PO TABS
500.0000 mg | ORAL_TABLET | Freq: Four times a day (QID) | ORAL | Status: DC
  Administered 2012-11-20 – 2012-11-21 (×8): 500 mg via ORAL
  Filled 2012-11-20 (×14): qty 1

## 2012-11-20 NOTE — Progress Notes (Signed)
Physical Therapy Session Note  Patient Details  Name: Debbie [name] MRN: [id number] Date of Birth: October 25, 1976  Today's Date: 11/20/2012 Time: 4098-1191 Time Calculation (min): 45 min  Short Term Goals: Week 1:  PT Short Term Goal 1 (Week 1): Pt will be able to demonstrate dynamic sitting balance with min A during functional tasks PT Short Term Goal 2 (Week 1): Pt will be able to perform bed mobility with min A PT Short Term Goal 3 (Week 1): Pt will be able to demonstrate pressure relief techniques with S when OOB in w/c PT Short Term Goal 4 (Week 1): Pt will be able to mange w/c parts for preparation for transfers with S  Skilled Therapeutic Interventions/Progress Updates:    Reports pain near surgical site (recommendations and education reviewed for mobility to decrease twisting and added pressure in area) but premedicated. Focused session on trunk neuro re-education, weightshifting, scooting edge of mat to work on head/hips relationship and dynamic balance. Emphasis on pt managing LE's using leg loops and setting up as well as managing w/c parts for transfer. Required mod/max A for slide board transfer both directions. W/c mobility S on unit for endurance and strengthening.   Therapy Documentation Precautions:  Precautions Precautions: Fall Precaution Comments: paraplegia Restrictions Weight Bearing Restrictions: No    See FIM for current functional status  Therapy/Group: Individual Therapy  Karolee Stamps [facility] 11/20/2012, 11:43 AM

## 2012-11-20 NOTE — Progress Notes (Signed)
Subjective/Complaints: Woke up early this AM with pain at the bottom of right shoulder blade (when she turned in bed) A 12 point review of systems has been performed and if not noted above is otherwise negative.   Objective: Vital Signs: Blood pressure 143/80, pulse 62, temperature 98 F (36.7 C), temperature source Oral, resp. rate 19, height 5\' 8"  (1.727 m), weight 103.7 kg (228 lb 9.9 oz), last menstrual period 11/07/2012, SpO2 100.00%. No results found.  Basename 11/19/12 0635  WBC 23.0*  HGB 13.2  HCT 40.0  PLT 260    Basename 11/19/12 0635 11/18/12 0500  NA 137 134*  K 3.9 3.9  CL 98 99  GLUCOSE 90 114*  BUN 19 18  CREATININE 0.61 0.56  CALCIUM 9.1 9.0   CBG (last 3)   Basename 11/20/12 0730 11/19/12 2118 11/19/12 1657  GLUCAP 93 150* 96    Wt Readings from Last 3 Encounters:  11/18/12 103.7 kg (228 lb 9.9 oz)  11/11/12 95.9 kg (211 lb 6.7 oz)  11/11/12 95.9 kg (211 lb 6.7 oz)    Physical Exam:  Nursing note and vitals reviewed.  Constitutional: She is oriented to person, place, and time. She appears well-developed and well-nourished. obese  Eyes: Pupils are equal, round, and reactive to light.  Neck: Normal range of motion.  Cardiovascular: Normal rate and regular rhythm.  Pulmonary/Chest: Effort normal and breath sounds normal.  Abdominal: Soft. Bowel sounds are normal.  Neurological: She is alert and oriented to person, place, and time. She displays abnormal reflex. No cranial nerve deficit. Coordination abnormal.  Skin: Skin is warm and dry. Wound intact with sutures. No dressing.  Motor: 5/5 in bilateral deltoid, biceps, triceps and grip  1+ knee and hip extensor synergy in the right lower extremity proximally, at the ankle she was 1-2/5 with ADF and APF. LLE is trace proximally with 0/5 distally. She did have general sense to touch but could not differentiate pain/light touch, temp, etc.  Sensation diminished below the xiphoid process  Musc: TP, pain  along inferior border of scapula   Assessment/Plan: 1. Functional deficits secondary to T5-6 intradural mass /sp T5-7 lami/resection which require 3+ hours per day of interdisciplinary therapy in a comprehensive inpatient rehab setting. Physiatrist is providing close team supervision and 24 hour management of active medical problems listed below. Physiatrist and rehab team continue to assess barriers to discharge/monitor patient progress toward functional and medical goals. FIM: FIM - Bathing Bathing: 1: Total-Patient completes 0-2 of 10 parts or less than 25%  FIM - Upper Body Dressing/Undressing Upper body dressing/undressing steps patient completed: Thread/unthread right sleeve of pullover shirt/dresss;Thread/unthread left sleeve of pullover shirt/dress;Put head through opening of pull over shirt/dress;Pull shirt over trunk Upper body dressing/undressing: 4: Steadying assist (in supine position with HOB raised) FIM - Lower Body Dressing/Undressing Lower body dressing/undressing: 1: Total-Patient completed less than 25% of tasks  FIM - Toileting Toileting: 0: Activity did not occur  FIM - Archivist Transfers: 0-Activity did not occur  FIM - Banker Devices: Sliding board Bed/Chair Transfer: 2: Bed > Chair or W/C: Max A (lift and lower assist);2: Chair or W/C > Bed: Max A (lift and lower assist)  FIM - Locomotion: Wheelchair Distance: 200 Locomotion: Wheelchair: 5: Travels 150 ft or more: maneuvers on rugs and over door sills with supervision, cueing or coaxing FIM - Locomotion: Ambulation Locomotion: Ambulation: 0: Activity did not occur  Comprehension Comprehension Mode: Auditory Comprehension: 6-Follows complex conversation/direction: With extra  time/assistive device  Expression Expression Mode: Verbal Expression: 6-Expresses complex ideas: With extra time/assistive device  Social Interaction Social Interaction:  7-Interacts appropriately with others - No medications needed.  Problem Solving Problem Solving: 7-Solves complex problems: Recognizes & self-corrects  Memory Memory: 6-More than reasonable amt of time   Medical Problem List and Plan:  1. 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 Status post T5-T7 laminectomy for resection and debulking of tumor 11/11/2012  2. DVT Prophylaxis/Anticoagulation: Subcutaneous Lovenox. Monitor platelet counts any signs of bleeding  3. Pain Management: Percocet as needed and Naprosyn. Lidoderm patch every 12 hours   -Heat, robaxin, ROM. Consider TPI if refractory 4. Hypertension. Hydrochlorothiazide 12.5 mg daily, lisinopril 40 mg daily. Monitor with increased mobility  5. Neuropsych: This patient is capable of making decisions on his/her own behalf.  6. Tobacco abuse. NicoDerm patch. Provide counseling    LOS (Days) 2 A FACE TO FACE EVALUATION WAS PERFORMED  Edelmiro Innocent T 11/20/2012 7:55 AM

## 2012-11-20 NOTE — Progress Notes (Signed)
Occupational Therapy Session Notes  Patient Details  Name: Debbie [name] MRN: [id number] Date of Birth: July 29, 1976  Today's Date: 11/20/2012  Short Term Goals: Week 1:  OT Short Term Goal 1 (Week 1): Patient will bathe 10/10 body parts with minimal assistance OT Short Term Goal 2 (Week 1): Patient will donn shirt sitting edge of bed with minimal assistance OT Short Term Goal 3 (Week 1): Patient will perform LB dressing with moderate assistance OT Short Term Goal 4 (Week 1): Patient will perform toilet transfer onto drop arm BSC using slide board prn with moderate assistance  Skilled Therapeutic Interventions/Progress Updates:   Session #1 4098-1191 - 60 Minutes Individual Therapy No complaints of pain except during movement of right shoulder around scapula; RN & MD aware Patient found supine in bed. Patient engaged in bed mobility for UB/LB bathing & dressing at bed level. Focused skilled intervention on side rolling left <-> right for bathing & dressing, education on safety & effectiveness of bed mobility in order to complete ADL, LE management, circle sitting, positioning in bed, donning of bilateral leg loops, pain management, overall activity tolerance/endurance, supine -> sit, edge of bed -> w/c slide board transfer, and positioning in w/c. At end of session left patient seated in w/c to maneuver around room prn.   Session #2 1345-1430 - 45 Minutes Individual Therapy No complaint of pain Patient found supine in bed on KPad machine-> right scapula area. Patient engaged in bed mobility and transferred edge of bed -> w/c using slide board with minimal assistance without use of leg loops. Patient then positioned self in w/c and managed leg rests on w/c. Patient required min verbal cues to lock breaks for safety during w/c positioning and management. Patient propelled self -> therapy gym for therapeutic activity seated edge of mat focusing on lateral leans and dynamic sitting  balance/tolerance/endurance. Also focused on transfers and w/c positioning in therapy gym. At end of session left patient seated in w/c to maneuver around room and unit prn.   Precautions:  Precautions Precautions: Fall Precaution Comments: paraplegia Restrictions Weight Bearing Restrictions: No  See FIM for current functional status  Fraya Ueda 11/20/2012, 7:33 AM

## 2012-11-20 NOTE — Progress Notes (Signed)
Occupational Therapy Session Note  Patient Details  Name: Debbie [name] MRN: [id number] Date of Birth: Aug 10, 1976  Today's Date: 11/20/2012 Time: 1030-1110 Time Calculation (min): 40 min  Short Term Goals: Week 1:  OT Short Term Goal 1 (Week 1): Patient will bathe 10/10 body parts with minimal assistance OT Short Term Goal 2 (Week 1): Patient will donn shirt sitting edge of bed with minimal assistance OT Short Term Goal 3 (Week 1): Patient will perform LB dressing with moderate assistance OT Short Term Goal 4 (Week 1): Patient will perform toilet transfer onto drop arm BSC using slide board prn with moderate assistance  Skilled Therapeutic Interventions/Progress Updates:  Patient up in w/c upon arrival.  Engaged in slide board transfers w/c><drop arm commode via slide board, techniques for ease to perform toileting tasks.  Focused session on demonstration then return demonstration of safe techniques for transfer, optimal placement of all equipment, lateral leans not able to fully perform due to pain on right side of upper back.  Patient very motivated and asking great questions regarding her recovery and safety to perform all BADL tasks.  Patient decided that she needed to have a BM once we were on the commode so patient performed lateral leans while this clinician pulled down her pants.  NT aware that patient on [facility] and will call when ready.  Therapy Documentation Precautions:  Precautions Precautions: Fall Precaution Comments: paraplegia Restrictions Weight Bearing Restrictions: No Pain: Left shoulder pain, not rated, premedicated, therapy session accommodated for pain. See FIM for current functional status  Therapy/Group: Individual Therapy  Gorden Stthomas 11/20/2012, 3:46 PM

## 2012-11-20 NOTE — Patient Care Conference (Signed)
Inpatient RehabilitationTeam Conference Note Date: 11/19/2012   Time: 2:25 PM    Patient Name: Debbie [name]      Medical Record Number: [id number]  Date of Birth: [birth year] Sex: Female         Room/Bed: 4028/4028-01 Payor Info: Payor: MEDICAID [place]  Plan: MEDICAID  ACCESS  Product Type: *No Product type*     Admitting Diagnosis: T7 Lami and Resection  Admit Date/Time:  11/18/2012  2:11 PM Admission Comments: No comment available   Primary Diagnosis:  Paraplegia Principal Problem: Paraplegia  Patient Active Problem List   Diagnosis Date Noted  . Paraplegia 11/18/2012  . 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 Spinal cord compression 11/09/2012  . Lower extremity weakness 11/08/2012  . Hypokalemia 11/08/2012  . HTN (hypertension) 11/08/2012  . Myelopathy 11/08/2012  . Paraparesis of both lower limbs 11/08/2012    Expected Discharge Date: Expected Discharge Date: 12/06/12  Team Members Present: Physician leading conference: Dr. Faith Rogue Social Worker Present: Amada Jupiter, LCSW Nurse Present: Daryll Brod, RN PT Present: Karolee Stamps, PT;Other (comment) Corinna Capra., PT) OT Present: Edwin Cap, [name] Chance, OT Other (Discipline and Name): Ottie Glazier, RN     Current Status/Progress Goal Weekly Team Focus  Medical   thoracic tumor s/p decompression  maximize neurological return, control pain  bowel and bladder mgt, pain control   Bowel/Bladder   incontinent of bowel/ had a foley catheter  continent of bowel / no uti  continent of bowel and bladder   Swallow/Nutrition/ Hydration             ADL's   overall total assist  overall set-up  ADL retraining, circle sitting, LE management, UE strengthening, pain management, activity tolerance/endurance   Mobility   max A transfers; S w/c mobility; min/mod A sitting balance  mod I w/c level  transfer training, sitting balance/trunk control, LE management, activity tolerance, w/c  mobility and parts management   Communication             Safety/Cognition/ Behavioral Observations            Pain   pain level at 5/ Acetaminophen 325-650 mg PRN, Norco 5-325 mg PRN  less or equal to 3  less or equal to 3   Skin   suture in back from T5- T7 laminectomy. Rest of skin ok  free of skin breakdown  free of skin breakdown    Rehab Goals Patient on target to meet rehab goals: Yes *See Interdisciplinary Assessment and Plan and progress notes for long and short-term goals  Barriers to Discharge: neuro deficits    Possible Resolutions to Barriers:  pt educationa, adaptive techniques.    Discharge Planning/Teaching Needs:  Home with fiance and other family members to provide 24/7 assistance if needed.  Landlord agreeable to  build ramp if needed.      Team Discussion:  Very motivated young woman and expect good progress.  Hopeful for some return in LE and increased control over b/b function. Just starting program.  No concerns at this time.  Revisions to Treatment Plan:  None   Continued Need for Acute Rehabilitation Level of Care: The patient requires daily medical management by a physician with specialized training in physical medicine and rehabilitation for the following conditions: Daily direction of a multidisciplinary physical rehabilitation program to ensure safe treatment while eliciting the highest outcome that is of practical value to the patient.: Yes Daily medical management of patient stability for increased activity during participation  in an intensive rehabilitation regime.: Yes Daily analysis of laboratory values and/or radiology reports with any subsequent need for medication adjustment of medical intervention for : Post surgical problems;Neurological problems;Other  Debbie [name] 11/20/2012, 3:05 PM

## 2012-11-21 ENCOUNTER — Inpatient Hospital Stay (HOSPITAL_COMMUNITY): Payer: Medicaid Other | Admitting: Occupational Therapy

## 2012-11-21 ENCOUNTER — Inpatient Hospital Stay (HOSPITAL_COMMUNITY): Payer: Medicaid Other

## 2012-11-21 LAB — GLUCOSE, CAPILLARY
Glucose-Capillary: 131 mg/dL — ABNORMAL HIGH (ref 70–99)
Glucose-Capillary: 94 mg/dL (ref 70–99)
Glucose-Capillary: 131 mg/dL — ABNORMAL HIGH (ref 70–99)

## 2012-11-21 NOTE — Progress Notes (Signed)
Social Work Patient ID: Debbie [name], female   DOB: 14-Oct-1976, 36 y.o.   MRN: [id number]  Met with patient after team conference to review targeted d/c date of 12/06/12 and modified independent to supervision w/c level goals.  Pt very pleased with progress in first few days on CIR.  Remains very optimistic.  No concerns at this point.  Taralee Marcus, LCSW

## 2012-11-21 NOTE — Plan of Care (Signed)
Problem: RH SKIN INTEGRITY Goal: RH STG SKIN FREE OF INFECTION/BREAKDOWN Skin free of breakdown/infection with minimal assistance  Outcome: Progressing No additional skin breakdown

## 2012-11-21 NOTE — Progress Notes (Signed)
Inpatient Rehabilitation Center Individual Statement of Services  Patient Name:  Debbie [name]  Date:  11/21/2012  Welcome to the Inpatient Rehabilitation Center.  Our goal is to provide you with an individualized program based on your diagnosis and situation, designed to meet your specific needs.  With this comprehensive rehabilitation program, you will be expected to participate in at least 3 hours of rehabilitation therapies Monday-Friday, with modified therapy programming on the weekends.  Your rehabilitation program will include the following services:  Physical Therapy (PT), Occupational Therapy (OT), 24 hour per day rehabilitation nursing, Therapeutic Recreaction (TR), Neuropsychology, Case Management ( Social Worker), Rehabilitation Medicine, Nutrition Services and Pharmacy Services  Weekly team conferences will be held on Tuesdays to discuss your progress.  Your  Social Worker will talk with you frequently to get your input and to update you on team discussions.  Team conferences with you and your family in attendance may also be held.  Expected length of stay: 2-3 weeks  Overall anticipated outcome: modified independent to supervision                                                                                                                        (wheelchair level)  Depending on your progress and recovery, your program may change.  Your  Social Worker will coordinate services and will keep you informed of any changes.  Your  Social Worker's name and contact numbers are listed  below.  The following services may also be recommended but are not provided by the Inpatient Rehabilitation Center:   Driving Evaluations  Home Health Rehabiltiation Services  Outpatient Rehabilitatation [facility]  Vocational Rehabilitation   Arrangements will be made to provide these services after discharge if needed.  Arrangements include referral to agencies that provide these services.  Your  insurance has been verified to be:  Medicaid Your primary doctor is:  [facility]. Health Dept  Pertinent information will be shared with your doctor and your insurance company.  Social Worker:  [place], [place] [phone] or ([phone]  Information discussed with and copy given to patient by: Debbie [name], 11/21/2012, 9:27 AM

## 2012-11-21 NOTE — Progress Notes (Signed)
Physical Therapy Session Note  Patient Details  Name: Debbie [name] MRN: [id number] Date of Birth: August 13, 1976  Today's Date: 11/21/2012 Time: 0800-0830 Time Calculation (min): 30 min  Short Term Goals: Week 1:  PT Short Term Goal 1 (Week 1): Pt will be able to demonstrate dynamic sitting balance with min A during functional tasks PT Short Term Goal 2 (Week 1): Pt will be able to perform bed mobility with min A PT Short Term Goal 3 (Week 1): Pt will be able to demonstrate pressure relief techniques with S when OOB in w/c PT Short Term Goal 4 (Week 1): Pt will be able to mange w/c parts for preparation for transfers with S  Skilled Therapeutic Interventions/Progress Updates:    Focused session on PROM/stretching and self stretching for bilateral LE's to aid with flexibility and functional mobility and prevent contractures for ankle, hip, and knee ROM x 10 reps each with stretches for 30 second hold.   Therapy Documentation Precautions:  Precautions Precautions: Fall Precaution Comments: paraplegia Restrictions Weight Bearing Restrictions: No  Pain:  reports stiffness and soreness near incision site. Premedicated.   See FIM for current functional status  Therapy/Group: Individual Therapy  Karolee Stamps [facility] 11/21/2012, 8:45 AM

## 2012-11-21 NOTE — Progress Notes (Signed)
Occupational Therapy Session Notes  Patient Details  Name: Debbie [name] MRN: [id number] Date of Birth: 02/17/1976  Today's Date: 11/21/2012  Short Term Goals: Week 1:  OT Short Term Goal 1 (Week 1): Patient will bathe 10/10 body parts with minimal assistance OT Short Term Goal 2 (Week 1): Patient will donn shirt sitting edge of bed with minimal assistance OT Short Term Goal 3 (Week 1): Patient will perform LB dressing with moderate assistance OT Short Term Goal 4 (Week 1): Patient will perform toilet transfer onto drop arm BSC using slide board prn with moderate assistance  Skilled Therapeutic Interventions/Progress Updates:   Session #1 [id number] - 55 Minutes Individual Therapy No complaints of pain except during some movements of right scapula.  Patient found supine in bed. Engaged in bed mobility for UB/LB bathing & dressing, patient able to perform at an overall supervision/set-up -> steady assist level. LB ADLs & UB bathing performed in supine with HOB up/down & UB dressing performed seated edge of bed. Focused skilled intervention on overall activity tolerance/endurance, education regarding compensatory strategies to increase independence, education regarding safety and effectiveness of performing ADL at bed level, bed mobility (side rolling and supine -> sit), edge of bed -> w/c slide board transfer, w/c positioning, and w/c management. Patient left seated in w/c to maneuver around room prn.   Session #2 Patient missed 30 minutes of skilled occupational therapy secondary to nursing care; catheterization. Currently, patient is unable to empty bladder when voiding.   Precautions:  Precautions Precautions: Fall Precaution Comments: paraplegia Restrictions Weight Bearing Restrictions: No  See FIM for current functional status  [name] 11/21/2012, 7:30 AM

## 2012-11-21 NOTE — Progress Notes (Signed)
Subjective/Complaints: Back feels better. Worked hard in therapies yesterday. A 12 point review of systems has been performed and if not noted above is otherwise negative.   Objective: Vital Signs: Blood pressure 134/77, pulse 62, temperature 98 F (36.7 C), temperature source Oral, resp. rate 20, height 5\' 8"  (1.727 m), weight 100.1 kg (220 lb 10.9 oz), last menstrual period 11/07/2012, SpO2 99.00%. No results found.  Basename 11/19/12 0635  WBC 23.0*  HGB 13.2  HCT 40.0  PLT 260    Basename 11/19/12 0635  NA 137  K 3.9  CL 98  GLUCOSE 90  BUN 19  CREATININE 0.61  CALCIUM 9.1   CBG (last 3)   Basename 11/21/12 0732 11/20/12 2104 11/20/12 1641  GLUCAP 100* 123* 115*    Wt Readings from Last 3 Encounters:  11/20/12 100.1 kg (220 lb 10.9 oz)  11/11/12 95.9 kg (211 lb 6.7 oz)  11/11/12 95.9 kg (211 lb 6.7 oz)    Physical Exam:  Nursing note and vitals reviewed.  Constitutional: She is oriented to person, place, and time. She appears well-developed and well-nourished. obese  Eyes: Pupils are equal, round, and reactive to light.  Neck: Normal range of motion.  Cardiovascular: Normal rate and regular rhythm.  Pulmonary/Chest: Effort normal and breath sounds normal.  Abdominal: Soft. Bowel sounds are normal.  Neurological: She is alert and oriented to person, place, and time. She displays abnormal reflex. No cranial nerve deficit. Coordination abnormal.  Skin: Skin is warm and dry. Wound intact with sutures. No dressing.  Motor: 5/5 in bilateral deltoid, biceps, triceps and grip  1+ knee and hip extensor synergy in the right lower extremity proximally, at the ankle she was 1-2/5 with ADF and APF. LLE is trace proximally with 0/5 distally. She did have general sense to touch but could not differentiate pain/light touch, temp, etc.  Sensation diminished below the xiphoid process  Musc: TP, pain along inferior border of scapula   Assessment/Plan: 1. Functional deficits  secondary to T5-6 intradural mass /sp T5-7 lami/resection which require 3+ hours per day of interdisciplinary therapy in a comprehensive inpatient rehab setting. Physiatrist is providing close team supervision and 24 hour management of active medical problems listed below. Physiatrist and rehab team continue to assess barriers to discharge/monitor patient progress toward functional and medical goals. FIM: FIM - Bathing Bathing Steps Patient Completed: Chest;Right Arm;Left Arm;Abdomen;Front perineal area;Buttocks;Right upper leg;Left upper leg;Right lower leg (including foot);Left lower leg (including foot) Bathing: 4: Steadying assist  FIM - Upper Body Dressing/Undressing Upper body dressing/undressing steps patient completed: Thread/unthread right sleeve of pullover shirt/dresss;Thread/unthread left sleeve of pullover shirt/dress;Put head through opening of pull over shirt/dress;Pull shirt over trunk Upper body dressing/undressing: 4: Steadying assist (supine in bed) FIM - Lower Body Dressing/Undressing Lower body dressing/undressing steps patient completed: Thread/unthread right pants leg;Thread/unthread left pants leg;Pull pants up/down;Don/Doff right sock;Don/Doff left sock Lower body dressing/undressing: 3: Mod-Patient completed 50-74% of tasks  FIM - Toileting Toileting steps completed by patient: Performs perineal hygiene Toileting: 1: Two helpers (slideboard transfer)  FIM - Diplomatic Services operational officer Devices: Sliding board;Grab bars Toilet Transfers: 1-Two helpers  FIM - Architectural technologist Transfer: 1: Two helpers  FIM - Locomotion: Wheelchair Distance: 200 Locomotion: Wheelchair: 5: Travels 150 ft or more: maneuvers on rugs and over door sills with supervision, cueing or coaxing FIM - Locomotion: Ambulation Locomotion: Ambulation: 0: Activity did not occur  Comprehension Comprehension Mode:  Auditory Comprehension: 7-Follows complex conversation/direction: With  no assist  Expression Expression Mode: Verbal Expression: 7-Expresses complex ideas: With no assist  Social Interaction Social Interaction: 7-Interacts appropriately with others - No medications needed.  Problem Solving Problem Solving: 7-Solves complex problems: Recognizes & self-corrects  Memory Memory: 7-Complete Independence: No helper   Medical Problem List and Plan:  1. 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 Status post T5-T7 laminectomy for resection and debulking of tumor 11/11/2012  2. DVT Prophylaxis/Anticoagulation: Subcutaneous Lovenox. Monitor platelet counts any signs of bleeding  3. Pain Management: Percocet as needed and Naprosyn. Lidoderm patch every 12 hours   -Heat, robaxin, ROM. Feeling better today 4. Hypertension. Hydrochlorothiazide 12.5 mg daily, lisinopril 40 mg daily. Monitor with increased mobility  5. Neuropsych: This patient is capable of making decisions on his/her own behalf.  6. Tobacco abuse. NicoDerm patch. Provide counseling  7. Neurogenic bladder: begin voiding trial today   LOS (Days) 3 A FACE TO FACE EVALUATION WAS PERFORMED  Medina Degraffenreid T 11/21/2012 7:49 AM

## 2012-11-21 NOTE — Progress Notes (Signed)
Physical Therapy Session Note  Patient Details  Name: Debbie [name] MRN: [id number] Date of Birth: [birth year]  Today's Date: 11/21/2012 Time: 3086-5784 Time Calculation (min): 45 min  Short Term Goals: Week 1:  PT Short Term Goal 1 (Week 1): Pt will be able to demonstrate dynamic sitting balance with min A during functional tasks PT Short Term Goal 2 (Week 1): Pt will be able to perform bed mobility with min A PT Short Term Goal 3 (Week 1): Pt will be able to demonstrate pressure relief techniques with S when OOB in w/c PT Short Term Goal 4 (Week 1): Pt will be able to mange w/c parts for preparation for transfers with S  Skilled Therapeutic Interventions/Progress Updates:    Focused session on bed mobility techniques for supine <-> sit using leg loops, sitting balance activities EOB, slide board transfers with emphasis on head hips relationship and weight shifting. Pt with increased difficulty with transfers this session due to fatigue and it seems that pt is thinking about it too hard. Also reiterated energy conservation importance. Pt up in w/c with plan to change out w/c tomorrow to a wider chair for increased comfort.   Therapy Documentation Precautions:  Precautions Precautions: Fall Precaution Comments: paraplegia Restrictions Weight Bearing Restrictions: No   Pain:  Premedicated for pain.  See FIM for current functional status  Therapy/Group: Individual Therapy  Karolee Stamps [facility] 11/21/2012, 4:07 PM

## 2012-11-21 NOTE — Progress Notes (Signed)
Foley removed at 0800. Pt reported urge to void at 1400, unsuccessful. Bladder scanned for at 1400,  cath for . Pt again called for toileting assist at 1600;  Able to void on BSC at 1600. Also continent of bowel. LBM 11/21/12 x 2. Soft formed stool on BSC x 1, as well as soft form stool x 1 on bedpan.

## 2012-11-21 NOTE — Progress Notes (Signed)
Social Work  Social Work Assessment and Plan  Patient Details  Name: Debbie [name] MRN: [id number] Date of Birth: 1976/03/20  Today's Date: 11/21/2012  Problem List:  Patient Active Problem List  Diagnosis  . Lower extremity weakness  . Hypokalemia  . HTN (hypertension)  . Myelopathy  . Paraparesis of both lower limbs  . Spinal cord compression  . Intramedullary abnormality of spinal cord  . Paraplegia   Past Medical History:  Past Medical History  Diagnosis Date  . Hypertension    Past Surgical History:  Past Surgical History  Procedure Date  . Fracture surgery   . Cesarean section   . Laminectomy 11/11/2012    Procedure: THORACIC LAMINECTOMY FOR TUMOR;  Surgeon: Temple Pacini, MD;  Location: [facility];  Service: Neurosurgery;  Laterality: N/A;  thracic laminectomy for intradural and intramedullary neoplasm   Social History:  reports that she has been smoking Cigarettes.  She has a 15 pack-year smoking history. She does not have any smokeless tobacco history on file. She reports that she drinks alcohol. She reports that she does not use illicit drugs.  Family / Support Systems Marital Status:  (fiance, Garlon Hatchet) Patient Roles: Parent Spouse/Significant Other: Esperanza Heir @ [phone] Children: Pt has two children:  5 y.o.son, Barbara Cower and 22 y.o. Thea Silversmith Other Supports: ex-mother-in-law, Andres Shad @ [phone] or [phone];  Pt's mother, Harley Alto is local and supportive; Pt's father has MS and lives in [place] Anticipated Caregiver: Ex mother-in-law, Andres Shad & fiance,Joshua Ability/Limitations of Caregiver: S-light min assist Caregiver Availability: 24/7 Family Dynamics: pt describes very supportive family and notes a good relationship with her ex-husband and his family - considers his mother to be her "best friend';  ex-husband currently has their 53 y.o. daughter with him.  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 Able to identify her strengths that will help her "to cope" with current circumstances and realistic about possible long term physical limitations.  She does become slightly tearful when she talks about her loss of control of B/B function.  States, "I feel like I can deal with it if I'm in a wheelchair but I just can't handle someone having to clean me." Recent Psychosocial Issues: None Pyschiatric History: None Substance Abuse History: None  Patient / Family Perceptions, Expectations & Goals Pt/Family understanding of illness & functional limitations: Pt able to provide very detailed report of her medical condition, surgery and current functional limitations.  Family also with good, basic understanding of overall picture - per pt. Premorbid pt/family roles/activities: Pt has been declining in physical functioning for approx one year and was compensating as best as she could - Notes the "final straw" was when she began losing B/B function and medical community began looking further into the cause of this.  Pt notes family and friends were providing increasingly more support as she declined. Anticipated changes in roles/activities/participation: Family will continue to provide any support needed.  Pt will certainly require assistance initially. Pt/family expectations/goals: Pt's primary goal is to  regain control over  B/B regimen and then focuses on increasing mobility.  Community Resources [employer]: Other (Comment) (DSS:  Food Stamps and Medicaid) Premorbid Home Care/DME Agencies: None Transportation available at discharge: yes Resource referrals recommended: Neuropsychology;Support group (specify)  Discharge Planning Living Arrangements: Spouse/significant other;Children Support Systems: Spouse/significant other;Children;Parent;Other relatives Type of Residence: Private residence Insurance Resources: Medicaid (specify county) [facility]) Financial Resources: Other (Comment);Family Support (Child Support) Financial Screen Referred: No Living Expenses: Rent Money Management: Patient Do you have any problems obtaining your medications?: No Home Management: pt and family Patient/Family Preliminary Plans: Pt plans to return to her home with fiance and family providing assistance Barriers to Discharge: Steps (Landlord is agreeable with ramp if needed) Social Work Anticipated Follow Up Needs: HH/OP;Support Group Expected length of stay: @2  weeks  Clinical Impression Very pleasant, motivated young woman here after spinal tumor surgery and resulting paraplegia.  Describes herself as having "... Always been a positive person" and this is very obvious throughout interview.  Has good family support.  No concerns at this time, but will monitor emotional adjustment throughout stay.  Debbie [name] 11/21/2012, 9:20 AM

## 2012-11-21 NOTE — Plan of Care (Signed)
Problem: RH PAIN MANAGEMENT Goal: RH STG PAIN MANAGED AT OR BELOW PT'S PAIN GOAL Less than 3  Outcome: Not Progressing Requiring prn pain medication q 4hrs

## 2012-11-21 NOTE — Progress Notes (Signed)
Physical Therapy Session Note  Patient Details  Name: Debbie [name] MRN: [id number] Date of Birth: 1976/06/11  Today's Date: 11/21/2012 Time: 0930-1000 Time Calculation (min): 30 min  Short Term Goals: Week 1:  PT Short Term Goal 1 (Week 1): Pt will be able to demonstrate dynamic sitting balance with min A during functional tasks PT Short Term Goal 2 (Week 1): Pt will be able to perform bed mobility with min A PT Short Term Goal 3 (Week 1): Pt will be able to demonstrate pressure relief techniques with S when OOB in w/c PT Short Term Goal 4 (Week 1): Pt will be able to mange w/c parts for preparation for transfers with S  Skilled Therapeutic Interventions/Progress Updates:    Treatment focused on transfers with slideboard with appropriate technique; pt tending to twist to try to get momentum through shoulders and causing increased pain to back. Focused on head/hip relationship to lead with hips during a transfer and discussed importance of energy conservation as well. Required mod A for transfer and will benefit from continued practice with weighshifting and set up/body mechanics.   Therapy Documentation Precautions:  Precautions Precautions: Fall Precaution Comments: paraplegia Restrictions Weight Bearing Restrictions: No   Pain:  Premedicated for pain near incision site.  See FIM for current functional status  Therapy/Group: Individual Therapy  Karolee Stamps [facility] 11/21/2012, 12:06 PM

## 2012-11-21 NOTE — Consult Note (Signed)
NEUROCOGNITIVE STATUS EXAMINATION - CONFIDENTIAL [facility]    Ms. Debbie [name] is a 36 year old woman who was seen for a neurocognitive status examination to assess her emotional state and mental status post-spinal tumor with subsequent paraplegia.  According to her medical record, she underwent debulking of tumor on 11/11/2012.    Emotional Functioning:  During the clinical interview, Ms. Debbie [name] was predominantly upbeat, but said that she has felt emotionally down at times secondary to thinking about how her physical issues may have affected her parenting at times.  She became tearful when discussing one incident in which she felt as though her 25-year-old son had to take on a parental role in taking care of her after a fall.  She also mentioned how she feels like she is "depriving" her children, when she cannot take them places like she previously did.  She also disclosed having periods of feeling depressed when she was not sure if she would regain the ability to control her urine and bowels and be able to respond accordingly for toileting.  However, she said that in the past day, she had a breakthrough of being aware that she needed to go to the restroom and she was able to successfully go without toileting on herself.  She said that this breakthrough has revived her optimism regarding her recovery, because it helps her to feel "human" again.  The only other concern that Ms. Debbie [name] expressed was regarding how her situation progressed without being treated properly.  However, she ultimately felt as though the situation is what it is and she will be able to cope, even if she is never able to walk again.  Ms. Debbie [name] stated that she has an excellent support system with her children, fianc, and friends and she denied current or past suicidal ideation.    A self-report measure of symptoms of depression was not indicative of clinically significant depressed mood at this time.     Mental Status:  Ms. Debbie [name] total score on an overall measure of mental status was not suggestive of the presence of significant cognitive impairment (MMSE-2 brief = 14/16).  After immediately registering 3 words into memory, she was able to freely recall 2 of 3 words after a brief delay.  She lost one additional point for disorientation to date.    Impressions and Recommendations:  During this session, we discussed coping strategies, including opening up to friends when she feels down or utilizing prayer to combat low mood.  We also spent significant time exploring how it is normal to have low mood at times, particularly when coping with significant life changes, such as the ones she is experiencing.  It can be difficult for individuals who are generally optimistic to be faced with negative emotions, as it can seem like it takes away from who they are as a person.  Ms. Debbie [name] and I discussed how having low moments does not mean that she is not a positive person; it means that she is human.  I encouraged her to recognize that there is no "right" way to feel in various situations and urged her to allow herself to feel whatever emotions she is feeling in a given moment, without self-judgment.  She was receptive to this approach and agreed to try it.  When she has tearful or low times, it may be helpful for staff to remind her that her reactions are normal in her situation and that feeling that way does not change who she is or  take away from the progress that she has made.  Extending a listening ear would also likely serve Ms. Debbie [name] well, as it would allow her to get feelings off of her chest in a safe place.    DIAGNOSIS: Paraplegia  Leavy Cella, Psy.D.  Clinical Neuropsychologist

## 2012-11-22 ENCOUNTER — Inpatient Hospital Stay (HOSPITAL_COMMUNITY): Payer: Medicaid Other | Admitting: Occupational Therapy

## 2012-11-22 ENCOUNTER — Inpatient Hospital Stay (HOSPITAL_COMMUNITY): Payer: Medicaid Other

## 2012-11-22 DIAGNOSIS — G8220 Paraplegia, unspecified: Secondary | ICD-10-CM

## 2012-11-22 DIAGNOSIS — C720 Malignant neoplasm of spinal cord: Secondary | ICD-10-CM

## 2012-11-22 DIAGNOSIS — N319 Neuromuscular dysfunction of bladder, unspecified: Secondary | ICD-10-CM

## 2012-11-22 LAB — GLUCOSE, CAPILLARY
Glucose-Capillary: 112 mg/dL — ABNORMAL HIGH (ref 70–99)
Glucose-Capillary: 112 mg/dL — ABNORMAL HIGH (ref 70–99)
Glucose-Capillary: 138 mg/dL — ABNORMAL HIGH (ref 70–99)

## 2012-11-22 MED ORDER — CYCLOBENZAPRINE HCL 5 MG PO TABS
5.0000 mg | ORAL_TABLET | Freq: Three times a day (TID) | ORAL | Status: DC
  Administered 2012-11-22 – 2012-11-25 (×10): 5 mg via ORAL
  Filled 2012-11-22 (×13): qty 1

## 2012-11-22 NOTE — Progress Notes (Signed)
Orthopedic Tech Progress Note Patient Details:  Debbie [name] Oct 30, 1976 [id number]  Patient ID: [name], female   DOB: 1976-09-07, 36 y.o.   MRN: [id number]   [name] 11/22/2012, 12:48 PM BILATERAL PRAFO'S COMPLETED BY ADVANCED.

## 2012-11-22 NOTE — Progress Notes (Signed)
Physical Therapy Session Note  Patient Details  Name: Debbie [name] MRN: [id number] Date of Birth: [birth year]  Today's Date: 11/22/2012 Time: 0930-1030 Time Calculation (min): 60 min  Short Term Goals: Week 1:  PT Short Term Goal 1 (Week 1): Pt will be able to demonstrate dynamic sitting balance with min A during functional tasks PT Short Term Goal 2 (Week 1): Pt will be able to perform bed mobility with min A PT Short Term Goal 3 (Week 1): Pt will be able to demonstrate pressure relief techniques with S when OOB in w/c PT Short Term Goal 4 (Week 1): Pt will be able to mange w/c parts for preparation for transfers with S  Skilled Therapeutic Interventions/Progress Updates:    Session focused on transfer training with slide board on level surface from w/c <-> mat with steady A with pt managing LE's and w/c set up with S. Therapist also changed w/c for better fit and positioning for pt which pt is more comfortable; basic back support place in new chair as well to provide postural support. Seated edge of mat focused on neuro re-ed to trunk with focus on limits of stability, sitting balance, and core activation using movable table and cups to simulate functional tasks; close S during activity. Postural control, standing tolerance, and WB through LE's to proprioceptive input and to help with managing tone in LE's in standing frame x 5 minutes while moving UEs in functional manner. Pt with no reports of increased dizziness or lightheadedness (states it just feels funny to be standing up) but unable to obtain a reading while pt in standing x 2 attempts. See below for other vitals.   Therapy Documentation Precautions:  Precautions Precautions: Fall Precaution Comments: paraplegia Restrictions Weight Bearing Restrictions: No General:   Vital Signs: Seated EOB BP = 109/77; HR = 104 Seated in w/c BP = 128/80; HR = 101 (right before standing in standing frame) Seated in w/c after standing (unable  to get reading in standing position) = 108/72; HR = 95 Pain: Reports pain is managed - avoid positioning which caused increased pain.   See FIM for current functional status  Therapy/Group: Individual Therapy  Karolee Stamps [facility] 11/22/2012, 11:37 AM

## 2012-11-22 NOTE — Progress Notes (Signed)
Subjective/Complaints: Right upper back still bothering her (inside of right scapula) -tends to be sharp, shooting at times, bothering her more with movement. Heat helps a bit.  Happy about emptying baldder  A 12 point review of systems has been performed and if not noted above is otherwise negative.   Objective: Vital Signs: Blood pressure 106/69, pulse 61, temperature 98.2 F (36.8 C), temperature source Oral, resp. rate 17, height 5\' 8"  (1.727 m), weight 99.6 kg (219 lb 9.3 oz), last menstrual period 11/07/2012, SpO2 97.00%. No results found. No results found for this basename: WBC:2,HGB:2,HCT:2,PLT:2 in the last 72 hours No results found for this basename: NA:2,K:2,CL:2,CO:2,GLUCOSE:2,BUN:2,CREATININE:2,CALCIUM:2 in the last 72 hours CBG (last 3)   Basename 11/21/12 2118 11/21/12 1701 11/21/12 1138  GLUCAP 131* 94 131*    Wt Readings from Last 3 Encounters:  11/22/12 99.6 kg (219 lb 9.3 oz)  11/11/12 95.9 kg (211 lb 6.7 oz)  11/11/12 95.9 kg (211 lb 6.7 oz)    Physical Exam:  Nursing note and vitals reviewed.  Constitutional: She is oriented to person, place, and time. She appears well-developed and well-nourished. obese  Eyes: Pupils are equal, round, and reactive to light.  Neck: Normal range of motion.  Cardiovascular: Normal rate and regular rhythm.  Pulmonary/Chest: Effort normal and breath sounds normal.  Abdominal: Soft. Bowel sounds are normal.  Neurological: She is alert and oriented to person, place, and time. She displays abnormal reflex. No cranial nerve deficit. Coordination abnormal.  Skin: Skin is warm and dry. Wound intact with sutures. No dressing.  Motor: 5/5 in bilateral deltoid, biceps, triceps and grip  1+ knee and hip extensor synergy in the right lower extremity proximally, at the ankle she was 1-2/5 with ADF and APF. LLE is trace proximally with 0/5 distally. She did have general sense to touch but could not differentiate pain/light touch, temp, etc.   Sensation diminished below the xiphoid process  Musc: didn't palpate focal tp today. Pain along medial border of right scapula  Assessment/Plan: 1. Functional deficits secondary to T5-6 intradural mass /sp T5-7 lami/resection which require 3+ hours per day of interdisciplinary therapy in a comprehensive inpatient rehab setting. Physiatrist is providing close team supervision and 24 hour management of active medical problems listed below. Physiatrist and rehab team continue to assess barriers to discharge/monitor patient progress toward functional and medical goals. FIM: FIM - Bathing Bathing Steps Patient Completed: Chest;Right Arm;Left Arm;Abdomen;Right upper leg;Left upper leg;Front perineal area;Buttocks;Right lower leg (including foot);Left lower leg (including foot) Bathing: 4: Steadying assist  FIM - Upper Body Dressing/Undressing Upper body dressing/undressing steps patient completed: Thread/unthread right sleeve of pullover shirt/dresss;Thread/unthread left sleeve of pullover shirt/dress;Put head through opening of pull over shirt/dress;Pull shirt over trunk Upper body dressing/undressing: 4: Steadying assist (seated edge of bed) FIM - Lower Body Dressing/Undressing Lower body dressing/undressing steps patient completed: Thread/unthread right pants leg;Thread/unthread left pants leg;Pull pants up/down;Don/Doff right sock;Don/Doff left sock;Don/Doff right shoe;Don/Doff left shoe Lower body dressing/undressing: 4: Min-Patient completed 75 plus % of tasks  FIM - Toileting Toileting steps completed by patient: Performs perineal hygiene Toileting: 0: Activity did not occur  FIM - Diplomatic Services operational officer Devices: Sliding board;Grab bars Toilet Transfers: 0-Activity did not occur  FIM - Banker Devices: Sliding board Bed/Chair Transfer: 5: Supine > Sit: Supervision (verbal cues/safety issues);3: Bed > Chair or W/C: Mod A  (lift or lower assist)  FIM - Locomotion: Wheelchair Distance: 200 Locomotion: Wheelchair: 5: Travels 150 ft or more: maneuvers  on rugs and over door sills with supervision, cueing or coaxing FIM - Locomotion: Ambulation Locomotion: Ambulation: 0: Activity did not occur  Comprehension Comprehension Mode: Auditory Comprehension: 7-Follows complex conversation/direction: With no assist  Expression Expression Mode: Verbal Expression: 7-Expresses complex ideas: With no assist  Social Interaction Social Interaction: 7-Interacts appropriately with others - No medications needed.  Problem Solving Problem Solving: 7-Solves complex problems: Recognizes & self-corrects  Memory Memory: 7-Complete Independence: No helper   Medical Problem List and Plan:  1. 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 Status post T5-T7 laminectomy for resection and debulking of tumor 11/11/2012  2. DVT Prophylaxis/Anticoagulation: Subcutaneous Lovenox. Monitor platelet counts any signs of bleeding  3. Pain Management: Percocet as needed and Naprosyn. Lidoderm patch every 12 hours   -still feel intrascapular pain is myofascial, but it also likely referred from op site.   -try ice AND heat, change robaxin to flexeril, check xrays of t-spine today.   -encouraged her to slow down a bit, use good form, etc 4. Hypertension. Hydrochlorothiazide 12.5 mg daily, lisinopril 40 mg daily. Monitor with increased mobility  5. Neuropsych: This patient is capable of making decisions on his/her own behalf.  6. Tobacco abuse. NicoDerm patch. Provide counseling  7. Neurogenic bladder: voiding with some residual,   -continue voiding trial   LOS (Days) 4 A FACE TO FACE EVALUATION WAS PERFORMED  SWARTZ,ZACHARY T 11/22/2012 7:20 AM

## 2012-11-22 NOTE — Plan of Care (Signed)
Problem: Food- and Nutrition-Related Knowledge Deficit (NB-1.1) Goal: Nutrition education Formal process to instruct or train a patient/client in a skill or to impart knowledge to help patients/clients voluntarily manage or modify food choices and eating behavior to maintain or improve health.   RD consulted for nutrition education regarding weight loss.  Body mass index is 33.39 kg/(m^2). Pt meets criteria for obesity grade 1 based on current BMI.  RD provided "Weight Loss Tips" handout from the Academy [employer]. Emphasized the importance of serving sizes and provided examples of correct portions of common foods. Discussed importance of controlled and consistent intake throughout the day. Provided examples of ways to balance meals/snacks and encouraged intake of high-fiber, whole grain complex carbohydrates. Emphasized the importance of hydration with calorie-free beverages and limiting sugar-sweetened beverages. Encouraged pt to discuss physical activity options with physician. Teach back method used.  Expect good compliance.  Pt verbalized issues with weight and HTN as well as mobilization.  Provided pt with apps to track intake and calories if desired.  Current diet order is regular, patient is consuming approximately 100% of meals at this time. Labs and medications reviewed. No further nutrition interventions warranted at this time. RD contact information provided. If additional nutrition issues arise, please re-consult RD.  Oran Rein, RD, LDN Clinical Inpatient Dietitian Pager:  [phone] Weekend and after hours pager:  [phone]

## 2012-11-22 NOTE — Progress Notes (Signed)
Occupational Therapy Session Notes  Patient Details  Name: Debbie [name] MRN: [id number] Date of Birth: 16-Aug-1976  Today's Date: 11/22/2012  Short Term Goals: Week 1:  OT Short Term Goal 1 (Week 1): Patient will bathe 10/10 body parts with minimal assistance OT Short Term Goal 2 (Week 1): Patient will donn shirt sitting edge of bed with minimal assistance OT Short Term Goal 3 (Week 1): Patient will perform LB dressing with moderate assistance OT Short Term Goal 4 (Week 1): Patient will perform toilet transfer onto drop arm BSC using slide board prn with moderate assistance  Skilled Therapeutic Interventions/Progress Updates:   Session #1 1191-4782 - 60 Minutes Individual Therapy No complaints of pain Patient found supine in bed. Engaged in bed mobility for UB/LB bathing & dressing with overall supervision/set-up -> steady assist. Focused skilled intervention on bed mobility, BLE management, overall activity tolerance/endurance, slide board transfer from edge of bed->w/c, w/c positioning, w/c management, discussion on energy conservation (including rest breaks & pursed lip breathing), and grooming tasks at sink. Patient left seated in w/c at sink to complete grooming tasks. Patient able to maneuver around room prn.   Session #2 1415-1500 - 45 Minutes Individual Therapy No complaints of pain Patient found supine in bed. Engaged in bed mobility for edge of bed -> drop arm BSC transfer using slide board. Patient with LOB once on BSC to the right (secondary to anxiety per her report). From [facility] patient transferred onto w/c using slide board. Patient then propelled self from room -> therapy gym for education regarding UE exercises and trunk exercises. Patient propelled self from gym -> room independently.   Precautions:  Precautions Precautions: Fall Precaution Comments: paraplegia Restrictions Weight Bearing Restrictions: No  See FIM for current functional  status  Kue Fox 11/22/2012, 7:33 AM

## 2012-11-22 NOTE — Progress Notes (Signed)
Physical Therapy Session Note  Patient Details  Name: Debbie [name] MRN: [id number] Date of Birth: 1976-03-30  Today's Date: 11/22/2012 Time: 9629-5284 Time Calculation (min): 30 min  Short Term Goals: Week 1:  PT Short Term Goal 1 (Week 1): Pt will be able to demonstrate dynamic sitting balance with min A during functional tasks PT Short Term Goal 2 (Week 1): Pt will be able to perform bed mobility with min A PT Short Term Goal 3 (Week 1): Pt will be able to demonstrate pressure relief techniques with S when OOB in w/c PT Short Term Goal 4 (Week 1): Pt will be able to mange w/c parts for preparation for transfers with S  Skilled Therapeutic Interventions/Progress Updates:    Treatment focused on PROM/stretching to bilateral LE's to decrease tone and increase flexibility to aid with functional mobility and prevent contractures. Discussed using PRAFO's at night to help with heel cord tightness (asked for order from [facility]). Increased active movement in ankle this AM noted.   Therapy Documentation Precautions:  Precautions Precautions: Fall Precaution Comments: paraplegia Restrictions Weight Bearing Restrictions: No General:   Vital Signs:  Pain: R shoulderblade/back still bothering her but medication received.   See FIM for current functional status  Therapy/Group: Individual Therapy  Karolee Stamps [facility] 11/22/2012, 8:28 AM

## 2012-11-23 ENCOUNTER — Inpatient Hospital Stay (HOSPITAL_COMMUNITY): Payer: Medicaid Other | Admitting: Physical Therapy

## 2012-11-23 ENCOUNTER — Inpatient Hospital Stay (HOSPITAL_COMMUNITY): Payer: Medicaid Other | Admitting: Occupational Therapy

## 2012-11-23 LAB — GLUCOSE, CAPILLARY
Glucose-Capillary: 151 mg/dL — ABNORMAL HIGH (ref 70–99)
Glucose-Capillary: 120 mg/dL — ABNORMAL HIGH (ref 70–99)

## 2012-11-23 NOTE — Plan of Care (Signed)
Problem: SCI BLADDER ELIMINATION Goal: RH STG MANAGE BLADDER WITH ASSISTANCE STG Manage Bladder With minimal assistance (foley removed 12/19)  Outcome: Not Progressing Requires I/O caths, with high volume output

## 2012-11-23 NOTE — Progress Notes (Signed)
Occupational Therapy Session Note  Patient Details  Name: Debbie [name] MRN: [id number] Date of Birth: Nov 16, 1976  Today's Date: 11/23/2012 Time: 1000-1045 and 1400-1430 Time Calculation (min): 45 min and 30 min  Short Term Goals: Week 1:  OT Short Term Goal 1 (Week 1): Patient will bathe 10/10 body parts with minimal assistance OT Short Term Goal 2 (Week 1): Patient will donn shirt sitting edge of bed with minimal assistance OT Short Term Goal 3 (Week 1): Patient will perform LB dressing with moderate assistance OT Short Term Goal 4 (Week 1): Patient will perform toilet transfer onto drop arm BSC using slide board prn with moderate assistance      Skilled Therapeutic Interventions/Progress Updates:    Visit 1:  Pt had already completed bathing and dressing prior to OT session, but she stated that her main obstacle was feeling comfortable and confident with lateral leans to adjust clothing over hips during toileting tasks. Pt seen in gym to work on trunk control from edge of mat.  Pt worked on reaching to right and left in a supported position with arm on ball and in unsupported position to work on reaction time with "catching herself".  Medium ball placed behind her back as she reclined slightly and then used trunk muscles to pull herself into upright.  Pt completed w/c to mat transfers with sliding board with close supervision.  Visit 2: Pt was in bed resting at start of session.  She explained that she was extremely fatigued as she had just completed a toilet transfer, but had to completely change clothing 2x due to accidents during toileting.  She was frustrated and exhausted but willing to participate.  Therapy focused on trunk control exercises from bed with HOB raised for forward active flexion and then with HOB flat to work on lifting trunk up by pushing through elbows. Pt did very well with this, but had a muscle spasm/ache in traps/rhomboid area.  Provided deep massage and trigger point  to the area and we discussed avoiding exercises that strain that area for a day or two.    Therapy Documentation Precautions:  Precautions Precautions: Fall Precaution Comments: paraplegia Restrictions Weight Bearing Restrictions: No   Pain: Pain Assessment Pain Assessment: No/denies pain ADL:  See FIM for current functional status  Therapy/Group: Individual Therapy  SAGUIER,JULIA 11/23/2012, 12:09 PM

## 2012-11-23 NOTE — Progress Notes (Signed)
Physical Therapy Session Note  Patient Details  Name: Debbie [name] MRN: [id number] Date of Birth: 03/22/1976  Today's Date: 11/23/2012 Time: 0830-0930 Time Calculation (min): 60 min  Short Term Goals: Week 1:  PT Short Term Goal 1 (Week 1): Pt will be able to demonstrate dynamic sitting balance with min A during functional tasks PT Short Term Goal 2 (Week 1): Pt will be able to perform bed mobility with min A PT Short Term Goal 3 (Week 1): Pt will be able to demonstrate pressure relief techniques with S when OOB in w/c PT Short Term Goal 4 (Week 1): Pt will be able to mange w/c parts for preparation for transfers with S   Therapy Documentation Precautions:  Precautions: Fall Precaution Comments: paraplegia Weight Bearing Restrictions: No Pain: Pain Score:   2-7 Pain Type: Surgical pain Pain Location: Shoulder/Rhomboid Pain Orientation: Right Pain Descriptors: Aching Pain Frequency: Constant Pain Onset: Progressive Patients Stated Pain Goal: 2 Pain Intervention(s): Medication (See eMAR)  Therapeutic Exercise:(30') B LE's in supine including manual stretching of gastrocs, w/c mobility for generalized strengthening of UE's on straightaways in hall x 400' Therapeutic Activity:(15') Deep Tissue Ischemic Pressure to R rhomboid area of tenderness to palpation with decreased ttp post treatment                             Bed Mobility: rolling Left and Right with min-A for LE's, supine<->sit w/ S/Mod-I using Leg loops, transfer bed->w/c toward Right using short slideboard with S/min-A Neuromuscular Reeducation:(15') B LE's with Rhythmic Initiation Linear patterns for flexion and extension and AAROM hip abd/adduction  See FIM for current functional status  Therapy/Group: Individual Therapy  Rex Kras 11/23/2012, 8:33 AM

## 2012-11-23 NOTE — Progress Notes (Signed)
Patient ID: Debbie [name], female   DOB: [birth year], 36 y.o.   MRN: [id number] Subjective/Complaints:  64/84. 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   Right upper back still bothering her (inside of right scapula) -tends to be sharp, shooting at times, bothering her more with movement. Heat helps a bit.  Thoracic spine x-rays yesterday revealed no osseous abnormality. Otherwise no complaints  Happy about emptying bladder  but  still having a difficult time when supine in bed  A 12 point review of systems has been performed and if not noted above is otherwise negative. HEENT negative. Chest clear. Cardiovascular normal heart sounds no murmur area no tachycardia. Abdomen soft flat and nondistended. Extremities-paraplegia with minimal flexion /extension right foot only  BP Readings from Last 3 Encounters:  11/23/12 117/76  11/18/12 142/71  11/18/12 142/71   Objective: Vital Signs: Blood pressure 117/76, pulse 70, temperature 97.9 F (36.6 C), temperature source Oral, resp. rate 20, height 5\' 8"  (1.727 m), weight 219 lb 9.3 oz (99.6 kg), last menstrual period 11/07/2012, SpO2 98.00%. Dg Thoracic Spine 1 View  11/22/2012  *RADIOLOGY REPORT*  Clinical Data: Intradural thoracic tumor at T5-T6.  Increased back pain extending into the right periscapular region.  THORACIC SPINE - 1 VIEW  Comparison: Thoracic MRI 11/08/2012.  Lumbar radiographs 05/29/2012.  Findings: Two AP views submitted.  There are no visible ribs at T12. The thoracic alignment is normal in the AP projection.  There is no osseous erosion, widening of the interpedicular distance or paraspinal abnormality.  The costovertebral junctions appear normal.  IMPRESSION: No thoracic spine osseous abnormality identified.  No visible ribs at T12.   Original Report Authenticated By: Carey Bullocks, M.D.    No results found for this basename: WBC:2,HGB:2,HCT:2,PLT:2 in the last 72 hours No results found for this basename:  NA:2,K:2,CL:2,CO:2,GLUCOSE:2,BUN:2,CREATININE:2,CALCIUM:2 in the last 72 hours CBG (last 3)   Basename 11/22/12 2102 11/22/12 1806 11/22/12 1143  GLUCAP 138* 112* 104*    Wt Readings from Last 3 Encounters:  11/22/12 219 lb 9.3 oz (99.6 kg)  11/11/12 211 lb 6.7 oz (95.9 kg)  11/11/12 211 lb 6.7 oz (95.9 kg)    Physical Exam:  Nursing note and vitals reviewed.  Constitutional: She is oriented to person, place, and time. She appears well-developed and well-nourished. obese  Eyes: Pupils are equal, round, and reactive to light.  Neck: Normal range of motion.  Cardiovascular: Normal rate and regular rhythm.  Pulmonary/Chest: Effort normal and breath sounds normal.  Abdominal: Soft. Bowel sounds are normal.  Neurological: She is alert and oriented to person, place, and time. She displays abnormal reflex. No cranial nerve deficit. Coordination abnormal.  Skin: Skin is warm and dry. Wound intact with sutures. No dressing.  Motor: 5/5 in bilateral deltoid, biceps, triceps and grip  1+ knee and hip extensor synergy in the right lower extremity proximally, at the ankle she was 1-2/5 with ADF and APF. LLE is trace proximally with 0/5 distally. She did have general sense to touch but could not differentiate pain/light touch, temp, etc.  Sensation diminished below the xiphoid process  Musc: didn't palpate focal tp today. Pain along medial border of right scapula  Assessment/Plan: 1. Functional deficits secondary to T5-6 intradural mass /sp T5-7 lami/resection which require 3+ hours per day of interdisciplinary therapy in a comprehensive inpatient rehab setting. Physiatrist is providing close team supervision and 24 hour management of active medical problems listed below. Physiatrist and rehab team continue to assess barriers to discharge/monitor  patient progress toward functional and medical goals. FIM: FIM - Bathing Bathing Steps Patient Completed: Chest;Right Arm;Left Arm;Abdomen;Front  perineal area;Buttocks;Right upper leg;Left upper leg;Right lower leg (including foot);Left lower leg (including foot) Bathing: 5: Supervision: Safety issues/verbal cues  FIM - Upper Body Dressing/Undressing Upper body dressing/undressing steps patient completed: Thread/unthread right sleeve of pullover shirt/dresss;Thread/unthread left sleeve of pullover shirt/dress;Thread/unthread right bra strap;Thread/unthread left bra strap;Hook/unhook bra;Put head through opening of pull over shirt/dress;Pull shirt over trunk Upper body dressing/undressing: 5: Supervision: Safety issues/verbal cues (supine in bed) FIM - Lower Body Dressing/Undressing Lower body dressing/undressing steps patient completed: Thread/unthread right pants leg;Thread/unthread left pants leg;Pull pants up/down;Don/Doff right sock;Don/Doff left sock;Don/Doff right shoe;Don/Doff left shoe;Fasten/unfasten right shoe;Fasten/unfasten left shoe Lower body dressing/undressing: 4: Steadying Assist  FIM - Toileting Toileting steps completed by patient: Performs perineal hygiene Toileting: 0: Activity did not occur  FIM - Diplomatic Services operational officer Devices: Sliding board;Bedside commode (drop arm) Toilet Transfers: 4-To toilet/BSC: Min A (steadying Pt. > 75%);4-From toilet/BSC: Min A (steadying Pt. > 75%) (simulated)  FIM - Banker Devices: Sliding board;Bed rails;Arm rests;HOB elevated Bed/Chair Transfer: 1: Two helpers  FIM - Locomotion: Wheelchair Distance: 200 Locomotion: Wheelchair: 6: Travels 150 ft or more, turns around, maneuvers to table, bed or toilet, negotiates 3% grade: maneuvers on rugs and over door sills independently FIM - Locomotion: Ambulation Locomotion: Ambulation: 0: Activity did not occur  Comprehension Comprehension Mode: Auditory Comprehension: 7-Follows complex conversation/direction: With no assist  Expression Expression Mode: Verbal Expression:  7-Expresses complex ideas: With no assist  Social Interaction Social Interaction: 7-Interacts appropriately with others - No medications needed.  Problem Solving Problem Solving: 7-Solves complex problems: Recognizes & self-corrects  Memory Memory: 7-Complete Independence: No helper   Medical Problem List and Plan:  1. 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 Status post T5-T7 laminectomy for resection and debulking of tumor 11/11/2012  2. DVT Prophylaxis/Anticoagulation: Subcutaneous Lovenox. Monitor platelet counts any signs of bleeding  3. Pain Management: Percocet as needed and Naprosyn. Lidoderm patch every 12 hours   -still feel intrascapular pain is myofascial, but it also likely referred from op site.   -try ice AND heat, change robaxin to flexeril, check xrays of t-spine today.   -encouraged her to slow down a bit, use good form, etc 4. Hypertension. Hydrochlorothiazide 12.5 mg daily, lisinopril 40 mg daily. Monitor with increased mobility  5. Neuropsych: This patient is capable of making decisions on his/her own behalf.  6. Tobacco abuse. NicoDerm patch. Provide counseling  7. Neurogenic bladder: voiding with some residual,   -continue voiding trial   LOS (Days) 5 A FACE TO FACE EVALUATION WAS PERFORMED  Rogelia Boga 11/23/2012 9:03 AM

## 2012-11-23 NOTE — Progress Notes (Signed)
Physical Therapy Session Note  Patient Details  Name: Debbie [name] MRN: [id number] Date of Birth: [birth year]  Today's Date: 11/23/2012 Time: 1400-1430 Time Calculation (min): 30 min  Short Term Goals: Week 1:  PT Short Term Goal 1 (Week 1): Pt will be able to demonstrate dynamic sitting balance with min A during functional tasks PT Short Term Goal 2 (Week 1): Pt will be able to perform bed mobility with min A PT Short Term Goal 3 (Week 1): Pt will be able to demonstrate pressure relief techniques with S when OOB in w/c PT Short Term Goal 4 (Week 1): Pt will be able to mange w/c parts for preparation for transfers with S  Therapy Documentation Precautions:  Precautions: Fall Precaution Comments: paraplegia Weight Bearing Restrictions: No Pain: Pain Score:  0 to 4 Pain Type: Acute pain Pain Location: Shoulder Pain Orientation: Right Pain Descriptors: Aching;Sharp Pain Frequency: Constant Pain Onset: On-going Pain Intervention(s): Medication (See eMAR)  Patient reporting an exhausting day with multiple transfers including recent toilet transfers and dressing/bathing tasks and requesting that tx session focus on bed therapeutic exercises.  Patient using heating pad for R rhomboid area pain.  Therapeutic Exercise:(45')  PROM/AAROM and AROM R and L LE's.  Trial of green theraband use for R and L gastroc stretch and resisted plantar flexion.  See FIM for current functional status  Therapy/Group: Individual Therapy  Rex Kras 11/23/2012, 4:14 PM

## 2012-11-24 ENCOUNTER — Inpatient Hospital Stay (HOSPITAL_COMMUNITY): Payer: Medicaid Other

## 2012-11-24 LAB — URINALYSIS, ROUTINE W REFLEX MICROSCOPIC
Bilirubin Urine: NEGATIVE
Protein, ur: NEGATIVE mg/dL
Urobilinogen, UA: 1 mg/dL (ref 0.0–1.0)

## 2012-11-24 LAB — GLUCOSE, CAPILLARY
Glucose-Capillary: 96 mg/dL (ref 70–99)
Glucose-Capillary: 92 mg/dL (ref 70–99)
Glucose-Capillary: 117 mg/dL — ABNORMAL HIGH (ref 70–99)
Glucose-Capillary: 106 mg/dL — ABNORMAL HIGH (ref 70–99)

## 2012-11-24 LAB — URINE MICROSCOPIC-ADD ON

## 2012-11-24 NOTE — Progress Notes (Signed)
Patient ID: GRAINNE KNIGHTS, female   DOB: September 30, 1976, 36 y.o.   MRN: [id number] Patient ID: SHIRLEEN MCFAUL, female   DOB: 1976-06-29, 36 y.o.   MRN: [id number] Subjective/Complaints:  53/42.  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   Right upper back still bothering her (inside of right scapula) -tends to be sharp, shooting at times, bothering her more with movement. Heat helps a bit.  Thoracic spine x-rays 12/20  revealed no osseous abnormality.  States that she is able to void somewhat spontaneously when she is sitting on the toilet and pushing down on her bladder but basically is requiring frequent I/O for large  RV.  The patient is complaining of some dysuria with voiding   A 12 point review of systems has been performed and if not noted above is otherwise negative. HEENT negative. Chest clear. Cardiovascular normal heart sounds no murmur area no tachycardia. Abdomen soft flat and nondistended. Extremities-paraplegia with minimal flexion /extension right foot only  BP Readings from Last 3 Encounters:  11/24/12 117/75  11/18/12 142/71  11/18/12 142/71   We'll check UA and urine culture. Bilateral PRAFO at bedtime ordered  Objective: Vital Signs: Blood pressure 117/75, pulse 61, temperature 98.7 F (37.1 C), temperature source Oral, resp. rate 18, height 5\' 8"  (1.727 m), weight 99.6 kg (219 lb 9.3 oz), last menstrual period 11/07/2012, SpO2 98.00%. Dg Thoracic Spine 1 View  11/22/2012  *RADIOLOGY REPORT*  Clinical Data: Intradural thoracic tumor at T5-T6.  Increased back pain extending into the right periscapular region.  THORACIC SPINE - 1 VIEW  Comparison: Thoracic MRI 11/08/2012.  Lumbar radiographs 05/29/2012.  Findings: Two AP views submitted.  There are no visible ribs at T12. The thoracic alignment is normal in the AP projection.  There is no osseous erosion, widening of the interpedicular distance or paraspinal abnormality.  The costovertebral junctions appear  normal.  IMPRESSION: No thoracic spine osseous abnormality identified.  No visible ribs at T12.   Original Report Authenticated By: Carey Bullocks, M.D.    No results found for this basename: WBC:2,HGB:2,HCT:2,PLT:2 in the last 72 hours No results found for this basename: NA:2,K:2,CL:2,CO:2,GLUCOSE:2,BUN:2,CREATININE:2,CALCIUM:2 in the last 72 hours CBG (last 3)   Basename 11/23/12 2133 11/23/12 1648 11/23/12 1133  GLUCAP 120* 151* 88    Wt Readings from Last 3 Encounters:  11/22/12 99.6 kg (219 lb 9.3 oz)  11/11/12 95.9 kg (211 lb 6.7 oz)  11/11/12 95.9 kg (211 lb 6.7 oz)    Physical Exam:  Nursing note and vitals reviewed.  Constitutional: She is oriented to person, place, and time. She appears well-developed and well-nourished. obese  Eyes: Pupils are equal, round, and reactive to light.  Neck: Normal range of motion.  Cardiovascular: Normal rate and regular rhythm.  Pulmonary/Chest: Effort normal and breath sounds normal.  Abdominal: Soft. Bowel sounds are normal.  Neurological: She is alert and oriented to person, place, and time. She displays abnormal reflex. No cranial nerve deficit. Coordination abnormal.  Skin: Skin is warm and dry. Wound intact with sutures. No dressing.  Motor: 5/5 in bilateral deltoid, biceps, triceps and grip  1+ knee and hip extensor synergy in the right lower extremity proximally, at the ankle she was 1-2/5 with ADF and APF. LLE is trace proximally with 0/5 distally. She did have general sense to touch but could not differentiate pain/light touch, temp, etc.  Sensation diminished below the xiphoid process  Musc: didn't palpate focal tp today. Pain along medial border of right scapula  Assessment/Plan: 1. Functional deficits secondary to T5-6 intradural mass /sp T5-7 lami/resection which require 3+ hours per day of interdisciplinary therapy in a comprehensive inpatient rehab setting. Physiatrist is providing close team supervision and 24 hour management  of active medical problems listed below. Physiatrist and rehab team continue to assess barriers to discharge/monitor patient progress toward functional and medical goals. FIM: FIM - Bathing Bathing Steps Patient Completed: Chest;Right Arm;Left Arm;Abdomen;Front perineal area;Buttocks;Right upper leg;Left upper leg;Right lower leg (including foot);Left lower leg (including foot) Bathing: 5: Supervision: Safety issues/verbal cues  FIM - Upper Body Dressing/Undressing Upper body dressing/undressing steps patient completed: Thread/unthread right sleeve of pullover shirt/dresss;Thread/unthread left sleeve of pullover shirt/dress;Thread/unthread right bra strap;Thread/unthread left bra strap;Hook/unhook bra;Put head through opening of pull over shirt/dress;Pull shirt over trunk Upper body dressing/undressing: 5: Supervision: Safety issues/verbal cues (supine in bed) FIM - Lower Body Dressing/Undressing Lower body dressing/undressing steps patient completed: Thread/unthread right pants leg;Thread/unthread left pants leg;Pull pants up/down;Don/Doff right sock;Don/Doff left sock;Don/Doff right shoe;Don/Doff left shoe;Fasten/unfasten right shoe;Fasten/unfasten left shoe Lower body dressing/undressing: 4: Steadying Assist  FIM - Toileting Toileting steps completed by patient: Performs perineal hygiene Toileting: 0: Activity did not occur  FIM - Diplomatic Services operational officer Devices: Sliding board;Bedside commode (drop arm) Toilet Transfers: 4-To toilet/BSC: Min A (steadying Pt. > 75%);4-From toilet/BSC: Min A (steadying Pt. > 75%) (simulated)  FIM - Banker Devices: Sliding board;Bed rails;Arm rests;HOB elevated Bed/Chair Transfer: 1: Two helpers  FIM - Locomotion: Wheelchair Distance: 200 Locomotion: Wheelchair: 6: Travels 150 ft or more, turns around, maneuvers to table, bed or toilet, negotiates 3% grade: maneuvers on rugs and over door sills  independently FIM - Locomotion: Ambulation Locomotion: Ambulation: 0: Activity did not occur  Comprehension Comprehension Mode: Auditory Comprehension: 7-Follows complex conversation/direction: With no assist  Expression Expression Mode: Verbal Expression: 7-Expresses complex ideas: With no assist  Social Interaction Social Interaction: 7-Interacts appropriately with others - No medications needed.  Problem Solving Problem Solving: 7-Solves complex problems: Recognizes & self-corrects  Memory Memory: 7-Complete Independence: No helper   Medical Problem List and Plan:  1. 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 Status post T5-T7 laminectomy for resection and debulking of tumor 11/11/2012  2. DVT Prophylaxis/Anticoagulation: Subcutaneous Lovenox. Monitor platelet counts any signs of bleeding  3. Pain Management: Percocet as needed and Naprosyn. Lidoderm patch every 12 hours   -still feel intrascapular pain is myofascial, but it also likely referred from op site.   -try ice AND heat, change robaxin to flexeril, check xrays of t-spine today.   -encouraged her to slow down a bit, use good form, etc 4. Hypertension. Hydrochlorothiazide 12.5 mg daily, lisinopril 40 mg daily. Monitor with increased mobility  5. Neuropsych: This patient is capable of making decisions on his/her own behalf.  6. Tobacco abuse. NicoDerm patch. Provide counseling  7. Neurogenic bladder: voiding with some residual,   -continue voiding trial   LOS (Days) 6 A FACE TO FACE EVALUATION WAS PERFORMED  Rogelia Boga 11/24/2012 8:26 AM

## 2012-11-24 NOTE — Progress Notes (Addendum)
1140 Pt. Had a witnessed assisted fall in patient's bathroom with Nursing Tech while transferring from toilet to wheelchair via slideboard.  Pt. AOX3, CBG WDL and VSS. Neuro assessment unchanged from initial AM shift assessement.   Pt. State's she slid from toilet to bathroom floor with NT.  No head injury occurred, skin clean and intact; pt. Denies any pain.    MD notified at 1215.  Safety precautions in place, call bell in reach.  Vital Signs to be obtained at 2pm and then VS Q4hrs X 24 hrs per Nursing Protocol.  Hourly rounding recommended and in place.

## 2012-11-24 NOTE — Progress Notes (Signed)
Occupational Therapy Session Note  Patient Details  Name: Debbie [name] MRN: [id number] Date of Birth: 1976/04/15  Today's Date: 11/24/2012 Time: 1000-1110 Time Calculation (min): 70 min  Short Term Goals: Week 1:  OT Short Term Goal 1 (Week 1): Patient will bathe 10/10 body parts with minimal assistance OT Short Term Goal 2 (Week 1): Patient will donn shirt sitting edge of bed with minimal assistance OT Short Term Goal 3 (Week 1): Patient will perform LB dressing with moderate assistance OT Short Term Goal 4 (Week 1): Patient will perform toilet transfer onto drop arm BSC using slide board prn with moderate assistance  Skilled Therapeutic Interventions:  ADL-retraining at tub/shower with use of tub bench and emphasis on dynamic sitting balance, transfers, lateral leans, weight-shifting, management of bil LE, safety and effective use of DME and AE for ADL.   Patient completed initial training on sliding board to tub bench transfer this date but lacks UE strength to overcome height differential between w/c and tub bench.   Patient able to complete bathing with supervision although unable to reach her buttocks; OT discussed use of bench w/cut-out, as needed.   Patient also requests insights on use of AE for toileting; OT discussed use of BSC over toilet to improve access and use of toilet aids although not demonstrated this date.  Patient able to appreciate benefit of transfer handle on standard bed during dressing as well as use of small platform to support feet, providing greater stability at trunk.   Patient demonstrates excellent problem-solving and self-monitoring of symptoms/deficits during collaborative treatment session.    Therapy Documentation Precautions:  Precautions Precautions: Fall Precaution Comments: paraplegia Restrictions Weight Bearing Restrictions: No  Pain: Pain Assessment Pain Assessment: 0-10 Pain Score:   3 Pain Type: Acute pain Pain Location: Shoulder Pain  Orientation: Right Pain Descriptors: Pressure Pain Intervention(s): Repositioned;Distraction;Shower  See FIM for current functional status  Therapy/Group: Individual Therapy  Georgeanne Nim 11/24/2012, 12:24 PM

## 2012-11-25 ENCOUNTER — Inpatient Hospital Stay (HOSPITAL_COMMUNITY): Payer: Medicaid Other | Admitting: Occupational Therapy

## 2012-11-25 ENCOUNTER — Inpatient Hospital Stay (HOSPITAL_COMMUNITY): Payer: Medicaid Other

## 2012-11-25 DIAGNOSIS — N319 Neuromuscular dysfunction of bladder, unspecified: Secondary | ICD-10-CM

## 2012-11-25 DIAGNOSIS — G8220 Paraplegia, unspecified: Secondary | ICD-10-CM

## 2012-11-25 DIAGNOSIS — C720 Malignant neoplasm of spinal cord: Secondary | ICD-10-CM

## 2012-11-25 LAB — URINE CULTURE

## 2012-11-25 LAB — GLUCOSE, CAPILLARY
Glucose-Capillary: 109 mg/dL — ABNORMAL HIGH (ref 70–99)
Glucose-Capillary: 129 mg/dL — ABNORMAL HIGH (ref 70–99)

## 2012-11-25 MED ORDER — METHOCARBAMOL 500 MG PO TABS
500.0000 mg | ORAL_TABLET | Freq: Three times a day (TID) | ORAL | Status: DC
  Administered 2012-11-25 – 2012-12-05 (×32): 500 mg via ORAL
  Filled 2012-11-25 (×40): qty 1

## 2012-11-25 NOTE — Progress Notes (Signed)
Occupational Therapy Session Note  Patient Details  Name: Debbie [name] MRN: [id number] Date of Birth: [birth year]  Today's Date: 11/25/2012 Time: 0800-0900 Time Calculation (min): 60 min  Short Term Goals: Week 1:  OT Short Term Goal 1 (Week 1): Patient will bathe 10/10 body parts with minimal assistance OT Short Term Goal 2 (Week 1): Patient will donn shirt sitting edge of bed with minimal assistance OT Short Term Goal 3 (Week 1): Patient will perform LB dressing with moderate assistance OT Short Term Goal 4 (Week 1): Patient will perform toilet transfer onto drop arm BSC using slide board prn with moderate assistance  Skilled Therapeutic Interventions/Progress Updates:  Patient resting in bed upon arrival.  Self care retraining to include sponge bath and dress at bed level with focus on not using HOB up and only use of one bed rail to simulate flat bed at home.  Patient performed bath, undress and dress supine, sidelying, unsupported sit EOB with both feet flat on floor as well as turn hips, legs and trunk to allow for one leg to be propped on bed for ease of bath, dress and feeling of increased stability.  Also focused on slowing down her movements by remembering to stabilize in trunk BEFORE move or reach and to make those movements slow and deliberate.  Patient reports that this method seed to help.  Patient left supine in bed awaiting NT for I/O cath.  Precautions:  Precautions Precautions: Fall Precaution Comments: paraplegia Restrictions Weight Bearing Restrictions: No Pain: 7-8/10 upper back, right side, RN notified and medication provided.  Gentle soft tissue massage provided and adaptive techniques to accommodate pain.  See FIM for current functional status  Therapy/Group: Individual Therapy  Debbie [name] 11/25/2012, 9:07 AM

## 2012-11-25 NOTE — Evaluation (Signed)
Recreational Therapy Assessment and Plan  Patient Details  Name: Debbie [name] MRN: [id number] Date of Birth: 03-25-1976 Today's Date: 11/25/2012  Rehab Potential: Good ELOS: 2 weeks   Assessment Clinical Impression: Problem List:  Patient Active Problem List   Diagnosis   .  Lower extremity weakness   .  Hypokalemia   .  HTN (hypertension)   .  Myelopathy   .  Paraparesis of both lower limbs   .  Spinal cord compression   .  Intramedullary abnormality of spinal cord   .  Paraplegia    Past Medical History:  Past Medical History   Diagnosis  Date   .  Hypertension     Past Surgical History:  Past Surgical History   Procedure  Date   .  Fracture surgery    .  Cesarean section    .  Laminectomy  11/11/2012     Procedure: THORACIC LAMINECTOMY FOR TUMOR; Surgeon: Temple Pacini, MD; Location: [facility]; Service: Neurosurgery; Laterality: N/A; thracic laminectomy for intradural and intramedullary neoplasm    Assessment & Plan  Clinical Impression: Patient is a 36 y.o. year old female with recent admission to the hospital with a history of hypertension and tobacco abuse who presented to the emergency room on 11/07/12 with complaint of increasing weakness and numbness of lower extremities over the past 6 months as well as increasing difficulty with standing and walking with recurrent falls. A week PTA she developed incontinence of urine and bowel, BLE spams L>R as well as intermittent back and hip pain. She was evaluated by Dr. Roseanne Reno who recommended IV steriods as well as MRI thoracic spine for work up of chronic progressive myelopathy. 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 Patient underwent T5-T7 lam for resection and debulking of tumor on 11/11/12 by Dr. Jordan Likes and initially placed on bed rest. Maintained on subcutaneous Lovenox for DVT prophylaxis. Decadron protocol ongoing as per neurosurgery and presently on 4 mg every 6 hours and plan slow taper. Close  monitoring of blood sugars while on steroids. Motor exam with slow return but patient reporting some improvement in sensation RLE. Patient transferred to CIR on 11/18/2012 .   Pt presents with decreased activity tolerance, decreased functional mobility, decreased balance, paralysis, & decreased sensation Limiting pt's independence with leisure/community pursuits.   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;Community - Travel (Comment) (anything with my kids) Expression Interests: Music (Comment);Singing;Dance (DJ's & karoke) Other Leisure Interests: Television;Movies;Videogames;Computer;Cooking/Baking Leisure Participation Style: With Family/Friends Awareness of Community Resources: Good-identify 3 post discharge leisure resources Psychosocial / Spiritual Patient agreeable to Pet Therapy: Yes Social interaction - Mood/Behavior: Cooperative Firefighter Appropriate for Education?: Yes Recreational Therapy Orientation Orientation -Reviewed with patient: Available activity resources Strengths/Weaknesses Patient Strengths/Abilities: Willingness to participate;Active premorbidly Patient weaknesses: Physical limitations  Plan Rec Therapy Plan Is patient appropriate for Therapeutic Recreation?: Yes Rehab Potential: Good Treatment times per week: Min 1 time per week >20 minutes Estimated Length of Stay: 2 weeks TR Treatment/Interventions: Adaptive equipment instruction;1:1 session;Balance/vestibular training;Functional mobility training;Cognitive remediation/compensation;Community reintegration;Patient/family education;Recreation/leisure participation;Therapeutic activities;Therapeutic exercise;UE/LE Coordination activities;Wheelchair propulsion/positioning  Recommendations for other services: None  Discharge Criteria: Patient will be discharged from TR if patient refuses treatment 3  consecutive times without medical reason.  If treatment goals not met, if there is a change in medical status, if patient makes no progress towards goals or if patient is discharged from hospital.  The above assessment, treatment plan, treatment  alternatives and goals were discussed and mutually agreed upon: by patient  Debbie [name] 11/25/2012, 3:16 PM

## 2012-11-25 NOTE — Progress Notes (Signed)
Occupational Therapy Session Note  Patient Details  Name: Debbie [name] MRN: [id number] Date of Birth: Sep 20, 1976  Today's Date: 11/25/2012 Time: 1110-1140 Time Calculation (min): 30 min  Short Term Goals: Week 1:  OT Short Term Goal 1 (Week 1): Patient will bathe 10/10 body parts with minimal assistance OT Short Term Goal 2 (Week 1): Patient will donn shirt sitting edge of bed with minimal assistance OT Short Term Goal 3 (Week 1): Patient will perform LB dressing with moderate assistance OT Short Term Goal 4 (Week 1): Patient will perform toilet transfer onto drop arm BSC using slide board prn with moderate assistance  Skilled Therapeutic Interventions/Progress Updates:    Pt worked on w/c to mat transfers with SB with close supervision. Pt was moving more cautiously and methodically, taking her time to make sure board and feet were in correct placement. Pt worked on trunk strengthening of supported weight shifts to left, right and posterior to increase trunk control for weight shifting with toileting skills.  Therapy Documentation Precautions:  Precautions Precautions: Fall Precaution Comments: paraplegia Restrictions Weight Bearing Restrictions: No Vital Signs: Therapy Vitals Temp: 97.6 F (36.4 C) Temp src: Oral Pulse Rate: 95  Resp: 20  BP: 133/90 mmHg Patient Position, if appropriate: Sitting Oxygen Therapy SpO2: 98 % Pain: no c/o pain   ADL:  See FIM for current functional status  Therapy/Group: Individual Therapy  Debbie [name] 11/25/2012, 12:23 PM

## 2012-11-25 NOTE — Progress Notes (Addendum)
Occupational Therapy Session Note  Patient Details  Name: MERCEDEES CONVERY MRN: [id number] Date of Birth: [birth year]  Today's Date: 11/25/2012 Time: 1000-1055 Time Calculation (min): 55 min  Short Term Goals: Week 1:  OT Short Term Goal 1 (Week 1): Patient will bathe 10/10 body parts with minimal assistance OT Short Term Goal 2 (Week 1): Patient will donn shirt sitting edge of bed with minimal assistance OT Short Term Goal 3 (Week 1): Patient will perform LB dressing with moderate assistance OT Short Term Goal 4 (Week 1): Patient will perform toilet transfer onto drop arm BSC using slide board prn with moderate assistance  Skilled Therapeutic Interventions/Progress Updates:  Patient found seated in w/c upon entering room. No initial complaints of pain, but patient with complaints of pain around right scapula during movement. Patient propelled self from room -> therapy gym for therapeutic exercise focusing on slide board transfers, w/c management, LE management, dynamic sitting balance/tolerance/endurance, lateral leans, trunk control, trunk strengthening, overall activity tolerance/endurance, and pain management -> right scapula. Patient left seated in w/c at end of session and propelled self back to room.   Precautions:  Precautions Precautions: Fall Precaution Comments: paraplegia Restrictions Weight Bearing Restrictions: No  See FIM for current functional status  Therapy/Group: Individual Therapy  Akire Rennert 11/25/2012, 11:26 AM

## 2012-11-25 NOTE — Progress Notes (Signed)
Recreational Therapy Session Note  Patient Details  Name: JESI JURGENS MRN: [id number] Date of Birth: [birth year] Today's Date: 11/25/2012  Pain: no c/o Skilled Therapeutic Interventions/Progress Updates: Pt propelled w/c from room to the gym using BUE's with supervision.  Pt stood in standing frame ~5 minutes x2 to play Wii--Just Dance.  Pt with c/o of mild dizziness that remained 3-4/10 throughout standing activity, relieved with seated rest break.  Pt initiated seated rest breaks as needed.  Therapy/Group: Co-Treatment   Milfred Krammes 11/25/2012, 3:19 PM

## 2012-11-25 NOTE — Progress Notes (Signed)
Physical Therapy Session Note  Patient Details  Name: Debbie [name] MRN: [id number] Date of Birth: 25-Mar-1976  Today's Date: 11/25/2012 Time: 1300-1400 Time Calculation (min): 60 min  Short Term Goals: Week 1:  PT Short Term Goal 1 (Week 1): Pt will be able to demonstrate dynamic sitting balance with min A during functional tasks PT Short Term Goal 2 (Week 1): Pt will be able to perform bed mobility with min A PT Short Term Goal 3 (Week 1): Pt will be able to demonstrate pressure relief techniques with S when OOB in w/c PT Short Term Goal 4 (Week 1): Pt will be able to mange w/c parts for preparation for transfers with S  Skilled Therapeutic Interventions/Progress Updates:   Pt on toilet upon therapist entering the room; assisted pt with lateral leans and hygiene (total A due to causing increased discomfort in R shoulder) and min A slideboard transfer (with second person present for safety) back into the w/c. Rest of session focused on neuro re-ed to trunk and postural control in standing frame for WB through bilateral LE's while doing Wii Dance using UE's. Able to stay up in standing x 5 min at a time x 2 with seated breaks; unable to take BP in standing (machine would not read x 3 attempts) but BP's noted below seated.   BP = 111/70; HR = 112 BP after standing = 115/73; HR = 99 BP after standing = 114/77; HR = 100  Therapy Documentation Precautions:  Precautions Precautions: Fall Precaution Comments: paraplegia Restrictions Weight Bearing Restrictions: No  Pain: Premedicated - no complaints.  See FIM for current functional status  Therapy/Group: Individual Therapy and Co-Treatment with Recreational Therapy  Karolee Stamps [facility] 11/25/2012, 2:49 PM

## 2012-11-25 NOTE — Progress Notes (Signed)
Patient ID: Debbie [name], female   DOB: 1976-06-20, 36 y.o.   MRN: [id number] Subjective/Complaints:  Increased bowel and bladder sensation but poor control  A 12 point review of systems has been performed and if not noted above is otherwise negative.   Objective: Vital Signs: Blood pressure 130/87, pulse 66, temperature 97.8 F (36.6 C), temperature source Oral, resp. rate 18, height 5\' 8"  (1.727 m), weight 99.6 kg (219 lb 9.3 oz), last menstrual period 11/07/2012, SpO2 98.00%. No results found. No results found for this basename: WBC:2,HGB:2,HCT:2,PLT:2 in the last 72 hours No results found for this basename: NA:2,K:2,CL:2,CO:2,GLUCOSE:2,BUN:2,CREATININE:2,CALCIUM:2 in the last 72 hours CBG (last 3)   Basename 11/24/12 2040 11/24/12 1635 11/24/12 1202  GLUCAP 106* 117* 92    Wt Readings from Last 3 Encounters:  11/22/12 99.6 kg (219 lb 9.3 oz)  11/11/12 95.9 kg (211 lb 6.7 oz)  11/11/12 95.9 kg (211 lb 6.7 oz)    Physical Exam:  Nursing note and vitals reviewed.  Constitutional: She is oriented to person, place, and time. She appears well-developed and well-nourished. obese  Eyes: Pupils are equal, round, and reactive to light.  Neck: Normal range of motion.  Cardiovascular: Normal rate and regular rhythm.  Pulmonary/Chest: Effort normal and breath sounds normal.  Abdominal: Soft. Bowel sounds are normal.  Neurological: She is alert and oriented to person, place, and time. She displays abnormal reflex. No cranial nerve deficit. Coordination abnormal.  Skin: Skin is warm and dry. Wound intact with sutures. No dressing.  Motor: 5/5 in bilateral deltoid, biceps, triceps and grip  1+ knee and hip extensor synergy in the right lower extremity proximally, at the ankle she was 1-2/5 with ADF and APF. LLE is trace proximally with 0/5 distally. She did have general sense to touch but could not differentiate pain/light touch, temp, etc.  Sensation diminished below the xiphoid process   Musc: didn't palpate focal tp today. Pain along medial border of right scapula  Assessment/Plan: 1. 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 Physiatrist is providing close team supervision and 24 hour management of active medical problems listed below. Physiatrist and rehab team continue to assess barriers to discharge/monitor patient progress toward functional and medical goals. FIM: FIM - Bathing Bathing Steps Patient Completed: Chest;Right Arm;Left Arm;Abdomen;Front perineal area;Right upper leg;Left upper leg;Right lower leg (including foot);Left lower leg (including foot) Bathing: 4: Min-Patient completes 8-9 68f 10 parts or 75+ percent  FIM - Upper Body Dressing/Undressing Upper body dressing/undressing steps patient completed: Thread/unthread right bra strap;Thread/unthread left bra strap;Thread/unthread right sleeve of pullover shirt/dresss;Thread/unthread left sleeve of pullover shirt/dress;Put head through opening of pull over shirt/dress;Pull shirt over trunk Upper body dressing/undressing: 4: Min-Patient completed 75 plus % of tasks FIM - Lower Body Dressing/Undressing Lower body dressing/undressing steps patient completed: Thread/unthread right underwear leg;Thread/unthread left underwear leg;Pull underwear up/down;Thread/unthread right pants leg;Thread/unthread left pants leg;Pull pants up/down;Fasten/unfasten pants Lower body dressing/undressing: 4: Min-Patient completed 75 plus % of tasks  FIM - Toileting Toileting steps completed by patient: Performs perineal hygiene Toileting: 0: Activity did not occur  FIM - Diplomatic Services operational officer Devices: Sliding board;Bedside commode (drop arm) Toilet Transfers: 4-To toilet/BSC: Min A (steadying Pt. > 75%);4-From toilet/BSC: Min A (steadying Pt. > 75%) (simulated)  FIM -  Bed/Chair Transfer Bed/Chair Transfer Assistive Devices: Sliding board;Orthosis (leg lift straps) Bed/Chair Transfer: 5: Supine > Sit: Supervision (verbal cues/safety issues);4: Sit >  Supine: Min A (steadying pt. > 75%/lift 1 leg);2: Bed > Chair or W/C: Max A (lift and lower assist)  FIM - Locomotion: Wheelchair Distance: 200 Locomotion: Wheelchair: 6: Travels 150 ft or more, turns around, maneuvers to table, bed or toilet, negotiates 3% grade: maneuvers on rugs and over door sills independently FIM - Locomotion: Ambulation Locomotion: Ambulation: 0: Activity did not occur  Comprehension Comprehension Mode: Auditory Comprehension: 7-Follows complex conversation/direction: With no assist  Expression Expression Mode: Verbal Expression: 7-Expresses complex ideas: With no assist  Social Interaction Social Interaction: 7-Interacts appropriately with others - No medications needed.  Problem Solving Problem Solving: 7-Solves complex problems: Recognizes & self-corrects  Memory Memory: 7-Complete Independence: No helper   Medical Problem List and Plan:  1. 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 Status post T5-T7 laminectomy for resection and debulking of tumor 11/11/2012  2. DVT Prophylaxis/Anticoagulation: Subcutaneous Lovenox. Monitor platelet counts any signs of bleeding  3. Pain Management: Percocet as needed and Naprosyn. Lidoderm patch every 12 hours   -still feel intrascapular pain is myofascial, but it also likely referred from op site.   -try ice AND heat, change robaxin to flexeril, check xrays of t-spine today.   -encouraged her to slow down a bit, use good form, etc 4. Hypertension. Hydrochlorothiazide 12.5 mg daily, lisinopril 40 mg daily. Monitor with increased mobility  5. Neuropsych: This patient is capable of making decisions on his/her own behalf.  6. Tobacco abuse. NicoDerm patch. Provide counseling  7. Neurogenic bladder:  voiding with some residual, using Crede   -continue voiding trial   LOS (Days) 7 A FACE TO FACE EVALUATION WAS PERFORMED  Erick Colace 11/25/2012 8:47 AM

## 2012-11-25 NOTE — Progress Notes (Signed)
Physical Therapy Session Note  Patient Details  Name: Debbie [name] MRN: [id number] Date of Birth: Dec 03, 1976  Today's Date: 11/25/2012 Time: 0930-1000 Time Calculation (min): 30 min  Short Term Goals: Week 1:  PT Short Term Goal 1 (Week 1): Pt will be able to demonstrate dynamic sitting balance with min A during functional tasks PT Short Term Goal 2 (Week 1): Pt will be able to perform bed mobility with min A PT Short Term Goal 3 (Week 1): Pt will be able to demonstrate pressure relief techniques with S when OOB in w/c PT Short Term Goal 4 (Week 1): Pt will be able to mange w/c parts for preparation for transfers with S  Skilled Therapeutic Interventions/Progress Updates:    Pt presents in supine, discussed fall this weekend. Pt stated she did learn to be more cognizant and aware of where LE's were during transfers and lateral leans. Focused on bed mobility, sitting balance EOB, and slide board transfer to w/c with close S/steady A with emphasis on pt managing w/c parts and slideboard with S.   Therapy Documentation Precautions:  Precautions Precautions: Fall Precaution Comments: paraplegia Restrictions Weight Bearing Restrictions: No  Pain:  Premedicated - reports R shoulder/incision area was sore this AM.  See FIM for current functional status  Therapy/Group: Individual Therapy  Karolee Stamps [facility] 11/25/2012, 10:02 AM

## 2012-11-26 ENCOUNTER — Inpatient Hospital Stay (HOSPITAL_COMMUNITY): Payer: Medicaid Other | Admitting: Physical Therapy

## 2012-11-26 ENCOUNTER — Inpatient Hospital Stay (HOSPITAL_COMMUNITY): Payer: Medicaid Other | Admitting: Occupational Therapy

## 2012-11-26 ENCOUNTER — Inpatient Hospital Stay (HOSPITAL_COMMUNITY): Payer: Medicaid Other

## 2012-11-26 LAB — GLUCOSE, CAPILLARY: Glucose-Capillary: 163 mg/dL — ABNORMAL HIGH (ref 70–99)

## 2012-11-26 MED ORDER — CEPHALEXIN 250 MG PO CAPS
250.0000 mg | ORAL_CAPSULE | Freq: Three times a day (TID) | ORAL | Status: DC
  Administered 2012-11-26 – 2012-12-05 (×27): 250 mg via ORAL
  Filled 2012-11-26 (×34): qty 1

## 2012-11-26 NOTE — Progress Notes (Signed)
Physical Therapy Session Note  Patient Details  Name: Debbie [name] MRN: [id number] Date of Birth: 1976/01/11  Today's Date: 11/26/2012 Time: 1400-1430 Time Calculation (min): 30 min  Short Term Goals: Week 2:  PT Short Term Goal 1 (Week 2): = LTGs  Skilled Therapeutic Interventions/Progress Updates:  Pt with questions about floor transfers in case of fall at home. Discussed what to do in case of emergency, demonstrated various techniques for floor to seat transfer, and recommended that at this point if pt were to fall at home calling 911 would be safest option. Pt does not have the upper body strength at this point to safely complete a floor transfer. Practiced scooting back onto the mat into long sitting with preparation to try to lift up onto step/platform but pt fatigued just from scooting back with her UE and agreed that she would not safely be able to attempt a floor transfer at this time. Recommended that we continue strengthening and simulation activities for transfer technique, but to continue to work with HHPT as she progresses in strength. Slide board transfers to/from mat and w/c with S overall.    Therapy Documentation Precautions:  Precautions Precautions: Fall Precaution Comments: paraplegia Restrictions Weight Bearing Restrictions: No    Pain:  Denies pain.  See FIM for current functional status  Therapy/Group: Individual Therapy  Karolee Stamps [facility] 11/26/2012, 2:47 PM

## 2012-11-26 NOTE — Patient Care Conference (Signed)
Inpatient RehabilitationTeam Conference and Plan of Care Update Date: 11/25/2012   Time: 2:15 PM    Patient Name: Debbie [name]      Medical Record Number: [id number]  Date of Birth: 18-Jun-1976 Sex: Female         Room/Bed: 4028/4028-01 Payor Info: Payor: MEDICAID [place]  Plan: MEDICAID [place] ACCESS  Product Type: *No Product type*     Admitting Diagnosis: T7 Lami and Resection  Admit Date/Time:  11/18/2012  2:11 PM Admission Comments: No comment available   Primary Diagnosis:  Paraplegia Principal Problem: Paraplegia  Patient Active Problem List   Diagnosis Date Noted  . Paraplegia 11/18/2012  . 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 Spinal cord compression 11/09/2012  . Lower extremity weakness 11/08/2012  . Hypokalemia 11/08/2012  . HTN (hypertension) 11/08/2012  . Myelopathy 11/08/2012  . Paraparesis of both lower limbs 11/08/2012    Expected Discharge Date: Expected Discharge Date: 12/06/12  Team Members Present: Physician leading conference: Dr. Claudette Laws Social Worker Present: Amada Jupiter, LCSW Nurse Present: Daryll Brod, RN PT Present: Karolee Stamps, PT OT Present: Mackie Pai, OT;Patricia Mat Carne, OT     Current Status/Progress Goal Weekly Team Focus  Medical   thoracic tumor, pain, neurogenic bowel and bladder  Estableish bladder program  D/C anticholinergic meds   Bowel/Bladder   continent of bowels LBM-11/25/12. Requires I/O cath q 6hrs for urinary retention.   pt will remain continent of bowel and will need min assist with emptying bladder.   will continue to monitor for urinary retention.    Swallow/Nutrition/ Hydration             ADL's   overall supervision -> min assist  overall set-up  ADL retraining, ADL transfers, dynamic sitting balance/tolerance/endurance, LE management, pain management, overall activity tolerance/endurance   Mobility   min A transfers, mod I supine to sit but mod A for supine to sit; mod I w/c  mobility, min A sitting balance  mod I w/c level  transfers, sitting balance/trunk control, LE management, activity tolerance,    Communication             Safety/Cognition/ Behavioral Observations            Pain   pt on Vicodin 5-325mg  po one to two tabs q 4 hrs as needed.   <3   will continue to monitor and assess for pain medicate as needed.    Skin   Thoracic incision with suture CDI OTA. Abrasion to right foot OTA and bruise to right lower extremity near calf.  will remain free of further skin breakdown or skin infection.   will continue to monitor and assess incision for infection.     Rehab Goals Patient on target to meet rehab goals: Yes *See Interdisciplinary Assessment and Plan and progress notes for long and short-term goals  Barriers to Discharge: See above    Possible Resolutions to Barriers:  See above    Discharge Planning/Teaching Needs:  home with fiance and family to provide 24/7 assistance      Team Discussion:  Continuing to work on B/B function.  Increasing sensation of need for BM.  Pt very eager and willing to attempt anything ask and, on occasion, can be unsafe.     Revisions to Treatment Plan:  Will trial off of flexeril to see in this decreases urinary retention.  Update safety plan to NOT leave alone in bathroom (safety issue)   Continued Need for Acute Rehabilitation Level of  Care: The patient requires daily medical management by a physician with specialized training in physical medicine and rehabilitation for the following conditions: Daily direction of a multidisciplinary physical rehabilitation program to ensure safe treatment while eliciting the highest outcome that is of practical value to the patient.: Yes Daily medical management of patient stability for increased activity during participation in an intensive rehabilitation regime.: Yes Daily analysis of laboratory values and/or radiology reports with any subsequent need for medication adjustment  of medical intervention for : Neurological problems  Anneke Cundy 11/26/2012, 9:00 AM

## 2012-11-26 NOTE — Progress Notes (Signed)
Physical Therapy Weekly Progress Note  Patient Details  Name: Debbie [name] MRN: [id number] Date of Birth: 1976/08/31  Today's Date: 11/26/2012  Patient has met 3 of 4 short term goals.  Pt still requires up to mod A for sit to supine due to LE management. Pt is very motivated and we have focused on energy conservation and safety as pt wants to do everything. Pt is performing level surface transfers with close S to steady A and progressing towards more uneven transfers. Pt is mod I with w/c mobility on unit. Plan to begin family education next week and determine w/c needed for home. Measurements of home have been asked to be taken by pt's fiance and ramp will be constructed prior to d/c for home entry.   Patient continues to demonstrate the following deficits: paraplegia, abnormal tone, impaired balance, decreased activity tolerance and therefore will continue to benefit from skilled PT intervention to enhance overall performance with activity tolerance, balance, postural control, ability to compensate for deficits and coordination.  Patient progressing toward long term goals..  Continue plan of care.  PT Short Term Goals Week 1:  PT Short Term Goal 1 (Week 1): Pt will be able to demonstrate dynamic sitting balance with min A during functional tasks PT Short Term Goal 1 - Progress (Week 1): Met PT Short Term Goal 2 (Week 1): Pt will be able to perform bed mobility with min A PT Short Term Goal 2 - Progress (Week 1): Partly met (supine to sit with S; mod A sit to supine) PT Short Term Goal 3 (Week 1): Pt will be able to demonstrate pressure relief techniques with S when OOB in w/c PT Short Term Goal 3 - Progress (Week 1): Met PT Short Term Goal 4 (Week 1): Pt will be able to mange w/c parts for preparation for transfers with S PT Short Term Goal 4 - Progress (Week 1): Met Week 2:  PT Short Term Goal 1 (Week 2): = LTGs  Skilled Therapeutic Interventions/Progress Updates:  Ambulation/gait  training;Balance/vestibular training;Disease management/prevention;Discharge planning;Community reintegration;DME/adaptive equipment instruction;Functional electrical stimulation;Psychosocial support;Functional mobility training;Patient/family education;Pain management;Neuromuscular re-education;Skin care/wound management;Splinting/orthotics;Stair training;Therapeutic Activities;UE/LE Coordination activities;UE/LE Strength taining/ROM;Therapeutic Exercise;Visual/perceptual remediation/compensation;Wheelchair propulsion/positioning   Therapy Documentation Precautions:  Precautions Precautions: Fall Precaution Comments: paraplegia Restrictions Weight Bearing Restrictions: No   Pain: Pain Assessment Pain Assessment: 0-10 Pain Score: 0-No pain  See FIM for current functional status   Time: [phone] (56 minutes) Therapy/Group: Individual Therapy Treatment focused on slide board transfers with close S from w/c <-> mat and independent setting up w/c and managing slide board and then dynamic sitting balance edge of mat to play Wii Bowling and Tennis with her daughter. Pt able to use 1 UE for remote and kept other hand in lap for balance at an overall S level. Simulated car transfer with slide board with close S (slightly uphill) and discussed recommendation for use of a lower car that is more level with her w/c to make transfer safer and easier. Pt in agreement and also plans to have fiance bring car and practice with real car next week. Handout given to pt to give to fiance in regards to measurements for doorways, bed height, etc for home accessibility.   Karolee Stamps [facility] 11/26/2012, 12:06 PM

## 2012-11-26 NOTE — Progress Notes (Signed)
Occupational Therapy Weekly Progress Note & Session Notes  Patient Details  Name: Debbie [name] MRN: [id number] Date of Birth: 07-18-1976  Today's Date: 11/26/2012  WEEKLY PROGRESS NOTE Patient has met 4 of 4 short term goals.  Patient is making good progress on CIR. Patient is very motivated and performing ADLs at an overall supervision -> minimal assistance level, at bed level (supine). Goals set for an overall set-up/supervision level. Plan to grade tasks to challenge patient during ADL and complete tasks seated edge of bed rather then supine in bed OR at shower level in tub/shower. Estimated d/c date =1/03.   Patient continues to demonstrate the following deficits: increased pain, decreased functional mobility, decreased independence with ADLs/IADLs, decreased overall activity tolerance/endurance, decreased dynamic sitting balance/tolerance/enduranceand. Therefore, patient will continue to benefit from skilled OT intervention to enhance overall performance with BADL, iADL and Reduce care partner burden.  Patient progressing toward long term goals..  Continue plan of care.  OT Short Term Goals Week 1:  OT Short Term Goal 1 (Week 1): Patient will bathe 10/10 body parts with minimal assistance OT Short Term Goal 1 - Progress (Week 1): Met OT Short Term Goal 2 (Week 1): Patient will donn shirt sitting edge of bed with minimal assistance OT Short Term Goal 2 - Progress (Week 1): Met OT Short Term Goal 3 (Week 1): Patient will perform LB dressing with moderate assistance OT Short Term Goal 3 - Progress (Week 1): Met OT Short Term Goal 4 (Week 1): Patient will perform toilet transfer onto drop arm BSC using slide board prn with moderate assistance OT Short Term Goal 4 - Progress (Week 1): Met  Week 2:  OT Short Term Goal 1 (Week 2): STGs = LTGs  Skilled Therapeutic Interventions/Progress Updates:  Balance/vestibular training;Community reintegration;Discharge planning;Disease  mangement/prevention;DME/adaptive equipment instruction;Functional mobility training;Neuromuscular re-education;Pain management;Patient/family education;Psychosocial support;Self Care/advanced ADL retraining;Skin care/wound managment;Splinting/orthotics;Therapeutic Activities;Therapeutic Exercise;UE/LE Strength taining/ROM;UE/LE Coordination activities;Wheelchair propulsion/positioning   Precautions:  Precautions Precautions: Fall Precaution Comments: paraplegia Restrictions Weight Bearing Restrictions: No  See FIM for current functional status  -------------------------------------------------------------------------------------------------------------  SESSION NOTES  Session #1 [id number] - 55 Minutes Individual Therapy No complaints of pain Patient found supine in bed. Patient performed LB bathing & dressing at bed level in supine position. Patient then transferred edge of bed -> w/c using slide board with close supervision to perform UB bathing & dressing at sink level. Focused skilled intervention on bed mobility, UB/LB bathing & dressing, slide board transfer, w/c management tasks, dynamic sitting balance/tolerance/endurance, overall activity tolerance/endurance, and grooming tasks at sink. Patient left seated in w/c to maneuver around room prn.   Session #2 [id number] - 40 Minutes Individual Therapy No complaints of pain Treatment focus on bed mobility in regular bed in ADL apartment, supine -> sit without HOB raised, sitting edge of bed to donn bilateral shoes, slide board transfers w/c <-> edge of bed, opening door from w/c level, w/c propulsion with weight of person, toilet transfer onto elevated toilet seat without use of slide board, lateral leans to pull pants down from waist, and overall activity tolerance/endurance. Patient left seated on toilet seat at end of session with RN present in room.   Edith Lord 11/26/2012, 7:57 AM

## 2012-11-26 NOTE — Plan of Care (Signed)
Problem: SCI BOWEL ELIMINATION Goal: RH STG MANAGE BOWEL WITH ASSISTANCE STG Manage Bowel with medication with min assist  Outcome: Progressing Cath q 6 hr

## 2012-11-26 NOTE — Progress Notes (Signed)
Social Work Patient ID: Debbie [name], female   DOB: 07-20-1976, 36 y.o.   MRN: [id number]  Met yesterday with patient to review team conference. Pt continues to be very optimistic and happy with progress she is making.  Agreeable with continued target for 12/06/12 d/c. Reports fiance and family are making arrangements to spend Xmas with her at hospital.    Amada Jupiter, LCSW

## 2012-11-26 NOTE — Progress Notes (Signed)
Physical Therapy Session Note  Patient Details  Name: Debbie [name] MRN: [id number] Date of Birth: 05/31/1976  Today's Date: 11/26/2012 Time: 0800-0830 Time Calculation (min): 30 min  Short Term Goals: Week 1:  PT Short Term Goal 1 (Week 1): Pt will be able to demonstrate dynamic sitting balance with min A during functional tasks PT Short Term Goal 2 (Week 1): Pt will be able to perform bed mobility with min A PT Short Term Goal 3 (Week 1): Pt will be able to demonstrate pressure relief techniques with S when OOB in w/c PT Short Term Goal 4 (Week 1): Pt will be able to mange w/c parts for preparation for transfers with S  Skilled Therapeutic Interventions/Progress Updates:   Focused on supine ROM/stretching for bilateral LE's in hip, knees, and ankles to increase flexibility to aid with ADLs and functional mobility as well as decrease tone. Tone greater on LLE this AM. Active hip adduction in RLE noted this AM and ankle df/pf still present.   Therapy Documentation Precautions:  Precautions Precautions: Fall Precaution Comments: paraplegia Restrictions Weight Bearing Restrictions: No    Pain: Pain in R shoulder - medication received from RN.  See FIM for current functional status  Therapy/Group: Individual Therapy  Karolee Stamps [facility] 11/26/2012, 8:33 AM

## 2012-11-26 NOTE — Progress Notes (Signed)
Physical Therapy Session Note  Patient Details  Name: Debbie [name] MRN: [id number] Date of Birth: August 31, 1976  Today's Date: 11/26/2012 Time: 4098-1191 Time Calculation (min): 47 min  Short Term Goals: Week 2:  PT Short Term Goal 1 (Week 2): = LTGs  Skilled Therapeutic Interventions/Progress Updates:  This session focused on Standing in the standing frame playing connect 4 with daughter working on one arm prop (alternating arms) to find midline standing posture (pt tends to lean anteriorly) working on WB through bil legs and weight shifting to reach for game pieces with alt arms.  She stood x 10 mins.  Activities in kitchen working on getting around kitchen in [facility] and educating/discussing and trying different tasks that require her to lean forward to get items (get in the refrigerator bottom drawer, bottom cabinet, put silverware away from dishwasher).  Minimal cues needed for different stratagies to make tasks easier.  Transfer to medium height bed with slide board supervision, sit to supine supervision onto real bed, supine to sit supervision (took 2 attempts to get legs positioned to get up successfully, but did not pull on headboard and did not have bed rails to assist).  Transfer bed to [facility] supervision.  Daughter assisted in placing slide board under pt each time and pt was able to correctly cue her where to put the board.    Therapy Documentation Precautions:  Precautions Precautions: Fall Precaution Comments: paraplegia Restrictions Weight Bearing Restrictions: No   Pain: Pain Assessment Pain Assessment: 0-10 Pain Score: 0-No pain  See FIM for current functional status  Therapy/Group: Individual Therapy   11/26/2012, 12:07 PM

## 2012-11-26 NOTE — Progress Notes (Signed)
Patient ID: Debbie [name], female   DOB: Jan 11, 1976, 36 y.o.   MRN: [id number] Subjective/Complaints:  Increased bowel and bladder sensation but poor control  A 12 point review of systems has been performed and if not noted above is otherwise negative.   Objective: Vital Signs: Blood pressure 120/74, pulse 72, temperature 97.9 F (36.6 C), temperature source Oral, resp. rate 20, height 5\' 8"  (1.727 m), weight 99.6 kg (219 lb 9.3 oz), last menstrual period 11/07/2012, SpO2 100.00%. No results found. No results found for this basename: WBC:2,HGB:2,HCT:2,PLT:2 in the last 72 hours No results found for this basename: NA:2,K:2,CL:2,CO:2,GLUCOSE:2,BUN:2,CREATININE:2,CALCIUM:2 in the last 72 hours CBG (last 3)   Basename 11/25/12 2051 11/25/12 1633 11/25/12 1125  GLUCAP 129* 112* 109*    Wt Readings from Last 3 Encounters:  11/22/12 99.6 kg (219 lb 9.3 oz)  11/11/12 95.9 kg (211 lb 6.7 oz)  11/11/12 95.9 kg (211 lb 6.7 oz)    Physical Exam:  Nursing note and vitals reviewed.  Constitutional: She is oriented to person, place, and time. She appears well-developed and well-nourished. obese  Eyes: Pupils are equal, round, and reactive to light.  Neck: Normal range of motion.  Cardiovascular: Normal rate and regular rhythm.  Pulmonary/Chest: Effort normal and breath sounds normal.  Abdominal: Soft. Bowel sounds are normal.  Neurological: She is alert and oriented to person, place, and time. She displays abnormal reflex. No cranial nerve deficit. Coordination abnormal.  Skin: Skin is warm and dry. Wound intact with sutures. No dressing.  Motor: 5/5 in bilateral deltoid, biceps, triceps and grip  1+ knee and hip extensor synergy in the right lower extremity proximally, at the ankle she was 1-2/5 with ADF and APF. LLE is trace proximally with 0/5 distally. She did have general sense to touch but could not differentiate pain/light touch, temp, etc.  Sensation diminished below the xiphoid  process  Musc: didn't palpate focal tp today. Pain along medial border of right scapula  Assessment/Plan: 1. 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 Physiatrist is providing close team supervision and 24 hour management of active medical problems listed below. Physiatrist and rehab team continue to assess barriers to discharge/monitor patient progress toward functional and medical goals. FIM: FIM - Bathing Bathing Steps Patient Completed: Chest;Right Arm;Left Arm;Abdomen;Front perineal area;Right upper leg;Left upper leg;Right lower leg (including foot);Left lower leg (including foot) Bathing: 4: Min-Patient completes 8-9 74f 10 parts or 75+ percent (Supine, sidelying, EOB, )  FIM - Upper Body Dressing/Undressing Upper body dressing/undressing steps patient completed: Thread/unthread right bra strap;Thread/unthread left bra strap;Thread/unthread right sleeve of pullover shirt/dresss;Thread/unthread left sleeve of pullover shirt/dress;Put head through opening of pull over shirt/dress;Pull shirt over trunk;Hook/unhook bra Upper body dressing/undressing: 5: Set-up assist to: Obtain clothing/put away (unsupported sit EOB) FIM - Lower Body Dressing/Undressing Lower body dressing/undressing steps patient completed: Thread/unthread right pants leg;Thread/unthread left pants leg;Don/Doff right sock;Don/Doff left sock;Don/Doff right shoe;Don/Doff left shoe;Fasten/unfasten right shoe;Fasten/unfasten left shoe Lower body dressing/undressing: 4: Min-Patient completed 75 plus % of tasks  FIM - Toileting Toileting steps completed by patient: Performs perineal hygiene Toileting: 0: Activity did not occur  FIM - Diplomatic Services operational officer Devices: Sliding board;Grab bars Toilet Transfers: 4-From toilet/BSC: Min A (steadying Pt. > 75%)  FIM -  Banker Devices: Sliding board;Arm rests;Bed rails;HOB elevated Bed/Chair Transfer: 6: Supine > Sit: No assist;4: Bed > Chair or W/C:  Min A (steadying Pt. > 75%)  FIM - Locomotion: Wheelchair Distance: 200 Locomotion: Wheelchair: 6: Travels 150 ft or more, turns around, maneuvers to table, bed or toilet, negotiates 3% grade: maneuvers on rugs and over door sills independently FIM - Locomotion: Ambulation Locomotion: Ambulation: 0: Activity did not occur  Comprehension Comprehension Mode: Auditory Comprehension: 7-Follows complex conversation/direction: With no assist  Expression Expression Mode: Verbal Expression: 7-Expresses complex ideas: With no assist  Social Interaction Social Interaction: 7-Interacts appropriately with others - No medications needed.  Problem Solving Problem Solving: 7-Solves complex problems: Recognizes & self-corrects  Memory Memory: 7-Complete Independence: No helper   Medical Problem List and Plan:  1. 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 Status post T5-T7 laminectomy for resection and debulking of tumor 11/11/2012  2. DVT Prophylaxis/Anticoagulation: Subcutaneous Lovenox. Monitor platelet counts any signs of bleeding  3. Pain Management: Percocet as needed and Naprosyn. Lidoderm patch every 12 hours   -still feel intrascapular pain is myofascial, but it also likely referred from op site.   -try ice AND heat, change robaxin to flexeril, check xrays of t-spine today.   -encouraged her to slow down a bit, use good form, etc 4. Hypertension. Hydrochlorothiazide 12.5 mg daily, lisinopril 40 mg daily. Monitor with increased mobility  5. Neuropsych: This patient is capable of making decisions on his/her own behalf.  6. Tobacco abuse. NicoDerm patch. Provide counseling  7. Neurogenic bladder: voiding with some residual, using Crede   -continue voiding trial   LOS (Days)  8 A FACE TO FACE EVALUATION WAS PERFORMED  Erick Colace 11/26/2012 8:48 AM

## 2012-11-27 LAB — GLUCOSE, CAPILLARY
Glucose-Capillary: 106 mg/dL — ABNORMAL HIGH (ref 70–99)
Glucose-Capillary: 138 mg/dL — ABNORMAL HIGH (ref 70–99)

## 2012-11-27 NOTE — Progress Notes (Addendum)
Pt voided large amount in BR; scan = 307cc; no I/O at this time, attempt emptying trigger with larger volume.  1200, pt assisted up to BR, voided large amount, scan = 99cc;monitor.   Noted fine red raised papules on chest area, denies itching, monitor.  Carlean Purl 1800:  Pt voided large amount; scanned for 56cc. Dione Housekeeper, RN

## 2012-11-27 NOTE — Progress Notes (Signed)
Patient ID: Debbie [name], female   DOB: 10-28-1976, 36 y.o.   MRN: [id number] Subjective/Complaints:  Moving my R leg a little  A 12 point review of systems has been performed and if not noted above is otherwise negative.   Objective: Vital Signs: Blood pressure 130/77, pulse 89, temperature 98 F (36.7 C), temperature source Oral, resp. rate 18, height 5\' 8"  (1.727 m), weight 99.6 kg (219 lb 9.3 oz), last menstrual period 11/07/2012, SpO2 98.00%. No results found. No results found for this basename: WBC:2,HGB:2,HCT:2,PLT:2 in the last 72 hours No results found for this basename: NA:2,K:2,CL:2,CO:2,GLUCOSE:2,BUN:2,CREATININE:2,CALCIUM:2 in the last 72 hours CBG (last 3)   Basename 11/26/12 2129 11/26/12 1617 11/26/12 1126  GLUCAP 163* 152* 100*    Wt Readings from Last 3 Encounters:  11/22/12 99.6 kg (219 lb 9.3 oz)  11/11/12 95.9 kg (211 lb 6.7 oz)  11/11/12 95.9 kg (211 lb 6.7 oz)    Physical Exam:  Nursing note and vitals reviewed.  Constitutional: She is oriented to person, place, and time. She appears well-developed and well-nourished. obese  Eyes: Pupils are equal, round, and reactive to light.  Neck: Normal range of motion.  Cardiovascular: Normal rate and regular rhythm.  Pulmonary/Chest: Effort normal and breath sounds normal.  Abdominal: Soft. Bowel sounds are normal.  Neurological: She is alert and oriented to person, place, and time. She displays abnormal reflex. No cranial nerve deficit. Coordination abnormal.  Skin: Skin is warm and dry. Wound intact with sutures. No dressing.  Motor: 5/5 in bilateral deltoid, biceps, triceps and grip  1+ knee and hip extensor synergy in the right lower extremity proximally, at the ankle she was 1-2/5 with ADF and APF. LLE is 1/5 R Hip adductor with 0/5 distally. She did have general sense to touch but could not differentiate pain/light touch, temp, etc.  Sensation diminished below the xiphoid process  Musc: didn't palpate focal  tp today. Pain along medial border of right scapula  Assessment/Plan: 1. 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 Physiatrist is providing close team supervision and 24 hour management of active medical problems listed below. Physiatrist and rehab team continue to assess barriers to discharge/monitor patient progress toward functional and medical goals. FIM: FIM - Bathing Bathing Steps Patient Completed: Chest;Right Arm;Left Arm;Abdomen;Front perineal area;Buttocks;Right upper leg;Left upper leg;Right lower leg (including foot);Left lower leg (including foot) Bathing: 4: Steadying assist  FIM - Upper Body Dressing/Undressing Upper body dressing/undressing steps patient completed: Thread/unthread right bra strap;Hook/unhook bra;Thread/unthread left bra strap;Thread/unthread right sleeve of pullover shirt/dresss;Thread/unthread left sleeve of pullover shirt/dress;Put head through opening of pull over shirt/dress;Pull shirt over trunk Upper body dressing/undressing: 5: Set-up assist to: Obtain clothing/put away (at sink level) FIM - Lower Body Dressing/Undressing Lower body dressing/undressing steps patient completed: Thread/unthread right pants leg;Thread/unthread left pants leg;Pull pants up/down;Don/Doff right sock;Don/Doff left sock;Don/Doff right shoe;Don/Doff left shoe;Fasten/unfasten right shoe;Fasten/unfasten left shoe Lower body dressing/undressing: 4: Steadying Assist  FIM - Toileting Toileting steps completed by patient: Performs perineal hygiene Toileting: 0: Activity did not occur  FIM - Diplomatic Services operational officer Devices: Elevated toilet seat Toilet Transfers: 4-To toilet/BSC: Min A (steadying Pt. > 75%)  FIM - Banker Devices: Sliding board;Arm rests Bed/Chair Transfer: 5: Supine  > Sit: Supervision (verbal cues/safety issues);5: Sit > Supine: Supervision (verbal cues/safety issues);5: Bed > Chair or W/C: Supervision (verbal cues/safety issues);5: Chair or W/C >  Bed: Supervision (verbal cues/safety issues)  FIM - Locomotion: Wheelchair Distance: 200 Locomotion: Wheelchair: 1: Total Assistance/staff pushes wheelchair (Pt<25%) (daughter pushing WC today) FIM - Locomotion: Ambulation Locomotion: Ambulation: 0: Activity did not occur  Comprehension Comprehension Mode: Auditory Comprehension: 7-Follows complex conversation/direction: With no assist  Expression Expression Mode: Verbal Expression: 7-Expresses complex ideas: With no assist  Social Interaction Social Interaction: 7-Interacts appropriately with others - No medications needed.  Problem Solving Problem Solving: 7-Solves complex problems: Recognizes & self-corrects  Memory Memory: 7-Complete Independence: No helper   Medical Problem List and Plan:  1. 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 Status post T5-T7 laminectomy for resection and debulking of tumor 11/11/2012  2. DVT Prophylaxis/Anticoagulation: Subcutaneous Lovenox. Monitor platelet counts any signs of bleeding  3. Pain Management: Percocet as needed and Naprosyn. Lidoderm patch every 12 hours   -still feel intrascapular pain is myofascial, but it also likely referred from op site.   -try ice AND heat, change robaxin to flexeril, check xrays of t-spine today.   -encouraged her to slow down a bit, use good form, etc 4. Hypertension. Hydrochlorothiazide 12.5 mg daily, lisinopril 40 mg daily. Monitor with increased mobility  5. Neuropsych: This patient is capable of making decisions on his/her own behalf.  6. Tobacco abuse. NicoDerm patch. Provide counseling  7. Neurogenic bladder: voiding with some residual, using Crede   -continue voiding trial   LOS (Days) 9 A FACE TO FACE EVALUATION WAS  PERFORMED  Erick Colace 11/27/2012 6:56 AM

## 2012-11-28 ENCOUNTER — Inpatient Hospital Stay (HOSPITAL_COMMUNITY): Payer: Medicaid Other

## 2012-11-28 ENCOUNTER — Inpatient Hospital Stay (HOSPITAL_COMMUNITY): Payer: Medicaid Other | Admitting: Occupational Therapy

## 2012-11-28 LAB — GLUCOSE, CAPILLARY: Glucose-Capillary: 122 mg/dL — ABNORMAL HIGH (ref 70–99)

## 2012-11-28 NOTE — Progress Notes (Signed)
Patient ID: Debbie [name], female   DOB: 1976-09-14, 36 y.o.   MRN: [id number] Subjective/Complaints:  Moving my R leg a little better. Pain in back is better.  A 12 point review of systems has been performed and if not noted above is otherwise negative.   Objective: Vital Signs: Blood pressure 121/83, pulse 72, temperature 98 F (36.7 C), temperature source Oral, resp. rate 20, height 5\' 8"  (1.727 m), weight 99.6 kg (219 lb 9.3 oz), last menstrual period 11/07/2012, SpO2 100.00%. No results found. No results found for this basename: WBC:2,HGB:2,HCT:2,PLT:2 in the last 72 hours No results found for this basename: NA:2,K:2,CL:2,CO:2,GLUCOSE:2,BUN:2,CREATININE:2,CALCIUM:2 in the last 72 hours CBG (last 3)   Basename 11/27/12 2123 11/27/12 1609 11/27/12 1112  GLUCAP 138* 126* 106*    Wt Readings from Last 3 Encounters:  11/22/12 99.6 kg (219 lb 9.3 oz)  11/11/12 95.9 kg (211 lb 6.7 oz)  11/11/12 95.9 kg (211 lb 6.7 oz)    Physical Exam:  Nursing note and vitals reviewed.  Constitutional: She is oriented to person, place, and time. She appears well-developed and well-nourished. obese  Eyes: Pupils are equal, round, and reactive to light.  Neck: Normal range of motion.  Cardiovascular: Normal rate and regular rhythm.  Pulmonary/Chest: Effort normal and breath sounds normal.  Abdominal: Soft. Bowel sounds are normal.  Neurological: She is alert and oriented to person, place, and time. She displays abnormal reflex. No cranial nerve deficit. Coordination abnormal.  Skin: Skin is warm and dry. Wound intact with sutures. No dressing.  Motor: 5/5 in bilateral deltoid, biceps, triceps and grip  1+ knee and hip extensor synergy in the right lower extremity proximally, at the ankle she was 1+ to 2/5 with ADF and APF. LLE is 1/5 R Hip adductor with 0/5 distally. She did have general sense to touch but could not differentiate pain/light touch, temp, etc.  Sensation diminished below the xiphoid  process  Musc: didn't palpate focal tp today. Pain along medial border of right scapula  Assessment/Plan: 1. 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 Physiatrist is providing close team supervision and 24 hour management of active medical problems listed below. Physiatrist and rehab team continue to assess barriers to discharge/monitor patient progress toward functional and medical goals. FIM: FIM - Bathing Bathing Steps Patient Completed: Chest;Right Arm;Left Arm;Abdomen;Front perineal area;Buttocks;Right upper leg;Left upper leg;Right lower leg (including foot);Left lower leg (including foot) Bathing: 4: Steadying assist  FIM - Upper Body Dressing/Undressing Upper body dressing/undressing steps patient completed: Thread/unthread right bra strap;Hook/unhook bra;Thread/unthread left bra strap;Thread/unthread right sleeve of pullover shirt/dresss;Thread/unthread left sleeve of pullover shirt/dress;Put head through opening of pull over shirt/dress;Pull shirt over trunk Upper body dressing/undressing: 5: Set-up assist to: Obtain clothing/put away (at sink level) FIM - Lower Body Dressing/Undressing Lower body dressing/undressing steps patient completed: Thread/unthread right pants leg;Thread/unthread left pants leg;Pull pants up/down;Don/Doff right sock;Don/Doff left sock;Don/Doff right shoe;Don/Doff left shoe;Fasten/unfasten right shoe;Fasten/unfasten left shoe Lower body dressing/undressing: 4: Steadying Assist  FIM - Toileting Toileting steps completed by patient: Performs perineal hygiene Toileting: 0: Activity did not occur  FIM - Diplomatic Services operational officer Devices: Psychiatrist Transfers: 4-To toilet/BSC: Min A (steadying Pt. > 75%);4-From toilet/BSC: Min A (steadying Pt. > 75%)  FIM - Event organiser Devices: Sliding board;Arm rests Bed/Chair Transfer: 5: Supine > Sit: Supervision (verbal cues/safety issues);5: Sit > Supine: Supervision (verbal  cues/safety issues);5: Bed > Chair or W/C: Supervision (verbal cues/safety issues);5: Chair or W/C > Bed: Supervision (verbal cues/safety issues)  FIM - Locomotion: Wheelchair Distance: 200 Locomotion: Wheelchair: 1: Total Assistance/staff pushes wheelchair (Pt<25%) (daughter pushing WC today) FIM - Locomotion: Ambulation Locomotion: Ambulation: 0: Activity did not occur  Comprehension Comprehension Mode: Auditory Comprehension: 7-Follows complex conversation/direction: With no assist  Expression Expression Mode: Verbal Expression: 7-Expresses complex ideas: With no assist  Social Interaction Social Interaction: 7-Interacts appropriately with others - No medications needed.  Problem Solving Problem Solving: 7-Solves complex problems: Recognizes & self-corrects  Memory Memory: 7-Complete Independence: No helper   Medical Problem List and Plan:  1. 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 Status post T5-T7 laminectomy for resection and debulking of tumor 11/11/2012  2. DVT Prophylaxis/Anticoagulation: Subcutaneous Lovenox. Monitor platelet counts any signs of bleeding  3. Pain Management: Percocet as needed and Naprosyn. Lidoderm patch every 12 hours   -pain improving. Continue heat, ROM, appropriate posture and technique 4. Hypertension. Hydrochlorothiazide 12.5 mg daily, lisinopril 40 mg daily. Monitor with increased mobility  5. Neuropsych: This patient is capable of making decisions on his/her own behalf.  6. Tobacco abuse. NicoDerm patch. Provide counseling  7. Neurogenic bladder: voiding with some residual, using Crede   -continue voiding trial  -rx UTI   LOS (Days) 10 A FACE TO FACE EVALUATION WAS PERFORMED  SWARTZ,ZACHARY T 11/28/2012 8:07 AM

## 2012-11-28 NOTE — Progress Notes (Signed)
Physical Therapy Session Note  Patient Details  Name: Debbie [name] MRN: [id number] Date of Birth: March 30, 1976  Today's Date: 11/28/2012  Short Term Goals: Week 2:  PT Short Term Goal 1 (Week 2): = LTGs   Sesssion #1: [id number] (57 min) Pain is controlled - reports being very fatigued today due to being up all night back and forth to the [facility]. In gym practiced uneven transfers and discussed concerns of lateral leans and toilet transfers with managing pants. Practiced slideboard placement and lateral leans. Pt with urge to urinate - returned to room and focused on w/c to [facility] transfer and then transfer from [facility] back to bed with focus on pt managing pants with noted difficulty getting pants and underwear back up after toileting. Pt returned back to bed after toileting due to fatigue to rest before next therapy session - required mod A to bring LE's up to supine.    Session #2: Time: 1100-1145 Time Calculation (min): 45 min No complaints of pain. Pt presents on BSC due to urge to urinate again - focused beginning of session on toileting tasks including clothing management and lateral leans as well as uneven transfers with steady A to/from [facility]. Pt very fatigued and required assist for these tasks. Seated in w/c worked on neuro re-ed to trunk holding weighted medicine ball (red) to practice limits of stability and core/trunk activation as able. W/c mobility on unit for overall endurance mod I.  Therapy Documentation Precautions:  Precautions Precautions: Fall Precaution Comments: paraplegia Restrictions Weight Bearing Restrictions: No     See FIM for current functional status  Therapy/Group: Individual Therapy  Karolee Stamps [facility] 11/28/2012, 11:47 AM

## 2012-11-28 NOTE — Progress Notes (Signed)
Occupational Therapy Session Notes  Patient Details  Name: Debbie [name] MRN: [id number] Date of Birth: 01/30/1976  Today's Date: 11/28/2012  Short Term Goals: Week 1:  OT Short Term Goal 1 (Week 1): Patient will bathe 10/10 body parts with minimal assistance OT Short Term Goal 1 - Progress (Week 1): Met OT Short Term Goal 2 (Week 1): Patient will donn shirt sitting edge of bed with minimal assistance OT Short Term Goal 2 - Progress (Week 1): Met OT Short Term Goal 3 (Week 1): Patient will perform LB dressing with moderate assistance OT Short Term Goal 3 - Progress (Week 1): Met OT Short Term Goal 4 (Week 1): Patient will perform toilet transfer onto drop arm BSC using slide board prn with moderate assistance OT Short Term Goal 4 - Progress (Week 1): Met  Week 2:  OT Short Term Goal 1 (Week 2): STGs = LTGs  Skilled Therapeutic Interventions/Progress Updates:   Session #1 [id number] - 40 Minutes Individual Therapy No complaints of pain Patient found supine in bed. Discussed most appropriate way to perform ADL with menstrual cycle and patient performed LB bathing & dressing at bed level. Patient then transferred from edge of bed -> w/c using slide board with close supervision and performed UB bathing & dressing at sink level. Focused skilled intervention on problem solving, bed mobility, UB/LB bathing & dressing, BLE management, slide board transfers, w/c postioning, w/c management, overall activity tolerance/endurance, and dynamic sitting balance/tolerance/endurance. Patient left seated at sink in w/c to finish ADL.   Session #2 [id number] - 45 Minutes Individual Therapy No complaints of pain Patient found seated in w/c upon entering room. Patient set-up w/c independently and transferred w/c -> edge of bed using slide board with close supervision. Patient then transferred edge of bed -> drop arm BSC without use of slide board with close supervision and therapist holding BSC in place.  Patient performed lateral leans to pull pants down from waist and voided. Patient able to perform peri care with set-up assist while on BSC. Patient transferred back to edge of bed using slide board with min assist and performed rest of toileting at bed level with supervision. Patient able to position self in bed independently using bed rails and raising HOB prn. Therapist and patient then wrote list of toileting & toilet transfer steps for nursing so patient can continue to be as independent as possible during toileting needs. Left patient supine in bed at end of session with call bell & phone within reach.   Precautions:  Precautions Precautions: Fall Precaution Comments: paraplegia Restrictions Weight Bearing Restrictions: No  See FIM for current functional status  Dejanae Helser 11/28/2012, 7:50 AM

## 2012-11-29 ENCOUNTER — Inpatient Hospital Stay (HOSPITAL_COMMUNITY): Payer: Medicaid Other | Admitting: Occupational Therapy

## 2012-11-29 ENCOUNTER — Inpatient Hospital Stay (HOSPITAL_COMMUNITY): Payer: Medicaid Other

## 2012-11-29 ENCOUNTER — Inpatient Hospital Stay (HOSPITAL_COMMUNITY): Payer: Medicaid Other | Admitting: *Deleted

## 2012-11-29 DIAGNOSIS — G8220 Paraplegia, unspecified: Secondary | ICD-10-CM

## 2012-11-29 DIAGNOSIS — C720 Malignant neoplasm of spinal cord: Secondary | ICD-10-CM

## 2012-11-29 DIAGNOSIS — N319 Neuromuscular dysfunction of bladder, unspecified: Secondary | ICD-10-CM

## 2012-11-29 LAB — GLUCOSE, CAPILLARY: Glucose-Capillary: 127 mg/dL — ABNORMAL HIGH (ref 70–99)

## 2012-11-29 MED ORDER — OXYBUTYNIN CHLORIDE 5 MG PO TABS
5.0000 mg | ORAL_TABLET | Freq: Two times a day (BID) | ORAL | Status: DC
  Administered 2012-11-29 – 2012-12-04 (×12): 5 mg via ORAL
  Filled 2012-11-29 (×16): qty 1

## 2012-11-29 NOTE — Progress Notes (Signed)
Physical Therapy Session Note  Patient Details  Name: Debbie [name] MRN: [id number] Date of Birth: 07-Nov-1976  Today's Date: 11/29/2012 Time: 1308-6578 Time Calculation (min): 30 min  Short Term Goals: Week 2:  PT Short Term Goal 1 (Week 2): = LTGs  Skilled Therapeutic Interventions/Progress Updates:   Focused session on standing tolerance, postural control, WB through LE's and neuro-re-ed to trunk in standing frame x 7 minutes in standing while completing writing task to reach in front. Improved standing posture today and able to correct (last time tended to weightshift to the L). See below for BP values, tolerated well but pt with incontinent urine episode while in standing (able to feel it happening but not until it already occurred). Returned back to room, transferred back to bed with S and nurse tech to assist with changing clothing/hygiene.   Seated BP = 121/69; HR = 93 Standing BP = 103/69; HR = 115 Seated BP = 121/72; HR = 99 (stand to sit)  Therapy Documentation Precautions:  Precautions Precautions: Fall Precaution Comments: paraplegia Restrictions Weight Bearing Restrictions: No   Pain: No complaints.   See FIM for current functional status  Therapy/Group: Individual Therapy  Karolee Stamps [facility] 11/29/2012, 1:39 PM

## 2012-11-29 NOTE — Progress Notes (Signed)
Physical Therapy Session Note  Patient Details  Name: Debbie [name] MRN: [id number] Date of Birth: 05-18-1976  Today's Date: 11/29/2012 Time: 1100-1158 Time Calculation (min): 58 min  Short Term Goals: Week 2:  PT Short Term Goal 1 (Week 2): = LTGs  Skilled Therapeutic Interventions/Progress Updates:    Treatment focused on household w/c mobility and transfers in ADL apartment. Practiced bed mobility and transfers on soft bed and discussed recommendations for home set up in regards to energy conservation and able to demonstrate LE management to go from sit to supine with S. LE ROM/stretching in supine with some movement noted still in RLE (ankle df/pf and hip adduction) with decreased tone noted today. W/c mobility training to pick up items from floor, navigate obstacles and navigate push and pull doors from w/c with extra time mod I.  Therapy Documentation Precautions:  Precautions Precautions: Fall Precaution Comments: paraplegia Restrictions Weight Bearing Restrictions: No Pain: Pain Assessment Pain Assessment: 0-10 Pain Score:   7 Pain Type: Acute pain Pain Location: Shoulder Pain Orientation: Right Pain Descriptors: Aching;Sore Pain Frequency: Occasional Pain Onset: Gradual Patients Stated Pain Goal: 2 Pain Intervention(s): Medication (See eMAR) (vicodin 2 po)  See FIM for current functional status  Therapy/Group: Individual Therapy  Karolee Stamps [facility] 11/29/2012, 12:25 PM

## 2012-11-29 NOTE — Progress Notes (Signed)
Patient ID: ANALAURA MESSLER, female   DOB: 1976/07/08, 36 y.o.   MRN: [id number] Subjective/Complaints:  Notes rash on chest. Urinary frequency and incontinence, spasms at times associated with this  A 12 point review of systems has been performed and if not noted above is otherwise negative.   Objective: Vital Signs: Blood pressure 117/71, pulse 84, temperature 98.1 F (36.7 C), temperature source Oral, resp. rate 18, height 5\' 8"  (1.727 m), weight 99.6 kg (219 lb 9.3 oz), last menstrual period 11/07/2012, SpO2 99.00%. No results found. No results found for this basename: WBC:2,HGB:2,HCT:2,PLT:2 in the last 72 hours No results found for this basename: NA:2,K:2,CL:2,CO:2,GLUCOSE:2,BUN:2,CREATININE:2,CALCIUM:2 in the last 72 hours CBG (last 3)   Basename 11/28/12 2113 11/28/12 1635 11/28/12 1150  GLUCAP 160* 122* 93    Wt Readings from Last 3 Encounters:  11/22/12 99.6 kg (219 lb 9.3 oz)  11/11/12 95.9 kg (211 lb 6.7 oz)  11/11/12 95.9 kg (211 lb 6.7 oz)    Physical Exam:  Nursing note and vitals reviewed.  Constitutional: She is oriented to person, place, and time. She appears well-developed and well-nourished. obese  Eyes: Pupils are equal, round, and reactive to light.  Neck: Normal range of motion.  Cardiovascular: Normal rate and regular rhythm.  Pulmonary/Chest: Effort normal and breath sounds normal.  Abdominal: Soft. Bowel sounds are normal.  Neurological: She is alert and oriented to person, place, and time. She displays abnormal reflex. No cranial nerve deficit. Coordination abnormal.  Skin: Skin is warm and dry. Wound intact with sutures. No dressing. A few papules over chest  Motor: 5/5 in bilateral deltoid, biceps, triceps and grip  1+ knee and hip extensor synergy in the right lower extremity proximally, at the ankle she was 1+ to 2/5 with ADF and APF. LLE is 1/5 R Hip adductor with 0/5 distally. She did have general sense to touch but could not differentiate  pain/light touch, temp, etc. No resting tone in legs. dtr's 1+ Sensation diminished below the xiphoid process  Musc:  . Pain along medial border of right scapula improved  Assessment/Plan: 1. 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 Physiatrist is providing close team supervision and 24 hour management of active medical problems listed below. Physiatrist and rehab team continue to assess barriers to discharge/monitor patient progress toward functional and medical goals. FIM: FIM - Bathing Bathing Steps Patient Completed: Chest;Right Arm;Left Arm;Abdomen;Right upper leg;Left upper leg;Right lower leg (including foot);Left lower leg (including foot) Bathing: 4: Min-Patient completes 8-9 10f 10 parts or 75+ percent  FIM - Upper Body Dressing/Undressing Upper body dressing/undressing steps patient completed: Thread/unthread right bra strap;Thread/unthread left bra strap;Hook/unhook bra;Thread/unthread right sleeve of pullover shirt/dresss;Thread/unthread left sleeve of pullover shirt/dress;Put head through opening of pull over shirt/dress;Pull shirt over trunk Upper body dressing/undressing: 5: Set-up assist to: Obtain clothing/put away FIM - Lower Body Dressing/Undressing Lower body dressing/undressing steps patient completed: Thread/unthread right pants leg;Pull pants up/down;Don/Doff right sock;Don/Doff left sock;Don/Doff right shoe;Don/Doff left shoe;Fasten/unfasten right shoe;Thread/unthread left pants leg;Fasten/unfasten left shoe Lower body dressing/undressing: 4: Min-Patient completed 75 plus % of tasks  FIM - Toileting Toileting steps completed by patient: Adjust clothing prior to toileting;Performs perineal hygiene;Adjust clothing after toileting Toileting: 5: Supervision: Safety issues/verbal cues  FIM - Archivist Transfers  Assistive Devices: Bedside commode;Sliding board Toilet Transfers: 5-To toilet/BSC: Supervision (verbal cues/safety issues);4-From toilet/BSC: Min A (steadying Pt. > 75%)  FIM -  Bed/Chair Transport planner Devices: Arm rests;Sliding board Bed/Chair Transfer: 6: Supine > Sit: No assist;5: Bed > Chair or W/C: Supervision (verbal cues/safety issues);5: Chair or W/C > Bed: Supervision (verbal cues/safety issues);3: Sit > Supine: Mod A (lifting assist/Pt. 50-74%/lift 2 legs)  FIM - Locomotion: Wheelchair Distance: 200 Locomotion: Wheelchair: 6: Travels 150 ft or more, turns around, maneuvers to table, bed or toilet, negotiates 3% grade: maneuvers on rugs and over door sills independently FIM - Locomotion: Ambulation Locomotion: Ambulation: 0: Activity did not occur  Comprehension Comprehension Mode: Auditory Comprehension: 7-Follows complex conversation/direction: With no assist  Expression Expression Mode: Verbal Expression: 7-Expresses complex ideas: With no assist  Social Interaction Social Interaction: 7-Interacts appropriately with others - No medications needed.  Problem Solving Problem Solving: 7-Solves complex problems: Recognizes & self-corrects  Memory Memory: 7-Complete Independence: No helper   Medical Problem List and Plan:  1. 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 Status post T5-T7 laminectomy for resection and debulking of tumor 11/11/2012  2. DVT Prophylaxis/Anticoagulation: Subcutaneous Lovenox. Monitor platelet counts any signs of bleeding  3. Pain Management: Percocet as needed and Naprosyn. Lidoderm patch every 12 hours   -pain improving. Continue heat, ROM, appropriate posture and technique 4. Hypertension. Hydrochlorothiazide 12.5 mg daily, lisinopril 40 mg daily. Monitor with increased mobility  5. Neuropsych: This patient is capable of making decisions on his/her own behalf.  6. Tobacco abuse.  NicoDerm patch. Provide counseling  7. Neurogenic bladder: voiding with some residual, using Crede   -continue voiding trial  -rx UTI  -scans have been generally low. Will try low dose ditropan to see if this decreases some of frequency and spasms   LOS (Days) 11 A FACE TO FACE EVALUATION WAS PERFORMED  Zakari Couchman T 11/29/2012 7:42 AM

## 2012-11-29 NOTE — Progress Notes (Signed)
Physical Therapy Session Note  Patient Details  Name: Debbie [name] MRN: [id number] Date of Birth: 1976/02/24  Today's Date: 11/29/2012 Time: 14:30-15:03 ( )  Short Term Goals: Week 2:  PT Short Term Goal 1 (Week 2): = LTGs  Skilled Therapeutic Interventions/Progress Updates:  Tx focused on standing tolerance for LE strengthening and weight-bearing. Pt performed WC push-ups x5 for belt placement at standing frame.  Pt was able to tolerate standing 1x69min (stoped to readjust belt placement) and 1x12.47min while performing writing task on mirror for increased balance challenge. Pt able to hold trunk in upright position with decreased forward lean on table. Pt reporting some fluttering/chest tightness with BR and HR as below, lower than normal but WLF. Unable to get additional read on machine in gym, but BP increased to 120/80 back in room.  Pt propelled WC x200' and was able to manage all parts safely and efficiently.      Therapy Documentation Precautions:  Precautions Precautions: Fall Precaution Comments: paraplegia Restrictions Weight Bearing Restrictions: No General:   Vital Signs: Therapy Vitals Pulse Rate: 115  BP: 94/65 mmHg Patient Position, if appropriate: Standing Oxygen Therapy SpO2: 97 % Pain: None  See FIM for current functional status  Therapy/Group: Individual Therapy Clydene Laming, PT, DPT  Eulogio Ditch, Richardson Dopp M 11/29/2012, 2:53 PM

## 2012-11-29 NOTE — Progress Notes (Signed)
Occupational Therapy Session Notes  Patient Details  Name: Debbie [name] MRN: [id number] Date of Birth: 04-18-1976  Today's Date: 11/29/2012  Short Term Goals: Week 1:  OT Short Term Goal 1 (Week 1): Patient will bathe 10/10 body parts with minimal assistance OT Short Term Goal 1 - Progress (Week 1): Met OT Short Term Goal 2 (Week 1): Patient will donn shirt sitting edge of bed with minimal assistance OT Short Term Goal 2 - Progress (Week 1): Met OT Short Term Goal 3 (Week 1): Patient will perform LB dressing with moderate assistance OT Short Term Goal 3 - Progress (Week 1): Met OT Short Term Goal 4 (Week 1): Patient will perform toilet transfer onto drop arm BSC using slide board prn with moderate assistance OT Short Term Goal 4 - Progress (Week 1): Met Week 2:  OT Short Term Goal 1 (Week 2): STGs = LTGs  Skilled Therapeutic Interventions/Progress Updates:   Session #1 [phone] - 60 Minutes Individual Therapy Patient with 3/10 complaints of pain, RN aware Patient found supine in bed. Patient with request to use bathroom. Patient transferred edge of bed -> BSC for toileting. Patient sat on BSC to thread bilateral pants onto feet, pull underwear & pants up to waist, and donn left shoe. Patient required steady assist during ADL and stated she felt safer completing ADL at bed level; problem solving through best/safest way to perform ADL. Patient then transferred back to bed to donn right shoe. Patient transferred edge of bed -> w/c using slide board with close supervision and then sat at sink for UB bathing & dressing. Skilled intervention focusing on dynamic sitting balance/tolerance/endurance, ADL retraining, overall activity tolerance/endurance, problem solving through tasks, BLE management, w/c set-up, and slide board transfers. Patient left seated in w/c to maneuver around room prn.   Session #2 [id number] - 45 Minutes Individual Therapy No complaints of pain Upon entering room  patient found supine in bed. Engaged in bed mobility for patient to sit edge of bed to donn socks. Patient then transferred edge of bed -> w/c using slide board with close supervision. Patient propelled self -> ADL apartment for furniture transfer. Patient transferred -> couch without slide board with close supervision. Patient then transferred back to w/c using slide board with moderate assistance from therapist secondary to incline. Recommend patient place transfer side hand on slide board to assist with transfer and increase independence with transfer. Also focused on UE HEP using theraband; educated exercises, demonstrated exercises, and had patient return demonstrate exercises. Patient propelled self back to room at end of session.   Precautions:  Precautions Precautions: Fall Precaution Comments: paraplegia Restrictions Weight Bearing Restrictions: No  See FIM for current functional status  [name] 11/29/2012, 7:30 AM

## 2012-11-29 NOTE — Progress Notes (Signed)
Nutrition Follow-up  Eating lunch.  Tolerating meals well with adequate intake meals and snacks.  Regular Diet.  Patient stated healthy eating eating habits that she has been following.  Goal to prevent further weight gain or slow weight loss.  Patient remains motivated.  No new weight.  Will monitor prn for needs. Please consult if needed.  Oran Rein, RD, LDN Clinical Inpatient Dietitian Pager:  [phone] Weekend and after hours pager:  [phone]

## 2012-11-30 ENCOUNTER — Inpatient Hospital Stay (HOSPITAL_COMMUNITY): Payer: Medicaid Other

## 2012-11-30 ENCOUNTER — Inpatient Hospital Stay (HOSPITAL_COMMUNITY): Payer: Medicaid Other | Admitting: Occupational Therapy

## 2012-11-30 LAB — GLUCOSE, CAPILLARY
Glucose-Capillary: 113 mg/dL — ABNORMAL HIGH (ref 70–99)
Glucose-Capillary: 115 mg/dL — ABNORMAL HIGH (ref 70–99)

## 2012-11-30 NOTE — Progress Notes (Signed)
Occupational Therapy Session Note  Patient Details  Name: Debbie [name] MRN: [id number] Date of Birth: 1976/03/08  Today's Date: 11/30/2012 Time: 0800-0900 Time Calculation (min): 60 min  Short Term Goals: Week 2:  OT Short Term Goal 1 (Week 2): STGs = LTGs  Skilled Therapeutic Interventions/Progress Updates:    Pt in bed upon arrival and requested to bathe at shower level.  Focus on transfers, safety awareness, controlled transitional movements, dynamic sitting balance, and problem solving.  Recommended long handle sponge to assist with bathing lower legs.  Pt tends to be in a hurry when transferring and required verbal cues to control movements.  Pt required verbal cues to repositioned BLE during transfers.  Pt requested to sit on BSC.  Pt required assistance pulling up pants while seated on BSC but then stated she needed to void again and had to remove pants using lateral leans to get down over hips.  Pt remained on BSC at end of session with daughter at side and call bell within reach.  RN notified.  Therapy Documentation Precautions:  Precautions Precautions: Fall Precaution Comments: paraplegia Restrictions Weight Bearing Restrictions: No General:   Vital Signs:   Pain: Pain Assessment Pain Assessment: 0-10 Pain Score:   9 Pain Type: Acute pain Pain Location: Shoulder Pain Orientation: Right Pain Descriptors: Aching Pain Frequency: Occasional Pain Onset: Gradual Pain Intervention(s): Premedicated by RN prior to therapy session  See FIM for current functional status  Therapy/Group: Individual Therapy  Rich Brave 11/30/2012, 10:04 AM

## 2012-11-30 NOTE — Progress Notes (Signed)
Physical Therapy Session Note  Patient Details  Name: Debbie [name] MRN: [id number] Date of Birth: [birth year]  Today's Date: 11/30/2012 Time: 1330-1400 Time Calculation (min): 30 min  Short Term Goals: Week 2:  PT Short Term Goal 1 (Week 2): = LTGs  Skilled Therapeutic Interventions/Progress Updates:    Transfer OOB with slideboard with S and pt managing w/c parts independently for transfer and for set up in standing frame. In standing frame focused on postural control, standing tolerance (increased to 15 minutes), weightbearing through BLE's, and endurance while playing Connect 4, taking 1 UE off for support. Cues for correcting alignment, tending to lean to the L, but able to self correct with cues 50% of the time.   Therapy Documentation Precautions:  Precautions Precautions: Fall Precaution Comments: paraplegia Restrictions Weight Bearing Restrictions: No General:   Vital Signs:  BP seated = 125/ 74; HR = 108 BP standing = 117/81; HR = 117 Pain: Pain Assessment Pain Score: 0-No pain Faces Pain Scale: No hurt  See FIM for current functional status  Therapy/Group: Individual Therapy  Karolee Stamps [facility] 11/30/2012, 3:12 PM

## 2012-11-30 NOTE — Progress Notes (Signed)
Patient ID: NATORIA ARCHIBALD, female   DOB: [birth year], 36 y.o.   MRN: [id number] Subjective/Complaints:  Still with urgency and incontinence but seemed better yesterday.  A 12 point review of systems has been performed and if not noted above is otherwise negative.   Objective: Vital Signs: Blood pressure 135/77, pulse 108, temperature 98.1 F (36.7 C), temperature source Oral, resp. rate 19, height 5\' 8"  (1.727 m), weight 99.6 kg (219 lb 9.3 oz), last menstrual period 11/07/2012, SpO2 99.00%. No results found. No results found for this basename: WBC:2,HGB:2,HCT:2,PLT:2 in the last 72 hours No results found for this basename: NA:2,K:2,CL:2,CO:2,GLUCOSE:2,BUN:2,CREATININE:2,CALCIUM:2 in the last 72 hours CBG (last 3)   Basename 11/29/12 2113 11/29/12 1655 11/29/12 1150  GLUCAP 150* 101* 105*    Wt Readings from Last 3 Encounters:  11/22/12 99.6 kg (219 lb 9.3 oz)  11/11/12 95.9 kg (211 lb 6.7 oz)  11/11/12 95.9 kg (211 lb 6.7 oz)    Physical Exam:  Nursing note and vitals reviewed.  Constitutional: She is oriented to person, place, and time. She appears well-developed and well-nourished. obese  Eyes: Pupils are equal, round, and reactive to light.  Neck: Normal range of motion.  Cardiovascular: Normal rate and regular rhythm.  Pulmonary/Chest: Effort normal and breath sounds normal.  Abdominal: Soft. Bowel sounds are normal.  Neurological: She is alert and oriented to person, place, and time. She displays abnormal reflex. No cranial nerve deficit. Coordination abnormal.  Skin: Skin is warm and dry. Wound intact with sutures. No dressing. A few papules over chest  Motor: 5/5 in bilateral deltoid, biceps, triceps and grip  1+ knee and hip extensor synergy in the right lower extremity proximally, at the ankle she was 1+ to 2/5 with ADF and APF. LLE is 1/5 R Hip adductor with 0/5 distally. She did have general sense to touch but could not differentiate pain/light touch, temp, etc. No  resting tone in legs. dtr's 1+ Sensation diminished below the xiphoid process  Musc:  . Pain along medial border of right scapula improved  Assessment/Plan: 1. 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 Physiatrist is providing close team supervision and 24 hour management of active medical problems listed below. Physiatrist and rehab team continue to assess barriers to discharge/monitor patient progress toward functional and medical goals. FIM: FIM - Bathing Bathing Steps Patient Completed: Chest;Right Arm;Left Arm;Abdomen;Front perineal area;Buttocks;Right upper leg;Left upper leg;Right lower leg (including foot);Left lower leg (including foot) Bathing: 4: Steadying assist  FIM - Upper Body Dressing/Undressing Upper body dressing/undressing steps patient completed: Thread/unthread right sleeve of pullover shirt/dresss;Thread/unthread left sleeve of pullover shirt/dress;Put head through opening of pull over shirt/dress;Pull shirt over trunk Upper body dressing/undressing: 5: Set-up assist to: Obtain clothing/put away FIM - Lower Body Dressing/Undressing Lower body dressing/undressing steps patient completed: Thread/unthread right underwear leg;Thread/unthread left underwear leg;Pull underwear up/down;Thread/unthread right pants leg;Thread/unthread left pants leg;Pull pants up/down;Don/Doff right sock;Don/Doff left sock;Don/Doff right shoe;Don/Doff left shoe;Fasten/unfasten right shoe;Fasten/unfasten left shoe Lower body dressing/undressing: 4: Steadying Assist (seated edge of bed, on BSC, and in w/c.)  FIM - Toileting Toileting steps completed by patient: Adjust clothing prior to toileting;Performs perineal hygiene Toileting: 3: Mod-Patient completed 2 of 3 steps  FIM - Diplomatic Services operational officer Devices: Engineer, structural Transfers: 4-To toilet/BSC: Min A (steadying Pt. > 75%);4-From toilet/BSC: Min A (steadying Pt. > 75%)  FIM - Press photographer  Assistive Devices: Sliding board;Arm rests Bed/Chair Transfer: 0: Activity did not occur  FIM - Locomotion: Wheelchair Distance: 200 Locomotion: Wheelchair: 6: Travels 150 ft or more, turns around, maneuvers to table, bed or toilet, negotiates 3% grade: maneuvers on rugs and over door sills independently FIM - Locomotion: Ambulation Locomotion: Ambulation: 0: Activity did not occur  Comprehension Comprehension Mode: Auditory Comprehension: 7-Follows complex conversation/direction: With no assist  Expression Expression Mode: Verbal Expression: 7-Expresses complex ideas: With no assist  Social Interaction Social Interaction: 7-Interacts appropriately with others - No medications needed.  Problem Solving Problem Solving: 7-Solves complex problems: Recognizes & self-corrects  Memory Memory: 7-Complete Independence: No helper   Medical Problem List and Plan:  1. 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 Status post T5-T7 laminectomy for resection and debulking of tumor 11/11/2012  2. DVT Prophylaxis/Anticoagulation: Subcutaneous Lovenox. Monitor platelet counts any signs of bleeding  3. Pain Management: Percocet as needed and Naprosyn. Lidoderm patch every 12 hours   -pain improving. Continue heat, ROM, appropriate posture and technique 4. Hypertension. Hydrochlorothiazide 12.5 mg daily, lisinopril 40 mg daily. Monitor with increased mobility  5. Neuropsych: This patient is capable of making decisions on his/her own behalf.  6. Tobacco abuse. NicoDerm patch. Provide counseling  7. Neurogenic bladder: voiding with some residual, using Crede   -continue voiding trial, timed voids  -rx UTI  -ditropan trial for spasms   LOS (Days) 12 A FACE TO FACE EVALUATION WAS  PERFORMED  SWARTZ,ZACHARY T 11/30/2012 7:04 AM

## 2012-11-30 NOTE — Plan of Care (Signed)
Problem: RH SKIN INTEGRITY Goal: RH STG SKIN FREE OF INFECTION/BREAKDOWN Skin free of breakdown/infection with minimal assistance  Outcome: Progressing Pt. Has some redness and skin irritation from Slide Board transfers.  Pt. Verbalizes understanding of proper skin care and prevention.  Problem: RH PAIN MANAGEMENT Goal: RH STG PAIN MANAGED AT OR BELOW PT'S PAIN GOAL Less than 3  Outcome: Progressing Managed with PRN medication (2 Vicodin)

## 2012-11-30 NOTE — Progress Notes (Signed)
Occupational Therapy Session Note  Patient Details  Name: Debbie [name] MRN: [id number] Date of Birth: 1976/10/11  Today's Date: 11/30/2012 Time: 1430-1500 Time Calculation (min): 30 min  Short Term Goals: Week 1:  OT Short Term Goal 1 (Week 1): Patient will bathe 10/10 body parts with minimal assistance OT Short Term Goal 1 - Progress (Week 1): Met OT Short Term Goal 2 (Week 1): Patient will donn shirt sitting edge of bed with minimal assistance OT Short Term Goal 2 - Progress (Week 1): Met OT Short Term Goal 3 (Week 1): Patient will perform LB dressing with moderate assistance OT Short Term Goal 3 - Progress (Week 1): Met OT Short Term Goal 4 (Week 1): Patient will perform toilet transfer onto drop arm BSC using slide board prn with moderate assistance OT Short Term Goal 4 - Progress (Week 1): Met Week 2:  OT Short Term Goal 1 (Week 2): STGs = LTGs  Skilled Therapeutic Interventions/Progress Updates:    1:1 focus on functional mobility in the kitchen at w/c level, obtaining items from different height cabinets, utilization of reacher for reaching items, problem solving how to perform tasks and mobility at w/c level while making mac and cheese. Discussion/ problem solve home setup to increase independence with simple meal prep. Discussed possible outing options  Therapy Documentation Precautions:  Precautions Precautions: Fall Precaution Comments: paraplegia Restrictions Weight Bearing Restrictions: No Pain: Pain Assessment Pain Score: 0-No pain Faces Pain Scale: No hurt  See FIM for current functional status  Therapy/Group: Individual Therapy  Roney Mans [facility] 11/30/2012, 3:33 PM

## 2012-11-30 NOTE — Progress Notes (Signed)
Physical Therapy Session Note  Patient Details  Name: Debbie [name] MRN: [id number] Date of Birth: 06-May-1976  Today's Date: 11/30/2012 Time: 1100-1200 Time Calculation (min): 60 min  Short Term Goals: Week 2:  PT Short Term Goal 1 (Week 2): = LTGs  Skilled Therapeutic Interventions/Progress Updates:    Session focused on uneven transfers to challenge pt with transfers to simulate more home like environment (min A needed), energy conservation techniques with mobility and transfers, and neuro re-ed to trunk with focus on dynamic sitting balance and trunk control seated edge of mat with S/steady A for ball toss outside BOS and reaching for and pitching horseshoes. Education on appropriate hand placement for balance and to utilize surface pt is on (lean down on elbow for stability versus just use LE for balance) for increased stability. Seated strengthening with push up blocks to aid with uneven transfers.   Therapy Documentation Precautions:  Precautions Precautions: Fall Precaution Comments: paraplegia Restrictions Weight Bearing Restrictions: No Pain:  No complaints.   See FIM for current functional status  Therapy/Group: Individual Therapy  Karolee Stamps [facility] 11/30/2012, 12:17 PM

## 2012-12-01 ENCOUNTER — Inpatient Hospital Stay (HOSPITAL_COMMUNITY): Payer: Medicaid Other

## 2012-12-01 ENCOUNTER — Inpatient Hospital Stay (HOSPITAL_COMMUNITY): Payer: Medicaid Other | Admitting: Occupational Therapy

## 2012-12-01 LAB — GLUCOSE, CAPILLARY

## 2012-12-01 NOTE — Progress Notes (Signed)
Physical Therapy Note  Patient Details  Name: Debbie [name] MRN: [id number] Date of Birth: Feb 11, 1976 Today's Date: 12/01/2012  2:00 - 2:30 30 minutes Individual session Patient denies pain. Patient did complain of some discomfort on right side of chest during standing activity.   Treatment focused on standing and transfers. Patient stood in standing frame for 12 minutes while doing activity with UE's to reduce support of arms. Patient transferred wheelchair to bed with min assist to place sliding board and assist to lift both legs into bed due to fatigue. Patient positioned self in bed. Call bell left in reach.    Arelia Longest M 12/01/2012, 3:03 PM

## 2012-12-01 NOTE — Progress Notes (Signed)
Physical Therapy Note  Patient Details  Name: Debbie [name] MRN: [id number] Date of Birth: 12-Dec-1975 Today's Date: 12/01/2012  11:00 - 11:55 55 minutes Individual session Patient denies pain.  Patient found in wheelchair in room. Patient reported feeling "jittery." Treatment focused on sitting balance activities. Patient had dishes to return to kitchen. Patient rinsed dishes and loaded dishwasher from wheelchair. Patient transferred wheelchair to and from mat with min assist to position board and steady equipment during transfer. Patient is independent with wheelchair set up before and after transfer. Patient used Wii tennis to work on dynamic sitting balance. Patient had lots of questions about community reintegration and outing. Discussed outing process and reasons to evaluate accessibility and problem solve in the community setting. Patient mentioned driving in the future and discussed assistive technology options using hand controls.  Patient had deflated cushion accidentally - demonstrated how to reinflate properly. Patient returned to room via wheelchair. Patient is modified independent with wheelchair mobility in controlled setting.   Arelia Longest M 12/01/2012, 12:26 PM

## 2012-12-01 NOTE — Progress Notes (Signed)
Patient ID: Debbie [name], female   DOB: 1976-10-18, 36 y.o.   MRN: [id number] Subjective/Complaints:  Urgency better. Pain improving  A 12 point review of systems has been performed and if not noted above is otherwise negative.   Objective: Vital Signs: Blood pressure 145/84, pulse 79, temperature 97.9 F (36.6 C), temperature source Oral, resp. rate 20, height 5\' 8"  (1.727 m), weight 99.6 kg (219 lb 9.3 oz), last menstrual period 11/07/2012, SpO2 98.00%. No results found. No results found for this basename: WBC:2,HGB:2,HCT:2,PLT:2 in the last 72 hours No results found for this basename: NA:2,K:2,CL:2,CO:2,GLUCOSE:2,BUN:2,CREATININE:2,CALCIUM:2 in the last 72 hours CBG (last 3)   Basename 12/01/12 0726 11/30/12 2056 11/30/12 1717  GLUCAP 109* 128* 115*    Wt Readings from Last 3 Encounters:  11/22/12 99.6 kg (219 lb 9.3 oz)  11/11/12 95.9 kg (211 lb 6.7 oz)  11/11/12 95.9 kg (211 lb 6.7 oz)    Physical Exam:  Nursing note and vitals reviewed.  Constitutional: She is oriented to person, place, and time. She appears well-developed and well-nourished. obese  Eyes: Pupils are equal, round, and reactive to light.  Neck: Normal range of motion.  Cardiovascular: Normal rate and regular rhythm.  Pulmonary/Chest: Effort normal and breath sounds normal.  Abdominal: Soft. Bowel sounds are normal.  Neurological: She is alert and oriented to person, place, and time. She displays abnormal reflex. No cranial nerve deficit. Coordination abnormal.  Skin: Skin is warm and dry. Wound intact with sutures. No dressing. A few papules over chest  Motor: 5/5 in bilateral deltoid, biceps, triceps and grip  1+ knee and hip extensor synergy in the right lower extremity proximally, at the ankle she was 1+ to 2/5 with ADF and APF. LLE is 1/5 R Hip adductor with 0/5 distally. She did have general sense to touch but could not differentiate pain/light touch, temp, etc. No resting tone in legs. dtr's  1+ Sensation diminished below the xiphoid process  Musc:  . Pain along medial border of right scapula improved  Assessment/Plan: 1. 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 Physiatrist is providing close team supervision and 24 hour management of active medical problems listed below. Physiatrist and rehab team continue to assess barriers to discharge/monitor patient progress toward functional and medical goals. FIM: FIM - Bathing Bathing Steps Patient Completed: Chest;Right Arm;Left Arm;Abdomen;Front perineal area;Right upper leg;Left upper leg;Buttocks Bathing: 4: Min-Patient completes 8-9 81f 10 parts or 75+ percent (shower level on tub bench-no AE)  FIM - Upper Body Dressing/Undressing Upper body dressing/undressing steps patient completed: Thread/unthread right bra strap;Thread/unthread left bra strap;Hook/unhook bra;Thread/unthread right sleeve of pullover shirt/dresss;Thread/unthread left sleeve of pullover shirt/dress;Put head through opening of pull over shirt/dress;Pull shirt over trunk Upper body dressing/undressing: 5: Set-up assist to: Obtain clothing/put away FIM - Lower Body Dressing/Undressing Lower body dressing/undressing steps patient completed: Thread/unthread right underwear leg;Thread/unthread left underwear leg;Pull underwear up/down;Thread/unthread right pants leg;Thread/unthread left pants leg;Pull pants up/down;Don/Doff right sock;Don/Doff left sock;Don/Doff right shoe;Don/Doff left shoe;Fasten/unfasten right shoe;Fasten/unfasten left shoe Lower body dressing/undressing: 0: Activity did not occur  FIM - Toileting Toileting steps completed by patient: Adjust clothing prior to toileting;Performs perineal hygiene Toileting: 3: Mod-Patient completed 2 of 3 steps  FIM - Diplomatic Services operational officer Devices: Government social research officer Transfers: 4-To toilet/BSC: Min A (steadying Pt. > 75%)  FIM - Banker Devices: Sliding board Bed/Chair Transfer: 5:  Supine > Sit: Supervision (verbal cues/safety issues);4: Bed > Chair or W/C: Min A (steadying Pt. > 75%)  FIM - Locomotion: Wheelchair Distance: 200 Locomotion: Wheelchair: 6: Travels 150 ft or more, turns around, maneuvers to table, bed or toilet, negotiates 3% grade: maneuvers on rugs and over door sills independently FIM - Locomotion: Ambulation Locomotion: Ambulation: 0: Activity did not occur  Comprehension Comprehension Mode: Auditory Comprehension: 7-Follows complex conversation/direction: With no assist  Expression Expression Mode: Verbal Expression: 7-Expresses complex ideas: With no assist  Social Interaction Social Interaction: 7-Interacts appropriately with others - No medications needed.  Problem Solving Problem Solving: 7-Solves complex problems: Recognizes & self-corrects  Memory Memory: 7-Complete Independence: No helper   Medical Problem List and Plan:  1. 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 Status post T5-T7 laminectomy for resection and debulking of tumor 11/11/2012  2. DVT Prophylaxis/Anticoagulation: Subcutaneous Lovenox. Monitor platelet counts any signs of bleeding  3. Pain Management: Percocet as needed and Naprosyn. Lidoderm patch every 12 hours   -pain improving. Continue heat, ROM, appropriate posture and technique 4. Hypertension. Hydrochlorothiazide 12.5 mg daily, lisinopril 40 mg daily. Monitor with increased mobility  5. Neuropsych: This patient is capable of making decisions on his/her own behalf.  6. Tobacco abuse. NicoDerm patch. Provide counseling  7. Neurogenic bladder: voiding with some residual, using Crede   -continue voiding trial, timed voids  -rx UTI  -ditropan trial for spasms is helping   LOS (Days)  13 A FACE TO FACE EVALUATION WAS PERFORMED  Chariah Bailey T 12/01/2012 7:48 AM

## 2012-12-01 NOTE — Progress Notes (Signed)
Occupational Therapy Session Notes  Patient Details  Name: Debbie [name] MRN: [id number] Date of Birth: 08-Oct-1976  Today's Date: 12/01/2012  Short Term Goals: Week 1:  OT Short Term Goal 1 (Week 1): Patient will bathe 10/10 body parts with minimal assistance OT Short Term Goal 1 - Progress (Week 1): Met OT Short Term Goal 2 (Week 1): Patient will donn shirt sitting edge of bed with minimal assistance OT Short Term Goal 2 - Progress (Week 1): Met OT Short Term Goal 3 (Week 1): Patient will perform LB dressing with moderate assistance OT Short Term Goal 3 - Progress (Week 1): Met OT Short Term Goal 4 (Week 1): Patient will perform toilet transfer onto drop arm BSC using slide board prn with moderate assistance OT Short Term Goal 4 - Progress (Week 1): Met  Week 2:  OT Short Term Goal 1 (Week 2): STGs = LTGs  Skilled Therapeutic Interventions/Progress Updates:   Session #1 0800-0900 - 60 Minutes Individual Therapy Patient with no complaints of pain, but with complaints of being "stiff" Patient found supine in bed. Patient engaged in bed mobility for edge of bed -> w/c slide board transfer. Patient then maneuvered w/c into bathroom for shower stall transfer onto tub transfer bench using slide board with min assist from therapist. Patient then engaged in UB/LB bathing at shower level. Patient transferred for tub transfer bench -> w/c using slide board with mod assist and min verbal cues for correct body mechanics. Patient completed UB dressing seated in w/c, then transferred back to bed for LB dressing at bed level. Focused skilled intervention on slide board transfers, body mechanics during slide board transfers, overall safety, dynamic sitting balance/tolerance/endurance, overall activity tolerance/endurance, UB/LB bathing & dressing, and bed mobility. Patient with complaints of "shakiness" at end of session; RN aware. Left patient seated in w/c with bilateral leg rests donned to maneuver  around room prn.   Session #2 1300-1330 - 30 Minutes Individual Therapy No complaints of pain Patient found supine in bed. Engaged in bed mobility to transfer edge of bed -> w/c using slide board with steady assist from therapist. Patient then engaged in w/c propulsion from room -> 1st floor of hospital. Introduced community bathroom with patient and discussed transfers on/off handicap toilet seats in public setting. Patient then propelled self on uneven surfaces outside. Focused on w/c propulsion on various floor/ground surfaces, overall activity tolerance/endurance, community re-entry education, and UE strengthening through w/c propulsion. Patient independently propelled self back to room once on 4000 unit.   Precautions:  Precautions Precautions: Fall Precaution Comments: paraplegia Restrictions Weight Bearing Restrictions: No  See FIM for current functional status  Susie Pousson 12/01/2012, 7:59 AM

## 2012-12-02 ENCOUNTER — Inpatient Hospital Stay (HOSPITAL_COMMUNITY): Payer: Medicaid Other | Admitting: Occupational Therapy

## 2012-12-02 ENCOUNTER — Inpatient Hospital Stay (HOSPITAL_COMMUNITY): Payer: Medicaid Other

## 2012-12-02 DIAGNOSIS — C720 Malignant neoplasm of spinal cord: Secondary | ICD-10-CM

## 2012-12-02 DIAGNOSIS — G8220 Paraplegia, unspecified: Secondary | ICD-10-CM

## 2012-12-02 DIAGNOSIS — N319 Neuromuscular dysfunction of bladder, unspecified: Secondary | ICD-10-CM

## 2012-12-02 LAB — GLUCOSE, CAPILLARY: Glucose-Capillary: 110 mg/dL — ABNORMAL HIGH (ref 70–99)

## 2012-12-02 NOTE — Progress Notes (Signed)
Physical Therapy Session Note  Patient Details  Name: Debbie [name] MRN: [id number] Date of Birth: Nov 10, 1976  Today's Date: 12/02/2012 Time: 1100-1203 (63 minutes)  Short Term Goals: Week 2:  PT Short Term Goal 1 (Week 2): = LTGs  Skilled Therapeutic Interventions/Progress Updates:   w/c evaluation with Romelle Starcher from [employer] and Mob[employer] for measurements and education on available options for personal w/c for appropriate type, size, cushion for pressure relief, back support for postural control, and light weight option for improved energy conservation. Pt is active in the home and community and will benefit from lightweight chair to be able to do functional tasks and maintain her active role in her family. During evaluation, pt demonstrating various transfers using slideboard and dynamic balance activities seated edge of mat.   Therapy Documentation Precautions:  Precautions Precautions: Fall Precaution Comments: paraplegia Restrictions Weight Bearing Restrictions: No Pain:  Premedicated for pain.  See FIM for current functional status  Therapy/Group: Individual Therapy  Karolee Stamps [facility] 12/02/2012, 12:18 PM

## 2012-12-02 NOTE — Progress Notes (Addendum)
Recreational Therapy Session Note  Patient Details  Name: MIARA EMMINGER MRN: [id number] Date of Birth: 10/23/1976 Today's Date: 12/02/2012 Time:1300-1345 Pain: no c/o Skilled Therapeutic Interventions/Progress Updates: Pt propelled w/c to gym using BUE's with Modi I.  Pt stood in standing frame ~15 minutes to play wii bowling with supervision/set up assist.  Discussion with pt about community reintegration, energy conservation, and sex education.   Therapy/Group: Co-Treatment   Pt participated in animal assisted activity seated w/c level.   Charmagne Buhl 12/02/2012, 2:44 PM

## 2012-12-02 NOTE — Progress Notes (Signed)
Occupational Therapy Session Note  Patient Details  Name: RINDA ROLLYSON MRN: [id number] Date of Birth: 07-31-1976  Today's Date: 12/02/2012 Time: 1345-1420 Time Calculation (min): 35 min  Short Term Goals: Week 2:  OT Short Term Goal 1 (Week 2): STGs = LTGs  Skilled Therapeutic Interventions/Progress Updates:  Patient received in w/c and engaged in slide board transfers w/c><padded tub bench with cut out.  Patient initially tried to transfer onto tub bench without the slide board then performed transfer much better once on slide board.  Patient required vc for transfer technique secondary to she was pushing the w/c away instead of lifting and scooting her bottom.  Patient heavily using grab bars and plans to have then installed in her shower at home.  Therapy Documentation Precautions:  Precautions Precautions: Fall Precaution Comments: paraplegia Restrictions Weight Bearing Restrictions: No Pain: Denies pain  Therapy/Group: Individual Therapy  Sabine Tenenbaum 12/02/2012, 5:41 PM

## 2012-12-02 NOTE — Progress Notes (Signed)
Patient ID: Debbie [name], female   DOB: 06-23-1976, 35 y.o.   MRN: [id number] Subjective/Complaints:  Urgency better. Felt a little shaky yesterday. Wonders if it's meds  A 12 point review of systems has been performed and if not noted above is otherwise negative.   Objective: Vital Signs: Blood pressure 112/70, pulse 69, temperature 97.8 F (36.6 C), temperature source Oral, resp. rate 18, height 5\' 8"  (1.727 m), weight 99.6 kg (219 lb 9.3 oz), last menstrual period 11/07/2012, SpO2 98.00%. No results found. No results found for this basename: WBC:2,HGB:2,HCT:2,PLT:2 in the last 72 hours No results found for this basename: NA:2,K:2,CL:2,CO:2,GLUCOSE:2,BUN:2,CREATININE:2,CALCIUM:2 in the last 72 hours CBG (last 3)   Basename 12/01/12 2108 12/01/12 1109 12/01/12 0726  GLUCAP 105* 127* 109*    Wt Readings from Last 3 Encounters:  11/22/12 99.6 kg (219 lb 9.3 oz)  11/11/12 95.9 kg (211 lb 6.7 oz)  11/11/12 95.9 kg (211 lb 6.7 oz)    Physical Exam:  Nursing note and vitals reviewed.  Constitutional: She is oriented to person, place, and time. She appears well-developed and well-nourished. obese  Eyes: Pupils are equal, round, and reactive to light.  Neck: Normal range of motion.  Cardiovascular: Normal rate and regular rhythm.  Pulmonary/Chest: Effort normal and breath sounds normal.  Abdominal: Soft. Bowel sounds are normal.  Neurological: She is alert and oriented to person, place, and time. She displays abnormal reflex. No cranial nerve deficit. Coordination abnormal.  Skin: Skin is warm and dry. Wound intact with sutures. No dressing. A few papules over chest  Motor: 5/5 in bilateral deltoid, biceps, triceps and grip  1+ knee and hip extensor synergy in the right lower extremity proximally, at the ankle she was 1+ to 2/5 with ADF and APF. LLE is 1/5 R Hip adductor with 0/5 distally. She did have general sense to touch but could not differentiate pain/light touch, temp, etc.  Occasional "catch" in knees with PROM. Wearing PRAFO's. dtr's 1+ Sensation diminished below the xiphoid process  Musc:  . Pain along medial border of right scapula improved  Assessment/Plan: 1. 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 Physiatrist is providing close team supervision and 24 hour management of active medical problems listed below. Physiatrist and rehab team continue to assess barriers to discharge/monitor patient progress toward functional and medical goals. FIM: FIM - Bathing Bathing Steps Patient Completed: Chest;Right Arm;Left Arm;Abdomen;Front perineal area;Buttocks;Right upper leg;Left upper leg;Right lower leg (including foot);Left lower leg (including foot) Bathing: 4: Steadying assist  FIM - Upper Body Dressing/Undressing Upper body dressing/undressing steps patient completed: Thread/unthread right sleeve of pullover shirt/dresss;Thread/unthread left sleeve of pullover shirt/dress;Put head through opening of pull over shirt/dress;Pull shirt over trunk Upper body dressing/undressing: 7: Complete Independence: No helper FIM - Lower Body Dressing/Undressing Lower body dressing/undressing steps patient completed: Thread/unthread right pants leg;Thread/unthread left pants leg;Pull pants up/down;Don/Doff right sock;Don/Doff left sock;Don/Doff right shoe;Don/Doff left shoe Lower body dressing/undressing: 4: Steadying Assist  FIM - Toileting Toileting steps completed by patient: Adjust clothing prior to toileting;Performs perineal hygiene Toileting: 0: Activity did not occur  FIM - Diplomatic Services operational officer Devices: Human resources officer Transfers: 0-Activity did not occur  FIM - Banker Devices: Sliding board Bed/Chair Transfer: 5: Supine > Sit: Supervision  (verbal cues/safety issues);4: Bed > Chair or W/C: Min A (steadying Pt. > 75%);4: Chair or W/C > Bed: Min  A (steadying Pt. > 75%)  FIM - Locomotion: Wheelchair Distance: 200 Locomotion: Wheelchair: 6: Travels 150 ft or more, turns around, maneuvers to table, bed or toilet, negotiates 3% grade: maneuvers on rugs and over door sills independently FIM - Locomotion: Ambulation Locomotion: Ambulation: 0: Activity did not occur  Comprehension Comprehension Mode: Auditory Comprehension: 7-Follows complex conversation/direction: With no assist  Expression Expression Mode: Verbal Expression: 7-Expresses complex ideas: With no assist  Social Interaction Social Interaction: 7-Interacts appropriately with others - No medications needed.  Problem Solving Problem Solving: 7-Solves complex problems: Recognizes & self-corrects  Memory Memory: 7-Complete Independence: No helper   Medical Problem List and Plan:  1. 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 Status post T5-T7 laminectomy for resection and debulking of tumor 11/11/2012  2. DVT Prophylaxis/Anticoagulation: Subcutaneous Lovenox. Monitor platelet counts any signs of bleeding  3. Pain Management: Percocet as needed and Naprosyn. Lidoderm patch every 12 hours   -pain improving. Continue heat, ROM, appropriate posture and technique 4. Hypertension. Hydrochlorothiazide 12.5 mg daily, lisinopril 40 mg daily. Monitor with increased mobility  5. Neuropsych: This patient is capable of making decisions on his/her own behalf.  6. Tobacco abuse. NicoDerm patch. Provide counseling  7. Neurogenic bladder: voiding with some residual, using Crede   -continue voiding trial, timed voids  -rx UTI  -ditropan trial for spasms is helping-may be causing "shakes"--will follow   LOS (Days) 14 A FACE TO FACE EVALUATION WAS PERFORMED  Denilson Salminen T 12/02/2012 7:06 AM

## 2012-12-02 NOTE — Progress Notes (Signed)
Occupational Therapy Session Notes  Patient Details  Name: Debbie [name] MRN: [id number] Date of Birth: 07-30-1976  Today's Date: 12/02/2012  Short Term Goals: Week 1:  OT Short Term Goal 1 (Week 1): Patient will bathe 10/10 body parts with minimal assistance OT Short Term Goal 1 - Progress (Week 1): Met OT Short Term Goal 2 (Week 1): Patient will donn shirt sitting edge of bed with minimal assistance OT Short Term Goal 2 - Progress (Week 1): Met OT Short Term Goal 3 (Week 1): Patient will perform LB dressing with moderate assistance OT Short Term Goal 3 - Progress (Week 1): Met OT Short Term Goal 4 (Week 1): Patient will perform toilet transfer onto drop arm BSC using slide board prn with moderate assistance OT Short Term Goal 4 - Progress (Week 1): Met  Week 2:  OT Short Term Goal 1 (Week 2): STGs = LTGs  Skilled Therapeutic Interventions/Progress Updates:   Session #1 0930-1030 - 60 Minutes Individual Therapy No complaints of pain Patient found supine in bed. Patient engaged in bed mobility for edge of bed -> w/c transfer with min assist. Patient then transferred onto tub bench in shower stall for UB/LB bathing. Patient transferred from tub bench -> w/c. Patient completed UB dressing then transferred w/c -> BSC for toileting needs then [facility] -> edge of bed for LB dressing in bed. All transfers except w/c -> BSC were completed using slide board and required min assist. Focused skilled intervention on transfers, UB/LB bathing & dressing, toileting (clothing management & peri care), and overall activity tolerance/endurance. Patient continues to complain of shakiness and stated she had spouts of crying yesterday, "I cried almost all day yesterday, everything seemed to make me cry". Therapist provided psychosocial support prn. At end of session left patient supine in bed to complete dressing, call bell & phone left within reach.   Session #2 [id number] - 45 Minutes Co-Treatment with  recreational therapy No complaints of pain Patient found seated in w/c. Patient propelled self from room -> therapy gym for therapeutic exercise focusing on standing in standing frame for input to bilateral LEs. Patient played Wii game while standing. Also discussed multiple topics (energy conservation, community re-entry, sex education, etc.). At end of session left patient seated in w/c in gym for next therapy session.    Precautions:  Precautions Precautions: Fall Precaution Comments: paraplegia Restrictions Weight Bearing Restrictions: No  See FIM for current functional status  Dryden Tapley 12/02/2012, 7:52 AM

## 2012-12-03 ENCOUNTER — Inpatient Hospital Stay (HOSPITAL_COMMUNITY): Payer: Medicaid Other | Admitting: *Deleted

## 2012-12-03 ENCOUNTER — Inpatient Hospital Stay (HOSPITAL_COMMUNITY): Payer: Medicaid Other

## 2012-12-03 ENCOUNTER — Inpatient Hospital Stay (HOSPITAL_COMMUNITY): Payer: Medicaid Other | Admitting: Occupational Therapy

## 2012-12-03 LAB — GLUCOSE, CAPILLARY
Glucose-Capillary: 124 mg/dL — ABNORMAL HIGH (ref 70–99)
Glucose-Capillary: 116 mg/dL — ABNORMAL HIGH (ref 70–99)
Glucose-Capillary: 102 mg/dL — ABNORMAL HIGH (ref 70–99)
Glucose-Capillary: 127 mg/dL — ABNORMAL HIGH (ref 70–99)
Glucose-Capillary: 109 mg/dL — ABNORMAL HIGH (ref 70–99)

## 2012-12-03 NOTE — Progress Notes (Signed)
Occupational Therapy Session Note  Patient Details  Name: NOAM FRANZEN MRN: [id number] Date of Birth: [birth year]  Today's Date: 12/03/2012 Time: 1300-1500 Time Calculation (min): 120 min  Short Term Goals: Week 1:  OT Short Term Goal 1 (Week 1): Patient will bathe 10/10 body parts with minimal assistance OT Short Term Goal 1 - Progress (Week 1): Met OT Short Term Goal 2 (Week 1): Patient will donn shirt sitting edge of bed with minimal assistance OT Short Term Goal 2 - Progress (Week 1): Met OT Short Term Goal 3 (Week 1): Patient will perform LB dressing with moderate assistance OT Short Term Goal 3 - Progress (Week 1): Met OT Short Term Goal 4 (Week 1): Patient will perform toilet transfer onto drop arm BSC using slide board prn with moderate assistance OT Short Term Goal 4 - Progress (Week 1): Met Week 2:  OT Short Term Goal 1 (Week 2): STGs = LTGs  Skilled Therapeutic Interventions/Progress Updates:  Co-Treatment with recreational therapy. Treatment emphasis on community re-entry outing -> [employer]. Focusing on energy conservation, overall activity tolerance/enduramce, w/c mobility on uneven surfaces & around obstacles, education regarding toileting & toilet transfers, managing items throughout store, and education to patient and patient's fiance (Josh). See hard copy of outing sheet placed in patient's shadow chart for more information and goals that were set by PT/OT/TR.  Precautions:  Precautions Precautions: Fall Precaution Comments: paraplegia Restrictions Weight Bearing Restrictions: No  See FIM for current functional status  Khiya Friese 12/03/2012, 3:22 PM

## 2012-12-03 NOTE — Progress Notes (Signed)
Physical Therapy Session Note  Patient Details  Name: Debbie [name] MRN: [id number] Date of Birth: 05/11/1976  Today's Date: 12/03/2012 Time: 0830-0930 Time Calculation (min): 60 min  Short Term Goals: Week 2:  PT Short Term Goal 1 (Week 2): = LTGs  Skilled Therapeutic Interventions/Progress Updates:    Treatment focused on education and discussion of goals for upcoming outing today, measurements taken from home (bed height will need to be altered due to height is too high for safe slideboard transfer), and education with pt's fiance on [facility] transfers as pt with urgency during therapy session. Fiance checked off on transfer and demonstrated safe technique. Also discussed with fiance bringing in lower car for Fam ed on Thurs session and during outing today, have pt in her w/c get up next to the [place] to see that the car transfer would most likely be unsuccessful due to height of seat difference between w/c and car.    Therapy Documentation Precautions:  Precautions Precautions: Fall Precaution Comments: paraplegia Restrictions Weight Bearing Restrictions: No  Pain: Denies pain  See FIM for current functional status  Therapy/Group: Individual Therapy  Karolee Stamps [facility] 12/03/2012, 12:05 PM

## 2012-12-03 NOTE — Progress Notes (Signed)
Occupational Therapy Session Note  Patient Details  Name: Debbie [name] MRN: [id number] Date of Birth: 16-Feb-1976  Today's Date: 12/03/2012 Time: 0730-0825 Time Calculation (min): 55 min  Short Term Goals: Week 1:  OT Short Term Goal 1 (Week 1): Patient will bathe 10/10 body parts with minimal assistance OT Short Term Goal 1 - Progress (Week 1): Met OT Short Term Goal 2 (Week 1): Patient will donn shirt sitting edge of bed with minimal assistance OT Short Term Goal 2 - Progress (Week 1): Met OT Short Term Goal 3 (Week 1): Patient will perform LB dressing with moderate assistance OT Short Term Goal 3 - Progress (Week 1): Met OT Short Term Goal 4 (Week 1): Patient will perform toilet transfer onto drop arm BSC using slide board prn with moderate assistance OT Short Term Goal 4 - Progress (Week 1): Met  Week 2:  OT Short Term Goal 1 (Week 2): STGs = LTGs  Skilled Therapeutic Interventions/Progress Updates:  Patient found supine in bed. Patient performed ADL at bed level in order to conserve energy prior to outing -> [employer]. Patient amble to perform UB/LB bathing & dressing at bed level with set-up assistance. Patient transferred edge of bed -> w/c using slide board with supervision, then sat at sink in w/c for grooming tasks. Patient left seated in w/c with bilateral leg rests donned to maneuver around room prn.   Precautions:  Precautions Precautions: Fall Precaution Comments: paraplegia Restrictions Weight Bearing Restrictions: No  See FIM for current functional status  Debbie [name] 12/03/2012, 8:30 AM

## 2012-12-03 NOTE — Progress Notes (Signed)
Patient ID: Debbie [name], female   DOB: [birth year], 35 y.o.   MRN: [id number] Subjective/Complaints: Still with occasional shakes. Better yesterday  A 12 point review of systems has been performed and if not noted above is otherwise negative.   Objective: Vital Signs: Blood pressure 119/72, pulse 71, temperature 98.2 F (36.8 C), temperature source Oral, resp. rate 19, height 5\' 8"  (1.727 m), weight 99.6 kg (219 lb 9.3 oz), last menstrual period 11/07/2012, SpO2 96.00%. No results found. No results found for this basename: WBC:2,HGB:2,HCT:2,PLT:2 in the last 72 hours No results found for this basename: NA:2,K:2,CL:2,CO:2,GLUCOSE:2,BUN:2,CREATININE:2,CALCIUM:2 in the last 72 hours CBG (last 3)   Basename 12/03/12 0725 12/02/12 2141 12/02/12 1638  GLUCAP 107* 102* 110*    Wt Readings from Last 3 Encounters:  11/22/12 99.6 kg (219 lb 9.3 oz)  11/11/12 95.9 kg (211 lb 6.7 oz)  11/11/12 95.9 kg (211 lb 6.7 oz)    Physical Exam:  Nursing note and vitals reviewed.  Constitutional: She is oriented to person, place, and time. She appears well-developed and well-nourished. obese  Eyes: Pupils are equal, round, and reactive to light.  Neck: Normal range of motion.  Cardiovascular: Normal rate and regular rhythm.  Pulmonary/Chest: Effort normal and breath sounds normal.  Abdominal: Soft. Bowel sounds are normal.  Neurological: She is alert and oriented to person, place, and time. She displays abnormal reflex. No cranial nerve deficit. Coordination abnormal.  Skin: Skin is warm and dry. Wound intact with sutures. No dressing. A few papules over chest  Motor: 5/5 in bilateral deltoid, biceps, triceps and grip  1+ knee and hip extensor synergy in the right lower extremity proximally, at the ankle she was 1+ to 2/5 with ADF and APF. LLE is 1/5 R Hip adductor with 0/5 distally. She did have general sense to touch but could not differentiate pain/light touch, temp, etc. Occasional "catch" in knees  with PROM. Wearing PRAFO's. dtr's 1+ Sensation diminished below the xiphoid process  Musc:  . Pain along medial border of right scapula improved  Assessment/Plan: 1. 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 Physiatrist is providing close team supervision and 24 hour management of active medical problems listed below. Physiatrist and rehab team continue to assess barriers to discharge/monitor patient progress toward functional and medical goals.  Outing today. Discussed with patient regarding relaxing, taking time with activities/therapies. FIM: FIM - Bathing Bathing Steps Patient Completed: Chest;Right Arm;Left Arm;Abdomen;Front perineal area;Buttocks;Right upper leg;Left upper leg;Right lower leg (including foot);Left lower leg (including foot) Bathing: 4: Steadying assist  FIM - Upper Body Dressing/Undressing Upper body dressing/undressing steps patient completed: Thread/unthread right sleeve of pullover shirt/dresss;Thread/unthread left sleeve of pullover shirt/dress;Put head through opening of pull over shirt/dress;Pull shirt over trunk Upper body dressing/undressing: 7: Complete Independence: No helper FIM - Lower Body Dressing/Undressing Lower body dressing/undressing steps patient completed: Thread/unthread right pants leg;Thread/unthread left pants leg;Pull pants up/down;Don/Doff right sock;Don/Doff left sock;Don/Doff right shoe;Don/Doff left shoe Lower body dressing/undressing: 4: Steadying Assist  FIM - Toileting Toileting steps completed by patient: Adjust clothing prior to toileting;Performs perineal hygiene;Adjust clothing after toileting Toileting: 4: Steadying assist  FIM - Diplomatic Services operational officer Devices: Human resources officer Transfers: 4-To toilet/BSC: Min A (steadying Pt. > 75%);4-From  toilet/BSC: Min A (steadying Pt. > 75%)  FIM - Banker Devices: Sliding board Bed/Chair Transfer: 6: Supine > Sit: No assist;4: Bed >  Chair or W/C: Min A (steadying Pt. > 75%)  FIM - Locomotion: Wheelchair Distance: 200 Locomotion: Wheelchair: 6: Travels 150 ft or more, turns around, maneuvers to table, bed or toilet, negotiates 3% grade: maneuvers on rugs and over door sills independently FIM - Locomotion: Ambulation Locomotion: Ambulation: 0: Activity did not occur  Comprehension Comprehension Mode: Auditory Comprehension: 7-Follows complex conversation/direction: With no assist  Expression Expression Mode: Verbal Expression: 7-Expresses complex ideas: With no assist  Social Interaction Social Interaction: 7-Interacts appropriately with others - No medications needed.  Problem Solving Problem Solving: 7-Solves complex problems: Recognizes & self-corrects  Memory Memory: 7-Complete Independence: No helper   Medical Problem List and Plan:  1. 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 Status post T5-T7 laminectomy for resection and debulking of tumor 11/11/2012  2. DVT Prophylaxis/Anticoagulation: Subcutaneous Lovenox. Monitor platelet counts any signs of bleeding  3. Pain Management: Percocet as needed and Naprosyn. Lidoderm patch every 12 hours   -pain improving. Continue heat, ROM, appropriate posture and technique 4. Hypertension. Hydrochlorothiazide 12.5 mg daily, lisinopril 40 mg daily. Monitor with increased mobility  5. Neuropsych: This patient is capable of making decisions on his/her own behalf.  6. Tobacco abuse. NicoDerm patch. Provide counseling  7. Neurogenic bladder: voiding with some residual, using Crede   -continue voiding trial, timed voids  -rx UTI  -ditropan trial for spasms is helping-may be causing "shakes" but I think the benefits outweigh any SE.   LOS (Days) 15 A  FACE TO FACE EVALUATION WAS PERFORMED  Debbie [name] T 12/03/2012 7:58 AM

## 2012-12-03 NOTE — Progress Notes (Signed)
Recreational Therapy Session Note  Patient Details  Name: Debbie [name] MRN: [id number] Date of Birth: January 20, 1976 Today's Date: 12/03/2012 Time:  1300-1500 Pain: no c/o Skilled Therapeutic Interventions/Progress Updates: pt participated in community reintegration/outing to [employer] w/c level with Mod I with focus on functional community, identification & negotiation of barriers, energy conservation techniques, accessing public restroom.  Pt's fiance [name] present, educated, & participatory.  See outing goal sheet in shadow chart for further details  Therapy/Group: Community Reintegration  Keyshun Elpers 12/03/2012, 4:04 PM

## 2012-12-04 DIAGNOSIS — N319 Neuromuscular dysfunction of bladder, unspecified: Secondary | ICD-10-CM

## 2012-12-04 DIAGNOSIS — C720 Malignant neoplasm of spinal cord: Secondary | ICD-10-CM

## 2012-12-04 DIAGNOSIS — G8220 Paraplegia, unspecified: Secondary | ICD-10-CM

## 2012-12-04 LAB — GLUCOSE, CAPILLARY
Glucose-Capillary: 121 mg/dL — ABNORMAL HIGH (ref 70–99)
Glucose-Capillary: 83 mg/dL (ref 70–99)

## 2012-12-04 MED ORDER — MAGIC MOUTHWASH
5.0000 mL | Freq: Three times a day (TID) | ORAL | Status: DC
  Administered 2012-12-04 – 2012-12-06 (×7): 5 mL via ORAL
  Filled 2012-12-04 (×12): qty 5

## 2012-12-04 NOTE — Progress Notes (Signed)
Patient ID: Debbie [name], female   DOB: 1976/09/27, 37 y.o.   MRN: [id number] Subjective/Complaints: Sore tongue, dry  A 12 point review of systems has been performed and if not noted above is otherwise negative.   Objective: Vital Signs: Blood pressure 121/93, pulse 68, temperature 97.8 F (36.6 C), temperature source Oral, resp. rate 18, height 5\' 8"  (1.727 m), weight 99.6 kg (219 lb 9.3 oz), last menstrual period 11/07/2012, SpO2 100.00%. No results found. No results found for this basename: WBC:2,HGB:2,HCT:2,PLT:2 in the last 72 hours No results found for this basename: NA:2,K:2,CL:2,CO:2,GLUCOSE:2,BUN:2,CREATININE:2,CALCIUM:2 in the last 72 hours CBG (last 3)   Basename 12/03/12 2240 12/03/12 1639 12/03/12 1146  GLUCAP 109* 199* 127*    Wt Readings from Last 3 Encounters:  11/22/12 99.6 kg (219 lb 9.3 oz)  11/11/12 95.9 kg (211 lb 6.7 oz)  11/11/12 95.9 kg (211 lb 6.7 oz)    Physical Exam:  Nursing note and vitals reviewed.  Constitutional: She is oriented to person, place, and time. She appears well-developed and well-nourished. obese  Eyes: Pupils are equal, round, and reactive to light.  Neck: Normal range of motion.  Cardiovascular: Normal rate and regular rhythm.  Pulmonary/Chest: Effort normal and breath sounds normal.  Abdominal: Soft. Bowel sounds are normal.  Neurological: She is alert and oriented to person, place, and time. She displays abnormal reflex. No cranial nerve deficit. Coordination abnormal.  Skin: Skin is warm and dry. Wound intact with sutures. No dressing. A few papules over chest  Motor: 5/5 in bilateral deltoid, biceps, triceps and grip  1+ knee and hip extensor synergy in the right lower extremity proximally, at the ankle she was 1+ to 2/5 with ADF and APF. LLE is 1/5 R Hip adductor with 0/5 distally. She did have general sense to touch but could not differentiate pain/light touch, temp, etc. Occasional "catch" in knees with PROM. Wearing PRAFO's.  dtr's 1+ Sensation diminished below the xiphoid process  Musc:  . Pain along medial border of right scapula improved  Assessment/Plan: 1. 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 Physiatrist is providing close team supervision and 24 hour management of active medical problems listed below. Physiatrist and rehab team continue to assess barriers to discharge/monitor patient progress toward functional and medical goals.   FIM: FIM - Bathing Bathing Steps Patient Completed: Chest;Right Arm;Left Arm;Abdomen;Front perineal area;Right upper leg;Buttocks;Left upper leg;Right lower leg (including foot);Left lower leg (including foot) Bathing: 5: Set-up assist to: Obtain items  FIM - Upper Body Dressing/Undressing Upper body dressing/undressing steps patient completed: Thread/unthread right bra strap;Thread/unthread left bra strap;Hook/unhook bra;Thread/unthread right sleeve of pullover shirt/dresss;Thread/unthread left sleeve of pullover shirt/dress;Put head through opening of pull over shirt/dress;Pull shirt over trunk Upper body dressing/undressing: 5: Set-up assist to: Obtain clothing/put away FIM - Lower Body Dressing/Undressing Lower body dressing/undressing steps patient completed: Thread/unthread right pants leg;Thread/unthread left pants leg;Pull pants up/down;Don/Doff right sock;Don/Doff left sock;Don/Doff right shoe;Don/Doff left shoe Lower body dressing/undressing: 5: Set-up assist to: Obtain clothing  FIM - Toileting Toileting steps completed by patient: Adjust clothing prior to toileting;Performs perineal hygiene;Adjust clothing after toileting Toileting: 0: Activity did not occur  FIM - Diplomatic Services operational officer Devices: Bedside commode;Sliding board Toilet Transfers: 0-Activity did not occur  FIM - Physiological scientist Devices: Sliding board Bed/Chair Transfer: 5: Supine > Sit: Supervision (verbal cues/safety issues);5: Bed > Chair or W/C: Supervision (  verbal cues/safety issues)  FIM - Locomotion: Wheelchair Distance: 200 Locomotion: Wheelchair: 6: Travels 150 ft or more, turns around, maneuvers to table, bed or toilet, negotiates 3% grade: maneuvers on rugs and over door sills independently FIM - Locomotion: Ambulation Locomotion: Ambulation: 0: Activity did not occur  Comprehension Comprehension Mode: Auditory Comprehension: 7-Follows complex conversation/direction: With no assist  Expression Expression Mode: Verbal Expression: 7-Expresses complex ideas: With no assist  Social Interaction Social Interaction: 7-Interacts appropriately with others - No medications needed.  Problem Solving Problem Solving: 7-Solves complex problems: Recognizes & self-corrects  Memory Memory: 7-Complete Independence: No helper   Medical Problem List and Plan:  1. 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 Status post T5-T7 laminectomy for resection and debulking of tumor 11/11/2012  2. DVT Prophylaxis/Anticoagulation: Subcutaneous Lovenox. Monitor platelet counts any signs of bleeding  3. Pain Management: Percocet as needed and Naprosyn. Lidoderm patch every 12 hours   -pain improving. Continue heat, ROM, appropriate posture and technique 4. Hypertension. Hydrochlorothiazide 12.5 mg daily, lisinopril 40 mg daily. Monitor with increased mobility  5. Neuropsych: This patient is capable of making decisions on his/her own behalf.  6. Tobacco abuse. NicoDerm patch. Provide counseling  7. Neurogenic bladder: voiding with some residual, using Crede   -continue voiding trial, timed voids  -rx UTI  -ditropan trial for spasms is helping-may be causing "shakes" but I think the benefits outweigh any SE. Also may be causing dry mouth   LOS  (Days) 16 A FACE TO FACE EVALUATION WAS PERFORMED  SWARTZ,ZACHARY T 12/04/2012 7:52 AM

## 2012-12-05 ENCOUNTER — Inpatient Hospital Stay (HOSPITAL_COMMUNITY): Payer: Medicaid Other | Admitting: Occupational Therapy

## 2012-12-05 ENCOUNTER — Inpatient Hospital Stay (HOSPITAL_COMMUNITY): Payer: Medicaid Other

## 2012-12-05 LAB — BASIC METABOLIC PANEL
BUN: 25 mg/dL — ABNORMAL HIGH (ref 6–23)
CO2: 28 mEq/L (ref 19–32)
Calcium: 9.7 mg/dL (ref 8.4–10.5)
Chloride: 95 mEq/L — ABNORMAL LOW (ref 96–112)
Creatinine, Ser: 0.52 mg/dL (ref 0.50–1.10)
Glucose, Bld: 87 mg/dL (ref 70–99)

## 2012-12-05 LAB — CBC
HCT: 36.9 % (ref 36.0–46.0)
MCH: 29.7 pg (ref 26.0–34.0)
MCHC: 32 g/dL (ref 30.0–36.0)
MCV: 92.9 fL (ref 78.0–100.0)
Platelets: 236 10*3/uL (ref 150–400)
RDW: 14.6 % (ref 11.5–15.5)
WBC: 14.5 10*3/uL — ABNORMAL HIGH (ref 4.0–10.5)

## 2012-12-05 LAB — GLUCOSE, CAPILLARY
Glucose-Capillary: 145 mg/dL — ABNORMAL HIGH (ref 70–99)
Glucose-Capillary: 105 mg/dL — ABNORMAL HIGH (ref 70–99)

## 2012-12-05 MED ORDER — DEXAMETHASONE 4 MG PO TABS
4.0000 mg | ORAL_TABLET | Freq: Three times a day (TID) | ORAL | Status: DC
  Administered 2012-12-05 – 2012-12-06 (×3): 4 mg via ORAL
  Filled 2012-12-05 (×6): qty 1

## 2012-12-05 NOTE — Progress Notes (Signed)
Recreational Therapy Discharge Summary Patient Details  Name: Debbie [name] MRN: [id number] Date of Birth: Aug 04, 1976 Today's Date: 12/05/2012  Long term goals set: 2  Long term goals met: 2  Comments on progress toward goals: Pt has made excellent progress toward goals and is ready for discharge home Mod I w/c level.  Pt requires extra time to complete tasks, but is able to do so without assist or cueing.  Pt participated in community reintegration at Mod I level on level & slightly unlevel indoor/outdoor surfaces and supervision for unlevel surfaces.     Reasons goals not met: n/a Reasons for discharge: discharge from hospital  Patient/family agrees with progress made and goals achieved: Yes  Chala Gul 12/05/2012, 8:35 AM

## 2012-12-05 NOTE — Progress Notes (Signed)
Social Work Patient ID: Debbie [name], female   DOB: 1976/04/24, 37 y.o.   MRN: [id number] Spoke with patient and fiance on Wed. 12/31 to review team conference.  Both very pleased with progress and family education planned for 12/05/12.  Getting DME and OP follow up arranged.  Khalea Ventura, LCSW

## 2012-12-05 NOTE — Discharge Summary (Signed)
NAMEWHITNIE, [name]                ACCOUNT NO.:  [id number]  MEDICAL RECORD NO.:  [id number]  LOCATION:  4028                         FACILITY:  [facility]  PHYSICIAN:  Ranelle Oyster, M.D.DATE OF BIRTH:  07-Mar-1976  DATE OF ADMISSION:  11/18/2012 DATE OF DISCHARGE:  12/06/2012                              DISCHARGE SUMMARY   DISCHARGE DIAGNOSES: 1. Paraplegia secondary to intradural mass at T5-T6. 2. Neurogenic bowel and bladder. 3. Subcutaneous Lovenox for deep venous thrombosis prophylaxis. 4. Pain management. 5. Hypertension. 6. Tobacco abuse.  This is a 37 year old female with history of hypertension, tobacco abuse who presented to the emergency room on November 07, 2012, with complaints of increasing weakness with numbness of the lower extremities over the past 6 months as well as increasing difficulty with standing and walking with recurrent falls.  A week prior to admission she developed incontinence of urine and bowel with bilateral lower extremity spasms. She was evaluated by Dr. 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  The patient underwent T5-T7 laminectomy for resection and debulking of tumor on November 11, 2012, per Dr. Jordan Likes and initially placed on bedrest.  Maintained on subcutaneous Lovenox for DVT prophylaxis and decadron protocol.  Close monitoring of blood sugars while on steroids.  Physical and occupational therapy ongoing.  She was admitted for comprehensive rehab program.  PAST MEDICAL HISTORY:  See discharge diagnoses.  SOCIAL HISTORY:  She lives with significant other.  One-level home, 2 steps to entry.  FUNCTIONAL HISTORY:  Prior to admission was independent, driving.  She was unemployed.  FUNCTIONAL STATUS: Upon admission to rehab services was normal assist to scoot to the edge of  the bed.  Supervision for wheelchair assistance.  PHYSICAL EXAMINATION:  VITAL SIGNS:  Blood pressure 123/78, pulse 72, temperature 97.9, respirations 16. GENERAL:  This was an alert female, oriented x3, followed full commands. LUNGS:  Clear to auscultation. CARDIAC:  Regular rate and rhythm. ABDOMEN:  Soft, nontender.  Good bowel sounds. EXTREMITIES:  Motor strength 5/5 bilateral deltoids, biceps, triceps, and grip.  2- at knee and hip extensors.  In the right lower extremity proximally at the ankle she was 1 to 2/5 with ankle dorsiflexion and plantar flexion.  Left lower extremity 1 to 2 proximally with trace distally.  REHABILITATION HOSPITAL COURSE:  The patient was admitted to inpatient rehab services with therapies initiated on a 3-hour daily basis consisting of physical therapy, occupational therapy, and rehabilitation nursing.  The following issues were addressed during the patient's rehabilitation stay.  Pertaining to Debbie [name]'s paraplegia secondary to intradural mass, she had undergone T5-T7 laminectomy for resection of debulking of tumor.  She would follow up Neurosurgery, Dr. Jordan Likes.  She was weaned from her steroids.  She remained on subcutaneous Lovenox for DVT prophylaxis on her rehab course.  Pain management with the use of a Lidoderm patch as well as Percocet as needed.  Blood pressure is controlled with hydrochlorothiazide and lisinopril.  No orthostatic changes.  She did have a history of tobacco abuse maintained on the NicoDerm  patch.  Received full counseling in regards to cessation of smoking, it was questionable if she would be compliant with these requests.  Neurogenic bowel and bladder, she was using Crede, voiding with some residuals.  Continue voiding trial and time voids as advised. She had been treated for urinary tract infection.  She was maintained on Ditropan for bladder spasms; however, it was causing some shaking and felt this might be discontinued.   The patient received weekly collaborative interdisciplinary team conferences to discuss estimated length of stay, family teaching, and any barriers to discharge. Continent of bowel and bladder requiring intermittent catheterization every 6 hours.  Overall supervision for minimal assist for activities of daily living.  Minimal assist transfers.  Modified independence supine to sit, but moderate assist for supine to sit.  Modified independent wheelchair mobility.  Minimal assist for sitting balance.  Full family teaching was completed.  She was to be discharged to home with ongoing therapies dictated as per rehab services.  DISCHARGE MEDICATIONS: 1. Hydrocodone 5/325 one to two tablets every 4 hours as needed for     pain. 2. Naprosyn 500 mg t.i.d. as needed. 3. Decadron taper as advised. 4. Hydrochlorothiazide 12.5 mg daily. 5. Lidoderm patch for 12 hours. 6. Lisinopril 40 mg daily. 7. Robaxin 500 mg every 6 hours as needed muscle spasms. 8. NicoDerm patch, taper as advised. 9. Protonix 40 mg daily, discontinue once steroid taper completed.  DIET:  Regular.  SPECIAL INSTRUCTIONS:  The patient would follow up with Dr. Faith Rogue at the outpatient rehab services December 24, 2012, Dr. Kizzie Furnish, medical management, Dr. Julio Sicks, Neurosurgery in 2 weeks, call for appointment.     Debbie [name], P.A.   ______________________________ Ranelle Oyster, M.D.    DA/MEDQ  D:  12/05/2012  T:  12/05/2012  Job:  [id number]  cc:   Sherilyn Cooter A. Pool, M.D. Tylene Fantasia, [place]

## 2012-12-05 NOTE — Progress Notes (Signed)
Recreational Therapy Session Note  Patient Details  Name: Debbie [name] MRN: [id number] Date of Birth: 10/12/1976 Today's Date: 12/05/2012 Time:  1355-1425 Pain: no c/o Skilled Therapeutic Interventions/Progress Updates: Pt performed slide board transfer with supervision w/c-->mat.  Pt sat EOM with focus on dynamic sitting balance and appropriately recognizing limitations when reaching outside BOS with supervision.  Pt Mod I for TR tasks seated w/c level.  Therapy/Group: Co-Treatment   Tajha Sammarco 12/05/2012, 2:26 PM

## 2012-12-05 NOTE — Progress Notes (Signed)
Patient ID: Debbie [name], female   DOB: 1976-01-13, 37 y.o.   MRN: [id number] Subjective/Complaints: Tongue numb, swelling on face, blurred vision  A 12 point review of systems has been performed and if not noted above is otherwise negative.   Objective: Vital Signs: Blood pressure 123/78, pulse 85, temperature 98.2 F (36.8 C), temperature source Oral, resp. rate 18, height 5\' 8"  (1.727 m), weight 99.6 kg (219 lb 9.3 oz), last menstrual period 11/07/2012, SpO2 97.00%. No results found. No results found for this basename: WBC:2,HGB:2,HCT:2,PLT:2 in the last 72 hours No results found for this basename: NA:2,K:2,CL:2,CO:2,GLUCOSE:2,BUN:2,CREATININE:2,CALCIUM:2 in the last 72 hours CBG (last 3)   Basename 12/04/12 2140 12/04/12 1627 12/04/12 1119  GLUCAP 172* 107* 83    Wt Readings from Last 3 Encounters:  11/22/12 99.6 kg (219 lb 9.3 oz)  11/11/12 95.9 kg (211 lb 6.7 oz)  11/11/12 95.9 kg (211 lb 6.7 oz)    Physical Exam:  Nursing note and vitals reviewed.  Constitutional: She is oriented to person, place, and time. She appears well-developed and well-nourished. Obese, face rounder Eyes: Pupils are equal, round, and reactive to light.  Neck: Normal range of motion.  Cardiovascular: Normal rate and regular rhythm.  Pulmonary/Chest: Effort normal and breath sounds normal.  Abdominal: Soft. Bowel sounds are normal.  Neurological: She is alert and oriented to person, place, and time. She displays abnormal reflex. No cranial nerve deficit. Coordination abnormal.  Skin: Skin is warm and dry. Wound intact with sutures. No dressing. A few papules over chest  Motor: 5/5 in bilateral deltoid, biceps, triceps and grip  1+ knee and hip extensor synergy in the right lower extremity proximally, at the ankle she was 1+ to 2/5 with ADF and APF. LLE is 1/5 R Hip adductor with 0/5 distally. She did have general sense to touch but could not differentiate pain/light touch, temp, etc. Tone in legs is 1  to 1+. Wearing PRAFO's. dtr's 2+ Sensation diminished below the xiphoid process  Musc:  . Pain along medial border of right scapula improved  Assessment/Plan: 1. 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 Physiatrist is providing close team supervision and 24 hour management of active medical problems listed below. Physiatrist and rehab team continue to assess barriers to discharge/monitor patient progress toward functional and medical goals.   FIM: FIM - Bathing Bathing Steps Patient Completed: Chest;Right Arm;Left Arm;Abdomen;Front perineal area;Right upper leg;Buttocks;Left upper leg;Right lower leg (including foot);Left lower leg (including foot) Bathing: 5: Set-up assist to: Obtain items  FIM - Upper Body Dressing/Undressing Upper body dressing/undressing steps patient completed: Thread/unthread right bra strap;Thread/unthread left bra strap;Hook/unhook bra;Thread/unthread right sleeve of pullover shirt/dresss;Thread/unthread left sleeve of pullover shirt/dress;Put head through opening of pull over shirt/dress;Pull shirt over trunk Upper body dressing/undressing: 5: Set-up assist to: Obtain clothing/put away FIM - Lower Body Dressing/Undressing Lower body dressing/undressing steps patient completed: Thread/unthread right pants leg;Thread/unthread left pants leg;Pull pants up/down;Don/Doff right sock;Don/Doff left sock;Don/Doff right shoe;Don/Doff left shoe Lower body dressing/undressing: 5: Set-up assist to: Obtain clothing  FIM - Toileting Toileting steps completed by patient: Adjust clothing prior to toileting;Performs perineal hygiene;Adjust clothing after toileting Toileting: 0: Activity did not occur  FIM - Diplomatic Services operational officer Devices: Bedside commode;Sliding board Toilet Transfers: 0-Activity did not occur  FIM -  Banker Devices: Sliding board Bed/Chair Transfer: 5: Supine > Sit: Supervision (verbal cues/safety issues);5:  Bed > Chair or W/C: Supervision (verbal cues/safety issues)  FIM - Locomotion: Wheelchair Distance: 200 Locomotion: Wheelchair: 6: Travels 150 ft or more, turns around, maneuvers to table, bed or toilet, negotiates 3% grade: maneuvers on rugs and over door sills independently FIM - Locomotion: Ambulation Locomotion: Ambulation: 0: Activity did not occur  Comprehension Comprehension Mode: Auditory Comprehension: 7-Follows complex conversation/direction: With no assist  Expression Expression Mode: Verbal Expression: 7-Expresses complex ideas: With no assist  Social Interaction Social Interaction: 7-Interacts appropriately with others - No medications needed.  Problem Solving Problem Solving: 7-Solves complex problems: Recognizes & self-corrects  Memory Memory: 7-Complete Independence: No helper   Medical Problem List and Plan:  1. 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 Status post T5-T7 laminectomy for resection and debulking of tumor 11/11/2012   -wean decadron, causing a lot of swelling/fluid  2. DVT Prophylaxis/Anticoagulation: Subcutaneous Lovenox. Monitor platelet counts any signs of bleeding  3. Pain Management: Percocet as needed and Naprosyn. Lidoderm patch every 12 hours   -pain improving. Continue heat, ROM, appropriate posture and technique 4. Hypertension. Hydrochlorothiazide 12.5 mg daily, lisinopril 40 mg daily. Monitor with increased mobility  5. Neuropsych: This patient is capable of making decisions on his/her own behalf.  6. Tobacco abuse. NicoDerm patch. Provide counseling  7. Neurogenic bladder: voiding with some residual, using Crede   -continue voiding trial, timed voids  -rx UTI (completed)  -ditropan trial for spasms is helping-may be causing "shakes" but I think  the benefits outweigh any SE. Also may be causing dry mouth--hold today   LOS (Days) 17 A FACE TO FACE EVALUATION WAS PERFORMED  SWARTZ,ZACHARY T 12/05/2012 7:02 AM

## 2012-12-05 NOTE — Discharge Summary (Signed)
  Discharge summary job # [id number]

## 2012-12-05 NOTE — Progress Notes (Signed)
Occupational Therapy Session Note & Discharge Summary  Patient Details  Name: TARRA PENCE MRN: [id number] Date of Birth: 11-29-1976  Today's Date: 12/05/2012  DISCHARGE SUMMARY Patient has met 9 of 11 long term goals due to improved activity tolerance, improved balance, postural control, ability to compensate for deficits, improved attention, improved awareness and improved coordination.  Patient to discharge at overall supervision except mod assist for tub/shower transfers and furnitiure transfers using slide board.  Patient's care partner is independent to provide the necessary physical assistance at discharge.    Reasons goals not met: Patient did not meet supervision furniture transfer goal at this time, patient is able to transfer -> couch from w/c, but has a difficult time transferring from couch -> w/c secondary to incline. Patient also did not meet supervision tub/shower transfer secondary to patient requires management of BLEs during actual shower transfer.   Recommendation:  Patient will benefit from ongoing skilled OT services in home health setting to continue to advance functional skills in the area of BADL, iADL and Reduce care partner burden.  Equipment: padded tub bench with cut-out for toileting and tub/shower transfers  Reasons for discharge: treatment goals met and discharge from hospital  Patient/family agrees with progress made and goals achieved: Yes  Precautions/Restrictions  Precautions Precautions: Fall Restrictions Weight Bearing Restrictions: No  Vital Signs Therapy Vitals Temp: 97.9 F (36.6 C) Temp src: Oral Pulse Rate: 72  Resp: 16  BP: 129/84 mmHg Patient Position, if appropriate: Lying Oxygen Therapy SpO2: 96 % O2 Device: None (Room air)  Pain Pain Assessment Pain Assessment: 0-10 Pain Score:   5 Pain Type: Surgical pain Pain Location: Back Pain Orientation: Right Pain Descriptors: Aching Pain Frequency: Intermittent Pain Onset:  Gradual Pain Intervention(s): Medication (See eMAR)  ADL - See FIM  Vision/Perception  Vision - History Baseline Vision: No visual deficits Patient Visual Report: No change from baseline Vision - Assessment Eye Alignment: Within Functional Limits Vision Assessment: Vision not tested Perception Perception: Within Functional Limits Praxis Praxis: Intact   Cognition Overall Cognitive Status: Appears within functional limits for tasks assessed Arousal/Alertness: Awake/alert Orientation Level: Oriented X4 Memory: Appears intact Awareness: Appears intact Problem Solving: Appears intact Safety/Judgment: Appears intact  Sensation Sensation Additional Comments: bilateral UEs appear intact Coordination Gross Motor Movements are Fluid and Coordinated: Yes (BUEs) Fine Motor Movements are Fluid and Coordinated: Yes (BUEs)  Motor - See Discharge Navigator  Mobility  - See Discharge Navigator  Trunk/Postural Assessment  - See Discharge Navigator  Balance - See Discharge Navigator  Extremity/Trunk Assessment RUE Assessment RUE Assessment: Within Functional Limits LUE Assessment LUE Assessment: Within Functional Limits  See FIM for current functional status  ----------------------------------------------------------------------------------------  SESSION NOTES  Session #1 [id number] - 85 Minutes Individual Therapy No complaints of pain Patient found supine in bed. Patient engaged in bed mobility for edge of bed -> w/c slide board transfer. Patient then propelled self from room -> ADL apartment for bathing in tub/shower unit. Patient transferred onto padded tub bench without using slide board with min assist from therapist. Patient then performed UB/LB bathing with supervision. Patient transferred from tub bench -> w/c using slide board with moderate assistance from therapist (therapist managed BLEs). Patient then transferred onto bed in ADL apartment for UB/LB dressing with  set-up assistance from therapist. Plan was to education patient's fiance, Josh during ADL session; however [name] was not present until 0830. When Josh present educated him on furniture transfers/uphill transfers with patient using slide board. Educated provided on safety &  effectiveness of slide board & uphill transfers. Patient left seated in w/c and propelled self back to room at end of session.   Session #2 1355-1420 - 25 Minutes No complaints of pain Co-Treatment with recreational therapist. Patient engaged in therapeutic exercise of dynamic sitting balance/tolerance/endurance, seated on edge of mat in therapy gym throwing ball back and forth with TR. Also focused on transfers using slide board and patient setting up w/c & slide board prn. Patient propelled self back to room at end of session.   Abbygail [place]ite 12/05/2012, 8:54 AM

## 2012-12-05 NOTE — Progress Notes (Signed)
Physical Therapy Discharge Summary  Patient Details  Name: Debbie [name] MRN: [id number] Date of Birth: Apr 13, 1976  Today's Date: 12/05/2012 Time: 6962-9528 Time Calculation (min): 75 min  Treatment session focused on family education with pt's fiance with focus on basic transfers, car transfer (repeated twice with real car transfer), w/c parts breakdown, general safety, recommendations for home environment modifications, bed mobility, HEP for LE stretching/ROM, what to do in case of emergency, and energy conservation techniques. Pt's fiance, Debbie [name], was able to return demonstrate all assistance needed and pt is independent in guiding her care. Both feel prepared for d/c tomorrow and have no further questions.   Patient has met 7 of 7 long term goals due to improved activity tolerance, improved balance, improved postural control, decreased pain and ability to compensate for deficits.  Patient to discharge at a wheelchair level Modified Independent to S.   Patient's care partner is independent to provide the necessary physical assistance at discharge and successfully completed family education..   Recommendation:  Patient will benefit from ongoing skilled PT services in home health setting initially and then OPPT to continue to advance safe functional mobility, address ongoing impairments in balance, abnormal tone, paraplegia, activity tolerance, functional mobility, and minimize fall risk.  Equipment: specialty w/c through [employer]), long slideboard  Reasons for discharge: treatment goals met and discharge from hospital  Patient/family agrees with progress made and goals achieved: Yes  PT Discharge Precautions/Restrictions Precautions Precautions: Fall Precaution Comments: paraplegia Restrictions Weight Bearing Restrictions: No   Pain Pain Assessment Pain Assessment: 0-10 Pain Score: 0-No pain Pain Type: Surgical pain Pain Location: Back Pain Orientation:  Right;Upper Pain Descriptors: Aching Pain Frequency: Intermittent Pain Onset: Gradual Patients Stated Pain Goal: 2 Pain Intervention(s): Medication (See eMAR) Vision/Perception  Vision - History Baseline Vision: No visual deficits Patient Visual Report: No change from baseline Vision - Assessment Eye Alignment: Within Functional Limits Vision Assessment: Vision not tested Perception Perception: Within Functional Limits Praxis Praxis: Intact  Cognition Overall Cognitive Status: Appears within functional limits for tasks assessed Arousal/Alertness: Awake/alert Orientation Level: Oriented X4 Memory: Appears intact Awareness: Appears intact Problem Solving: Appears intact Safety/Judgment: Appears intact Sensation Sensation Light Touch: Impaired Detail Light Touch Impaired Details: Impaired RLE;Impaired LLE Proprioception Impaired Details: Impaired RLE;Impaired LLE Additional Comments: bilateral UEs appear intact Coordination Gross Motor Movements are Fluid and Coordinated: No ((LE's)) Fine Motor Movements are Fluid and Coordinated: Yes (BUEs) Motor  Motor Motor: Abnormal tone;Abnormal postural alignment and control;Paraplegia     Trunk/Postural Assessment  Cervical Assessment Cervical Assessment: Within Functional Limits Thoracic Assessment Thoracic Assessment: Within Functional Limits Lumbar Assessment Lumbar Assessment: Within Functional Limits Postural Control Postural Control: Deficits on evaluation  Balance Static Sitting Balance Static Sitting - Level of Assistance: 6: Modified independent (Device/Increase time) Dynamic Sitting Balance Dynamic Sitting - Level of Assistance: 6: Modified independent (Device/Increase time) Static Standing Balance Static Standing - Level of Assistance: 1: +1 Total assist (with standing frame) Extremity Assessment  RUE Assessment RUE Assessment: Within Functional Limits LUE Assessment LUE Assessment: Within Functional  Limits RLE Assessment RLE Assessment: Exceptions to [facility] RLE Strength RLE Overall Strength Comments: ankle df 2-/5, 2-/5 hip adduction, no other active movement noted RLE Tone RLE Tone: Mild LLE Assessment LLE Assessment: Exceptions to [facility] LLE Strength LLE Overall Strength Comments: no active movement noted LLE Tone LLE Tone: Mild  See FIM for current functional status  Karolee Stamps [facility] 12/05/2012, 12:17 PM

## 2012-12-05 NOTE — Patient Care Conference (Signed)
Inpatient RehabilitationTeam Conference and Plan of Care Update Date: 12/02/12   Time: 2:40 PM    Patient Name: Debbie [name]      Medical Record Number: [id number]  Date of Birth: [birth year] Sex: Female         Room/Bed: 4028/4028-01 Payor Info: Payor: MEDICAID [place]  Plan: MEDICAID [place] ACCESS  Product Type: *No Product type*     Admitting Diagnosis: T7 Lami and Resection  Admit Date/Time:  11/18/2012  2:11 PM Admission Comments: No comment available   Primary Diagnosis:  Paraplegia Principal Problem: Paraplegia  Patient Active Problem List   Diagnosis Date Noted  . Paraplegia 11/18/2012  . 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 Spinal cord compression 11/09/2012  . Lower extremity weakness 11/08/2012  . Hypokalemia 11/08/2012  . HTN (hypertension) 11/08/2012  . Myelopathy 11/08/2012  . Paraparesis of both lower limbs 11/08/2012    Expected Discharge Date: Expected Discharge Date: 12/06/12  Team Members Present: Physician leading conference: Dr. Faith Rogue Social Worker Present: Amada Jupiter, LCSW Nurse Present: Other (comment) Debbie Jakob Rcom, RN) PT Present: Edman Circle, PT OT Present: Ardis Rowan, COTA;Patricia Olean Ree, OT     Current Status/Progress Goal Weekly Team Focus  Medical   pain improving as is bladder function. slow motor recovery in legs. continent of bowel  consistent bladder continence and pain control  timed voiding schedule, posture and technique with mobility, transfers   Bowel/Bladder   continent of bowels          Swallow/Nutrition/ Hydration             ADL's   overall supervision -> min assist (shower level ADLs)  overall set-uip -> supervision  ADL retraining in tub/shower unit, dynamic sitting, transfers, community re-entry, d/c planning, family education   Mobility   S level surface transfers; mod I w/c mobility  mod I w/c level  family education, uneven transfers, real car transfer with pt's fiance     Communication             Safety/Cognition/ Behavioral Observations            Pain             Skin                *See Interdisciplinary Assessment and Plan and progress notes for long and short-term goals  Barriers to Discharge: persistent neuroloical deficits,     Possible Resolutions to Barriers:  custom wheelchair, house remodel    Discharge Planning/Teaching Needs:  Home with fiance and family to provide  24/7 assistance      Team Discussion:  Good gains with B/B.  Continues to over work herself.  W/C eval completed.  Family ed scheduled for Thursday.  No concerns  Revisions to Treatment Plan:  None   Continued Need for Acute Rehabilitation Level of Care: The patient requires daily medical management by a physician with specialized training in physical medicine and rehabilitation for the following conditions: Daily direction of a multidisciplinary physical rehabilitation program to ensure safe treatment while eliciting the highest outcome that is of practical value to the patient.: Yes Daily medical management of patient stability for increased activity during participation in an intensive rehabilitation regime.: Yes Daily analysis of laboratory values and/or radiology reports with any subsequent need for medication adjustment of medical intervention for : Post surgical problems;Neurological problems;Other (urological, pain)  Debbie [name] 12/05/2012, 10:46 AM

## 2012-12-06 DIAGNOSIS — G8220 Paraplegia, unspecified: Secondary | ICD-10-CM

## 2012-12-06 DIAGNOSIS — N319 Neuromuscular dysfunction of bladder, unspecified: Secondary | ICD-10-CM

## 2012-12-06 DIAGNOSIS — C720 Malignant neoplasm of spinal cord: Secondary | ICD-10-CM

## 2012-12-06 LAB — GLUCOSE, CAPILLARY

## 2012-12-06 MED ORDER — NICOTINE 14 MG/24HR TD PT24
14.0000 mg | MEDICATED_PATCH | Freq: Every day | TRANSDERMAL | Status: DC
  Filled 2012-12-06 (×3): qty 1

## 2012-12-06 MED ORDER — HYDROCHLOROTHIAZIDE 12.5 MG PO CAPS: 12.5000 mg | ORAL_CAPSULE | Freq: Every day | ORAL | Status: DC

## 2012-12-06 MED ORDER — LIDOCAINE 5 % EX PTCH: 1.0000 | MEDICATED_PATCH | CUTANEOUS | Status: DC

## 2012-12-06 MED ORDER — DEXAMETHASONE 4 MG PO TABS: 4.0000 mg | ORAL_TABLET | Freq: Three times a day (TID) | ORAL | Status: DC

## 2012-12-06 MED ORDER — NICOTINE 14 MG/24HR TD PT24: 14.0000 | MEDICATED_PATCH | Freq: Every day | TRANSDERMAL | Status: DC

## 2012-12-06 MED ORDER — METHOCARBAMOL 500 MG PO TABS: 500.0000 mg | ORAL_TABLET | Freq: Four times a day (QID) | ORAL | Status: DC | PRN

## 2012-12-06 MED ORDER — SENNA 8.6 MG PO TABS: 2.0000 | ORAL_TABLET | Freq: Every day | ORAL | Status: DC

## 2012-12-06 MED ORDER — PANTOPRAZOLE SODIUM 40 MG PO TBEC: 40.0000 mg | DELAYED_RELEASE_TABLET | Freq: Every day | ORAL | Status: DC

## 2012-12-06 MED ORDER — HYDROCODONE-ACETAMINOPHEN 5-325 MG PO TABS: 1.0000 | ORAL_TABLET | ORAL | Status: DC | PRN

## 2012-12-06 MED ORDER — NAPROXEN 500 MG PO TABS: 500.0000 mg | ORAL_TABLET | Freq: Three times a day (TID) | ORAL | Status: DC | PRN

## 2012-12-06 MED ORDER — LISINOPRIL 40 MG PO TABS: 40.0000 mg | ORAL_TABLET | Freq: Every day | ORAL | Status: DC

## 2012-12-06 NOTE — Progress Notes (Signed)
Social Work  Discharge Note  The overall goal for the admission was met for:   Discharge location: Yes - home with fiance and family providing any needed assistance  Length of Stay: Yes - 18 days  Discharge activity level: Yes - modified independent w/c level  Home/community participation: Yes  Services provided included: MD, RD, PT, OT, RN, TR, Pharmacy, Neuropsych and SW  Financial Services: Medicaid  Follow-up services arranged: Home Health: PT, OT via [facility] and DME: wheelchair and cushion via [employer]; padded tub bench w/ cut out and 30"transfer board via [facility]  Comments (or additional information):  Provided pt and fiance with information on Online support groups via [employer], Vocational Rehab and Independent Living programs.  Patient/Family verbalized understanding of follow-up arrangements: Yes  Individual responsible for coordination of the follow-up plan: patient  Confirmed correct DME delivered: Rosemae Mcquown 12/06/2012    Davanta [employer]user

## 2012-12-06 NOTE — Progress Notes (Signed)
Pt discharged home with husband, Harvel Ricks PA provided discharge instructuins, pt and family verbalized an understanding

## 2012-12-06 NOTE — Progress Notes (Signed)
Patient ID: Debbie [name], female   DOB: 1976/08/04, 37 y.o.   MRN: [id number] Subjective/Complaints: Vision, tongue sx better. Facial swelling decreased a bit. Bladder still functioning reasonably well.  A 12 point review of systems has been performed and if not noted above is otherwise negative.   Objective: Vital Signs: Blood pressure 115/72, pulse 71, temperature 98 F (36.7 C), temperature source Oral, resp. rate 17, height 5\' 8"  (1.727 m), weight 99.6 kg (219 lb 9.3 oz), last menstrual period 11/07/2012, SpO2 97.00%. No results found.  Basename 12/05/12 1247  WBC 14.5*  HGB 11.8*  HCT 36.9  PLT 236    Basename 12/05/12 1247  NA 136  K 3.7  CL 95*  GLUCOSE 87  BUN 25*  CREATININE 0.52  CALCIUM 9.7   CBG (last 3)   Basename 12/05/12 2148 12/05/12 1647 12/05/12 1143  GLUCAP 105* 114* 145*    Wt Readings from Last 3 Encounters:  11/22/12 99.6 kg (219 lb 9.3 oz)  11/11/12 95.9 kg (211 lb 6.7 oz)  11/11/12 95.9 kg (211 lb 6.7 oz)    Physical Exam:  Nursing note and vitals reviewed.  Constitutional: She is oriented to person, place, and time. She appears well-developed and well-nourished. Obese, face rounder Eyes: Pupils are equal, round, and reactive to light.  Neck: Normal range of motion.  Cardiovascular: Normal rate and regular rhythm.  Pulmonary/Chest: Effort normal and breath sounds normal.  Abdominal: Soft. Bowel sounds are normal.  Neurological: She is alert and oriented to person, place, and time. She displays abnormal reflex. No cranial nerve deficit. Coordination abnormal.  Skin: Skin is warm and dry. Wound intact with sutures. No dressing. A few papules over chest  Motor: 5/5 in bilateral deltoid, biceps, triceps and grip  1+ knee and hip extensor synergy in the right lower extremity proximally, at the ankle she was 1+ to 2/5 with ADF and APF. LLE is 1/5 R Hip adductor with 0/5 distally. She did have general sense to touch but could not differentiate  pain/light touch, temp, etc. Tone in legs is 1 to 1+. Wearing PRAFO's. dtr's 2+ Sensation diminished below the xiphoid process  Musc:  . Pain along medial border of right scapula improved  Assessment/Plan: 1. 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 Physiatrist is providing close team supervision and 24 hour management of active medical problems listed below. Physiatrist and rehab team continue to assess barriers to discharge/monitor patient progress toward functional and medical goals.  Patient to dc home today. All questions were answered this am. She will follow up with me in about 4 weeks time.   FIM: FIM - Bathing Bathing Steps Patient Completed: Chest;Right Arm;Left Arm;Abdomen;Front perineal area;Buttocks;Right upper leg;Left upper leg;Right lower leg (including foot);Left lower leg (including foot) Bathing: 5: Supervision: Safety issues/verbal cues  FIM - Upper Body Dressing/Undressing Upper body dressing/undressing steps patient completed: Thread/unthread right bra strap;Thread/unthread right sleeve of pullover shirt/dresss;Hook/unhook bra;Thread/unthread left bra strap;Thread/unthread left sleeve of pullover shirt/dress;Put head through opening of pull over shirt/dress;Pull shirt over trunk Upper body dressing/undressing: 5: Set-up assist to: Obtain clothing/put away FIM - Lower Body Dressing/Undressing Lower body dressing/undressing steps patient completed: Thread/unthread right pants leg;Thread/unthread left pants leg;Pull pants up/down;Don/Doff right sock;Don/Doff left sock;Don/Doff right shoe;Don/Doff left shoe;Fasten/unfasten right shoe;Fasten/unfasten left shoe Lower body dressing/undressing: 5: Set-up assist to: Obtain clothing  FIM - Toileting Toileting steps completed by patient: Adjust clothing  prior to toileting;Performs perineal  hygiene;Adjust clothing after toileting Toileting: 5: Supervision: Safety issues/verbal cues  FIM - Archivist Transfers Assistive Devices: Bedside commode;Sliding board Toilet Transfers: 5-To toilet/BSC: Supervision (verbal cues/safety issues);5-From toilet/BSC: Supervision (verbal cues/safety issues)  FIM - Banker Devices: Sliding board;Arm rests Bed/Chair Transfer: 6: Supine > Sit: No assist;6: Sit > Supine: No assist;5: Bed > Chair or W/C: Supervision (verbal cues/safety issues);5: Chair or W/C > Bed: Supervision (verbal cues/safety issues)  FIM - Locomotion: Wheelchair Distance: 200 Locomotion: Wheelchair: 6: Travels 150 ft or more, turns around, maneuvers to table, bed or toilet, negotiates 3% grade: maneuvers on rugs and over door sills independently FIM - Locomotion: Ambulation Locomotion: Ambulation: 0: Activity did not occur (paraplegia)  Comprehension Comprehension Mode: Auditory Comprehension: 7-Follows complex conversation/direction: With no assist  Expression Expression Mode: Verbal Expression: 7-Expresses complex ideas: With no assist  Social Interaction Social Interaction: 7-Interacts appropriately with others - No medications needed.  Problem Solving Problem Solving: 7-Solves complex problems: Recognizes & self-corrects  Memory Memory: 7-Complete Independence: No helper   Medical Problem List and Plan:  1. 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 Status post T5-T7 laminectomy for resection and debulking of tumor 11/11/2012   -wean decadron to off over the next 2 weeks. 2. DVT Prophylaxis/Anticoagulation: Subcutaneous Lovenox. Monitor platelet counts any signs of bleeding  3. Pain Management: Percocet as needed and Naprosyn. Lidoderm patch every 12 hours   -pain improving. Continue heat, ROM, appropriate posture and technique 4. Hypertension. Hydrochlorothiazide  12.5 mg daily, lisinopril 40 mg daily. Monitor with increased mobility  5. Neuropsych: This patient is capable of making decisions on his/her own behalf.  6. Tobacco abuse. NicoDerm patch. Provide counseling  7. Neurogenic bladder: voiding with some residual, using Crede   -continue voiding trial, timed voids  -rx UTI (completed)  -ditropan trial stopped due to dry mouth. Tongue and mouth are better today 8. Sexuality: I reviewed sexuality with this patient. I provided her literature and resources from the National Spinal [employer] Association as well. We will discuss further as needed at my office.   LOS (Days) 18 A FACE TO FACE EVALUATION WAS PERFORMED  SWARTZ,ZACHARY T 12/06/2012 7:06 AM

## 2012-12-24 ENCOUNTER — Encounter: Payer: Self-pay | Admitting: Physical Medicine & Rehabilitation

## 2012-12-24 ENCOUNTER — Encounter: Payer: Medicaid Other | Attending: Physical Medicine & Rehabilitation | Admitting: Physical Medicine & Rehabilitation

## 2012-12-24 VITALS — BP 128/70 | HR 100 | Resp 14 | Ht 68.0 in | Wt 219.0 lb

## 2012-12-24 DIAGNOSIS — D497 Neoplasm of unspecified behavior of endocrine glands and other parts of nervous system: Secondary | ICD-10-CM | POA: Insufficient documentation

## 2012-12-24 DIAGNOSIS — N319 Neuromuscular dysfunction of bladder, unspecified: Secondary | ICD-10-CM | POA: Insufficient documentation

## 2012-12-24 DIAGNOSIS — K592 Neurogenic bowel, not elsewhere classified: Secondary | ICD-10-CM | POA: Insufficient documentation

## 2012-12-24 DIAGNOSIS — M899 Disorder of bone, unspecified: Secondary | ICD-10-CM | POA: Insufficient documentation

## 2012-12-24 DIAGNOSIS — IMO0002 Reserved for concepts with insufficient information to code with codable children: Secondary | ICD-10-CM

## 2012-12-24 DIAGNOSIS — G9520 Unspecified cord compression: Secondary | ICD-10-CM

## 2012-12-24 DIAGNOSIS — G8220 Paraplegia, unspecified: Secondary | ICD-10-CM

## 2012-12-24 DIAGNOSIS — G839 Paralytic syndrome, unspecified: Secondary | ICD-10-CM | POA: Insufficient documentation

## 2012-12-24 DIAGNOSIS — IMO0001 Reserved for inherently not codable concepts without codable children: Secondary | ICD-10-CM | POA: Insufficient documentation

## 2012-12-24 DIAGNOSIS — G959 Disease of spinal cord, unspecified: Secondary | ICD-10-CM

## 2012-12-24 DIAGNOSIS — M949 Disorder of cartilage, unspecified: Secondary | ICD-10-CM | POA: Insufficient documentation

## 2012-12-24 MED ORDER — OXYCODONE HCL 5 MG PO TABS: 5.0000 mg | ORAL_TABLET | Freq: Four times a day (QID) | ORAL | Status: DC | PRN

## 2012-12-24 MED ORDER — METHOCARBAMOL 500 MG PO TABS: 500.0000 mg | ORAL_TABLET | Freq: Four times a day (QID) | ORAL | Status: DC | PRN

## 2012-12-24 NOTE — Progress Notes (Signed)
Subjective:    Patient ID: Debbie [name], female    DOB: August 28, 1976, 37 y.o.   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 She continues to have pain under right scapula. She noticed an increase in her pain after she came off the steroids. She has been taking up to 18 per day on bad days.   She was weaned off the steroids as of Monday last week.   From a bowel and bladder standpoint she is experiencing better continence. She has been continent of bladder over the last 3 days. She had a lot of urinary urge incontinence before. She is moving his bowels every other day.   From a mobility standpoint she is still at home. She is finishing up with home therapies. She starts outpt therapy next week. She has noticed some increased movements in her feet, especially the right foot. She is still dependent for basic transfers, mobility, self-care    Pain Inventory Average Pain 3 Pain Right Now 6 My pain is burning, stabbing and aching  In the last 24 hours, has pain interfered with the following? General activity 6 Relation with others 9 Enjoyment of life 9 What TIME of day is your pain at its worst? all Sleep (in general) Poor  Pain is worse with: sitting and some activites Pain improves with: heat/ice and medication Relief from Meds: 3  Mobility use a wheelchair  Function disabled: date disabled  I need assistance with the following:  household duties  Neuro/Psych bladder control problems bowel control problems tingling spasms  Prior Studies Any changes since last visit?  no  Physicians involved in your care Any changes since last visit?  no   Family History  Problem Relation Age of Onset  . Multiple sclerosis Father   . Diabetes Mother    History   Social History  . Marital Status: Legally Separated    Spouse Name: N/A    Number of Children: N/A  . Years of Education: N/A   Social History Main Topics  . Smoking status: Current Every  Day Smoker -- 1.5 packs/day for 10 years    Types: Cigarettes  . Smokeless tobacco: None  . Alcohol Use: Yes     Comment: occ  . Drug Use: No  . Sexually Active: Yes    Birth Control/ Protection: None   Other Topics Concern  . None   Social History Narrative  . None   Past Surgical History  Procedure Date  . Fracture surgery   . Cesarean section   . Laminectomy 11/11/2012    Procedure: THORACIC LAMINECTOMY FOR TUMOR;  Surgeon: Temple Pacini, MD;  Location: [facility];  Service: Neurosurgery;  Laterality: N/A;  thracic laminectomy for intradural and intramedullary neoplasm  . Spine surgery    Past Medical History  Diagnosis Date  . Hypertension    BP 128/70  Pulse 100  Resp 14  Ht 5\' 8"  (1.727 m)  Wt 219 lb (99.338 kg)  BMI 33.30 kg/m2  SpO2 99%    Review of Systems  Constitutional: Positive for fever, chills, diaphoresis and unexpected weight change.  Respiratory: Positive for apnea and cough.   Cardiovascular: Positive for leg swelling.  Gastrointestinal: Positive for nausea.       Bowel Control Problems   Genitourinary:       Bladder control problems   Musculoskeletal:       Spasms / pain in ribs and right scapula area  Skin:  Positive for rash.       On face  Neurological:       Tingling   All other systems reviewed and are negative.       Objective:   Physical Exam  General: Alert and oriented x 3, No apparent distress. Obese, round facies HEENT: Head is normocephalic, atraumatic, PERRLA, EOMI, sclera anicteric, oral mucosa pink and moist, dentition intact, ext ear canals clear,  Neck: Supple without JVD or lymphadenopathy Heart: Reg rate and rhythm. No murmurs rubs or gallops Chest: CTA bilaterally without wheezes, rales, or rhonchi; no distress Abdomen: Soft, non-tender, non-distended, bowel sounds positive. Extremities: No clubbing, cyanosis, or edema. Pulses are 2+ Skin: Clean and intact without signs of breakdown Neuro: Pt is cognitively  appropriate with normal insight, memory, and awareness. Cranial nerves 2-12 are intact. Sensory exam is normal. Reflexes are 2+ in all 4's. Fine motor coordination is intact. No tremors. Motor function is trace at HF, hip rotators right more than left. 0/5 with KE and KF. She had race toe movement on the right and absent on left. She has gross pain sensation on the right leg, minimal sensation on the left Musculoskeletal: Full ROM, No pain with AROM or PROM in the neck, trunk. She has pain with palpation over the right T3-4 parapsinals/rhomboids. Probably underlying scar tissue also. The scapula doesn't appear misplaced. Posture appropriate Psych: Pt's affect is appropriate. Pt is cooperative        Assessment & Plan:  1. Paraplegia secondary to intradural mass at T5-T6.  2. Neurogenic bowel and bladder. 3. Myofascial pain 4. Spastic paraplegia   Plan: 1.After informed consent and preparation of the skin with isopropyl alcohol, I injected the right rhomboids/thoracic parapsinals with 2cc of 1% lidocaine. The patient tolerated well, and no complications were experienced. Post-injection instructions were provided. Needs to work on rom, massage, posture, technique as well. I would like therapy to address also 2. Discussed timed voids at home. Her patterns have shown improvement off of the steroid. 3. Leg elevation to control edema. 4. Oxycodone for breakthrough pain to avoid excess tylenol. Consider long acting agent if pain persistent 5. Continue with lidoderm patches if attainable. Robaxin effective for spasm.  6. I will see her back next month. 30 minutes of face to face patient care time were spent during this visit. All questions were encouraged and answered.

## 2012-12-24 NOTE — Patient Instructions (Signed)
CONTINUE STRETCHING AND GOOD POSTURE, CAREFUL TECHNIQUE AS IT PERTAINS TO YOUR BACK.

## 2012-12-27 ENCOUNTER — Telehealth: Payer: Self-pay | Admitting: *Deleted

## 2012-12-27 DIAGNOSIS — G8220 Paraplegia, unspecified: Secondary | ICD-10-CM

## 2012-12-27 DIAGNOSIS — G9520 Unspecified cord compression: Secondary | ICD-10-CM

## 2012-12-27 NOTE — Telephone Encounter (Signed)
Received call from Chales Salmon, PT with [facility]. She is requesting an order for Fully electric Hospital Bed. Please fax to [facility] at [phone]. Please include her demographics.

## 2012-12-30 ENCOUNTER — Telehealth: Payer: Self-pay

## 2012-12-30 DIAGNOSIS — G9520 Unspecified cord compression: Secondary | ICD-10-CM

## 2012-12-30 DIAGNOSIS — G8220 Paraplegia, unspecified: Secondary | ICD-10-CM

## 2012-12-30 NOTE — Telephone Encounter (Signed)
Debbie [name] with [facility] called requesting an order for physical therapy be sent to [facility] for physical therapy.

## 2012-12-31 NOTE — Telephone Encounter (Signed)
Order faxed to [facility].  

## 2012-12-31 NOTE — Telephone Encounter (Signed)
Order for outpatient PT was placed.

## 2013-01-06 ENCOUNTER — Telehealth: Payer: Self-pay

## 2013-01-06 NOTE — Telephone Encounter (Signed)
Misty Stanley called again saying patient still waiting on order for hospital bed.  Per notes this has been faxed and per [facility] not received this.  Will refax.  Misty Stanley was also following up on referral for physical therapy.  This was ordered 12/31/12.  Advised her referrals can take 5-7 business days and the facility would contact her to make an appointment.

## 2013-01-06 NOTE — Telephone Encounter (Signed)
Patient was calling to follow up on referral for therapy and order for hospital bed.  She talked to her home health nurse who told her the order for the bed had been faxed to [facility] and that the referral could take a few days.  She just had some other questions. Please call.

## 2013-01-07 NOTE — Telephone Encounter (Signed)
Returned patients call.  Informed her we have tried faxing order for bed but it keeps getting denied.  She is going to call back with the fax number.

## 2013-01-07 NOTE — Telephone Encounter (Signed)
[facility] fax # [phone]

## 2013-01-21 ENCOUNTER — Ambulatory Visit: Payer: Medicaid Other | Admitting: Rehabilitative and Restorative Service Providers"

## 2013-01-22 ENCOUNTER — Encounter: Payer: Medicaid Other | Attending: Physical Medicine & Rehabilitation | Admitting: Physical Medicine & Rehabilitation

## 2013-01-22 ENCOUNTER — Ambulatory Visit
Payer: Medicaid Other | Attending: Physical Medicine & Rehabilitation | Admitting: Rehabilitative and Restorative Service Providers"

## 2013-01-22 DIAGNOSIS — M2420 Disorder of ligament, unspecified site: Secondary | ICD-10-CM | POA: Insufficient documentation

## 2013-01-22 DIAGNOSIS — G959 Disease of spinal cord, unspecified: Secondary | ICD-10-CM | POA: Insufficient documentation

## 2013-01-22 DIAGNOSIS — IMO0001 Reserved for inherently not codable concepts without codable children: Secondary | ICD-10-CM | POA: Insufficient documentation

## 2013-01-22 DIAGNOSIS — R5381 Other malaise: Secondary | ICD-10-CM | POA: Insufficient documentation

## 2013-01-22 DIAGNOSIS — G8220 Paraplegia, unspecified: Secondary | ICD-10-CM | POA: Insufficient documentation

## 2013-01-22 DIAGNOSIS — M6281 Muscle weakness (generalized): Secondary | ICD-10-CM | POA: Insufficient documentation

## 2013-01-22 DIAGNOSIS — K219 Gastro-esophageal reflux disease without esophagitis: Secondary | ICD-10-CM | POA: Insufficient documentation

## 2013-01-22 DIAGNOSIS — M629 Disorder of muscle, unspecified: Secondary | ICD-10-CM | POA: Insufficient documentation

## 2013-01-24 ENCOUNTER — Ambulatory Visit: Payer: Medicaid Other | Admitting: Physical Therapy

## 2013-01-29 ENCOUNTER — Encounter (HOSPITAL_BASED_OUTPATIENT_CLINIC_OR_DEPARTMENT_OTHER): Payer: Medicaid Other | Admitting: Physical Medicine & Rehabilitation

## 2013-01-29 ENCOUNTER — Encounter: Payer: Self-pay | Admitting: Physical Medicine & Rehabilitation

## 2013-01-29 VITALS — BP 142/76 | HR 93 | Resp 14 | Ht 68.0 in | Wt 219.0 lb

## 2013-01-29 DIAGNOSIS — G959 Disease of spinal cord, unspecified: Secondary | ICD-10-CM

## 2013-01-29 DIAGNOSIS — IMO0001 Reserved for inherently not codable concepts without codable children: Secondary | ICD-10-CM

## 2013-01-29 DIAGNOSIS — G8220 Paraplegia, unspecified: Secondary | ICD-10-CM

## 2013-01-29 DIAGNOSIS — K219 Gastro-esophageal reflux disease without esophagitis: Secondary | ICD-10-CM | POA: Insufficient documentation

## 2013-01-29 DIAGNOSIS — G9520 Unspecified cord compression: Secondary | ICD-10-CM

## 2013-01-29 DIAGNOSIS — IMO0002 Reserved for concepts with insufficient information to code with codable children: Secondary | ICD-10-CM

## 2013-01-29 DIAGNOSIS — N319 Neuromuscular dysfunction of bladder, unspecified: Secondary | ICD-10-CM | POA: Insufficient documentation

## 2013-01-29 DIAGNOSIS — K592 Neurogenic bowel, not elsewhere classified: Secondary | ICD-10-CM | POA: Insufficient documentation

## 2013-01-29 MED ORDER — OXYCODONE HCL 5 MG PO TABS: 5.0000 mg | ORAL_TABLET | Freq: Four times a day (QID) | ORAL | Status: DC | PRN

## 2013-01-29 MED ORDER — GABAPENTIN 300 MG PO CAPS: 300.0000 mg | ORAL_CAPSULE | Freq: Three times a day (TID) | ORAL | Status: DC

## 2013-01-29 MED ORDER — PANTOPRAZOLE SODIUM 40 MG PO TBEC: 40.0000 mg | DELAYED_RELEASE_TABLET | Freq: Every day | ORAL | Status: DC

## 2013-01-29 MED ORDER — TIZANIDINE HCL 2 MG PO TABS: 2.0000 mg | ORAL_TABLET | Freq: Three times a day (TID) | ORAL | Status: DC

## 2013-01-29 NOTE — Patient Instructions (Signed)
ZANAFLEX: (2MG )---SPASM ONE AT NIGHT FOR 2 DAYS THEN ONE TWICE DAILY FOR 2 DAYS, THEN ONE THREE X PER DAY   IN 2 WEEKS, BEGIN GABAPENTIN (300MG )--NERVE PAIN TAKE ONE AT NIGHT FOR 3 DAYS, THEN ONE TWICE DAILY FOR 3 DAYS, THEN ONE THREE X PER DAY

## 2013-01-29 NOTE — Progress Notes (Signed)
Subjective:    Patient ID: Debbie [name], female    DOB: October 01, 1976, 37 y.o.   MRN: [id number]  HPI  Debbie [name] is back regarding her spinal mass and associated spastic paraplegia. Pt reports increased spasms in her trunk and legs which are triggered by movements or tactile stimuli. They are also happening at rest. Therapy does report increased strength and sensation in the trunk.  From a bowel and bladder standpoint, she has little warning of when she is going to bathroom, bladder more than bowel. She has started some senna for constipation. She is doing pelvic exercises to help strengthen up that musculature.  Her upper back has been better since we performed trigger point injections next month.   She reports generalized burning pain in both legs but has not had signifcant return of light touch.   Debbie [name] uses the oxycodone for breakthrough pain, usually 0-2x per day.   Pain Inventory Average Pain 6 Pain Right Now 2 My pain is burning and aching  In the last 24 hours, has pain interfered with the following? General activity 5 Relation with others 4 Enjoyment of life 5 What TIME of day is your pain at its worst? morning, evening and night Sleep (in general) Poor  Pain is worse with: sitting Pain improves with: heat/ice and medication Relief from Meds: 8  Mobility use a wheelchair needs help with transfers Do you have any goals in this area?  yes  Function not employed: date last employed unknown I need assistance with the following:  dressing, bathing and toileting Do you have any goals in this area?  yes  Neuro/Psych bladder control problems weakness numbness tingling spasms  Prior Studies Any changes since last visit?  no  Physicians involved in your care Any changes since last visit?  no   Family History  Problem Relation Age of Onset  . Multiple sclerosis Father   . Diabetes Mother    History   Social History  . Marital Status: Legally Separated     Spouse Name: N/A    Number of Children: N/A  . Years of Education: N/A   Social History Main Topics  . Smoking status: Former Smoker -- 1.50 packs/day for 10 years    Types: Cigarettes    Quit date: 11/07/2012  . Smokeless tobacco: None  . Alcohol Use: Yes     Comment: occ  . Drug Use: No  . Sexually Active: Yes    Birth Control/ Protection: None   Other Topics Concern  . None   Social History Narrative  . None   Past Surgical History  Procedure Laterality Date  . Fracture surgery    . Cesarean section    . 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 Spine surgery     Past Medical History  Diagnosis Date  . Hypertension    BP 142/76  Pulse 93  Resp 14  Ht 5\' 8"  (1.727 m)  Wt 219 lb (99.338 kg)  BMI 33.31 kg/m2  SpO2 100%  LMP 01/19/2013     Review of Systems  Cardiovascular: Positive for leg swelling.  Neurological: Positive for weakness and numbness.  All other systems reviewed and are negative.       Objective:   Physical Exam General: Alert and oriented x 3, No apparent distress. Obese, round  facies  HEENT: Head is normocephalic, atraumatic, PERRLA, EOMI, sclera anicteric, oral mucosa pink and moist, dentition intact, ext ear canals clear,  Neck: Supple without JVD or lymphadenopathy  Heart: Reg rate and rhythm. No murmurs rubs or gallops  Chest: CTA bilaterally without wheezes, rales, or rhonchi; no distress  Abdomen: Soft, non-tender, non-distended, bowel sounds positive.  Extremities: No clubbing, cyanosis, or edema. Pulses are 2+  Skin: Clean and intact without signs of breakdown  Neuro: Pt is cognitively appropriate with normal insight, memory, and awareness. Cranial nerves 2-12 are intact. Sensory exam is normal. Reflexes are 3+ in lower extremities. Fine motor  coordination is intact in UE's. No tremors. Motor function is trace at HF, hip rotators right more than left. 1+ to 2-/5 with right KE, 1 with left KE and KF. She had trace to 1 toe/ankle movement on the right and absent on left. She has gross pain sensation on the right leg, minimal sensation on the left. She displayed 2/5 tone at HF and KF, ankles. Sustained clonus was seen at the right greater than left ankles, typically triggered by passive movement of the legs, sensory testing.  Musculoskeletal: Full ROM, No pain with AROM or PROM in the neck, trunk. Good sitting posture. Minimal pain in the intrascapular spinal region. No TP's seen. . The scapula doesn't appear misplaced. Posture appropriate  Psych: Pt's affect is appropriate. Pt is cooperative. She seems to be in good spirits.  Assessment & Plan:   1. 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 2. Neurogenic bowel and bladder.  3. Myofascial pain  4. 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   Plan:  1.Begin trial of zanaflex starting at 2mg  qhs titrating up to TID. likley will need a much higher dose or second agent such as klonopin to help control clonus.  -the zanaflex may help her urinary urgency also 2. In two weeks she will start neurontin 300mg  qhs titrating up to tid as well. This may also help with her spasticity  3. Continue Leg elevation, stretching to control edema and to help with spasms.  4. Oxycodone for breakthrough pain to avoid excess tylenol. Her pain generally seems to be improving and she is using less and less of this. No UDS today 5. Discussed wheelchair modifications today.   6. I will see her back next month. 30 minutes of face to face patient care time were spent during this visit. All questions were encouraged and answered.

## 2013-01-31 ENCOUNTER — Ambulatory Visit: Payer: Medicaid Other | Admitting: Physical Therapy

## 2013-02-04 ENCOUNTER — Ambulatory Visit: Payer: Medicaid Other | Admitting: Rehabilitative and Restorative Service Providers"

## 2013-02-07 ENCOUNTER — Ambulatory Visit: Payer: Medicaid Other | Admitting: Rehabilitative and Restorative Service Providers"

## 2013-02-11 ENCOUNTER — Ambulatory Visit
Payer: Medicaid Other | Attending: Physical Medicine & Rehabilitation | Admitting: Rehabilitative and Restorative Service Providers"

## 2013-02-11 DIAGNOSIS — IMO0001 Reserved for inherently not codable concepts without codable children: Secondary | ICD-10-CM | POA: Insufficient documentation

## 2013-02-11 DIAGNOSIS — R5381 Other malaise: Secondary | ICD-10-CM | POA: Insufficient documentation

## 2013-02-11 DIAGNOSIS — M6281 Muscle weakness (generalized): Secondary | ICD-10-CM | POA: Insufficient documentation

## 2013-02-11 DIAGNOSIS — M629 Disorder of muscle, unspecified: Secondary | ICD-10-CM | POA: Insufficient documentation

## 2013-02-11 DIAGNOSIS — M2420 Disorder of ligament, unspecified site: Secondary | ICD-10-CM | POA: Insufficient documentation

## 2013-02-14 ENCOUNTER — Ambulatory Visit: Payer: Medicaid Other | Admitting: Physical Therapy

## 2013-02-18 ENCOUNTER — Ambulatory Visit: Payer: Medicaid Other | Admitting: Rehabilitative and Restorative Service Providers"

## 2013-02-21 ENCOUNTER — Ambulatory Visit: Payer: Medicaid Other | Admitting: Physical Therapy

## 2013-02-24 ENCOUNTER — Encounter: Payer: Medicaid Other | Admitting: Physical Medicine & Rehabilitation

## 2013-02-25 ENCOUNTER — Ambulatory Visit: Payer: Medicaid Other | Admitting: Physical Therapy

## 2013-02-28 ENCOUNTER — Ambulatory Visit: Payer: Medicaid Other | Admitting: Physical Therapy

## 2013-03-04 ENCOUNTER — Ambulatory Visit: Payer: Medicaid Other | Admitting: Rehabilitative and Restorative Service Providers"

## 2013-03-04 ENCOUNTER — Telehealth: Payer: Self-pay | Admitting: *Deleted

## 2013-03-04 NOTE — Telephone Encounter (Signed)
Debbie [name] had to cancel last appointment because of bad spasms.  Debbie [name] is wondering and said he PT person was going to request that her muscle relaxer be changed. ( Debbie [name] has been given robaxin and tizanidine)

## 2013-03-05 NOTE — Telephone Encounter (Signed)
Patient informed to increase zanaflex to 4 mg every q8hrs for 3 days then increase to qid thereafter.

## 2013-03-05 NOTE — Telephone Encounter (Signed)
Increase zanaflex to 4mg  q8 hrs for 3 days then increase to qid thereafter

## 2013-03-06 ENCOUNTER — Ambulatory Visit: Payer: Medicaid Other | Admitting: Rehabilitative and Restorative Service Providers"

## 2013-03-10 ENCOUNTER — Ambulatory Visit: Payer: Medicaid Other | Attending: Physical Medicine & Rehabilitation | Admitting: Physical Therapy

## 2013-03-10 DIAGNOSIS — M2420 Disorder of ligament, unspecified site: Secondary | ICD-10-CM | POA: Insufficient documentation

## 2013-03-10 DIAGNOSIS — M6281 Muscle weakness (generalized): Secondary | ICD-10-CM | POA: Insufficient documentation

## 2013-03-10 DIAGNOSIS — R5381 Other malaise: Secondary | ICD-10-CM | POA: Insufficient documentation

## 2013-03-10 DIAGNOSIS — IMO0001 Reserved for inherently not codable concepts without codable children: Secondary | ICD-10-CM | POA: Insufficient documentation

## 2013-03-10 DIAGNOSIS — M629 Disorder of muscle, unspecified: Secondary | ICD-10-CM | POA: Insufficient documentation

## 2013-03-12 ENCOUNTER — Ambulatory Visit: Payer: Medicaid Other | Admitting: Physical Therapy

## 2013-03-17 ENCOUNTER — Telehealth: Payer: Self-pay

## 2013-03-17 ENCOUNTER — Ambulatory Visit: Payer: Medicaid Other | Admitting: Physical Therapy

## 2013-03-17 DIAGNOSIS — IMO0001 Reserved for inherently not codable concepts without codable children: Secondary | ICD-10-CM

## 2013-03-17 DIAGNOSIS — G9520 Unspecified cord compression: Secondary | ICD-10-CM

## 2013-03-17 DIAGNOSIS — IMO0002 Reserved for concepts with insufficient information to code with codable children: Secondary | ICD-10-CM

## 2013-03-17 DIAGNOSIS — G8220 Paraplegia, unspecified: Secondary | ICD-10-CM

## 2013-03-17 NOTE — Telephone Encounter (Signed)
Patient called wanting something stronger for muscle spasms and she also needs her pain medication filled.

## 2013-03-19 ENCOUNTER — Ambulatory Visit: Payer: Medicaid Other | Admitting: Rehabilitative and Restorative Service Providers"

## 2013-03-19 MED ORDER — OXYCODONE HCL 5 MG PO TABS: 5.0000 mg | ORAL_TABLET | Freq: Four times a day (QID) | ORAL | Status: DC | PRN

## 2013-03-19 NOTE — Telephone Encounter (Signed)
Printed rx  

## 2013-03-19 NOTE — Telephone Encounter (Signed)
Debbie [name] has appt 03/28/13.  She is wanting her pain med refilled (oxycodone last filled 01/29/13, but also wants something stronger for muscle spasms.)  I can have Clydie Braun sign the rx but what do you want to do about the spasms? She has not seem Toniann Fail.

## 2013-03-19 NOTE — Telephone Encounter (Signed)
Patient called again

## 2013-03-20 ENCOUNTER — Telehealth: Payer: Self-pay | Admitting: *Deleted

## 2013-03-20 NOTE — Telephone Encounter (Signed)
Notified [facility] is available for pick up.

## 2013-03-24 ENCOUNTER — Encounter: Payer: Self-pay | Admitting: Physical Therapy

## 2013-03-24 ENCOUNTER — Ambulatory Visit: Payer: Medicaid Other | Admitting: Physical Therapy

## 2013-03-24 NOTE — Telephone Encounter (Signed)
is pt taking zanaflex 4mg  qid as recommended? i'm hesitant to make further changes without seeing her first hand given that her appt is Friday.

## 2013-03-27 ENCOUNTER — Ambulatory Visit: Payer: Medicaid Other | Admitting: Rehabilitative and Restorative Service Providers"

## 2013-03-28 ENCOUNTER — Encounter: Payer: Medicaid Other | Admitting: Physical Medicine & Rehabilitation

## 2013-04-01 ENCOUNTER — Ambulatory Visit: Payer: Medicaid Other | Admitting: Physical Therapy

## 2013-04-03 ENCOUNTER — Ambulatory Visit: Payer: Medicaid Other | Admitting: Rehabilitative and Restorative Service Providers"

## 2013-04-04 ENCOUNTER — Encounter: Payer: Medicaid Other | Attending: Physical Medicine & Rehabilitation | Admitting: Physical Medicine & Rehabilitation

## 2013-04-04 ENCOUNTER — Encounter: Payer: Self-pay | Admitting: Physical Medicine & Rehabilitation

## 2013-04-04 VITALS — BP 161/106 | HR 91 | Resp 16 | Ht 68.0 in | Wt 220.0 lb

## 2013-04-04 DIAGNOSIS — G8220 Paraplegia, unspecified: Secondary | ICD-10-CM

## 2013-04-04 DIAGNOSIS — IMO0001 Reserved for inherently not codable concepts without codable children: Secondary | ICD-10-CM

## 2013-04-04 DIAGNOSIS — IMO0002 Reserved for concepts with insufficient information to code with codable children: Secondary | ICD-10-CM

## 2013-04-04 DIAGNOSIS — Z5181 Encounter for therapeutic drug level monitoring: Secondary | ICD-10-CM

## 2013-04-04 DIAGNOSIS — K592 Neurogenic bowel, not elsewhere classified: Secondary | ICD-10-CM | POA: Insufficient documentation

## 2013-04-04 DIAGNOSIS — N319 Neuromuscular dysfunction of bladder, unspecified: Secondary | ICD-10-CM | POA: Insufficient documentation

## 2013-04-04 DIAGNOSIS — Z79899 Other long term (current) drug therapy: Secondary | ICD-10-CM | POA: Insufficient documentation

## 2013-04-04 MED ORDER — TIZANIDINE HCL 4 MG PO TABS: 4.0000 mg | ORAL_TABLET | Freq: Four times a day (QID) | ORAL | Status: DC | PRN

## 2013-04-04 MED ORDER — GABAPENTIN 600 MG PO TABS: 600.0000 mg | ORAL_TABLET | Freq: Three times a day (TID) | ORAL | Status: DC

## 2013-04-04 MED ORDER — CLONAZEPAM 1 MG PO TABS: 1.0000 mg | ORAL_TABLET | Freq: Two times a day (BID) | ORAL | Status: DC | PRN

## 2013-04-04 MED ORDER — TIZANIDINE HCL 4 MG PO TABS: 4.0000 mg | ORAL_TABLET | Freq: Four times a day (QID) | ORAL | Status: DC

## 2013-04-04 MED ORDER — CLONAZEPAM 1 MG PO TABS: 1.0000 mg | ORAL_TABLET | Freq: Two times a day (BID) | ORAL | Status: DC

## 2013-04-04 NOTE — Patient Instructions (Signed)
KLONOPIN: FOR 5 DAYS TAKE 1/2 TAB TWICE DAILY, THEN INCREASE TO FULL TAB THEREAFTER   GABAPENTIN: IN TWO WEEKS INCREASE TO: 300MG  AM, 300MG  PM, AND 600MG  AT BED FOR 5 DAYS THEN 600-300-600 FOR 5 DAYS THEN 600MG  THREE X PER DAY THEREAFTER

## 2013-04-04 NOTE — Progress Notes (Signed)
Subjective:    Patient ID: Debbie [name], female    DOB: 02-05-1976, 37 y.o.   MRN: [id number]  HPI  Debbie [name] is back regarding her thoracic mass and spastic paraplegia. Her main problem has been her spasms and spasticity. We increased her tizanidine a few weeks ago but she didn't see much of a change. She is seeing voluntary motor return but the spasticity is interfering with this. She has been able to stand better and transfer much more efficiently as a whole. When the spasms flare, however, it becomes difficult for her to move. She is alos noting increased urinary incontinence and frequency again.   As a whole, her pain levels are down. She is using much less oxycodone.   From a social standpoint, she is moving into a new home which will be handicapped accessible. She is very excited about that. Debbie [name] received her new wheelchair since we last met.  Pain Inventory Average Pain 4 Pain Right Now 4 My pain is stabbing and aching  In the last 24 hours, has pain interfered with the following? General activity 6 Relation with others 4 Enjoyment of life 6 What TIME of day is your pain at its worst? morning,night time Sleep (in general) n/a  Pain is worse with: standing Pain improves with: rest and medication Relief from Meds: 8  Mobility use a wheelchair needs help with transfers  Function Do you have any goals in this area?  no  Neuro/Psych bladder control problems weakness spasms  Prior Studies Any changes since last visit?  no  Physicians involved in your care Any changes since last visit?  no   Family History  Problem Relation Age of Onset  . Multiple sclerosis Father   . Diabetes Mother    History   Social History  . Marital Status: Legally Separated    Spouse Name: N/A    Number of Children: N/A  . Years of Education: N/A   Social History Main Topics  . Smoking status: Former Smoker -- 1.50 packs/day for 10 years    Types: Cigarettes    Quit date:  11/07/2012  . Smokeless tobacco: None  . Alcohol Use: Yes     Comment: occ  . Drug Use: No  . Sexually Active: Yes    Birth Control/ Protection: None   Other Topics Concern  . None   Social History Narrative  . None   Past Surgical History  Procedure Laterality Date  . Fracture surgery    . Cesarean section    . 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 Spine surgery     Past Medical History  Diagnosis Date  . Hypertension    BP 161/106  Pulse 91  Resp 16  Ht 5\' 8"  (1.727 m)  Wt 220 lb (99.791 kg)  BMI 33.46 kg/m2  SpO2 98%  LMP 03/24/2013     Review of Systems  Cardiovascular: Positive for leg swelling.  Genitourinary: Positive for dysuria.  Neurological: Positive for weakness.       Spasms.   All other systems reviewed and are negative.       Objective:   Physical Exam  General: Alert and oriented x 3, No apparent distress. Obese, round facies  HEENT: Head is normocephalic, atraumatic, PERRLA, EOMI, sclera anicteric, oral mucosa pink and moist, dentition  intact, ext ear canals clear,  Neck: Supple without JVD or lymphadenopathy  Heart: Reg rate and rhythm. No murmurs rubs or gallops  Chest: CTA bilaterally without wheezes, rales, or rhonchi; no distress  Abdomen: Soft, non-tender, non-distended, bowel sounds positive.  Extremities: No clubbing, cyanosis, or edema. Pulses are 2+  Skin: Clean and intact without signs of breakdown  Neuro: Pt is cognitively appropriate with normal insight, memory, and awareness. Cranial nerves 2-12 are intact. Sensory exam is normal. Reflexes are 3+ in lower extremities. Fine motor coordination is intact in UE's. No tremors. Motor function is trace at HF, hip rotators right more than left. 1+ to 2-/5 with right KE, 1 with left KE and KF.  She had trace to 1 toe/ankle movement on the right and absent on left. She has gross pain sensation on the right leg, minimal sensation on the left. She displayed 2/5 tone at HF and KF, ankles. Sustained clonus was seen at the right greater than left ankles, typically triggered by passive movement of legs, but often started on its own while she was at rest.  Musculoskeletal: Full ROM, No pain with AROM or PROM in the neck, trunk. Good sitting posture. Minimal pain in the intrascapular spinal region. No TP's seen. . The scapula doesn't appear misplaced. Posture appropriate  Psych: Pt's affect is appropriate. Pt is cooperative. She seems to be in good spirits.  Assessment & Plan:   1. 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  2. Neurogenic bowel and bladder.  3. Myofascial pain  4. 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   Plan:  1. Continue tizanidine 4mg  qid. Don't know that we will see much change if I increase it further.  2. In two weeks she will increase neurontin to 600mg  TID over one week's time.  3. Continue Leg elevation, stretching to control edema and to help with spasms. Continue with outpt therapy program 4. Oxycodone for breakthrough pain to avoid excess tylenol. Her pain continues to improve and she is using less and less of this.    5. She has her new wheelchair  6. Will add klonopin for clonus and spasticity. Start at 0.5mg  bid and increase to 1mg  bid after 5 days. May be able to wean tizanidine if this helps. Given the the increase in her spasms and clonus along with urinary symptoms, I do suspect that she may have a UTI which could be exacerbating sx. A urine culture and analysis was ordered and collected today.  6. I will see her back next month. 30 minutes of face to face patient care time were spent during this visit. All questions were encouraged and answered.

## 2013-04-05 LAB — URINE CULTURE: Colony Count: NO GROWTH

## 2013-04-09 ENCOUNTER — Ambulatory Visit: Payer: Medicaid Other | Attending: Physical Medicine & Rehabilitation | Admitting: Physical Therapy

## 2013-04-09 DIAGNOSIS — M629 Disorder of muscle, unspecified: Secondary | ICD-10-CM | POA: Insufficient documentation

## 2013-04-09 DIAGNOSIS — R5381 Other malaise: Secondary | ICD-10-CM | POA: Insufficient documentation

## 2013-04-09 DIAGNOSIS — M6281 Muscle weakness (generalized): Secondary | ICD-10-CM | POA: Insufficient documentation

## 2013-04-09 DIAGNOSIS — IMO0001 Reserved for inherently not codable concepts without codable children: Secondary | ICD-10-CM | POA: Insufficient documentation

## 2013-04-09 DIAGNOSIS — M2420 Disorder of ligament, unspecified site: Secondary | ICD-10-CM | POA: Insufficient documentation

## 2013-04-11 ENCOUNTER — Ambulatory Visit: Payer: Medicaid Other | Admitting: Physical Therapy

## 2013-04-14 ENCOUNTER — Ambulatory Visit: Payer: Medicaid Other | Admitting: Physical Therapy

## 2013-04-16 ENCOUNTER — Ambulatory Visit: Payer: Medicaid Other | Admitting: Rehabilitative and Restorative Service Providers"

## 2013-04-21 ENCOUNTER — Ambulatory Visit: Payer: Medicaid Other | Admitting: Physical Therapy

## 2013-04-23 ENCOUNTER — Ambulatory Visit: Payer: Medicaid Other | Admitting: Physical Therapy

## 2013-04-30 ENCOUNTER — Ambulatory Visit: Payer: Medicaid Other | Admitting: Physical Therapy

## 2013-05-02 ENCOUNTER — Ambulatory Visit: Payer: Medicaid Other | Admitting: Rehabilitative and Restorative Service Providers"

## 2013-05-05 ENCOUNTER — Encounter: Payer: Self-pay | Admitting: Physical Medicine & Rehabilitation

## 2013-05-05 ENCOUNTER — Encounter: Payer: Medicaid Other | Attending: Physical Medicine & Rehabilitation | Admitting: Physical Medicine & Rehabilitation

## 2013-05-05 VITALS — BP 158/83 | HR 81 | Resp 14 | Ht 68.0 in | Wt 220.0 lb

## 2013-05-05 DIAGNOSIS — N319 Neuromuscular dysfunction of bladder, unspecified: Secondary | ICD-10-CM | POA: Insufficient documentation

## 2013-05-05 DIAGNOSIS — G959 Disease of spinal cord, unspecified: Secondary | ICD-10-CM | POA: Insufficient documentation

## 2013-05-05 DIAGNOSIS — G8220 Paraplegia, unspecified: Secondary | ICD-10-CM | POA: Insufficient documentation

## 2013-05-05 DIAGNOSIS — IMO0001 Reserved for inherently not codable concepts without codable children: Secondary | ICD-10-CM | POA: Insufficient documentation

## 2013-05-05 DIAGNOSIS — G9520 Unspecified cord compression: Secondary | ICD-10-CM

## 2013-05-05 DIAGNOSIS — M6580 Other synovitis and tenosynovitis, unspecified site: Secondary | ICD-10-CM | POA: Insufficient documentation

## 2013-05-05 DIAGNOSIS — IMO0002 Reserved for concepts with insufficient information to code with codable children: Secondary | ICD-10-CM

## 2013-05-05 MED ORDER — OXYCODONE HCL 5 MG PO TABS: 5.0000 mg | ORAL_TABLET | Freq: Four times a day (QID) | ORAL | Status: DC | PRN

## 2013-05-05 MED ORDER — CLONAZEPAM 1 MG PO TABS: 1.0000 mg | ORAL_TABLET | Freq: Three times a day (TID) | ORAL | Status: DC

## 2013-05-05 NOTE — Progress Notes (Signed)
Subjective:    Patient ID: Debbie [name], female    DOB: 06-27-1976, 37 y.o.   MRN: [id number]  HPI  Madeleyn is back regarding her thoracic paraplegia and spasticity. We started klonopin last month. She's not sure that it has helped much. Therapy, however, is seeing progress and is looking at leg bracing bilaterally. The klonopin makes her a little sleepy, but she has gotten used to it over the last couple weeks.   She is transferring easier. Sometimes, she doesn't use the transfer board.  From a bladder standpoint, she is rarely having incontinence. She has had a couple incontinent episodes of stool when she had diarrhea last month.    For pain, she is taking oxycodone prior to therapy. She typically uses one before therapy, one after and one at bedtime. She'll use them once or twice daily when she's not in rehab.    Pain Inventory Average Pain 5 Pain Right Now 5 My pain is dull, tingling and aching  In the last 24 hours, has pain interfered with the following? General activity 5 Relation with others 5 Enjoyment of life 10 What TIME of day is your pain at its worst? constant Sleep (in general) Poor  Pain is worse with: sitting and inactivity Pain improves with: therapy/exercise and medication Relief from Meds: 5  Mobility ability to climb steps?  no do you drive?  no use a wheelchair needs help with transfers  Function Do you have any goals in this area?  yes  Neuro/Psych weakness tremor spasms  Prior Studies Any changes since last visit?  no  Physicians involved in your care Any changes since last visit?  no   Family History  Problem Relation Age of Onset  . Multiple sclerosis Father   . Diabetes Mother    History   Social History  . Marital Status: Legally Separated    Spouse Name: N/A    Number of Children: N/A  . Years of Education: N/A   Social History Main Topics  . Smoking status: Former Smoker -- 1.50 packs/day for 10 years    Types:  Cigarettes    Quit date: 11/07/2012  . Smokeless tobacco: None  . Alcohol Use: Yes     Comment: occ  . Drug Use: No  . Sexually Active: Yes    Birth Control/ Protection: None   Other Topics Concern  . None   Social History Narrative  . None   Past Surgical History  Procedure Laterality Date  . Fracture surgery    . Cesarean section    . 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 Spine surgery     Past Medical History  Diagnosis Date  . Hypertension    BP 158/83  Pulse 81  Resp 14  Ht 5\' 8"  (1.727 m)  Wt 220 lb (99.791 kg)  BMI 33.46 kg/m2  SpO2 98%  LMP 04/04/2013     Review of Systems  Constitutional: Positive for unexpected weight change.  Cardiovascular: Positive for leg swelling.  Neurological: Positive for tremors and weakness.  All other systems reviewed and are negative.       Objective:   Physical Exam  General: Alert and oriented x 3, No apparent distress. Obese, round facies  HEENT: Head is normocephalic, atraumatic, PERRLA, EOMI, sclera anicteric, oral mucosa pink and  moist, dentition intact, ext ear canals clear,  Neck: Supple without JVD or lymphadenopathy  Heart: Reg rate and rhythm. No murmurs rubs or gallops  Chest: CTA bilaterally without wheezes, rales, or rhonchi; no distress  Abdomen: Soft, non-tender, non-distended, bowel sounds positive.  Extremities: No clubbing, cyanosis, or edema. Pulses are 2+  Skin: Clean and intact without signs of breakdown  Neuro: Pt is cognitively appropriate with normal insight, memory, and awareness. Cranial nerves 2-12 are intact. Sensory exam is normal. Reflexes are 3+ in lower extremities. Fine motor coordination is intact in UE's. No tremors. Motor function is 1/5 left HF, hip rotators , 3/5 on right3/5 with right KE, 1  with left KE and KF. She had 2+ to 3/5 toe/ankle movement on the right and absent/trace on left. She has gross pain sensation on the right leg, minimal sensation on the left. She displayed 1-2/4 tone at HF and KF, ankles. 4-5, c beats of clonus was seen at the right greater than left ankles, typically triggered by passive movement of legs, Musculoskeletal: Full ROM, No pain with AROM or PROM in the neck, trunk. Good sitting posture. Minimal pain in the intrascapular spinal region. No TP's seen. . The scapula doesn't appear misplaced. Posture appropriate  Psych: Pt's affect is appropriate. Pt is cooperative. She seems to be in good spirits.  Assessment & Plan:   1. 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  2. Neurogenic bowel and bladder.  3. Myofascial pain  4. 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    Plan:  1. Continue tizanidine 4mg  qid. Will consider weaning soon.  2. In two weeks she will increase neurontin to 600mg  TID over one week's time.  3. Continue Leg elevation, stretching to control edema and to help with spasms. Continue with outpt therapy program  4. Oxycodone for breakthrough pain to avoid excess tylenol.  It was refilled today 5. Continue with outpt therapies. I think KAFO's might be appropriate for her.  6. Increase klonopin to 1mg  q8 hours 6. I will see her back in two months 30 minutes of face to face patient care time were spent during this visit. All questions were encouraged and answered.

## 2013-05-05 NOTE — Patient Instructions (Signed)
CALL ME WITH ANY PROBLEMS OR QUESTIONS (#[phone]).  HAVE A GOOD DAY  

## 2013-05-06 ENCOUNTER — Ambulatory Visit: Payer: Medicaid Other | Admitting: Physical Therapy

## 2013-05-08 ENCOUNTER — Ambulatory Visit
Payer: Medicaid Other | Attending: Physical Medicine & Rehabilitation | Admitting: Rehabilitative and Restorative Service Providers"

## 2013-05-08 DIAGNOSIS — M2420 Disorder of ligament, unspecified site: Secondary | ICD-10-CM | POA: Insufficient documentation

## 2013-05-08 DIAGNOSIS — IMO0001 Reserved for inherently not codable concepts without codable children: Secondary | ICD-10-CM | POA: Insufficient documentation

## 2013-05-08 DIAGNOSIS — M629 Disorder of muscle, unspecified: Secondary | ICD-10-CM | POA: Insufficient documentation

## 2013-05-08 DIAGNOSIS — M6281 Muscle weakness (generalized): Secondary | ICD-10-CM | POA: Insufficient documentation

## 2013-05-08 DIAGNOSIS — R5381 Other malaise: Secondary | ICD-10-CM | POA: Insufficient documentation

## 2013-05-13 ENCOUNTER — Ambulatory Visit: Payer: Medicaid Other | Admitting: Physical Therapy

## 2013-05-14 ENCOUNTER — Ambulatory Visit: Payer: Medicaid Other | Admitting: Rehabilitative and Restorative Service Providers"

## 2013-05-15 ENCOUNTER — Ambulatory Visit: Payer: Medicaid Other | Admitting: Rehabilitative and Restorative Service Providers"

## 2013-05-15 ENCOUNTER — Ambulatory Visit: Payer: Medicaid Other | Admitting: Physical Therapy

## 2013-05-16 ENCOUNTER — Ambulatory Visit: Payer: Medicaid Other | Admitting: Rehabilitative and Restorative Service Providers"

## 2013-05-20 ENCOUNTER — Ambulatory Visit: Payer: Medicaid Other | Admitting: Physical Therapy

## 2013-05-22 ENCOUNTER — Telehealth: Payer: Self-pay | Admitting: *Deleted

## 2013-05-22 ENCOUNTER — Ambulatory Visit: Payer: Medicaid Other | Admitting: Rehabilitative and Restorative Service Providers"

## 2013-05-22 NOTE — Telephone Encounter (Signed)
Pharmacy still has her klonopin q 12 hours.  Dr Riley Kill increased to q 8 hrs # 90 on 05/05/13. Attempted to return Debbie [name]'s call but phone never connects.  Klonopin called in to pharmacy based on 05/05/13 rx that was ordered but was on "no print"( so no printed rx and no phone in to pharmacy.)

## 2013-05-26 ENCOUNTER — Encounter: Payer: Self-pay | Admitting: Physical Medicine & Rehabilitation

## 2013-05-26 ENCOUNTER — Encounter (HOSPITAL_BASED_OUTPATIENT_CLINIC_OR_DEPARTMENT_OTHER): Payer: Medicaid Other | Admitting: Physical Medicine & Rehabilitation

## 2013-05-26 VITALS — BP 175/108 | HR 89 | Resp 14 | Ht 68.0 in | Wt 220.0 lb

## 2013-05-26 DIAGNOSIS — M778 Other enthesopathies, not elsewhere classified: Secondary | ICD-10-CM | POA: Insufficient documentation

## 2013-05-26 DIAGNOSIS — G9520 Unspecified cord compression: Secondary | ICD-10-CM

## 2013-05-26 DIAGNOSIS — G959 Disease of spinal cord, unspecified: Secondary | ICD-10-CM

## 2013-05-26 DIAGNOSIS — IMO0002 Reserved for concepts with insufficient information to code with codable children: Secondary | ICD-10-CM

## 2013-05-26 DIAGNOSIS — M6580 Other synovitis and tenosynovitis, unspecified site: Secondary | ICD-10-CM

## 2013-05-26 DIAGNOSIS — IMO0001 Reserved for inherently not codable concepts without codable children: Secondary | ICD-10-CM

## 2013-05-26 DIAGNOSIS — G8220 Paraplegia, unspecified: Secondary | ICD-10-CM

## 2013-05-26 DIAGNOSIS — K592 Neurogenic bowel, not elsewhere classified: Secondary | ICD-10-CM

## 2013-05-26 DIAGNOSIS — N319 Neuromuscular dysfunction of bladder, unspecified: Secondary | ICD-10-CM

## 2013-05-26 MED ORDER — OXYCODONE HCL 5 MG PO TABS: 5.0000 mg | ORAL_TABLET | Freq: Four times a day (QID) | ORAL | Status: DC | PRN

## 2013-05-26 MED ORDER — MELOXICAM 15 MG PO TABS: 15.0000 mg | ORAL_TABLET | Freq: Every day | ORAL | Status: DC

## 2013-05-26 NOTE — Patient Instructions (Addendum)
CALL ME WITH ANY PROBLEMS OR QUESTIONS (#[phone]).  HAVE A GOOD DAY  AFTER ONE WEEK, IF THINGS ARE BETTER FROM A SPASM STANDPOINT, THEN DECREASE THE GABAPETIN TO 300MG  THREE X PER DAY

## 2013-05-26 NOTE — Progress Notes (Signed)
Subjective:    Patient ID: Debbie [name], female    DOB: Jul 02, 1976, 37 y.o.   MRN: [id number]  HPI  Debbie [name] is back regarding her chronic her pain and spastic paraplegia. She ran out of her klonopin early after the increase and the pharmacy did not contact her to inform her that we had called in the rx. She has taken more oxy's as a result. Additionally over the last few days her spasms have been out of control.   Therapy is working with her in regard to her mobility and KAFO's have been ordered.   She is complaining of increased left elbow pain. She typically sleeps on that elbow at night. She doesn't recall any particular initiating incident or injury which triggered the pain.   Pain Inventory Average Pain 8 Pain Right Now 6 My pain is constant, dull, tingling and aching  In the last 24 hours, has pain interfered with the following? General activity 10 Relation with others 10 Enjoyment of life 10 What TIME of day is your pain at its worst? constant Sleep (in general) Poor  Pain is worse with: some activites Pain improves with: pacing activities and medication Relief from Meds: 3  Mobility use a wheelchair needs help with transfers Do you have any goals in this area?  yes  Function Do you have any goals in this area?  yes  Neuro/Psych bladder control problems weakness numbness tremor spasms depression  Prior Studies Any changes since last visit?  no  Physicians involved in your care Any changes since last visit?  no   Family History  Problem Relation Age of Onset  . Multiple sclerosis Father   . Diabetes Mother    History   Social History  . Marital Status: Legally Separated    Spouse Name: N/A    Number of Children: N/A  . Years of Education: N/A   Social History Main Topics  . Smoking status: Former Smoker -- 1.50 packs/day for 10 years    Types: Cigarettes    Quit date: 11/07/2012  . Smokeless tobacco: None  . Alcohol Use: Yes     Comment: occ   . Drug Use: No  . Sexually Active: Yes    Birth Control/ Protection: None   Other Topics Concern  . None   Social History Narrative  . None   Past Surgical History  Procedure Laterality Date  . Fracture surgery    . Cesarean section    . 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 Spine surgery     Past Medical History  Diagnosis Date  . Hypertension    BP 175/108  Pulse 89  Resp 14  Ht 5\' 8"  (1.727 m)  Wt 220 lb (99.791 kg)  BMI 33.46 kg/m2  SpO2 98%  LMP 05/05/2013     Review of Systems  Constitutional: Positive for fever and chills.  Neurological: Positive for tremors, weakness and numbness.  Psychiatric/Behavioral: Positive for dysphoric mood.  All other systems reviewed and are negative.       Objective:   Physical Exam General: Alert and oriented x 3, No apparent distress. Obese, round facies  HEENT: Head is normocephalic, atraumatic, PERRLA, EOMI, sclera anicteric, oral mucosa pink and moist, dentition intact, ext ear canals clear,  Neck: Supple without JVD or lymphadenopathy  Heart:  Reg rate and rhythm. No murmurs rubs or gallops  Chest: CTA bilaterally without wheezes, rales, or rhonchi; no distress  Abdomen: Soft, non-tender, non-distended, bowel sounds positive.  Extremities: No clubbing, cyanosis, or edema. Pulses are 2+  Skin: Clean and intact without signs of breakdown  Neuro: Pt is cognitively appropriate with normal insight, memory, and awareness. Cranial nerves 2-12 are intact. Sensory exam is normal. Reflexes are 3+ in lower extremities. Fine motor coordination is intact in UE's. No tremors. Motor function is 1/5 left HF, hip rotators , 3/5 on right3/5 with right KE, 1 with left KE and KF. She had 2+ to 3/5 toe/ankle movement on the right and absent/trace on  left. She has gross pain sensation on the right leg, minimal sensation on the left. She displayed 2-3/4 tone at HF and KF, ankles. 4-5, c beats of clonus was seen at the right greater than left ankles, typically triggered by passive movement of legs,  Musculoskeletal: Full ROM, No pain with AROM or PROM in the neck, trunk. Good sitting posture. Minimal pain in the intrascapular spinal region. She does have pain at the left elbow. It seems to be more severe along the origin of the brachioradialis. There is also some tenderness along the lateral humeral epicondyl No TP's seen. . The scapula doesn't appear misplaced. Posture appropriate  Psych: Pt's affect is appropriate. Pt is cooperative. She seems to be in good spirits.  Assessment & Plan:   1. 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  2. Neurogenic bowel and bladder.  3. Myofascial pain  4. Spastic paraplegia  5. Left elbow pain, tendonitis--left brachioradialis  6. 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  Plan:  1. Continue tizanidine 4mg  qid. Will consider weaning soon.  2. Will attempt to wean gabapentin a bit to decrease her overall medication load. She will decrease in about a week once her klonopin is back on board and spasms are better controlled.  3. Continue Leg elevation, stretching to control edema and to help with spasms. Continue with outpt therapy program. 4. Oxycodone for breakthrough pain to avoid excess tylenol. It was refilled today  5. Continue with outpt therapies. KAFO's per PT 6. Continue with klonopin to 1mg  q8 hours. Discussed baclofen pump as an option once again.  7.  I will see her back in one month.  30 minutes of face to face patient care time were spent during this visit. All questions were encouraged and answered.  8. Ice, stretching, relative rest for left elbow. Also rxed meloxicam. She should try changing her sleeping position as well.

## 2013-05-27 ENCOUNTER — Ambulatory Visit: Payer: Medicaid Other | Admitting: Physical Therapy

## 2013-05-29 ENCOUNTER — Ambulatory Visit: Payer: Medicaid Other | Admitting: Rehabilitative and Restorative Service Providers"

## 2013-06-13 ENCOUNTER — Ambulatory Visit: Payer: Medicaid Other | Admitting: Rehabilitative and Restorative Service Providers"

## 2013-06-17 ENCOUNTER — Telehealth: Payer: Self-pay

## 2013-06-17 MED ORDER — DIAZEPAM 5 MG PO TABS: 5.0000 mg | ORAL_TABLET | Freq: Three times a day (TID) | ORAL | Status: DC | PRN

## 2013-06-17 NOTE — Telephone Encounter (Signed)
We can try valium, but it should not be used WITH klonopin, it should be used IN PLACE of klonopin.  5mg  q8 prn--i will rx

## 2013-06-17 NOTE — Telephone Encounter (Signed)
Patient called regarding her clonazepam.  She does not feel it is helping her spasms at this point.  She would like to know what else she can do?

## 2013-06-18 ENCOUNTER — Ambulatory Visit: Payer: Medicaid Other | Admitting: Rehabilitative and Restorative Service Providers"

## 2013-06-18 NOTE — Telephone Encounter (Signed)
Calling for Dr Riley Kill reply about klonopin.  I reviewed his response with her and called in the valium as ordered.

## 2013-06-20 ENCOUNTER — Telehealth: Payer: Self-pay

## 2013-06-20 NOTE — Telephone Encounter (Signed)
Patient called about her gabapentin.  She said it needed prior authorization.  Left message informing patient this has been initiated and sent to insurance company.

## 2013-06-23 ENCOUNTER — Telehealth: Payer: Self-pay | Admitting: Physical Medicine & Rehabilitation

## 2013-06-23 DIAGNOSIS — G8220 Paraplegia, unspecified: Secondary | ICD-10-CM

## 2013-06-23 DIAGNOSIS — G9520 Unspecified cord compression: Secondary | ICD-10-CM

## 2013-06-23 DIAGNOSIS — IMO0001 Reserved for inherently not codable concepts without codable children: Secondary | ICD-10-CM

## 2013-06-23 DIAGNOSIS — IMO0002 Reserved for concepts with insufficient information to code with codable children: Secondary | ICD-10-CM

## 2013-06-23 NOTE — Telephone Encounter (Signed)
Debbie [name] called she needs a refill on her pain meds. She needs to talk with someone about her medicine that medicaid want authorized. Please call her [phone].

## 2013-06-25 ENCOUNTER — Ambulatory Visit: Payer: Medicaid Other | Admitting: Rehabilitative and Restorative Service Providers"

## 2013-06-25 MED ORDER — OXYCODONE HCL 5 MG PO TABS: 5.0000 mg | ORAL_TABLET | Freq: Four times a day (QID) | ORAL | Status: DC | PRN

## 2013-06-25 NOTE — Telephone Encounter (Signed)
Patient informed pain script is ready for pick up.  She was also advised that we have requested a prior authorization on her gabepentin but have not heard anything yet.

## 2013-07-02 ENCOUNTER — Encounter: Payer: Self-pay | Admitting: Physical Medicine & Rehabilitation

## 2013-07-02 ENCOUNTER — Encounter: Payer: Medicaid Other | Attending: Physical Medicine & Rehabilitation | Admitting: Physical Medicine & Rehabilitation

## 2013-07-02 VITALS — BP 152/97 | HR 76 | Resp 16 | Ht 68.0 in

## 2013-07-02 DIAGNOSIS — M6580 Other synovitis and tenosynovitis, unspecified site: Secondary | ICD-10-CM

## 2013-07-02 DIAGNOSIS — I10 Essential (primary) hypertension: Secondary | ICD-10-CM | POA: Insufficient documentation

## 2013-07-02 DIAGNOSIS — IMO0002 Reserved for concepts with insufficient information to code with codable children: Secondary | ICD-10-CM

## 2013-07-02 DIAGNOSIS — N319 Neuromuscular dysfunction of bladder, unspecified: Secondary | ICD-10-CM

## 2013-07-02 DIAGNOSIS — K592 Neurogenic bowel, not elsewhere classified: Secondary | ICD-10-CM

## 2013-07-02 DIAGNOSIS — IMO0001 Reserved for inherently not codable concepts without codable children: Secondary | ICD-10-CM

## 2013-07-02 DIAGNOSIS — M778 Other enthesopathies, not elsewhere classified: Secondary | ICD-10-CM

## 2013-07-02 DIAGNOSIS — G8220 Paraplegia, unspecified: Secondary | ICD-10-CM | POA: Insufficient documentation

## 2013-07-02 MED ORDER — GABAPENTIN 300 MG PO CAPS: 600.0000 mg | ORAL_CAPSULE | Freq: Three times a day (TID) | ORAL | Status: DC

## 2013-07-02 MED ORDER — HYDROCHLOROTHIAZIDE 12.5 MG PO CAPS: 12.5000 mg | ORAL_CAPSULE | Freq: Every day | ORAL | Status: DC

## 2013-07-02 NOTE — Progress Notes (Signed)
Subjective:    Patient ID: Debbie [name], female    DOB: Jan 23, 1976, 37 y.o.   MRN: [id number]  HPI  Debbie [name] is back regarding her SCI and associated spastic paraplegia. She tells me that she was doing fairly well until her medicaid denied payment for her gabapentin. We had hoped to wean this, but it turned out that it was actually helping her spasms substantially in combination with the other medications (zanaflex, valium). Robaxin doesn't seem to make much of a difference. We had made a switch to valium in place of her klonopin which was having  She has received her braces which have been helpful. She only has used these in therapy so far, and she has been limited due to the increase in tone.  Bowel and  Bladder function has been at baseline.  Her left elbow has improved with the ice, meloxicam, relative rest. The pain is minimal at best now.    Pain Inventory Average Pain 5 Pain Right Now 5 My pain is dull, stabbing and aching  In the last 24 hours, has pain interfered with the following? General activity 4 Relation with others 5 Enjoyment of life 5 What TIME of day is your pain at its worst? na Sleep (in general) NA  Pain is worse with: na Pain improves with: na Relief from Meds: na  Mobility ability to climb steps?  no do you drive?  no use a wheelchair Do you have any goals in this area?  yes  Function Do you have any goals in this area?  no  Neuro/Psych weakness numbness spasms  Prior Studies Any changes since last visit?  no  Physicians involved in your care Any changes since last visit?  no   Family History  Problem Relation Age of Onset  . Multiple sclerosis Father   . Diabetes Mother    History   Social History  . Marital Status: Legally Separated    Spouse Name: N/A    Number of Children: N/A  . Years of Education: N/A   Social History Main Topics  . Smoking status: Former Smoker -- 1.50 packs/day for 10 years    Types: Cigarettes     Quit date: 11/07/2012  . Smokeless tobacco: None  . Alcohol Use: Yes     Comment: occ  . Drug Use: No  . Sexually Active: Yes    Birth Control/ Protection: None   Other Topics Concern  . None   Social History Narrative  . None   Past Surgical History  Procedure Laterality Date  . Fracture surgery    . Cesarean section    . 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 Spine surgery     Past Medical History  Diagnosis Date  . Hypertension    BP 152/97  Pulse 76  Resp 16  Ht 5\' 8"  (1.727 m)  SpO2 98%  LMP 06/18/2013     Review of Systems  Neurological: Positive for weakness and numbness.       Spasms  All other systems reviewed and are negative.       Objective:   Physical Exam General: Alert and oriented x 3, No apparent distress. Obese, round facies  HEENT: Head is normocephalic, atraumatic, PERRLA, EOMI, sclera anicteric, oral mucosa pink and moist, dentition intact, ext ear canals  clear,  Neck: Supple without JVD or lymphadenopathy  Heart: Reg rate and rhythm. No murmurs rubs or gallops  Chest: CTA bilaterally without wheezes, rales, or rhonchi; no distress  Abdomen: Soft, non-tender, non-distended, bowel sounds positive.  Extremities: No clubbing, cyanosis, or edema. Pulses are 2+  Skin: Clean and intact without signs of breakdown  Neuro: Pt is cognitively appropriate with normal insight, memory, and awareness. Cranial nerves 2-12 are intact. Sensory exam is normal. Reflexes are 3+ in lower extremities. Fine motor coordination is intact in UE's. No tremors. Motor function is 1/5 left HF, hip rotators , 3/5 on right3/5 with right KE, 1 with left KE and KF. She had 2+ to 3/5 toe/ankle movement on the right and absent/trace on left. She has gross pain sensation on the right leg,  minimal sensation on the left. She displayed 3/4 tone at HF and KF, ankles. continued clonus was seen at the right greater than left ankles, typically triggered by passive movement of legs. i was able to break some of her tone by flexing her knees.  Musculoskeletal: Full ROM, No pain with AROM or PROM in the neck, trunk. Good sitting posture. Minimal pain in the intrascapular spinal region. She does have pain at the left elbow. It seems to be more severe along the origin of the brachioradialis. There is also some tenderness along the lateral humeral epicondyl No TP's seen. . The scapula doesn't appear misplaced. Posture appropriate  Psych: Pt's affect is appropriate. Pt is cooperative. She seems to be in good spirits again today.   Assessment & Plan:   1. 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  2. Neurogenic bowel and bladder.  3. Myofascial pain  4. Spastic paraplegia  5. Left elbow pain, tendonitis--left brachioradialis  6. 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    Plan:  1. Continue tizanidine 4mg  qid along with the valium and gabapentin as mentioned below. 2. Will contact [facility] regarding auth for her gabapentin. She feels that the combination including the gabapentin was efficacious. 3. Continue Leg elevation, stretching to control edema and to help with spasms. Continue with outpt therapy program to work on functional gait, ROM, strength.  4. Oxycodone for breakthrough pain to avoid excess tylenol. No RF needed today 5. Continue with outpt therapies. KAFO's per PT  6.  Discussed baclofen pump as a fallback option in the future. I'm hopeful that we can avoid..  7. I will see her back in 2months. 30 minutes of face to face patient care time were spent during this visit. All questions were encouraged and answered.  8. Ice, stretching, relative rest for left elbow as needed.

## 2013-07-02 NOTE — Patient Instructions (Signed)
CALL ME WITH ANY PROBLEMS OR QUESTIONS (#[phone]).  HAVE A GOOD DAY  

## 2013-07-03 ENCOUNTER — Ambulatory Visit
Payer: Medicaid Other | Attending: Physical Medicine & Rehabilitation | Admitting: Rehabilitative and Restorative Service Providers"

## 2013-07-03 DIAGNOSIS — R5381 Other malaise: Secondary | ICD-10-CM | POA: Insufficient documentation

## 2013-07-03 DIAGNOSIS — M629 Disorder of muscle, unspecified: Secondary | ICD-10-CM | POA: Insufficient documentation

## 2013-07-03 DIAGNOSIS — M6281 Muscle weakness (generalized): Secondary | ICD-10-CM | POA: Insufficient documentation

## 2013-07-03 DIAGNOSIS — IMO0001 Reserved for inherently not codable concepts without codable children: Secondary | ICD-10-CM | POA: Insufficient documentation

## 2013-07-03 DIAGNOSIS — M2420 Disorder of ligament, unspecified site: Secondary | ICD-10-CM | POA: Insufficient documentation

## 2013-07-08 ENCOUNTER — Ambulatory Visit: Payer: Medicaid Other | Attending: Physical Medicine & Rehabilitation | Admitting: Physical Therapy

## 2013-07-08 DIAGNOSIS — R5381 Other malaise: Secondary | ICD-10-CM | POA: Insufficient documentation

## 2013-07-08 DIAGNOSIS — M629 Disorder of muscle, unspecified: Secondary | ICD-10-CM | POA: Insufficient documentation

## 2013-07-08 DIAGNOSIS — M6281 Muscle weakness (generalized): Secondary | ICD-10-CM | POA: Insufficient documentation

## 2013-07-08 DIAGNOSIS — M2420 Disorder of ligament, unspecified site: Secondary | ICD-10-CM | POA: Insufficient documentation

## 2013-07-08 DIAGNOSIS — IMO0001 Reserved for inherently not codable concepts without codable children: Secondary | ICD-10-CM | POA: Insufficient documentation

## 2013-07-10 ENCOUNTER — Ambulatory Visit: Payer: Medicaid Other | Admitting: Physical Therapy

## 2013-07-15 ENCOUNTER — Ambulatory Visit: Payer: Medicaid Other | Admitting: Physical Therapy

## 2013-07-18 ENCOUNTER — Ambulatory Visit: Payer: Medicaid Other | Admitting: Physical Therapy

## 2013-07-22 ENCOUNTER — Ambulatory Visit: Payer: Medicaid Other | Admitting: Rehabilitative and Restorative Service Providers"

## 2013-07-30 ENCOUNTER — Telehealth: Payer: Self-pay

## 2013-07-30 DIAGNOSIS — G9520 Unspecified cord compression: Secondary | ICD-10-CM

## 2013-07-30 DIAGNOSIS — G8220 Paraplegia, unspecified: Secondary | ICD-10-CM

## 2013-07-30 DIAGNOSIS — IMO0002 Reserved for concepts with insufficient information to code with codable children: Secondary | ICD-10-CM

## 2013-07-30 DIAGNOSIS — IMO0001 Reserved for inherently not codable concepts without codable children: Secondary | ICD-10-CM

## 2013-07-30 NOTE — Telephone Encounter (Signed)
Patient called requesting medication refill.  Did not specify which medication.

## 2013-07-31 MED ORDER — OXYCODONE HCL 5 MG PO TABS: 5.0000 mg | ORAL_TABLET | Freq: Four times a day (QID) | ORAL | Status: DC | PRN

## 2013-07-31 NOTE — Telephone Encounter (Signed)
Left patient a voicemail to call to clinic back and let us know which medication she needed refilled.

## 2013-07-31 NOTE — Telephone Encounter (Signed)
rx for oxycodone printed for Dr Riley Kill to sign. Last fill 07/02/13 #90

## 2013-08-06 ENCOUNTER — Ambulatory Visit: Payer: Medicaid Other | Admitting: Physical Therapy

## 2013-08-11 ENCOUNTER — Ambulatory Visit: Payer: Medicaid Other | Attending: Physical Medicine & Rehabilitation | Admitting: Physical Therapy

## 2013-08-11 DIAGNOSIS — M629 Disorder of muscle, unspecified: Secondary | ICD-10-CM | POA: Insufficient documentation

## 2013-08-11 DIAGNOSIS — M6281 Muscle weakness (generalized): Secondary | ICD-10-CM | POA: Insufficient documentation

## 2013-08-11 DIAGNOSIS — IMO0001 Reserved for inherently not codable concepts without codable children: Secondary | ICD-10-CM | POA: Insufficient documentation

## 2013-08-11 DIAGNOSIS — R5381 Other malaise: Secondary | ICD-10-CM | POA: Insufficient documentation

## 2013-08-11 DIAGNOSIS — M2420 Disorder of ligament, unspecified site: Secondary | ICD-10-CM | POA: Insufficient documentation

## 2013-08-14 ENCOUNTER — Telehealth: Payer: Self-pay

## 2013-08-14 NOTE — Telephone Encounter (Signed)
Patient requesting a refill on oxycodone and lisinopril.

## 2013-08-14 NOTE — Telephone Encounter (Signed)
Left message informing patient her oxycodone refill has been ready since 07/31/13 to pick up.  Also advised her we do not refill blood pressure medication.

## 2013-08-19 ENCOUNTER — Telehealth: Payer: Self-pay

## 2013-08-19 MED ORDER — DIAZEPAM 5 MG PO TABS: 5.0000 mg | ORAL_TABLET | Freq: Three times a day (TID) | ORAL | Status: DC | PRN

## 2013-08-19 NOTE — Telephone Encounter (Signed)
Diazepam refilled.  Patient aware.

## 2013-08-19 NOTE — Telephone Encounter (Signed)
Patient requesting a refill on Diazepam.

## 2013-08-26 ENCOUNTER — Ambulatory Visit: Payer: Medicaid Other | Admitting: Rehabilitative and Restorative Service Providers"

## 2013-09-01 ENCOUNTER — Encounter: Payer: Medicaid Other | Attending: Physical Medicine & Rehabilitation | Admitting: Physical Medicine & Rehabilitation

## 2013-09-01 DIAGNOSIS — I10 Essential (primary) hypertension: Secondary | ICD-10-CM | POA: Insufficient documentation

## 2013-09-01 DIAGNOSIS — G8220 Paraplegia, unspecified: Secondary | ICD-10-CM | POA: Insufficient documentation

## 2013-09-04 ENCOUNTER — Ambulatory Visit
Payer: Medicaid Other | Attending: Physical Medicine & Rehabilitation | Admitting: Rehabilitative and Restorative Service Providers"

## 2013-09-04 DIAGNOSIS — IMO0001 Reserved for inherently not codable concepts without codable children: Secondary | ICD-10-CM | POA: Insufficient documentation

## 2013-09-04 DIAGNOSIS — M629 Disorder of muscle, unspecified: Secondary | ICD-10-CM | POA: Insufficient documentation

## 2013-09-04 DIAGNOSIS — M6281 Muscle weakness (generalized): Secondary | ICD-10-CM | POA: Insufficient documentation

## 2013-09-04 DIAGNOSIS — M2420 Disorder of ligament, unspecified site: Secondary | ICD-10-CM | POA: Insufficient documentation

## 2013-09-04 DIAGNOSIS — R5381 Other malaise: Secondary | ICD-10-CM | POA: Insufficient documentation

## 2013-09-19 ENCOUNTER — Telehealth: Payer: Self-pay

## 2013-09-19 DIAGNOSIS — IMO0001 Reserved for inherently not codable concepts without codable children: Secondary | ICD-10-CM

## 2013-09-19 DIAGNOSIS — K219 Gastro-esophageal reflux disease without esophagitis: Secondary | ICD-10-CM

## 2013-09-19 DIAGNOSIS — IMO0002 Reserved for concepts with insufficient information to code with codable children: Secondary | ICD-10-CM

## 2013-09-19 DIAGNOSIS — G9520 Unspecified cord compression: Secondary | ICD-10-CM

## 2013-09-19 DIAGNOSIS — G8220 Paraplegia, unspecified: Secondary | ICD-10-CM

## 2013-09-19 MED ORDER — DIAZEPAM 5 MG PO TABS: 5.0000 mg | ORAL_TABLET | Freq: Three times a day (TID) | ORAL | Status: DC | PRN

## 2013-09-19 MED ORDER — PANTOPRAZOLE SODIUM 40 MG PO TBEC: 40.0000 mg | DELAYED_RELEASE_TABLET | Freq: Every day | ORAL | Status: DC

## 2013-09-19 NOTE — Telephone Encounter (Signed)
done

## 2013-09-19 NOTE — Telephone Encounter (Signed)
Patient is requesting a refill on Diazepam and Protonix.

## 2013-09-23 ENCOUNTER — Telehealth: Payer: Self-pay

## 2013-09-23 NOTE — Telephone Encounter (Signed)
Patient requesting a refill on Zanaflex.

## 2013-09-23 NOTE — Telephone Encounter (Signed)
Patient has not been seen since July. Patient needs to make appt before medication can be refilled. Patient is aware.

## 2013-09-29 ENCOUNTER — Ambulatory Visit: Payer: Medicaid Other | Attending: Physical Medicine & Rehabilitation | Admitting: Physical Therapy

## 2013-09-29 DIAGNOSIS — M629 Disorder of muscle, unspecified: Secondary | ICD-10-CM | POA: Insufficient documentation

## 2013-09-29 DIAGNOSIS — M2420 Disorder of ligament, unspecified site: Secondary | ICD-10-CM | POA: Insufficient documentation

## 2013-09-29 DIAGNOSIS — IMO0001 Reserved for inherently not codable concepts without codable children: Secondary | ICD-10-CM | POA: Insufficient documentation

## 2013-09-29 DIAGNOSIS — M6281 Muscle weakness (generalized): Secondary | ICD-10-CM | POA: Insufficient documentation

## 2013-09-29 DIAGNOSIS — R5381 Other malaise: Secondary | ICD-10-CM | POA: Insufficient documentation

## 2013-10-01 ENCOUNTER — Encounter: Payer: Medicaid Other | Admitting: Physical Medicine & Rehabilitation

## 2013-10-01 ENCOUNTER — Ambulatory Visit: Payer: Medicaid Other | Admitting: Rehabilitative and Restorative Service Providers"

## 2013-10-07 ENCOUNTER — Ambulatory Visit: Payer: Medicaid Other | Attending: Physical Medicine & Rehabilitation | Admitting: Physical Therapy

## 2013-10-07 DIAGNOSIS — M629 Disorder of muscle, unspecified: Secondary | ICD-10-CM | POA: Insufficient documentation

## 2013-10-07 DIAGNOSIS — M6281 Muscle weakness (generalized): Secondary | ICD-10-CM | POA: Insufficient documentation

## 2013-10-07 DIAGNOSIS — R5381 Other malaise: Secondary | ICD-10-CM | POA: Insufficient documentation

## 2013-10-07 DIAGNOSIS — IMO0001 Reserved for inherently not codable concepts without codable children: Secondary | ICD-10-CM | POA: Insufficient documentation

## 2013-10-07 DIAGNOSIS — M2420 Disorder of ligament, unspecified site: Secondary | ICD-10-CM | POA: Insufficient documentation

## 2013-10-09 ENCOUNTER — Ambulatory Visit: Payer: Medicaid Other | Admitting: Physical Therapy

## 2013-10-10 ENCOUNTER — Encounter: Payer: Medicaid Other | Attending: Physical Medicine & Rehabilitation | Admitting: Physical Medicine & Rehabilitation

## 2013-10-10 ENCOUNTER — Encounter (INDEPENDENT_AMBULATORY_CARE_PROVIDER_SITE_OTHER): Payer: Self-pay

## 2013-10-10 ENCOUNTER — Encounter: Payer: Self-pay | Admitting: Physical Medicine & Rehabilitation

## 2013-10-10 VITALS — BP 155/91 | HR 88 | Resp 14 | Ht 68.0 in | Wt 220.0 lb

## 2013-10-10 DIAGNOSIS — IMO0002 Reserved for concepts with insufficient information to code with codable children: Secondary | ICD-10-CM

## 2013-10-10 DIAGNOSIS — G8220 Paraplegia, unspecified: Secondary | ICD-10-CM

## 2013-10-10 DIAGNOSIS — Z79899 Other long term (current) drug therapy: Secondary | ICD-10-CM

## 2013-10-10 DIAGNOSIS — K592 Neurogenic bowel, not elsewhere classified: Secondary | ICD-10-CM

## 2013-10-10 DIAGNOSIS — IMO0001 Reserved for inherently not codable concepts without codable children: Secondary | ICD-10-CM

## 2013-10-10 DIAGNOSIS — N319 Neuromuscular dysfunction of bladder, unspecified: Secondary | ICD-10-CM

## 2013-10-10 DIAGNOSIS — G959 Disease of spinal cord, unspecified: Secondary | ICD-10-CM

## 2013-10-10 DIAGNOSIS — G9520 Unspecified cord compression: Secondary | ICD-10-CM

## 2013-10-10 DIAGNOSIS — M6580 Other synovitis and tenosynovitis, unspecified site: Secondary | ICD-10-CM | POA: Insufficient documentation

## 2013-10-10 DIAGNOSIS — G8110 Spastic hemiplegia affecting unspecified side: Secondary | ICD-10-CM | POA: Insufficient documentation

## 2013-10-10 DIAGNOSIS — Z5181 Encounter for therapeutic drug level monitoring: Secondary | ICD-10-CM

## 2013-10-10 MED ORDER — TIZANIDINE HCL 4 MG PO TABS: 6.0000 mg | ORAL_TABLET | Freq: Four times a day (QID) | ORAL | Status: DC

## 2013-10-10 MED ORDER — OXYCODONE HCL 5 MG PO TABS: 5.0000 mg | ORAL_TABLET | Freq: Four times a day (QID) | ORAL | Status: DC | PRN

## 2013-10-10 NOTE — Patient Instructions (Signed)
CALL ME WITH ANY PROBLEMS OR QUESTIONS (#[phone]).  HAVE A GOOD DAY  

## 2013-10-10 NOTE — Progress Notes (Signed)
Subjective:    Patient ID: Debbie [name], female    DOB: May 17, 1976, 37 y.o.   MRN: [id number]  HPI  Debbie [name] is back regarding her spastic paraplegia. She states that the tizanidine tends to work fairly well, but she ran out of it recently.   The gabapentin has been helpful for her pain. She takes it two to three x per day.   She is complaining of a left side pain that she feel may be a little worse after therapies.   Her bladder remains overactive.   Pain Inventory Average Pain 5 Pain Right Now 5 My pain is constant, stabbing and aching  In the last 24 hours, has pain interfered with the following? General activity 5 Relation with others 10 Enjoyment of life 10 What TIME of day is your pain at its worst? evening Sleep (in general) Fair  Pain is worse with: some activites Pain improves with: therapy/exercise and medication Relief from Meds: 7  Mobility use a wheelchair needs help with transfers  Function disabled: date disabled . I need assistance with the following:  bathing, meal prep, household duties and shopping  Neuro/Psych weakness numbness spasms  Prior Studies Any changes since last visit?  no  Physicians involved in your care Any changes since last visit?  no   Family History  Problem Relation Age of Onset  . Multiple sclerosis Father   . Diabetes Mother    History   Social History  . Marital Status: Legally Separated    Spouse Name: N/A    Number of Children: N/A  . Years of Education: N/A   Social History Main Topics  . Smoking status: Former Smoker -- 1.50 packs/day for 10 years    Types: Cigarettes    Quit date: 11/07/2012  . Smokeless tobacco: None  . Alcohol Use: Yes     Comment: occ  . Drug Use: No  . Sexual Activity: Yes    Birth Control/ Protection: None   Other Topics Concern  . None   Social History Narrative  . None   Past Surgical History  Procedure Laterality Date  . Fracture surgery    . Cesarean section    .  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 Spine surgery     Past Medical History  Diagnosis Date  . Hypertension    BP 155/91  Pulse 88  Resp 14  Ht 5\' 8"  (1.727 m)  Wt 220 lb (99.791 kg)  BMI 33.46 kg/m2  SpO2 98%     Review of Systems  Musculoskeletal: Positive for gait problem.       Spasms  Neurological: Positive for weakness and numbness.  Psychiatric/Behavioral: The patient is nervous/anxious.   All other systems reviewed and are negative.       Objective:   Physical Exam  General: Alert and oriented x 3, No apparent distress. Obese, round facies  HEENT: Head is normocephalic, atraumatic, PERRLA, EOMI, sclera anicteric, oral mucosa pink and moist, dentition intact, ext ear canals clear,  Neck: Supple without JVD or lymphadenopathy  Heart: Reg rate and rhythm. No murmurs rubs or gallops  Chest: CTA bilaterally without wheezes, rales, or rhonchi; no distress  Abdomen: Soft, non-tender, non-distended, bowel sounds positive.  Extremities: No clubbing, cyanosis, or edema. Pulses are 2+  Skin: Clean and intact  without signs of breakdown  Neuro: Pt is cognitively appropriate with normal insight, memory, and awareness. Cranial nerves 2-12 are intact. Sensory exam is normal. Reflexes are 3+ in lower extremities. Fine motor coordination is intact in UE's. No tremors. Motor function is 1/5 left HF, hip rotators , 3/5 on right3/5 with right KE, 1 with left KE and KF. She had 2+ to 3/5 toe/ankle movement on the right and absent/trace on left. She has gross pain sensation on the right leg, minimal sensation on the left. She displayed continue 3/4 tone at HF and KF, ankles. Right more than left. sustained clonus was seen at the right greater than left ankles, typically triggered by passive movement of  legs. i was able to break some of her tone by flexing her knees.  Musculoskeletal: Full ROM, No pain with AROM or PROM in the neck, trunk. Good sitting posture. Minimal pain in the intrascapular spinal region. She does have pain at the left elbow. It seems to be more severe along the origin of the brachioradialis. There is also some tenderness along the lateral humeral epicondyl No TP's seen. . The scapula doesn't appear misplaced. Posture appropriate  Psych: Pt's affect is appropriate. Pt is cooperative. She seems to be in good spirits again today.   Assessment & Plan:   1. 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  2. Neurogenic bowel and bladder.  3. Myofascial pain  4. Spastic paraplegia  5. Left elbow pain, tendonitis--left brachioradialis  6. 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    Plan:  1. Increase tizanidine to 6mg  qid along with the valium and gabapentin.  2. Continue  gabapentin.  .  3. Will arrange botox to bilateral gastroc, soleus . i think some of her proximal tone is actually helping her with mobility, gait 4. Oxycodone for breakthrough pain to avoid excess tylenol.  RF needed today  5. Continue with outpt therapies. KAFO's per PT  6. ?baclofen pump at some point  7. I will see her back in about a months. 30 minutes of face to face patient care time were spent during this visit. All questions were encouraged and answered.

## 2013-10-14 ENCOUNTER — Ambulatory Visit: Payer: Medicaid Other | Admitting: Rehabilitative and Restorative Service Providers"

## 2013-10-17 ENCOUNTER — Ambulatory Visit: Payer: Medicaid Other | Admitting: Physical Therapy

## 2013-10-21 ENCOUNTER — Ambulatory Visit: Payer: Medicaid Other | Admitting: Rehabilitative and Restorative Service Providers"

## 2013-10-21 ENCOUNTER — Other Ambulatory Visit: Payer: Self-pay

## 2013-10-21 MED ORDER — DIAZEPAM 5 MG PO TABS: 5.0000 mg | ORAL_TABLET | Freq: Three times a day (TID) | ORAL | Status: DC | PRN

## 2013-10-21 NOTE — Telephone Encounter (Signed)
Patient request valium refill.  Refill called into [facility].  Patient aware.

## 2013-10-22 ENCOUNTER — Telehealth: Payer: Self-pay

## 2013-10-22 NOTE — Telephone Encounter (Signed)
Valium called in.  Spoke with pharmacist.

## 2013-10-22 NOTE — Telephone Encounter (Signed)
[facility] pharmacy called to let us know they did not accept the call in for patient valium rx.  They say the FDA does not allow controlled substance to be left on voicemail and that we need to talk to someone.

## 2013-10-23 ENCOUNTER — Ambulatory Visit: Payer: Medicaid Other | Admitting: Physical Therapy

## 2013-10-24 ENCOUNTER — Ambulatory Visit: Payer: Medicaid Other | Admitting: Rehabilitative and Restorative Service Providers"

## 2013-10-27 ENCOUNTER — Ambulatory Visit: Payer: Medicaid Other | Admitting: Physical Therapy

## 2013-10-28 ENCOUNTER — Ambulatory Visit: Payer: Medicaid Other | Admitting: Physical Therapy

## 2013-11-03 ENCOUNTER — Ambulatory Visit
Payer: Medicaid Other | Attending: Physical Medicine & Rehabilitation | Admitting: Rehabilitative and Restorative Service Providers"

## 2013-11-03 DIAGNOSIS — R5381 Other malaise: Secondary | ICD-10-CM | POA: Insufficient documentation

## 2013-11-03 DIAGNOSIS — M629 Disorder of muscle, unspecified: Secondary | ICD-10-CM | POA: Insufficient documentation

## 2013-11-03 DIAGNOSIS — M2420 Disorder of ligament, unspecified site: Secondary | ICD-10-CM | POA: Insufficient documentation

## 2013-11-03 DIAGNOSIS — M6281 Muscle weakness (generalized): Secondary | ICD-10-CM | POA: Insufficient documentation

## 2013-11-03 DIAGNOSIS — IMO0001 Reserved for inherently not codable concepts without codable children: Secondary | ICD-10-CM | POA: Insufficient documentation

## 2013-11-05 ENCOUNTER — Ambulatory Visit: Payer: Medicaid Other | Admitting: Physical Therapy

## 2013-11-10 ENCOUNTER — Ambulatory Visit: Payer: Medicaid Other | Admitting: Physical Therapy

## 2013-11-12 ENCOUNTER — Ambulatory Visit: Payer: Medicaid Other | Admitting: Physical Therapy

## 2013-11-12 ENCOUNTER — Encounter: Payer: Medicaid Other | Attending: Physical Medicine & Rehabilitation | Admitting: Physical Medicine & Rehabilitation

## 2013-11-12 ENCOUNTER — Encounter: Payer: Self-pay | Admitting: Physical Medicine & Rehabilitation

## 2013-11-12 VITALS — BP 111/67 | HR 52 | Resp 14 | Ht 67.0 in | Wt 220.0 lb

## 2013-11-12 DIAGNOSIS — G959 Disease of spinal cord, unspecified: Secondary | ICD-10-CM

## 2013-11-12 DIAGNOSIS — I10 Essential (primary) hypertension: Secondary | ICD-10-CM | POA: Insufficient documentation

## 2013-11-12 DIAGNOSIS — G8220 Paraplegia, unspecified: Secondary | ICD-10-CM | POA: Insufficient documentation

## 2013-11-12 DIAGNOSIS — G9520 Unspecified cord compression: Secondary | ICD-10-CM

## 2013-11-12 DIAGNOSIS — IMO0002 Reserved for concepts with insufficient information to code with codable children: Secondary | ICD-10-CM

## 2013-11-12 NOTE — Patient Instructions (Signed)
CALL ME WITH ANY PROBLEMS OR QUESTIONS (#[phone]).    HAPPY HOLIDAYS!!!!!   

## 2013-11-12 NOTE — Progress Notes (Addendum)
Subjective:    Patient ID: Debbie [name], female    DOB: 04/16/1976, 37 y.o.   MRN: [id number]  HPI  Debbie [name] is here for botox injections to her legs.   Pain Inventory Average Pain 5 Pain Right Now 4 My pain is constant and aching  In the last 24 hours, has pain interfered with the following? General activity 6 Relation with others 5 Enjoyment of life 10 What TIME of day is your pain at its worst? all day Sleep (in general) Fair  Pain is worse with: other Pain improves with: therapy/exercise Relief from Meds: 3  Mobility ability to climb steps?  no do you drive?  no use a wheelchair needs help with transfers transfers alone Do you have any goals in this area?  yes  Function I need assistance with the following:  toileting Do you have any goals in this area?  yes  Neuro/Psych bladder control problems bowel control problems tingling spasms  Prior Studies Any changes since last visit?  no  Physicians involved in your care Any changes since last visit?  no   Family History  Problem Relation Age of Onset  . Multiple sclerosis Father   . Diabetes Mother    History   Social History  . Marital Status: Legally Separated    Spouse Name: N/A    Number of Children: N/A  . Years of Education: N/A   Social History Main Topics  . Smoking status: Former Smoker -- 1.50 packs/day for 10 years    Types: Cigarettes    Quit date: 11/07/2012  . Smokeless tobacco: None  . Alcohol Use: Yes     Comment: occ  . Drug Use: No  . Sexual Activity: Yes    Birth Control/ Protection: None   Other Topics Concern  . None   Social History Narrative  . None   Past Surgical History  Procedure Laterality Date  . Fracture surgery    . Cesarean section    . Laminectomy  11/11/2012    Procedure: THORACIC LAMINECTOMY FOR TUMOR;  Surgeon: Temple Pacini, MD;  Location: [facility];  Service: Neurosurgery;  Laterality: N/A;  thracic laminectomy for intradural and intramedullary  neoplasm  . Spine surgery     Past Medical History  Diagnosis Date  . Hypertension    BP 111/67  Pulse 52  Resp 14  Ht 5\' 7"  (1.702 m)  Wt 220 lb (99.791 kg)  BMI 34.45 kg/m2  SpO2 99%      Review of Systems  Genitourinary:       Bowel and bladder control problems  Neurological:       Tingling, spasms       Objective:   Physical Exam   See previous. Spasticity in bilateral gastroc,soleus          Botox Injection for spasticity using needle EMG guidance Indication: spastic paraparesis  Dilution: 100 Units/ml        Total Units Injected: 300 units to bilateral legs. Indication: Severe spasticity which interferes with ADL,mobility and/or  hygiene and is unresponsive to medication management and other conservative care Informed consent was obtained after describing risks and benefits of the procedure with the patient. This includes bleeding, bruising, infection, excessive weakness, or medication side effects. A REMS form is on file and signed.  Needle: 50mm injectable monopolar needle electrode  Gastroc/soleus 200 units divided into 4 different access points over each limb   Tibialis Posterior 100 units divided into 2 access  points over eac limb   All injections were done after obtaining appropriate EMG activity and after negative drawback for blood. The patient tolerated the procedure well. Post procedure instructions were given. A followup appointment was made.

## 2013-11-17 ENCOUNTER — Ambulatory Visit: Payer: Medicaid Other | Admitting: Physical Therapy

## 2013-11-19 ENCOUNTER — Ambulatory Visit: Payer: Medicaid Other | Admitting: Physical Therapy

## 2013-11-24 ENCOUNTER — Ambulatory Visit: Payer: Medicaid Other | Admitting: Rehabilitative and Restorative Service Providers"

## 2013-11-24 ENCOUNTER — Telehealth: Payer: Self-pay

## 2013-11-24 DIAGNOSIS — IMO0002 Reserved for concepts with insufficient information to code with codable children: Secondary | ICD-10-CM

## 2013-11-24 MED ORDER — GABAPENTIN 300 MG PO CAPS: 600.0000 mg | ORAL_CAPSULE | Freq: Three times a day (TID) | ORAL | Status: DC

## 2013-11-24 NOTE — Telephone Encounter (Signed)
Patient is requesting a refill on gabapentin. Refill sent to pharmacy.

## 2013-11-26 ENCOUNTER — Ambulatory Visit: Payer: PRIVATE HEALTH INSURANCE | Admitting: Rehabilitative and Restorative Service Providers"

## 2013-11-26 ENCOUNTER — Emergency Department (HOSPITAL_COMMUNITY): Payer: Medicaid Other

## 2013-11-26 ENCOUNTER — Inpatient Hospital Stay (HOSPITAL_COMMUNITY)
Admission: EM | Admit: 2013-11-26 | Discharge: 2013-12-01 | DRG: 871 | Disposition: A | Discharge status: Home / Self Care | Payer: Medicaid Other | Attending: Family Medicine | Admitting: Family Medicine

## 2013-11-26 ENCOUNTER — Encounter (HOSPITAL_COMMUNITY): Payer: Self-pay | Admitting: Emergency Medicine

## 2013-11-26 DIAGNOSIS — G959 Disease of spinal cord, unspecified: Secondary | ICD-10-CM

## 2013-11-26 DIAGNOSIS — I10 Essential (primary) hypertension: Secondary | ICD-10-CM

## 2013-11-26 DIAGNOSIS — G839 Paralytic syndrome, unspecified: Secondary | ICD-10-CM | POA: Diagnosis present

## 2013-11-26 DIAGNOSIS — E876 Hypokalemia: Secondary | ICD-10-CM

## 2013-11-26 DIAGNOSIS — A419 Sepsis, unspecified organism: Principal | ICD-10-CM

## 2013-11-26 DIAGNOSIS — J189 Pneumonia, unspecified organism: Secondary | ICD-10-CM

## 2013-11-26 DIAGNOSIS — J020 Streptococcal pharyngitis: Secondary | ICD-10-CM

## 2013-11-26 DIAGNOSIS — Z87891 Personal history of nicotine dependence: Secondary | ICD-10-CM

## 2013-11-26 DIAGNOSIS — I959 Hypotension, unspecified: Secondary | ICD-10-CM

## 2013-11-26 DIAGNOSIS — G8220 Paraplegia, unspecified: Secondary | ICD-10-CM | POA: Diagnosis present

## 2013-11-26 DIAGNOSIS — Z833 Family history of diabetes mellitus: Secondary | ICD-10-CM

## 2013-11-26 DIAGNOSIS — N393 Stress incontinence (female) (male): Secondary | ICD-10-CM | POA: Diagnosis present

## 2013-11-26 DIAGNOSIS — G8929 Other chronic pain: Secondary | ICD-10-CM | POA: Diagnosis present

## 2013-11-26 DIAGNOSIS — K219 Gastro-esophageal reflux disease without esophagitis: Secondary | ICD-10-CM

## 2013-11-26 DIAGNOSIS — N390 Urinary tract infection, site not specified: Secondary | ICD-10-CM

## 2013-11-26 DIAGNOSIS — N319 Neuromuscular dysfunction of bladder, unspecified: Secondary | ICD-10-CM | POA: Diagnosis present

## 2013-11-26 DIAGNOSIS — K592 Neurogenic bowel, not elsewhere classified: Secondary | ICD-10-CM | POA: Diagnosis present

## 2013-11-26 HISTORY — DX: Myalgia, other site: M79.18

## 2013-11-26 HISTORY — DX: Reserved for concepts with insufficient information to code with codable children: IMO0002

## 2013-11-26 HISTORY — DX: Neuromuscular dysfunction of bladder, unspecified: N31.9

## 2013-11-26 HISTORY — DX: Localized swelling, mass and lump, unspecified: R22.9

## 2013-11-26 HISTORY — DX: Other chronic pain: G89.29

## 2013-11-26 LAB — CBC WITH DIFFERENTIAL/PLATELET
Basophils Relative: 0 % (ref 0–1)
HCT: 35.4 % — ABNORMAL LOW (ref 36.0–46.0)
Hemoglobin: 11.8 g/dL — ABNORMAL LOW (ref 12.0–15.0)
Lymphocytes Relative: 5 % — ABNORMAL LOW (ref 12–46)
Lymphs Abs: 0.9 10*3/uL (ref 0.7–4.0)
MCHC: 33.3 g/dL (ref 30.0–36.0)
Monocytes Relative: 5 % (ref 3–12)
Neutro Abs: 15.6 10*3/uL — ABNORMAL HIGH (ref 1.7–7.7)
Neutrophils Relative %: 90 % — ABNORMAL HIGH (ref 43–77)
Platelets: 205 10*3/uL (ref 150–400)
RBC: 4.05 MIL/uL (ref 3.87–5.11)
WBC: 17.4 10*3/uL — ABNORMAL HIGH (ref 4.0–10.5)

## 2013-11-26 LAB — ETHANOL: Alcohol, Ethyl (B): <11 mg/dL (ref 0–11)

## 2013-11-26 LAB — RAPID URINE DRUG SCREEN, HOSP PERFORMED
Amphetamines: NOT DETECTED
Barbiturates: NOT DETECTED
Benzodiazepines: POSITIVE — AB
Opiates: NOT DETECTED
Tetrahydrocannabinol: NOT DETECTED

## 2013-11-26 LAB — COMPREHENSIVE METABOLIC PANEL
AST: 8 U/L (ref 0–37)
Albumin: 3.4 g/dL — ABNORMAL LOW (ref 3.5–5.2)
Alkaline Phosphatase: 84 U/L (ref 39–117)
BUN: 19 mg/dL (ref 6–23)
CO2: 21 mEq/L (ref 19–32)
Chloride: 99 mEq/L (ref 96–112)
GFR calc non Af Amer: 52 mL/min — ABNORMAL LOW (ref >90)
Potassium: 2.9 mEq/L — ABNORMAL LOW (ref 3.5–5.1)
Sodium: 137 mEq/L (ref 135–145)
Total Bilirubin: 0.6 mg/dL (ref 0.3–1.2)

## 2013-11-26 LAB — URINALYSIS W MICROSCOPIC + REFLEX CULTURE
Bilirubin Urine: NEGATIVE
Glucose, UA: NEGATIVE mg/dL
Hgb urine dipstick: NEGATIVE
Ketones, ur: NEGATIVE mg/dL
Urobilinogen, UA: 1 mg/dL (ref 0.0–1.0)

## 2013-11-26 LAB — RAPID STREP SCREEN (MED CTR MEBANE ONLY): Streptococcus, Group A Screen (Direct): POSITIVE — AB

## 2013-11-26 LAB — PREGNANCY, URINE: Preg Test, Ur: NEGATIVE

## 2013-11-26 LAB — LACTIC ACID, PLASMA: Lactic Acid, Venous: 1.7 mmol/L (ref 0.5–2.2)

## 2013-11-26 LAB — INFLUENZA PANEL BY PCR (TYPE A & B): H1N1 flu by pcr: NOT DETECTED

## 2013-11-26 MED ORDER — DEXTROSE 5 % IV SOLN
500.0000 mg | INTRAVENOUS | Status: DC
  Administered 2013-11-27 – 2013-11-30 (×4): 500 mg via INTRAVENOUS
  Filled 2013-11-26 (×4): qty 500

## 2013-11-26 MED ORDER — PANTOPRAZOLE SODIUM 40 MG PO TBEC
40.0000 mg | DELAYED_RELEASE_TABLET | Freq: Every day | ORAL | Status: DC
  Administered 2013-11-26 – 2013-12-01 (×6): 40 mg via ORAL
  Filled 2013-11-26 (×6): qty 1

## 2013-11-26 MED ORDER — HEPARIN SODIUM (PORCINE) 5000 UNIT/ML IJ SOLN
5000.0000 [IU] | Freq: Three times a day (TID) | INTRAMUSCULAR | Status: DC
  Administered 2013-11-26 – 2013-12-01 (×15): 5000 [IU] via SUBCUTANEOUS
  Filled 2013-11-26 (×15): qty 1

## 2013-11-26 MED ORDER — DEXTROSE 5 % IV SOLN
INTRAVENOUS | Status: AC
  Filled 2013-11-26: qty 10

## 2013-11-26 MED ORDER — GUAIFENESIN-DM 100-10 MG/5ML PO SYRP: 5.0000 mL | ORAL_SOLUTION | ORAL | Status: DC | PRN

## 2013-11-26 MED ORDER — GABAPENTIN 300 MG PO CAPS
600.0000 mg | ORAL_CAPSULE | Freq: Three times a day (TID) | ORAL | Status: DC
  Administered 2013-11-26 – 2013-12-01 (×14): 600 mg via ORAL
  Filled 2013-11-26 (×19): qty 2

## 2013-11-26 MED ORDER — ONDANSETRON HCL 4 MG/2ML IJ SOLN: 4.0000 mg | Freq: Four times a day (QID) | INTRAMUSCULAR | Status: DC | PRN

## 2013-11-26 MED ORDER — OSELTAMIVIR PHOSPHATE 75 MG PO CAPS: 75.0000 mg | ORAL_CAPSULE | Freq: Every day | ORAL | Status: DC

## 2013-11-26 MED ORDER — HYDROCODONE-ACETAMINOPHEN 5-325 MG PO TABS: 1.0000 | ORAL_TABLET | ORAL | Status: DC | PRN

## 2013-11-26 MED ORDER — TIZANIDINE HCL 4 MG PO TABS
ORAL_TABLET | ORAL | Status: AC
  Filled 2013-11-26: qty 2

## 2013-11-26 MED ORDER — DEXTROSE 5 % IV SOLN
500.0000 mg | INTRAVENOUS | Status: DC
  Filled 2013-11-26 (×2): qty 500

## 2013-11-26 MED ORDER — DEXTROSE 5 % IV SOLN: 1.0000 g | Freq: Once | INTRAVENOUS | Status: AC

## 2013-11-26 MED ORDER — POTASSIUM CHLORIDE 10 MEQ/100ML IV SOLN
10.0000 meq | Freq: Once | INTRAVENOUS | Status: AC
  Administered 2013-11-26: 10 meq via INTRAVENOUS
  Filled 2013-11-26: qty 100

## 2013-11-26 MED ORDER — DIAZEPAM 5 MG PO TABS: 5.0000 mg | ORAL_TABLET | Freq: Three times a day (TID) | ORAL | Status: DC | PRN

## 2013-11-26 MED ORDER — SODIUM CHLORIDE 0.9 % IJ SOLN
3.0000 mL | Freq: Two times a day (BID) | INTRAMUSCULAR | Status: DC
  Administered 2013-11-27 – 2013-12-01 (×4): 3 mL via INTRAVENOUS

## 2013-11-26 MED ORDER — ONDANSETRON HCL 4 MG PO TABS: 4.0000 mg | ORAL_TABLET | Freq: Four times a day (QID) | ORAL | Status: DC | PRN

## 2013-11-26 MED ORDER — TIZANIDINE HCL 4 MG PO TABS
6.0000 mg | ORAL_TABLET | Freq: Four times a day (QID) | ORAL | Status: DC
  Administered 2013-11-26: 22:00:00 6 mg via ORAL
  Filled 2013-11-26 (×8): qty 1

## 2013-11-26 MED ORDER — SODIUM CHLORIDE 0.9 % IV BOLUS (SEPSIS)
1000.0000 mL | Freq: Once | INTRAVENOUS | Status: AC
  Administered 2013-11-26: 1000 mL via INTRAVENOUS

## 2013-11-26 MED ORDER — ALBUTEROL SULFATE (5 MG/ML) 0.5% IN NEBU
2.5000 mg | INHALATION_SOLUTION | RESPIRATORY_TRACT | Status: DC | PRN
  Administered 2013-11-27: 2.5 mg via RESPIRATORY_TRACT
  Filled 2013-11-26: qty 0.5

## 2013-11-26 MED ORDER — DEXTROSE 5 % IV SOLN
1.0000 g | INTRAVENOUS | Status: DC
  Administered 2013-11-26 – 2013-11-27 (×2): 1 g via INTRAVENOUS
  Filled 2013-11-26 (×2): qty 10

## 2013-11-26 MED ORDER — POTASSIUM CHLORIDE IN NACL 40-0.9 MEQ/L-% IV SOLN
INTRAVENOUS | Status: AC
  Administered 2013-11-26 – 2013-11-27 (×2): 125 mL/h via INTRAVENOUS
  Administered 2013-11-27: 16:00:00 via INTRAVENOUS
  Filled 2013-11-26 (×3): qty 1000

## 2013-11-26 MED ORDER — POTASSIUM CHLORIDE CRYS ER 20 MEQ PO TBCR
40.0000 meq | EXTENDED_RELEASE_TABLET | Freq: Once | ORAL | Status: AC
  Administered 2013-11-26: 40 meq via ORAL
  Filled 2013-11-26: qty 2

## 2013-11-26 MED ORDER — ACETAMINOPHEN 325 MG PO TABS
650.0000 mg | ORAL_TABLET | ORAL | Status: DC | PRN
  Administered 2013-11-26 – 2013-12-01 (×13): 650 mg via ORAL
  Filled 2013-11-26 (×13): qty 2

## 2013-11-26 MED ORDER — POLYETHYLENE GLYCOL 3350 17 G PO PACK: 17.0000 g | PACK | Freq: Every day | ORAL | Status: DC | PRN

## 2013-11-26 MED ORDER — SODIUM CHLORIDE 0.9 % IV BOLUS (SEPSIS): 1000.0000 mL | Freq: Once | INTRAVENOUS | Status: DC

## 2013-11-26 MED ORDER — SODIUM CHLORIDE 0.9 % IV SOLN: INTRAVENOUS | Status: DC

## 2013-11-26 MED ORDER — DEXTROSE 5 % IV SOLN
500.0000 mg | Freq: Once | INTRAVENOUS | Status: AC
  Administered 2013-11-26: 500 mg via INTRAVENOUS

## 2013-11-26 NOTE — H&P (Signed)
[facility]                                                                                    Patient Demographics  Debbie [name], is a 37 y.o. female  MRN: [id number]   DOB - 1976-02-01  Admit Date - 11/26/2013  Outpatient Primary MD for the patient is [name], MD   With History of -  Past Medical History  Diagnosis Date  . Hypertension   . Spastic paraplegia   . Neurogenic bladder     and bowel  . Mass     T5-T6 intradural mass  . Chronic pain   . Chronic myofascial pain       Past Surgical History  Procedure Laterality Date  . Fracture surgery    . Cesarean section    . 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 Spine surgery      in for   Chief Complaint  Patient presents with  . Fever  . Cough     HPI  Debbie [name]  is a 37 y.o. 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, neurogenic bladder and bowel, self caths herself 3-4 times a day, hypertension, spastic paraplegia, who did not take a flu shot as she thinks she is allergic to it, comes in with 4-5 day history of sore throat and low-grade fevers, she also has a dry cough, she initially went to her PCP and was diagnosed with viral illness and sent home, her fevers continued to get worse and she presented to the ER today where she was febrile, had leukocytosis, chest x-ray was suggestive of interstitial return pneumonia, throat swab was positive for strep pneumonia, I was called to admit the patient for community for pneumonia-strep throat-SIRS.   Patient currently is feeling better, after IV fluid boluses her blood pressure is stable, she does not appear to be toxic. Besides sore throat, dry cough and fevers her review of systems is negative. She says that at  baseline she has foul-smelling urine and that has not changed. No skin rashes or bruises. No diarrhea.    Review of Systems    In addition to the HPI above,   ++ Fever-chills, No Headache, No changes with Vision or hearing, No problems swallowing food or Liquids, No Chest pain, ++ Cough, No Shortness of Breath, No Abdominal pain, No Nausea or Vommitting, Bowel movements are regular, No Blood in stool or Urine, No dysuria, No new skin rashes or bruises, No new joints pains-aches,  No new weakness, tingling, numbness in any extremity, No recent weight gain or loss, No polyuria, polydypsia or polyphagia, No significant Mental Stressors.  A full 10 point Review of Systems was done, except as stated above, all other Review of Systems were negative.   Social History History  Substance Use Topics  . Smoking status: Former Smoker -- 1.50 packs/day for 10 years    Types: Cigarettes    Quit  date: 11/07/2012  . Smokeless tobacco: Not on file  . Alcohol Use: Yes     Comment: occ      Family History Family History  Problem Relation Age of Onset  . Multiple sclerosis Father   . Diabetes Mother       Prior to Admission medications   Medication Sig Start Date End Date Taking? Authorizing Provider  diazepam (VALIUM) 5 MG tablet Take 1 tablet (5 mg total) by mouth every 8 (eight) hours as needed (spasm). 10/21/13  Yes Ranelle Oyster, MD  gabapentin (NEURONTIN) 300 MG capsule Take 2 capsules (600 mg total) by mouth 3 (three) times daily. 11/24/13  Yes Ranelle Oyster, MD  hydrochlorothiazide (MICROZIDE) 12.5 MG capsule Take 1 capsule (12.5 mg total) by mouth daily. 07/02/13  Yes Ranelle Oyster, MD  ibuprofen (ADVIL) 200 MG tablet Take 200 mg by mouth every 6 (six) hours as needed.   Yes Historical Provider, MD  lisinopril (PRINIVIL,ZESTRIL) 40 MG tablet Take 1 tablet (40 mg total) by mouth daily. 12/06/12  Yes Daniel J Angiulli, PA-C  meloxicam (MOBIC) 15 MG tablet Take 1 tablet (15  mg total) by mouth daily. 05/26/13  Yes Ranelle Oyster, MD  oxyCODONE (OXY IR/ROXICODONE) 5 MG immediate release tablet Take 1 tablet (5 mg total) by mouth every 6 (six) hours as needed. 10/10/13  Yes Ranelle Oyster, MD  pantoprazole (PROTONIX) 40 MG tablet Take 1 tablet (40 mg total) by mouth daily. 09/19/13  Yes Ranelle Oyster, MD  Phenylephrine-APAP-Guaifenesin [facility] SINUS-MAX CONGESTION) 5-325-200 MG TABS Take 2 tablets by mouth daily as needed (for cold and sinus).   Yes Historical Provider, MD  tiZANidine (ZANAFLEX) 4 MG tablet Take 1.5 tablets (6 mg total) by mouth 4 (four) times daily. 10/10/13  Yes Ranelle Oyster, MD    Allergies  Allergen Reactions  . Honey Bee Treatment [Bee Venom] Anaphylaxis and Itching    Physical Exam  Vitals  Blood pressure 111/61, pulse 76, temperature 99.6 F (37.6 C), temperature source Oral, resp. rate 20, height 5' 8.5" (1.74 m), weight 98.431 kg (217 lb), last menstrual period 11/12/2013, SpO2 95.00%.   1. General middle-aged obese African American female lying in bed in NAD,     2. Normal affect and insight, Not Suicidal or Homicidal, Awake Alert, Oriented X 3.  3. No F.N deficits, ALL C.Nerves Intact, strength 5 / 5 in upper extremities and 3-4 / 5 in upper extremities, right more than left., Sensation intact all 4 extremities, Plantars down going.  4. Ears and Eyes appear Normal, Conjunctivae clear, PERRLA. Moist Oral Mucosa.  5. Supple Neck, No JVD, palpable and tender cervical lymphadenopathy appriciated, No Carotid Bruits.  6. Symmetrical Chest wall movement, Good air movement bilaterally, or smile and breath sounds  7. RRR, No Gallops, Rubs or Murmurs, No Parasternal Heave.  8. Positive Bowel Sounds, Abdomen Soft, Non tender, No organomegaly appriciated,No rebound -guarding or rigidity.  9.  No Cyanosis, Normal Skin Turgor, No Skin Rash or Bruise.  10. Good muscle tone,  joints appear normal , no effusions, Normal ROM.  11.  No Palpable Lymph Nodes in Neck or Axillae    Data Review  CBC  Recent Labs Lab 11/26/13 1530  WBC 17.4*  HGB 11.8*  HCT 35.4*  PLT 205  MCV 87.4  MCH 29.1  MCHC 33.3  RDW 13.9  LYMPHSABS 0.9  MONOABS 0.9  EOSABS 0.0  BASOSABS 0.0   ------------------------------------------------------------------------------------------------------------------  Chemistries   Recent  Labs Lab 11/26/13 1530  NA 137  K 2.9*  CL 99  CO2 21  GLUCOSE 122*  BUN 19  CREATININE 1.28*  CALCIUM 9.0  AST 8  ALT 14  ALKPHOS 84  BILITOT 0.6   ------------------------------------------------------------------------------------------------------------------ estimated creatinine clearance is 73.8 ml/min (by C-G formula based on Cr of 1.28). ------------------------------------------------------------------------------------------------------------------ No results found for this basename: TSH, T4TOTAL, FREET3, T3FREE, THYROIDAB,  in the last 72 hours   Coagulation profile No results found for this basename: INR, PROTIME,  in the last 168 hours ------------------------------------------------------------------------------------------------------------------- No results found for this basename: DDIMER,  in the last 72 hours -------------------------------------------------------------------------------------------------------------------  Cardiac Enzymes No results found for this basename: CK, CKMB, TROPONINI, MYOGLOBIN,  in the last 168 hours ------------------------------------------------------------------------------------------------------------------ No components found with this basename: POCBNP,    ---------------------------------------------------------------------------------------------------------------  Urinalysis    Component Value Date/Time   COLORURINE AMBER* 11/26/2013 1630   APPEARANCEUR HAZY* 11/26/2013 1630   LABSPEC >1.030* 11/26/2013 1630   PHURINE 5.5  11/26/2013 1630   GLUCOSEU NEGATIVE 11/26/2013 1630   HGBUR NEGATIVE 11/26/2013 1630   BILIRUBINUR NEGATIVE 11/26/2013 1630   KETONESUR NEGATIVE 11/26/2013 1630   PROTEINUR TRACE* 11/26/2013 1630   UROBILINOGEN 1.0 11/26/2013 1630   NITRITE NEGATIVE 11/26/2013 1630   LEUKOCYTESUR NEGATIVE 11/26/2013 1630    ----------------------------------------------------------------------------------------------------------------  Imaging results:   Dg Chest Port 1 View  11/26/2013   CLINICAL DATA:  Fever and cough.  EXAM: PORTABLE CHEST - 1 VIEW  COMPARISON:  None.  FINDINGS: The lungs are adequately inflated. The interstitial markings are diffusely increased. The cardiopericardial silhouette is top-normal in size. The pulmonary vascularity is not clinically is not clearly engorged. There is no pleural effusion or pneumothorax. The mediastinum is normal in width. The observed portions of the bony thorax exhibit no acute abnormalities.  IMPRESSION: Bilaterally increased interstitial markings suggest interstitial pneumonia. There is no alveolar infiltrate nor definite pulmonary vascular congestion. When the patient can tolerate the procedure, a PA and lateral chest x-ray would be of value.   Electronically Signed   By: David  [place]   On: 11/26/2013 16:07     Assessment & Plan   1.SIRs due to comminution of community-acquired pneumonia along with strep throat. She will be admitted to a telemetry bed, IV fluid bolus followed by maintenance IV fluids, blood cultures will be obtained, she will be placed on Rocephin along with azithromycin which will suffice for both strep throat and committed for pneumonia, oxygen nebulizer treatments, monitor clinically.   2. History of hypertension. Currently blood pressure is low due to #1 above, hold blood pressure medications. IV fluid for now.    3. Hypokalemia. Replace and monitor    4. History of spastic paraplegia, neurogenic bowel and bladder - continue  muscle relaxants and supportive care, for now we'll place Foley as she is sick and has neurogenic bladder requiring frequent self catheterizations.    5. GERD. Continue PPI.    6. Dirty-appearing UA. This is UTI. 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   DVT Prophylaxis Heparin   AM Labs Ordered, also please review Full Orders  Family Communication: Admission, patients condition and plan of care including tests being ordered have been discussed with the patient and who indicate understanding and agree with the plan and Code Status.  Code Status Full  Likely DC to Home  Condition GUARDED   Time spent in minutes : 35    Susa Raring K M.D on 11/26/2013 at 6:02 PM  Between  7am to 7pm - Pager - [phone]  After 7pm go to [email or web address] - password [email or web address]  And look for the night coverage person covering me after hours  [facility] Office  [phone]

## 2013-11-26 NOTE — ED Notes (Signed)
Pt c/o onset of cough, sore throat, fever and change in mental state. Fever started Monday as low grade. Pt reports temp of 103 last night. Husband states pt had altered mental state last night and low BP today.

## 2013-11-26 NOTE — ED Provider Notes (Signed)
CSN: [id number]     Arrival date & time 11/26/13  1516 History   First MD Initiated Contact with Patient 11/26/13 1528     Chief Complaint  Patient presents with  . Fever  . Cough    HPI Pt was seen at 1540.  Per pt and her family, c/o gradual onset and worsening of persistent cough, sore throat, chills, and generalized fatigue/weakness for the past 3 days. Pt states she has had home fevers to "103" since last night. Pt endorses she has a chronic "weak cough" due to hx of thoracic spine "mass" s/p surgery 1 year ago, and she is concerned she "might have pneumonia." Denies abd pain, no N/V/D, no neck pain, no CP/SOB, no rash.     Past Medical History  Diagnosis Date  . Hypertension   . Spastic paraplegia   . Neurogenic bladder     and bowel  . Mass     T5-T6 intradural mass  . Chronic pain   . Chronic myofascial pain    Past Surgical History  Procedure Laterality Date  . Fracture surgery    . Cesarean section    . 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 Spine surgery     Family History  Problem Relation Age of Onset  . Multiple sclerosis Father   . Diabetes Mother    History  Substance Use Topics  . Smoking status: Former Smoker -- 1.50 packs/day for 10 years    Types: Cigarettes    Quit date: 11/07/2012  . Smokeless tobacco: Not on file  . Alcohol Use: Yes     Comment: occ   OB History   Grav Para Term Preterm Abortions TAB SAB Ect Mult Living   3 2  2 1   1        Review of Systems ROS: Statement: All systems negative except as marked or noted in the HPI; Constitutional: +fever and chills, generalized fatigue/weakness. ; ; Eyes: Negative for eye pain, redness and discharge. ; ; ENMT: Negative for ear pain, hoarseness, +nasal congestion, sinus pressure and sore throat. ; ;  Cardiovascular: Negative for chest pain, palpitations, diaphoresis, dyspnea and peripheral edema. ; ; Respiratory: +cough. Negative for wheezing and stridor. ; ; Gastrointestinal: Negative for nausea, vomiting, diarrhea, abdominal pain, blood in stool, hematemesis, jaundice and rectal bleeding. . ; ; Genitourinary: Negative for dysuria, flank pain and hematuria. ; ; Musculoskeletal: Negative for back pain and neck pain. Negative for swelling and trauma.; ; Skin: Negative for pruritus, rash, abrasions, blisters, bruising and skin lesion.; ; Neuro: Negative for headache, lightheadedness and neck stiffness. Negative for altered level of consciousness , altered mental status, extremity weakness, paresthesias, involuntary movement, seizure and syncope.      Allergies  Honey bee treatment  Home Medications   Current Outpatient Rx  Name  Route  Sig  Dispense  Refill  . diazepam (VALIUM) 5 MG tablet   Oral   Take 1 tablet (5 mg total) by mouth every 8 (eight) hours as needed (spasm).   90 tablet   1   . gabapentin (NEURONTIN) 300 MG capsule   Oral   Take 2 capsules (600 mg total) by mouth 3 (three) times daily.   180 capsule   4   . hydrochlorothiazide (MICROZIDE) 12.5 MG capsule   Oral  Take 1 capsule (12.5 mg total) by mouth daily.   30 capsule   5   . ibuprofen (ADVIL) 200 MG tablet   Oral   Take 200 mg by mouth every 6 (six) hours as needed.         Marland Kitchen lisinopril (PRINIVIL,ZESTRIL) 40 MG tablet   Oral   Take 1 tablet (40 mg total) by mouth daily.   30 tablet   1   . meloxicam (MOBIC) 15 MG tablet   Oral   Take 1 tablet (15 mg total) by mouth daily.   30 tablet   4   . oxyCODONE (OXY IR/ROXICODONE) 5 MG immediate release tablet   Oral   Take 1 tablet (5 mg total) by mouth every 6 (six) hours as needed.   90 tablet   0   . pantoprazole (PROTONIX) 40 MG tablet   Oral   Take 1 tablet (40 mg total) by mouth daily.   30 tablet   5   . Phenylephrine-APAP-Guaifenesin  (MUCINEX SINUS-MAX CONGESTION) 5-325-200 MG TABS   Oral   Take 2 tablets by mouth daily as needed (for cold and sinus).         Marland Kitchen tiZANidine (ZANAFLEX) 4 MG tablet   Oral   Take 1.5 tablets (6 mg total) by mouth 4 (four) times daily.   180 tablet   5    BP 111/61  Pulse 76  Temp(Src) 99.6 F (37.6 C) (Oral)  Resp 20  Ht 5' 8.5" (1.74 m)  Wt 217 lb (98.431 kg)  BMI 32.51 kg/m2  SpO2 95%  LMP 11/12/2013 Filed Vitals:   11/26/13 1519 11/26/13 1525 11/26/13 1728  BP: 87/47  111/61  Pulse: 79  76  Temp: 98.3 F (36.8 C)  99.6 F (37.6 C)  TempSrc: Oral  Oral  Resp:  20 20  Height: 5' 8.5" (1.74 m)    Weight: 217 lb (98.431 kg)    SpO2: 90%  95%    Physical Exam 1545: Physical examination:  Nursing notes reviewed; Vital signs and O2 SAT reviewed;  Constitutional: Well developed, Well nourished, In no acute distress; Head:  Normocephalic, atraumatic; Eyes: EOMI, PERRL, No scleral icterus; ENMT:  Mucous membranes dry. +edemetous nasal turbinates bilat with clear rhinorrhea. +mild posterior pharyngeal erythema. Mouth and pharynx without lesions. No tonsillar exudates. No intra-oral edema. No submandibular or sublingual edema. No hoarse voice, no drooling, no stridor. No pain with manipulation of larynx.; Neck: Supple, Full range of motion, No meningeal signs. No lymphadenopathy; Cardiovascular: Regular rate and rhythm, No gallop; Respiratory: Breath sounds coarse & equal bilaterally, No wheezes.  Speaking full sentences with ease, Normal respiratory effort/excursion; Chest: Nontender, Movement normal; Abdomen: Soft, Nontender, Nondistended, Normal bowel sounds; Genitourinary: No CVA tenderness; Extremities: Pulses normal, No tenderness, No edema, No calf edema or asymmetry.; Neuro: AA&Ox3, Major CN grossly intact.  Speech clear. Moves all extremities on stretcher..; Skin: Color normal, Warm, Dry.   ED Course  Procedures   EKG Interpretation   None       MDM  MDM Reviewed:  previous chart, nursing note and vitals Reviewed previous: labs Interpretation: labs and x-ray Total time providing critical care: 30-74 minutes. This excludes time spent performing separately reportable procedures and services. Consults: admitting MD     CRITICAL CARE Performed by: Laray Anger Total critical care time: 40 Critical care time was exclusive of separately billable procedures and treating other patients. Critical care was necessary to treat or prevent imminent or life-threatening deterioration.  Critical care was time spent personally by me on the following activities: development of treatment plan with patient and/or surrogate as well as nursing, discussions with consultants, evaluation of patient's response to treatment, examination of patient, obtaining history from patient or surrogate, ordering and performing treatments and interventions, ordering and review of laboratory studies, ordering and review of radiographic studies, pulse oximetry and re-evaluation of patient's condition.   Results for orders placed during the hospital encounter of 11/26/13  RAPID STREP SCREEN      Result Value Range   Streptococcus, Group A Screen (Direct) POSITIVE (*) NEGATIVE  ETHANOL      Result Value Range   Alcohol, Ethyl (B) <11  0 - 11 mg/dL  URINE RAPID DRUG SCREEN (HOSP PERFORMED)      Result Value Range   Opiates NONE DETECTED  NONE DETECTED   Cocaine NONE DETECTED  NONE DETECTED   Benzodiazepines POSITIVE (*) NONE DETECTED   Amphetamines NONE DETECTED  NONE DETECTED   Tetrahydrocannabinol NONE DETECTED  NONE DETECTED   Barbiturates NONE DETECTED  NONE DETECTED  URINALYSIS W MICROSCOPIC + REFLEX CULTURE      Result Value Range   Color, Urine AMBER (*) YELLOW   APPearance HAZY (*) CLEAR   Specific Gravity, Urine >1.030 (*) 1.005 - 1.030   pH 5.5  5.0 - 8.0   Glucose, UA NEGATIVE  NEGATIVE mg/dL   Hgb urine dipstick NEGATIVE  NEGATIVE   Bilirubin Urine NEGATIVE   NEGATIVE   Ketones, ur NEGATIVE  NEGATIVE mg/dL   Protein, ur TRACE (*) NEGATIVE mg/dL   Urobilinogen, UA 1.0  0.0 - 1.0 mg/dL   Nitrite NEGATIVE  NEGATIVE   Leukocytes, UA NEGATIVE  NEGATIVE   WBC, UA 3-6  <3 WBC/hpf   RBC / HPF 0-2  <3 RBC/hpf   Bacteria, UA MANY (*) RARE   Squamous Epithelial / LPF MANY (*) RARE  PREGNANCY, URINE      Result Value Range   Preg Test, Ur NEGATIVE  NEGATIVE  CBC WITH DIFFERENTIAL      Result Value Range   WBC 17.4 (*) 4.0 - 10.5 K/uL   RBC 4.05  3.87 - 5.11 MIL/uL   Hemoglobin 11.8 (*) 12.0 - 15.0 g/dL   HCT 16.1 (*) 09.6 - 04.5 %   MCV 87.4  78.0 - 100.0 fL   MCH 29.1  26.0 - 34.0 pg   MCHC 33.3  30.0 - 36.0 g/dL   RDW 40.9  81.1 - 91.4 %   Platelets 205  150 - 400 K/uL   Neutrophils Relative % 90 (*) 43 - 77 %   Neutro Abs 15.6 (*) 1.7 - 7.7 K/uL   Lymphocytes Relative 5 (*) 12 - 46 %   Lymphs Abs 0.9  0.7 - 4.0 K/uL   Monocytes Relative 5  3 - 12 %   Monocytes Absolute 0.9  0.1 - 1.0 K/uL   Eosinophils Relative 0  0 - 5 %   Eosinophils Absolute 0.0  0.0 - 0.7 K/uL   Basophils Relative 0  0 - 1 %   Basophils Absolute 0.0  0.0 - 0.1 K/uL  COMPREHENSIVE METABOLIC PANEL      Result Value Range   Sodium 137  135 - 145 mEq/L   Potassium 2.9 (*) 3.5 - 5.1 mEq/L   Chloride 99  96 - 112 mEq/L   CO2 21  19 - 32 mEq/L   Glucose, Bld 122 (*) 70 - 99 mg/dL   BUN  19  6 - 23 mg/dL   Creatinine, Ser 1.19 (*) 0.50 - 1.10 mg/dL   Calcium 9.0  8.4 - 14.7 mg/dL   Total Protein 7.2  6.0 - 8.3 g/dL   Albumin 3.4 (*) 3.5 - 5.2 g/dL   AST 8  0 - 37 U/L   ALT 14  0 - 35 U/L   Alkaline Phosphatase 84  39 - 117 U/L   Total Bilirubin 0.6  0.3 - 1.2 mg/dL   GFR calc non Af Amer 52 (*) >90 mL/min   GFR calc Af Amer 61 (*) >90 mL/min  LACTIC ACID, PLASMA      Result Value Range   Lactic Acid, Venous 1.7  0.5 - 2.2 mmol/L  INFLUENZA PANEL BY PCR      Result Value Range   Influenza A By PCR NEGATIVE  NEGATIVE   Influenza B By PCR NEGATIVE  NEGATIVE    H1N1 flu by pcr NOT DETECTED  NOT DETECTED   Dg Chest Port 1 View 11/26/2013   CLINICAL DATA:  Fever and cough.  EXAM: PORTABLE CHEST - 1 VIEW  COMPARISON:  None.  FINDINGS: The lungs are adequately inflated. The interstitial markings are diffusely increased. The cardiopericardial silhouette is top-normal in size. The pulmonary vascularity is not clinically is not clearly engorged. There is no pleural effusion or pneumothorax. The mediastinum is normal in width. The observed portions of the bony thorax exhibit no acute abnormalities.  IMPRESSION: Bilaterally increased interstitial markings suggest interstitial pneumonia. There is no alveolar infiltrate nor definite pulmonary vascular congestion. When the patient can tolerate the procedure, a PA and lateral chest x-ray would be of value.   Electronically Signed   By: David  [place]   On: 11/26/2013 16:07    1545:  Pt's SBP 87 on arrival.  Pt mentating per baseline per family at bedside, resps without distress. IVF NS bolus ordered while workup pending.  1730:  SBP increased to 111 after IVF bolus, but drifting down to 100. Will order 2nd IVF NS bolus. +UTI, UC pending. +strep throat. +CAP on CXR. BC x2 pending. Will start IV rocephin and zithromax for best overall coverage. Dx and testing d/w pt and family.  Questions answered.  Verb understanding, agreeable to admit.  1745:   T/C to [facility] Dr. Thedore Mins, case discussed, including:  HPI, pertinent PM/SHx, VS/PE, dx testing, ED course and treatment:  Agreeable to admit, requests he will come to ED for eval.     Laray Anger, DO 11/28/13 1533

## 2013-11-27 ENCOUNTER — Encounter (HOSPITAL_COMMUNITY): Payer: Self-pay

## 2013-11-27 DIAGNOSIS — J189 Pneumonia, unspecified organism: Secondary | ICD-10-CM

## 2013-11-27 DIAGNOSIS — K219 Gastro-esophageal reflux disease without esophagitis: Secondary | ICD-10-CM

## 2013-11-27 DIAGNOSIS — I959 Hypotension, unspecified: Secondary | ICD-10-CM

## 2013-11-27 LAB — URINE CULTURE: Culture: NO GROWTH

## 2013-11-27 LAB — CBC
HCT: 31.8 % — ABNORMAL LOW (ref 36.0–46.0)
MCHC: 33.6 g/dL (ref 30.0–36.0)
MCV: 87.1 fL (ref 78.0–100.0)
RDW: 14 % (ref 11.5–15.5)

## 2013-11-27 LAB — BASIC METABOLIC PANEL
BUN: 13 mg/dL (ref 6–23)
CO2: 22 mEq/L (ref 19–32)
Calcium: 8.5 mg/dL (ref 8.4–10.5)
Chloride: 104 mEq/L (ref 96–112)
Creatinine, Ser: 0.9 mg/dL (ref 0.50–1.10)
GFR calc Af Amer: >90 mL/min (ref >90)

## 2013-11-27 MED ORDER — TIZANIDINE HCL 4 MG PO TABS
6.0000 mg | ORAL_TABLET | Freq: Four times a day (QID) | ORAL | Status: DC
  Administered 2013-11-27 – 2013-12-01 (×17): 6 mg via ORAL
  Filled 2013-11-27 (×26): qty 2

## 2013-11-27 MED ORDER — TIZANIDINE HCL 4 MG PO TABS
4.0000 mg | ORAL_TABLET | Freq: Four times a day (QID) | ORAL | Status: DC
  Filled 2013-11-27 (×6): qty 1

## 2013-11-27 MED ORDER — DEXTROSE 5 % IV SOLN
INTRAVENOUS | Status: AC
  Filled 2013-11-27: qty 10

## 2013-11-27 MED ORDER — IPRATROPIUM BROMIDE 0.02 % IN SOLN
0.5000 mg | Freq: Four times a day (QID) | RESPIRATORY_TRACT | Status: DC
  Administered 2013-11-27 – 2013-11-28 (×6): 0.5 mg via RESPIRATORY_TRACT
  Filled 2013-11-27 (×5): qty 2.5

## 2013-11-27 MED ORDER — MENTHOL 3 MG MT LOZG: 1.0000 | LOZENGE | OROMUCOSAL | Status: DC | PRN

## 2013-11-27 MED ORDER — ALBUTEROL SULFATE (5 MG/ML) 0.5% IN NEBU
2.5000 mg | INHALATION_SOLUTION | Freq: Four times a day (QID) | RESPIRATORY_TRACT | Status: DC
  Administered 2013-11-27 – 2013-11-28 (×6): 2.5 mg via RESPIRATORY_TRACT
  Filled 2013-11-27 (×5): qty 0.5

## 2013-11-27 NOTE — Progress Notes (Signed)
[facility] PROGRESS NOTE  Debbie [name] ZOX:[id number] DOB: 05-14-1976 DOA: 11/26/2013 PCP: [name], MD  Brief narrative: 37 y/o female with paraplegia, neurogenic bladder who self caths, HTN, spastic paraplegia admitted for sore thoat and fevers  Assessment/Plan: Principal Problem:   CAP (community acquired pneumonia)/ Strep throat  - Flu negative - cont current antibiotics - add lozenges  Active Problems:   Hypotension with h/o HTN (hypertension) - cont IVF - hold HCTZ and Lisinopril    Spastic paraplegia - Zanaflex ordered  Bladder Incontinence - able to void on her own with mild stress incontinence - cont foley for today  UTI - follow cultures    GERD (gastroesophageal reflux disease) - cont Protonix    Code Status: Full code   Family Communication: none  Disposition Plan: likely home  Consultants:  none  Procedures:  none  Antibiotics:  Rocephin and Zithromax 12/24  DVT Prophylaxis:  Heparin    HPI/Subjective: Sever sore throat and cough- was trying to hold out until after christmas. Poor appetite. No other complaints. Tells me she is able to urinate but often begins to urinate before making it to commode. No trouble with bowel incontinence.   Objective: Filed Vitals:   11/27/13 0617 11/27/13 0756 11/27/13 0759 11/27/13 0900  BP: 108/58     Pulse: 93     Temp: 100.4 F (38 C) 100.9 F (38.3 C)  101.9 F (38.8 C)  TempSrc: Oral Oral  Oral  Resp: 985 on 2 L      Height:      Weight:      SpO2:   98%     Intake/Output Summary (Last 24 hours) at 11/27/13 1057 Last data filed at 11/27/13 0432  Gross per 24 hour  Intake    480 ml  Output   1608 ml  Net  -1128 ml    Exam:   General:  AAO x   Cardiovascular: RRR, no murmurs  Respiratory: Crackles R> L no wheezing or ronchi- (98% on 2 L O2)   Abdomen: Soft, NT, ND, BS+  Ext: no c/c/e  Data Reviewed: Basic Metabolic Panel:  Recent Labs Lab 11/26/13 1530  11/27/13 0540  NA 137 138  K 2.9* 3.3*  CL 99 104  CO2 21 22  GLUCOSE 122* 126*  BUN 19 13  CREATININE 1.28* 0.90  CALCIUM 9.0 8.5   Liver Function Tests:  Recent Labs Lab 11/26/13 1530  AST 8  ALT 14  ALKPHOS 84  BILITOT 0.6  PROT 7.2  ALBUMIN 3.4*   No results found for this basename: LIPASE, AMYLASE,  in the last 168 hours No results found for this basename: AMMONIA,  in the last 168 hours CBC:  Recent Labs Lab 11/26/13 1530 11/27/13 0540  WBC 17.4* 14.3*  NEUTROABS 15.6*  --   HGB 11.8* 10.7*  HCT 35.4* 31.8*  MCV 87.4 87.1  PLT 205 180   Cardiac Enzymes: No results found for this basename: CKTOTAL, CKMB, CKMBINDEX, TROPONINI,  in the last 168 hours BNP (last 3 results) No results found for this basename: PROBNP,  in the last 8760 hours CBG: No results found for this basename: GLUCAP,  in the last 168 hours  Recent Results (from the past 240 hour(s))  CULTURE, BLOOD (ROUTINE X 2)     Status: None   Collection Time    11/26/13  3:42 PM      Result Value Range Status   Specimen Description BLOOD LEFT ANTECUBITAL  Final   Special Requests     Final   Value: BOTTLES DRAWN AEROBIC AND ANAEROBIC AEB=10CC ANA=8CC   Culture NO GROWTH 1 DAY   Final   Report Status PENDING   Incomplete  RAPID STREP SCREEN     Status: Abnormal   Collection Time    11/26/13  3:50 PM      Result Value Range Status   Streptococcus, Group A Screen (Direct) POSITIVE (*) NEGATIVE Final  CULTURE, BLOOD (ROUTINE X 2)     Status: None   Collection Time    11/26/13  5:59 PM      Result Value Range Status   Specimen Description BLOOD LEFT ANTECUBITAL   Final   Special Requests     Final   Value: BOTTLES DRAWN AEROBIC AND ANAEROBIC AEB=12CC ANA=8CC   Culture NO GROWTH 1 DAY   Final   Report Status PENDING   Incomplete     Studies: Dg Chest Port 1 View  11/26/2013   CLINICAL DATA:  Fever and cough.  EXAM: PORTABLE CHEST - 1 VIEW  COMPARISON:  None.  FINDINGS: The lungs are  adequately inflated. The interstitial markings are diffusely increased. The cardiopericardial silhouette is top-normal in size. The pulmonary vascularity is not clinically is not clearly engorged. There is no pleural effusion or pneumothorax. The mediastinum is normal in width. The observed portions of the bony thorax exhibit no acute abnormalities.  IMPRESSION: Bilaterally increased interstitial markings suggest interstitial pneumonia. There is no alveolar infiltrate nor definite pulmonary vascular congestion. When the patient can tolerate the procedure, a PA and lateral chest x-ray would be of value.   Electronically Signed   By: David  [place]   On: 11/26/2013 16:07    Scheduled Meds: . albuterol  2.5 mg Nebulization QID  . azithromycin  500 mg Intravenous Q24H  . cefTRIAXone (ROCEPHIN)  IV  1 g Intravenous Q24H  . gabapentin  600 mg Oral TID  . heparin  5,000 Units Subcutaneous Q8H  . ipratropium  0.5 mg Nebulization QID  . pantoprazole  40 mg Oral Daily  . sodium chloride  1,000 mL Intravenous Once  . sodium chloride  3 mL Intravenous Q12H  . tiZANidine  6 mg Oral QID   Continuous Infusions: . 0.9 % NaCl with KCl 40 mEq / L 125 mL/hr (11/27/13 1610)    ________________________________________________________________________  Time spent in minutes: 35 min    [facility]  [facility] Pager [phone] If 8PM-8AM, please contact night-coverage at [email or web address], password [email or web address] 11/27/2013, 10:57 AM  LOS: 1 day

## 2013-11-27 NOTE — Progress Notes (Signed)
Patient c/o blurred vision and "hallucinations". B/P slightly elevated. She has a temp of 100.3. Contacted MD on call. Received order to do neuro checks throughout the night. Will continue to monitor closely.

## 2013-11-27 NOTE — Progress Notes (Signed)
ANTIBIOTIC CONSULT NOTE - INITIAL  Pharmacy Consult for Renal Adjustment Antibiotics Indication: pneumonia, strep throat  Allergies  Allergen Reactions  . Honey Bee Treatment [Bee Venom] Anaphylaxis and Itching    Patient Measurements: Height: 5' 8.5" (174 cm) Weight: 222 lb 0.1 oz (100.7 kg) IBW/kg (Calculated) : 65.05 Adjusted Body Weight:  Vital Signs: Temp: 101.9 F (38.8 C) (12/25 0900) Temp src: Oral (12/25 0900) BP: 108/58 mmHg (12/25 0429) Pulse Rate: 93 (12/25 0429) Intake/Output from previous day: 12/24 0701 - 12/25 0700 In: 480 [P.O.:480] Out: 1608 [Urine:1608] Intake/Output from this shift:    Labs:  Recent Labs  11/26/13 1530 11/27/13 0540  WBC 17.4* 14.3*  HGB 11.8* 10.7*  PLT 205 180  CREATININE 1.28* 0.90   Estimated Creatinine Clearance: 106.1 ml/min (by C-G formula based on Cr of 0.9). No results found for this basename: VANCOTROUGH, Leodis Binet, VANCORANDOM, GENTTROUGH, GENTPEAK, GENTRANDOM, TOBRATROUGH, TOBRAPEAK, TOBRARND, AMIKACINPEAK, AMIKACINTROU, AMIKACIN,  in the last 72 hours   Microbiology: Recent Results (from the past 720 hour(s))  CULTURE, BLOOD (ROUTINE X 2)     Status: None   Collection Time    11/26/13  3:42 PM      Result Value Range Status   Specimen Description BLOOD LEFT ANTECUBITAL   Final   Special Requests     Final   Value: BOTTLES DRAWN AEROBIC AND ANAEROBIC AEB=10CC ANA=8CC   Culture NO GROWTH 1 DAY   Final   Report Status PENDING   Incomplete  RAPID STREP SCREEN     Status: Abnormal   Collection Time    11/26/13  3:50 PM      Result Value Range Status   Streptococcus, Group A Screen (Direct) POSITIVE (*) NEGATIVE Final  CULTURE, BLOOD (ROUTINE X 2)     Status: None   Collection Time    11/26/13  5:59 PM      Result Value Range Status   Specimen Description BLOOD LEFT ANTECUBITAL   Final   Special Requests     Final   Value: BOTTLES DRAWN AEROBIC AND ANAEROBIC AEB=12CC ANA=8CC   Culture NO GROWTH 1 DAY   Final    Report Status PENDING   Incomplete    Medical History: Past Medical History  Diagnosis Date  . Hypertension   . Spastic paraplegia   . Neurogenic bladder     and bowel  . Mass     T5-T6 intradural mass  . Chronic pain   . Chronic myofascial pain     Medications:  Scheduled:  . albuterol  2.5 mg Nebulization QID  . azithromycin  500 mg Intravenous Q24H  . cefTRIAXone (ROCEPHIN)  IV  1 g Intravenous Q24H  . gabapentin  600 mg Oral TID  . heparin  5,000 Units Subcutaneous Q8H  . ipratropium  0.5 mg Nebulization QID  . pantoprazole  40 mg Oral Daily  . sodium chloride  1,000 mL Intravenous Once  . sodium chloride  3 mL Intravenous Q12H  . tiZANidine  6 mg Oral QID   Assessment: Community acquired pneumonia, strep throat Ceftriaxone 1 GM IV every 24 hours started Azithromycin 500 mg IV every 24 hours started CrCl 106.1 ml/min  Goal of Therapy:  Eradicate infection  Plan:  Continue Ceftriaxone 1 GM IV every 24 hours Continue Azithromycin 500 mg IV every 24 hours Renal adjustment not necessary  Debbie [name], Debbie [name] 11/27/2013,9:57 AM

## 2013-11-27 NOTE — Progress Notes (Signed)
Pt spiked a temp during the night. Tylenol was given. MD was made aware of temp spike. No new orders. Will continue to monitor.

## 2013-11-28 ENCOUNTER — Inpatient Hospital Stay (HOSPITAL_COMMUNITY): Payer: Medicaid Other

## 2013-11-28 LAB — CBC
MCH: 29.5 pg (ref 26.0–34.0)
MCHC: 34 g/dL (ref 30.0–36.0)
MCV: 86.6 fL (ref 78.0–100.0)
Platelets: 185 10*3/uL (ref 150–400)
RDW: 14.6 % (ref 11.5–15.5)

## 2013-11-28 LAB — BASIC METABOLIC PANEL
BUN: 8 mg/dL (ref 6–23)
CO2: 20 mEq/L (ref 19–32)
Calcium: 8.8 mg/dL (ref 8.4–10.5)
Creatinine, Ser: 0.8 mg/dL (ref 0.50–1.10)
Glucose, Bld: 140 mg/dL — ABNORMAL HIGH (ref 70–99)

## 2013-11-28 MED ORDER — IPRATROPIUM BROMIDE 0.02 % IN SOLN
0.5000 mg | Freq: Four times a day (QID) | RESPIRATORY_TRACT | Status: DC
  Administered 2013-11-28 – 2013-12-01 (×12): 0.5 mg via RESPIRATORY_TRACT
  Filled 2013-11-28 (×14): qty 2.5

## 2013-11-28 MED ORDER — SODIUM CHLORIDE 0.9 % IV SOLN
INTRAVENOUS | Status: DC
  Administered 2013-11-28 – 2013-11-29 (×2): via INTRAVENOUS

## 2013-11-28 MED ORDER — VANCOMYCIN HCL IN DEXTROSE 1-5 GM/200ML-% IV SOLN
1000.0000 mg | Freq: Three times a day (TID) | INTRAVENOUS | Status: DC
  Administered 2013-11-28 – 2013-11-29 (×4): 1000 mg via INTRAVENOUS
  Filled 2013-11-28 (×10): qty 200

## 2013-11-28 MED ORDER — ALBUTEROL SULFATE (5 MG/ML) 0.5% IN NEBU: 2.5000 mg | INHALATION_SOLUTION | Freq: Three times a day (TID) | RESPIRATORY_TRACT | Status: DC

## 2013-11-28 MED ORDER — IPRATROPIUM BROMIDE 0.02 % IN SOLN: 0.5000 mg | Freq: Three times a day (TID) | RESPIRATORY_TRACT | Status: DC

## 2013-11-28 MED ORDER — ALBUTEROL SULFATE (5 MG/ML) 0.5% IN NEBU
2.5000 mg | INHALATION_SOLUTION | Freq: Four times a day (QID) | RESPIRATORY_TRACT | Status: DC
  Administered 2013-11-28 – 2013-12-01 (×12): 2.5 mg via RESPIRATORY_TRACT
  Filled 2013-11-28 (×13): qty 0.5

## 2013-11-28 MED ORDER — SODIUM CHLORIDE 0.9 % IV SOLN
1.0000 g | Freq: Three times a day (TID) | INTRAVENOUS | Status: DC
  Administered 2013-11-28 – 2013-11-30 (×7): 1 g via INTRAVENOUS
  Filled 2013-11-28 (×10): qty 1

## 2013-11-28 NOTE — Evaluation (Signed)
Physical Therapy Evaluation Patient Details Name: Debbie [name] MRN: [id number] DOB: [birth year] Today's Date: 11/28/2013 Time: 1415 - 1420    PT Assessment / Plan / Recommendation History of Present Illness    Debbie [name] is a 37 y.o. 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, neurogenic bladder and bowel, self caths herself 3-4 times a day, hypertension, spastic paraplegia, who did not take a flu shot as she thinks she is allergic to it, comes in with 4-5 day history of sore throat and low-grade fevers, she also has a dry cough, she initially went to her PCP and was diagnosed with viral illness and sent home, her fevers continued to get worse and she presented to the ER today where she was febrile, had leukocytosis, chest x-ray was suggestive of interstitial return pneumonia, throat swab was positive for strep pneumonia, I was called to admit the patient for community for pneumonia-strep throat-SIRS.      Clinical Impression  Pt states she is I in transfers with sliding board.  She has had extensive therapy in the past and does not feel she is needs therapy at the present.  PT has already transferred to wheelchair and back to bed with nursing staff.  Pt is in no need of skilled PT at this time as she is at prior functional level.      GP     RUSSELL,CINDY 11/28/2013, 2:23 PM

## 2013-11-28 NOTE — Progress Notes (Signed)
UR chart review completed.  

## 2013-11-28 NOTE — Progress Notes (Signed)
[facility]                                                                                Patient Demographics  Debbie [name], is a 37 y.o. female, DOB - June 15, 1976, ZOX:[id number]  Admit date - 11/26/2013   Admitting Physician Leroy Sea, MD  Outpatient Primary MD for the patient is [name], MD  LOS - 2   Chief Complaint  Patient presents with  . Fever  . Cough        Assessment & Plan    CAP (community acquired pneumonia)/ Strep throat  - Flu negative, despite her great antibiotics are completed for pneumonia which included Rocephin and azithromycin she continues to have fever and chest x-ray actually looks to be slightly worse, she clinically looks stable.  I will place her now on vancomycin, meropenem and continue azithromycin for atypical coverage, noted flu PCR was negative, oxygen nebulizer treatment as needed, increase activity to augment pulmonary toiletry and spitting out her secretions.     Hypotension with h/o HTN (hypertension)  - cont IVF ,  hold HCTZ and Lisinopril , pressure is better with supportive care.    Spastic paraplegia, with neurogenic bladder and bowel - Continue supportive care with muscle relaxants like Zanaflex home dose, patient today clarifies that she does not self caths but uses bedpan, for now she has Foley but she wants to continue, we'll try to remove Foley as soon as possible. Bowel regimen to continue.     UTI  - follow cultures, for now meropenem should suffice.     GERD (gastroesophageal reflux disease)  - cont Protonix      Code Status: Full  Family Communication: Husband in ER none present today  Disposition Plan: Home   Procedures    Consults      Medications  Scheduled Meds: . albuterol  2.5 mg Nebulization QID  . azithromycin  500 mg Intravenous Q24H  . gabapentin  600 mg Oral TID  . heparin  5,000 Units Subcutaneous Q8H  . ipratropium  0.5 mg Nebulization QID  . pantoprazole  40  mg Oral Daily  . sodium chloride  1,000 mL Intravenous Once  . sodium chloride  3 mL Intravenous Q12H  . tiZANidine  6 mg Oral QID   Continuous Infusions: . sodium chloride 75 mL/hr at 11/28/13 0819   PRN Meds:.acetaminophen, diazepam, guaiFENesin-dextromethorphan, HYDROcodone-acetaminophen, menthol-cetylpyridinium, ondansetron (ZOFRAN) IV, ondansetron, polyethylene glycol  DVT Prophylaxis    Heparin   Lab Results  Component Value Date   PLT 185 11/28/2013    Antibiotics     Anti-infectives   Start     Dose/Rate Route Frequency Ordered Stop   11/27/13 2100  cefTRIAXone (ROCEPHIN) 1 g in dextrose 5 % 50 mL IVPB  Status:  Discontinued     1 g 100 mL/hr over 30 Minutes Intravenous Every 24 hours 11/26/13 1759 11/28/13 0836   11/27/13 1800  azithromycin (ZITHROMAX) 500 mg in dextrose 5 % 250 mL IVPB     500 mg 250 mL/hr over 60 Minutes Intravenous Every 24 hours 11/26/13 1908     11/27/13 0000  azithromycin (ZITHROMAX) 500 mg in dextrose 5 %  250 mL IVPB  Status:  Discontinued     500 mg 250 mL/hr over 60 Minutes Intravenous Every 24 hours 11/26/13 1759 11/26/13 1908   11/26/13 1800  oseltamivir (TAMIFLU) capsule 75 mg  Status:  Discontinued     75 mg Oral Daily 11/26/13 1755 11/26/13 1906   11/26/13 1730  cefTRIAXone (ROCEPHIN) 1 g in dextrose 5 % 50 mL IVPB     1 g 100 mL/hr over 30 Minutes Intravenous  Once 11/26/13 1723 11/26/13 2213   11/26/13 1730  azithromycin (ZITHROMAX) 500 mg in dextrose 5 % 250 mL IVPB     500 mg 250 mL/hr over 60 Minutes Intravenous  Once 11/26/13 1723 11/26/13 1858          Subjective:   Debbie [name] today has, No headache, No chest pain, No abdominal pain - No Nausea, No new weakness tingling or numbness, improved  Cough -  NO SOB. Had fever last night.  Objective:   Filed Vitals:   11/27/13 2147 11/28/13 0435 11/28/13 0513 11/28/13 0827  BP: 144/89  132/86   Pulse: 94  86   Temp: 100 F (37.8 C) 102.5 F (39.2 C) 100 F (37.8 C)    TempSrc: Oral  Oral   Resp: 20  20   Height:      Weight:   103.6 kg (228 lb 6.3 oz)   SpO2: 95%  94% 91%    Wt Readings from Last 3 Encounters:  11/28/13 103.6 kg (228 lb 6.3 oz)  11/12/13 99.791 kg (220 lb)  10/10/13 99.791 kg (220 lb)     Intake/Output Summary (Last 24 hours) at 11/28/13 0836 Last data filed at 11/28/13 0500  Gross per 24 hour  Intake      0 ml  Output   3750 ml  Net  -3750 ml    Exam Awake Alert, Oriented X 3, No new F.N deficits, Normal affect [place].AT,PERRAL Supple Neck,No JVD, No cervical lymphadenopathy appriciated.  Symmetrical Chest wall movement, Good air movement bilaterally, Coarse basilar B sounds RRR,No Gallops,Rubs or new Murmurs, No Parasternal Heave +ve B.Sounds, Abd Soft, Non tender, No organomegaly appriciated, No rebound - guarding or rigidity. No Cyanosis, Clubbing or edema, No new Rash or bruise      Data Review   Micro Results Recent Results (from the past 240 hour(s))  CULTURE, BLOOD (ROUTINE X 2)     Status: None   Collection Time    11/26/13  3:42 PM      Result Value Range Status   Specimen Description BLOOD LEFT ANTECUBITAL   Final   Special Requests     Final   Value: BOTTLES DRAWN AEROBIC AND ANAEROBIC AEB=10CC ANA=8CC   Culture NO GROWTH 2 DAYS   Final   Report Status PENDING   Incomplete  RAPID STREP SCREEN     Status: Abnormal   Collection Time    11/26/13  3:50 PM      Result Value Range Status   Streptococcus, Group A Screen (Direct) POSITIVE (*) NEGATIVE Final  URINE CULTURE     Status: None   Collection Time    11/26/13  4:30 PM      Result Value Range Status   Specimen Description URINE, CATHETERIZED   Final   Special Requests NONE   Final   Culture  Setup Time     Final   Value: 11/26/2013 21:40     Performed at [facility] Count  Final   Value: NO GROWTH     Performed at [facility]   Culture     Final   Value: NO GROWTH     Performed at [facility]   Report  Status 11/27/2013 FINAL   Final  CULTURE, BLOOD (ROUTINE X 2)     Status: None   Collection Time    11/26/13  5:59 PM      Result Value Range Status   Specimen Description BLOOD LEFT ANTECUBITAL   Final   Special Requests     Final   Value: BOTTLES DRAWN AEROBIC AND ANAEROBIC AEB=12CC ANA=8CC   Culture NO GROWTH 2 DAYS   Final   Report Status PENDING   Incomplete    Radiology Reports Dg Chest Port 1 View  11/28/2013   CLINICAL DATA:  Pneumonia.  EXAM: PORTABLE CHEST - 1 VIEW  COMPARISON:  11/26/2013  FINDINGS: There is increased infiltrate at both lung bases with consolidation at the right base medially. Heart size and pulmonary vascularity are normal. No osseous abnormality.  IMPRESSION: Increasing bilateral pulmonary infiltrates.   Electronically Signed   By: Geanie Cooley M.D.   On: 11/28/2013 08:17   Dg Chest Port 1 View  11/26/2013   CLINICAL DATA:  Fever and cough.  EXAM: PORTABLE CHEST - 1 VIEW  COMPARISON:  None.  FINDINGS: The lungs are adequately inflated. The interstitial markings are diffusely increased. The cardiopericardial silhouette is top-normal in size. The pulmonary vascularity is not clinically is not clearly engorged. There is no pleural effusion or pneumothorax. The mediastinum is normal in width. The observed portions of the bony thorax exhibit no acute abnormalities.  IMPRESSION: Bilaterally increased interstitial markings suggest interstitial pneumonia. There is no alveolar infiltrate nor definite pulmonary vascular congestion. When the patient can tolerate the procedure, a PA and lateral chest x-ray would be of value.   Electronically Signed   By: David  [place]   On: 11/26/2013 16:07    CBC  Recent Labs Lab 11/26/13 1530 11/27/13 0540 11/28/13 0604  WBC 17.4* 14.3* 17.5*  HGB 11.8* 10.7* 11.2*  HCT 35.4* 31.8* 32.9*  PLT 205 180 185  MCV 87.4 87.1 86.6  MCH 29.1 29.3 29.5  MCHC 33.3 33.6 34.0  RDW 13.9 14.0 14.6  LYMPHSABS 0.9  --   --   MONOABS 0.9  --    --   EOSABS 0.0  --   --   BASOSABS 0.0  --   --     Chemistries   Recent Labs Lab 11/26/13 1530 11/27/13 0540 11/28/13 0604  NA 137 138 136  K 2.9* 3.3* 3.7  CL 99 104 103  CO2 21 22 20   GLUCOSE 122* 126* 140*  BUN 19 13 8   CREATININE 1.28* 0.90 0.80  CALCIUM 9.0 8.5 8.8  AST 8  --   --   ALT 14  --   --   ALKPHOS 84  --   --   BILITOT 0.6  --   --    ------------------------------------------------------------------------------------------------------------------ estimated creatinine clearance is 121.2 ml/min (by C-G formula based on Cr of 0.8). ------------------------------------------------------------------------------------------------------------------ No results found for this basename: HGBA1C,  in the last 72 hours ------------------------------------------------------------------------------------------------------------------ No results found for this basename: CHOL, HDL, LDLCALC, TRIG, CHOLHDL, LDLDIRECT,  in the last 72 hours ------------------------------------------------------------------------------------------------------------------ No results found for this basename: TSH, T4TOTAL, FREET3, T3FREE, THYROIDAB,  in the last 72 hours ------------------------------------------------------------------------------------------------------------------ No results found for this basename: VITAMINB12, FOLATE, FERRITIN, TIBC, IRON, RETICCTPCT,  in the last 72 hours  Coagulation profile No results found for this basename: INR, PROTIME,  in the last 168 hours  No results found for this basename: DDIMER,  in the last 72 hours  Cardiac Enzymes No results found for this basename: CK, CKMB, TROPONINI, MYOGLOBIN,  in the last 168 hours ------------------------------------------------------------------------------------------------------------------ No components found with this basename: POCBNP,      Time Spent in minutes   40   Susa Raring K M.D on 11/28/2013  at 8:36 AM  Between 7am to 7pm - Pager - [phone]  After 7pm go to [email or web address] - password [email or web address]  And look for the night coverage person covering for me after hours  [facility] Office  [phone]

## 2013-11-28 NOTE — Progress Notes (Signed)
ANTIBIOTIC CONSULT NOTE - INITIAL  Pharmacy Consult for Vancomycin & Meropenem Indication: pneumonia  Allergies  Allergen Reactions  . Honey Bee Treatment [Bee Venom] Anaphylaxis and Itching    Patient Measurements: Height: 5' 8.5" (174 cm) Weight: 228 lb 6.3 oz (103.6 kg) IBW/kg (Calculated) : 65.05   Vital Signs: Temp: 99.1 F (37.3 C) (12/26 0827) Temp src: Oral (12/26 0827) BP: 132/86 mmHg (12/26 0513) Pulse Rate: 86 (12/26 0513) Intake/Output from previous day: 12/25 0701 - 12/26 0700 In: -  Out: 3750 [Urine:3750] Intake/Output from this shift: Total I/O In: 120 [P.O.:120] Out: -   Labs:  Recent Labs  11/26/13 1530 11/27/13 0540 11/28/13 0604  WBC 17.4* 14.3* 17.5*  HGB 11.8* 10.7* 11.2*  PLT 205 180 185  CREATININE 1.28* 0.90 0.80   Estimated Creatinine Clearance: 121.2 ml/min (by C-G formula based on Cr of 0.8). No results found for this basename: VANCOTROUGH, Leodis Binet, VANCORANDOM, GENTTROUGH, GENTPEAK, GENTRANDOM, TOBRATROUGH, TOBRAPEAK, TOBRARND, AMIKACINPEAK, AMIKACINTROU, AMIKACIN,  in the last 72 hours   Microbiology: Recent Results (from the past 720 hour(s))  CULTURE, BLOOD (ROUTINE X 2)     Status: None   Collection Time    11/26/13  3:42 PM      Result Value Range Status   Specimen Description BLOOD LEFT ANTECUBITAL   Final   Special Requests     Final   Value: BOTTLES DRAWN AEROBIC AND ANAEROBIC AEB=10CC ANA=8CC   Culture NO GROWTH 2 DAYS   Final   Report Status PENDING   Incomplete  RAPID STREP SCREEN     Status: Abnormal   Collection Time    11/26/13  3:50 PM      Result Value Range Status   Streptococcus, Group A Screen (Direct) POSITIVE (*) NEGATIVE Final  URINE CULTURE     Status: None   Collection Time    11/26/13  4:30 PM      Result Value Range Status   Specimen Description URINE, CATHETERIZED   Final   Special Requests NONE   Final   Culture  Setup Time     Final   Value: 11/26/2013 21:40     Performed at [facility] Count     Final   Value: NO GROWTH     Performed at [facility]   Culture     Final   Value: NO GROWTH     Performed at [facility]   Report Status 11/27/2013 FINAL   Final  CULTURE, BLOOD (ROUTINE X 2)     Status: None   Collection Time    11/26/13  5:59 PM      Result Value Range Status   Specimen Description BLOOD LEFT ANTECUBITAL   Final   Special Requests     Final   Value: BOTTLES DRAWN AEROBIC AND ANAEROBIC AEB=12CC ANA=8CC   Culture NO GROWTH 2 DAYS   Final   Report Status PENDING   Incomplete    Medical History: Past Medical History  Diagnosis Date  . Hypertension   . Spastic paraplegia   . Neurogenic bladder     and bowel  . Mass     T5-T6 intradural mass  . Chronic pain   . Chronic myofascial pain     Medications:  Scheduled:  . albuterol  2.5 mg Nebulization QID  . azithromycin  500 mg Intravenous Q24H  . gabapentin  600 mg Oral TID  . heparin  5,000 Units Subcutaneous Q8H  .  ipratropium  0.5 mg Nebulization QID  . meropenem (MERREM) IV  1 g Intravenous Q8H  . pantoprazole  40 mg Oral Daily  . sodium chloride  1,000 mL Intravenous Once  . sodium chloride  3 mL Intravenous Q12H  . tiZANidine  6 mg Oral QID  . vancomycin  1,000 mg Intravenous Q8H   Assessment: Community acquired pneumonia, strep throat Azithromycin & Ceftriaxone started previously Ceftriaxone discontinued. Vancomycin & Meropenem per consult to be added Patient continues fever, chest x-ray looks slightly worse Obese patient Excellent renal function  Goal of Therapy:  Vancomycin trough level 15-20 mcg/ml  Plan:  Vancomycin 1 GM IV every 8 hours Meropenem 1 GM IV every 8 hours Azithromycin 500 mg IV every 24 hours continued Vancomycin trough at steady state Monitor renal function Labs per protocol  Raquel James, Andria Head Bennett 11/28/2013,9:34 AM

## 2013-11-28 NOTE — Care Management Note (Signed)
    Page 1 of 1   11/28/2013     2:38:50 PM   CARE MANAGEMENT NOTE 11/28/2013  Patient:  Debbie [name], Debbie [name]   Account Number:  [id number]  Date Initiated:  11/28/2013  Documentation initiated by:  Sharrie Rothman  Subjective/Objective Assessment:   Pt admitted from home with pneumonia nd UTI. Pt is a paraplegic and lives with her children and fiance. Pt is able to perform many ADL's on her own with her DME in the home. Pts family is also there to help pt. Pt has a CAP aide M-F, 3.5     Action/Plan:   hours a day. Pt denies any need for [facility] or other DME at this time.   Anticipated DC Date:  12/01/2013   Anticipated DC Plan:  HOME/SELF CARE      DC Planning Services  CM consult      Choice offered to / List presented to:             Status of service:  Completed, signed off Medicare Important Message given?   (If response is "NO", the following Medicare IM given date fields will be blank) Date Medicare IM given:   Date Additional Medicare IM given:    Discharge Disposition:  HOME/SELF CARE  Per UR Regulation:    If discussed at Long Length of Stay Meetings, dates discussed:    Comments:  11/28/13 1430 Arlyss Queen, RN BSN CM

## 2013-11-29 ENCOUNTER — Inpatient Hospital Stay (HOSPITAL_COMMUNITY): Payer: Medicaid Other

## 2013-11-29 LAB — BASIC METABOLIC PANEL
BUN: 7 mg/dL (ref 6–23)
Calcium: 8.5 mg/dL (ref 8.4–10.5)
Chloride: 99 mEq/L (ref 96–112)
GFR calc Af Amer: >90 mL/min (ref >90)
GFR calc non Af Amer: >90 mL/min (ref >90)
Potassium: 3.1 mEq/L — ABNORMAL LOW (ref 3.5–5.1)
Sodium: 134 mEq/L — ABNORMAL LOW (ref 135–145)

## 2013-11-29 LAB — CBC
MCHC: 33.8 g/dL (ref 30.0–36.0)
Platelets: 185 10*3/uL (ref 150–400)
RDW: 14.6 % (ref 11.5–15.5)
WBC: 13.7 10*3/uL — ABNORMAL HIGH (ref 4.0–10.5)

## 2013-11-29 LAB — VANCOMYCIN, TROUGH: Vancomycin Tr: 9 ug/mL — ABNORMAL LOW (ref 10.0–20.0)

## 2013-11-29 MED ORDER — VANCOMYCIN HCL 10 G IV SOLR
1250.0000 mg | Freq: Three times a day (TID) | INTRAVENOUS | Status: DC
  Administered 2013-11-29 – 2013-11-30 (×3): 1250 mg via INTRAVENOUS
  Filled 2013-11-29 (×6): qty 1250

## 2013-11-29 MED ORDER — POTASSIUM CHLORIDE CRYS ER 20 MEQ PO TBCR
40.0000 meq | EXTENDED_RELEASE_TABLET | Freq: Once | ORAL | Status: AC
  Administered 2013-11-29: 40 meq via ORAL
  Filled 2013-11-29: qty 2

## 2013-11-29 MED ORDER — POTASSIUM CHLORIDE IN NACL 20-0.9 MEQ/L-% IV SOLN
INTRAVENOUS | Status: DC
  Administered 2013-11-29: 08:00:00 via INTRAVENOUS

## 2013-11-29 MED ORDER — FUROSEMIDE 20 MG PO TABS
20.0000 mg | ORAL_TABLET | Freq: Once | ORAL | Status: AC
  Administered 2013-11-29: 20 mg via ORAL
  Filled 2013-11-29: qty 1

## 2013-11-29 MED ORDER — IBUPROFEN 600 MG PO TABS
600.0000 mg | ORAL_TABLET | Freq: Four times a day (QID) | ORAL | Status: DC | PRN
  Administered 2013-11-29 (×2): 600 mg via ORAL
  Filled 2013-11-29 (×2): qty 1

## 2013-11-29 NOTE — Progress Notes (Signed)
ANTIBIOTIC CONSULT NOTE   Pharmacy Consult for Vancomycin & Meropenem Indication: pneumonia  Allergies  Allergen Reactions  . Honey Bee Treatment [Bee Venom] Anaphylaxis and Itching    Patient Measurements: Height: 5' 8.5" (174 cm) Weight: 223 lb 15.8 oz (101.6 kg) IBW/kg (Calculated) : 65.05   Vital Signs: Temp: 101.8 F (38.8 C) (12/27 0426) Temp src: Oral (12/27 0426) BP: 150/93 mmHg (12/27 0426) Pulse Rate: 87 (12/27 0426) Intake/Output from previous day: 12/26 0701 - 12/27 0700 In: 830 [P.O.:830] Out: 3050 [Urine:3050] Intake/Output from this shift:    Labs:  Recent Labs  11/27/13 0540 11/28/13 0604 11/29/13 0534  WBC 14.3* 17.5* 13.7*  HGB 10.7* 11.2* 10.6*  PLT 180 185 185  CREATININE 0.90 0.80 0.70   Estimated Creatinine Clearance: 120 ml/min (by C-G formula based on Cr of 0.7).  Recent Labs  11/29/13 0933  VANCOTROUGH 9.0*     Microbiology: Recent Results (from the past 720 hour(s))  CULTURE, BLOOD (ROUTINE X 2)     Status: None   Collection Time    11/26/13  3:42 PM      Result Value Range Status   Specimen Description BLOOD LEFT ANTECUBITAL   Final   Special Requests     Final   Value: BOTTLES DRAWN AEROBIC AND ANAEROBIC AEB=10CC ANA=8CC   Culture NO GROWTH 3 DAYS   Final   Report Status PENDING   Incomplete  RAPID STREP SCREEN     Status: Abnormal   Collection Time    11/26/13  3:50 PM      Result Value Range Status   Streptococcus, Group A Screen (Direct) POSITIVE (*) NEGATIVE Final  URINE CULTURE     Status: None   Collection Time    11/26/13  4:30 PM      Result Value Range Status   Specimen Description URINE, CATHETERIZED   Final   Special Requests NONE   Final   Culture  Setup Time     Final   Value: 11/26/2013 21:40     Performed at [facility] Count     Final   Value: NO GROWTH     Performed at [facility]   Culture     Final   Value: NO GROWTH     Performed at [facility]   Report  Status 11/27/2013 FINAL   Final  CULTURE, BLOOD (ROUTINE X 2)     Status: None   Collection Time    11/26/13  5:59 PM      Result Value Range Status   Specimen Description BLOOD LEFT ANTECUBITAL   Final   Special Requests     Final   Value: BOTTLES DRAWN AEROBIC AND ANAEROBIC AEB=12CC ANA=8CC   Culture NO GROWTH 3 DAYS   Final   Report Status PENDING   Incomplete    Medical History: Past Medical History  Diagnosis Date  . Hypertension   . Spastic paraplegia   . Neurogenic bladder     and bowel  . Mass     T5-T6 intradural mass  . Chronic pain   . Chronic myofascial pain     Medications:  Scheduled:  . albuterol  2.5 mg Nebulization QID  . azithromycin  500 mg Intravenous Q24H  . gabapentin  600 mg Oral TID  . heparin  5,000 Units Subcutaneous Q8H  . ipratropium  0.5 mg Nebulization QID  . meropenem (MERREM) IV  1 g Intravenous Q8H  . pantoprazole  40 mg Oral Daily  . sodium chloride  1,000 mL Intravenous Once  . sodium chloride  3 mL Intravenous Q12H  . tiZANidine  6 mg Oral QID  . vancomycin  1,250 mg Intravenous Q8H   Assessment: Community acquired pneumonia, strep throat Azithromycin & Ceftriaxone started previously Ceftriaxone discontinued. Vancomycin & Meropenem per consult to be added Patient continues fever, chest x-ray looks slightly worse Obese patient Excellent renal function Vancomycin trough below goal  Goal of Therapy:  Vancomycin trough level 15-20 mcg/ml  Plan:  Increase Vancomycin to 1250 mg IV every 8 hours Meropenem 1 GM IV every 8 hours Azithromycin 500 mg IV every 24 hours continued Vancomycin trough at steady state Monitor renal function Labs per protocol  Raquel James, [place] Winslow Bennett 11/29/2013,12:32 PM

## 2013-11-29 NOTE — Progress Notes (Signed)
[facility]                                                                                Patient Demographics  Debbie [name], is a 37 y.o. female, DOB - 01-04-1976, ZOX:[id number]  Admit date - 11/26/2013   Admitting Physician Leroy Sea, MD  Outpatient Primary MD for the patient is [name], MD  LOS - 3   Chief Complaint  Patient presents with  . Fever  . Cough      Brief summary  This is a pleasant 37 y.o. 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, neurogenic bladder and bowel, who uses a bedside commode for urination, hypertension, spastic paraplegia, who did not take a flu shot as she thinks she is allergic to it, comes in with 4-5 day history of sore throat and low-grade fevers, she also has a dry cough, she initially went to her PCP and was diagnosed with viral illness and sent home, her fevers continued to get worse and she presented to the ER today where she was febrile, had leukocytosis, chest x-ray was suggestive of interstitial return pneumonia, throat swab was positive for strep pneumonia, I was called to admit the patient for community for pneumonia-strep throat-SIRS.   Workup here ruled out influenza, she was initially placed on CAP antibiotics without any movement and subsequently switched to H. CAP antibiotics with good effect, she feels better, fever curve is trending down and chest x-ray has stabilized. So far her cultures have remained negative.     Assessment & Plan    CAP (community acquired pneumonia)/ Strep throat  - Flu negative, despite her great antibiotics are completed for pneumonia which included Rocephin and azithromycin she continues to have fever and chest x-ray actually looks to be slightly worse, she clinically looks stable.  I will place her now on vancomycin, meropenem and continue azithromycin for atypical coverage, noted flu PCR was negative, oxygen nebulizer treatment as  needed, increase activity to augment pulmonary toiletry and spitting out her secretions.     Hypotension with h/o HTN (hypertension)  - Stabilized, will discontinue  IVF ,  hold HCTZ and Lisinopril , pressure is better with supportive care.    Spastic paraplegia, with neurogenic bladder and bowel - Continue supportive care with muscle relaxants like Zanaflex home dose, patient today clarifies that she does not self caths but uses bedpan, for now she has Foley but she wants to continue, we'll try to remove Foley today. Bowel regimen to continue.     UTI  - follow cultures, for now meropenem should suffice.     GERD (gastroesophageal reflux disease)  - cont Protonix      Code Status: Full  Family Communication: Husband in ER none present today  Disposition Plan: Home   Procedures    Consults      Medications  Scheduled Meds: . albuterol  2.5 mg Nebulization QID  . azithromycin  500 mg Intravenous Q24H  . gabapentin  600 mg Oral TID  . heparin  5,000 Units Subcutaneous Q8H  . ipratropium  0.5 mg Nebulization QID  . meropenem (MERREM) IV  1 g Intravenous Q8H  . pantoprazole  40 mg Oral Daily  . sodium chloride  1,000 mL Intravenous Once  . sodium chloride  3 mL Intravenous Q12H  . tiZANidine  6 mg Oral QID  . vancomycin  1,000 mg Intravenous Q8H   Continuous Infusions:   PRN Meds:.acetaminophen, diazepam, guaiFENesin-dextromethorphan, HYDROcodone-acetaminophen, ibuprofen, menthol-cetylpyridinium, ondansetron (ZOFRAN) IV, ondansetron, polyethylene glycol  DVT Prophylaxis    Heparin   Lab Results  Component Value Date   PLT 185 11/29/2013    Antibiotics     Anti-infectives   Start     Dose/Rate Route Frequency Ordered Stop   11/28/13 1200  meropenem (MERREM) 1 g in sodium chloride 0.9 % 100 mL IVPB     1 g 200 mL/hr over 30 Minutes Intravenous Every 8 hours 11/28/13 0929     11/28/13 1100  vancomycin (VANCOCIN) IVPB 1000 mg/200 mL premix     1,000  mg 200 mL/hr over 60 Minutes Intravenous Every 8 hours 11/28/13 0929     11/27/13 2100  cefTRIAXone (ROCEPHIN) 1 g in dextrose 5 % 50 mL IVPB  Status:  Discontinued     1 g 100 mL/hr over 30 Minutes Intravenous Every 24 hours 11/26/13 1759 11/28/13 0836   11/27/13 1800  azithromycin (ZITHROMAX) 500 mg in dextrose 5 % 250 mL IVPB     500 mg 250 mL/hr over 60 Minutes Intravenous Every 24 hours 11/26/13 1908     11/27/13 0000  azithromycin (ZITHROMAX) 500 mg in dextrose 5 % 250 mL IVPB  Status:  Discontinued     500 mg 250 mL/hr over 60 Minutes Intravenous Every 24 hours 11/26/13 1759 11/26/13 1908   11/26/13 1800  oseltamivir (TAMIFLU) capsule 75 mg  Status:  Discontinued     75 mg Oral Daily 11/26/13 1755 11/26/13 1906   11/26/13 1730  cefTRIAXone (ROCEPHIN) 1 g in dextrose 5 % 50 mL IVPB     1 g 100 mL/hr over 30 Minutes Intravenous  Once 11/26/13 1723 11/26/13 2213   11/26/13 1730  azithromycin (ZITHROMAX) 500 mg in dextrose 5 % 250 mL IVPB     500 mg 250 mL/hr over 60 Minutes Intravenous  Once 11/26/13 1723 11/26/13 1858          Subjective:   Debbie [name] today has, No headache, No chest pain, No abdominal pain - No Nausea, No new weakness tingling or numbness, improved  Cough -  NO SOB. Had fever last night.  Objective:   Filed Vitals:   11/28/13 1944 11/28/13 2021 11/29/13 0426 11/29/13 0803  BP:  144/73 150/93   Pulse:  97 87   Temp:  98.5 F (36.9 C) 101.8 F (38.8 C)   TempSrc:  Oral Oral   Resp:  20 20   Height:      Weight:   101.6 kg (223 lb 15.8 oz)   SpO2: 94% 99% 97% 91%    Wt Readings from Last 3 Encounters:  11/29/13 101.6 kg (223 lb 15.8 oz)  11/12/13 99.791 kg (220 lb)  10/10/13 99.791 kg (220 lb)     Intake/Output Summary (Last 24 hours) at 11/29/13 0907 Last data filed at 11/29/13 0426  Gross per 24 hour  Intake    710 ml  Output   3050 ml  Net  -2340 ml    Exam Awake Alert, Oriented X 3, No new F.N deficits, Normal  affect [place].AT,PERRAL Supple Neck,No JVD, No cervical lymphadenopathy appriciated.  Symmetrical Chest wall movement, Good  air movement bilaterally, Coarse basilar B sounds RRR,No Gallops,Rubs or new Murmurs, No Parasternal Heave +ve B.Sounds, Abd Soft, Non tender, No organomegaly appriciated, No rebound - guarding or rigidity. No Cyanosis, Clubbing or edema, No new Rash or bruise      Data Review   Micro Results Recent Results (from the past 240 hour(s))  CULTURE, BLOOD (ROUTINE X 2)     Status: None   Collection Time    11/26/13  3:42 PM      Result Value Range Status   Specimen Description BLOOD LEFT ANTECUBITAL   Final   Special Requests     Final   Value: BOTTLES DRAWN AEROBIC AND ANAEROBIC AEB=10CC ANA=8CC   Culture NO GROWTH 2 DAYS   Final   Report Status PENDING   Incomplete  RAPID STREP SCREEN     Status: Abnormal   Collection Time    11/26/13  3:50 PM      Result Value Range Status   Streptococcus, Group A Screen (Direct) POSITIVE (*) NEGATIVE Final  URINE CULTURE     Status: None   Collection Time    11/26/13  4:30 PM      Result Value Range Status   Specimen Description URINE, CATHETERIZED   Final   Special Requests NONE   Final   Culture  Setup Time     Final   Value: 11/26/2013 21:40     Performed at [facility] Count     Final   Value: NO GROWTH     Performed at [facility]   Culture     Final   Value: NO GROWTH     Performed at [facility]   Report Status 11/27/2013 FINAL   Final  CULTURE, BLOOD (ROUTINE X 2)     Status: None   Collection Time    11/26/13  5:59 PM      Result Value Range Status   Specimen Description BLOOD LEFT ANTECUBITAL   Final   Special Requests     Final   Value: BOTTLES DRAWN AEROBIC AND ANAEROBIC AEB=12CC ANA=8CC   Culture NO GROWTH 2 DAYS   Final   Report Status PENDING   Incomplete    Radiology Reports Dg Chest Port 1 View  11/28/2013   CLINICAL DATA:  Pneumonia.  EXAM: PORTABLE CHEST -  1 VIEW  COMPARISON:  11/26/2013  FINDINGS: There is increased infiltrate at both lung bases with consolidation at the right base medially. Heart size and pulmonary vascularity are normal. No osseous abnormality.  IMPRESSION: Increasing bilateral pulmonary infiltrates.   Electronically Signed   By: Geanie Cooley M.D.   On: 11/28/2013 08:17   Dg Chest Port 1 View  11/26/2013   CLINICAL DATA:  Fever and cough.  EXAM: PORTABLE CHEST - 1 VIEW  COMPARISON:  None.  FINDINGS: The lungs are adequately inflated. The interstitial markings are diffusely increased. The cardiopericardial silhouette is top-normal in size. The pulmonary vascularity is not clinically is not clearly engorged. There is no pleural effusion or pneumothorax. The mediastinum is normal in width. The observed portions of the bony thorax exhibit no acute abnormalities.  IMPRESSION: Bilaterally increased interstitial markings suggest interstitial pneumonia. There is no alveolar infiltrate nor definite pulmonary vascular congestion. When the patient can tolerate the procedure, a PA and lateral chest x-ray would be of value.   Electronically Signed   By: David  [place]   On: 11/26/2013 16:07    CBC  Recent  Labs Lab 11/26/13 1530 11/27/13 0540 11/28/13 0604 11/29/13 0534  WBC 17.4* 14.3* 17.5* 13.7*  HGB 11.8* 10.7* 11.2* 10.6*  HCT 35.4* 31.8* 32.9* 31.4*  PLT 205 180 185 185  MCV 87.4 87.1 86.6 85.8  MCH 29.1 29.3 29.5 29.0  MCHC 33.3 33.6 34.0 33.8  RDW 13.9 14.0 14.6 14.6  LYMPHSABS 0.9  --   --   --   MONOABS 0.9  --   --   --   EOSABS 0.0  --   --   --   BASOSABS 0.0  --   --   --     Chemistries   Recent Labs Lab 11/26/13 1530 11/27/13 0540 11/28/13 0604 11/29/13 0534  NA 137 138 136 134*  K 2.9* 3.3* 3.7 3.1*  CL 99 104 103 99  CO2 21 22 20 21   GLUCOSE 122* 126* 140* 107*  BUN 19 13 8 7   CREATININE 1.28* 0.90 0.80 0.70  CALCIUM 9.0 8.5 8.8 8.5  AST 8  --   --   --   ALT 14  --   --   --   ALKPHOS 84  --   --    --   BILITOT 0.6  --   --   --    ------------------------------------------------------------------------------------------------------------------ estimated creatinine clearance is 120 ml/min (by C-G formula based on Cr of 0.7). ------------------------------------------------------------------------------------------------------------------ No results found for this basename: HGBA1C,  in the last 72 hours ------------------------------------------------------------------------------------------------------------------ No results found for this basename: CHOL, HDL, LDLCALC, TRIG, CHOLHDL, LDLDIRECT,  in the last 72 hours ------------------------------------------------------------------------------------------------------------------ No results found for this basename: TSH, T4TOTAL, FREET3, T3FREE, THYROIDAB,  in the last 72 hours ------------------------------------------------------------------------------------------------------------------ No results found for this basename: VITAMINB12, FOLATE, FERRITIN, TIBC, IRON, RETICCTPCT,  in the last 72 hours  Coagulation profile No results found for this basename: INR, PROTIME,  in the last 168 hours  No results found for this basename: DDIMER,  in the last 72 hours  Cardiac Enzymes No results found for this basename: CK, CKMB, TROPONINI, MYOGLOBIN,  in the last 168 hours ------------------------------------------------------------------------------------------------------------------ No components found with this basename: POCBNP,      Time Spent in minutes   40   Susa Raring K M.D on 11/29/2013 at 9:07 AM  Between 7am to 7pm - Pager - [phone]  After 7pm go to [email or web address] - password [email or web address]  And look for the night coverage person covering for me after hours  [facility] Office  [phone]

## 2013-11-30 ENCOUNTER — Inpatient Hospital Stay (HOSPITAL_COMMUNITY): Payer: Medicaid Other

## 2013-11-30 LAB — CBC
MCV: 86 fL (ref 78.0–100.0)
Platelets: 213 10*3/uL (ref 150–400)
RBC: 3.49 MIL/uL — ABNORMAL LOW (ref 3.87–5.11)
RDW: 14.8 % (ref 11.5–15.5)
WBC: 10 10*3/uL (ref 4.0–10.5)

## 2013-11-30 LAB — BASIC METABOLIC PANEL
BUN: 9 mg/dL (ref 6–23)
CO2: 21 mEq/L (ref 19–32)
Chloride: 103 mEq/L (ref 96–112)
GFR calc Af Amer: >90 mL/min (ref >90)
GFR calc non Af Amer: >90 mL/min (ref >90)
Potassium: 3.2 mEq/L — ABNORMAL LOW (ref 3.5–5.1)
Sodium: 137 mEq/L (ref 135–145)

## 2013-11-30 MED ORDER — LEVOFLOXACIN 500 MG PO TABS
500.0000 mg | ORAL_TABLET | Freq: Every day | ORAL | Status: DC
  Administered 2013-11-30 – 2013-12-01 (×2): 500 mg via ORAL
  Filled 2013-11-30 (×2): qty 1

## 2013-11-30 NOTE — Progress Notes (Signed)
[facility]                                                                                Patient Demographics  Debbie [name], is a 37 y.o. female, DOB - [birth year], UJW:[id number]  Admit date - 11/26/2013   Admitting Physician Debbie Sea, MD  Outpatient Primary MD for the patient is Debbie Deiters, MD  LOS - 4   Chief Complaint  Patient presents with  . Fever  . Cough      Brief summary  This is a pleasant 37 y.o. 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, neurogenic bladder and bowel, who uses a bedside commode for urination, hypertension, spastic paraplegia, who did not take a flu shot as she thinks she is allergic to it, comes in with 4-5 day history of sore throat and low-grade fevers, she also has a dry cough, she initially went to her PCP and was diagnosed with viral illness and sent home, her fevers continued to get worse and she presented to the [facility], had leukocytosis, chest x-ray was suggestive of interstitial return pneumonia, throat swab was positive for strep pneumonia   Workup here ruled out influenza, she was initially placed on CAP antibiotics without any movement and subsequently switched to H. CAP antibiotics with good effect     Assessment & Plan    CAP (community acquired pneumonia)/ Strep throat  - Flu negative, despite inintalantibiotics are completed for pneumonia which included Rocephin and azithromycin she continues to have fever and chest x-ray actually looks to be slightly worse, she clinically looks stable.  Transitioned tovancomycin, meropenem and continue azithromycin for atypical coverage, noted flu PCR was negative,   wean on 12/28 Levaquin monotherapy    Hypotension with h/o HTN (hypertension)  - Stabilized, will discontinue  IVF ,  hold HCTZ and Lisinopril , pressure is better with supportive care.    Spastic paraplegia, with neurogenic bladder and bowel - Continue  supportive care with muscle relaxants like Zanaflex home dose, patient today clarifies that she does not self caths but uses bedpan, for now she has Foley but she wants to continue, we'll try to remove Foley today. Bowel regimen to continue.   UTI   cultures are negative     GERD (gastroesophageal reflux disease)  - cont Protonix      Code Status: Full  Family Communication:  None present  Disposition Plan: Home hopefully in one to 2 days   Procedures    Consults      Medications  Scheduled Meds: . albuterol  2.5 mg Nebulization QID  . azithromycin  500 mg Intravenous Q24H  . gabapentin  600 mg Oral TID  . heparin  5,000 Units Subcutaneous Q8H  . ipratropium  0.5 mg Nebulization QID  . meropenem (MERREM) IV  1 g Intravenous Q8H  . pantoprazole  40 mg Oral Daily  . sodium chloride  1,000 mL Intravenous Once  . sodium chloride  3 mL Intravenous Q12H  . tiZANidine  6 mg Oral QID  . vancomycin  1,250 mg Intravenous Q8H   Continuous Infusions:   PRN Meds:.acetaminophen, diazepam, guaiFENesin-dextromethorphan, HYDROcodone-acetaminophen,  ibuprofen, menthol-cetylpyridinium, ondansetron (ZOFRAN) IV, ondansetron, polyethylene glycol  DVT Prophylaxis    Heparin   Lab Results  Component Value Date   PLT 213 11/30/2013    Antibiotics     Anti-infectives   Start     Dose/Rate Route Frequency Ordered Stop   11/30/13 1845  levofloxacin (LEVAQUIN) tablet 500 mg     500 mg Oral Daily 11/30/13 1843     11/29/13 1900  vancomycin (VANCOCIN) 1,250 mg in sodium chloride 0.9 % 250 mL IVPB  Status:  Discontinued     1,250 mg 166.7 mL/hr over 90 Minutes Intravenous Every 8 hours 11/29/13 1231 11/30/13 1843   11/28/13 1200  meropenem (MERREM) 1 g in sodium chloride 0.9 % 100 mL IVPB  Status:  Discontinued     1 g 200 mL/hr over 30 Minutes Intravenous Every 8 hours 11/28/13 0929 11/30/13 1843   11/28/13 1100  vancomycin (VANCOCIN) IVPB 1000 mg/200 mL premix  Status:  Discontinued      1,000 mg 200 mL/hr over 60 Minutes Intravenous Every 8 hours 11/28/13 0929 11/29/13 1231   11/27/13 2100  cefTRIAXone (ROCEPHIN) 1 g in dextrose 5 % 50 mL IVPB  Status:  Discontinued     1 g 100 mL/hr over 30 Minutes Intravenous Every 24 hours 11/26/13 1759 11/28/13 0836   11/27/13 1800  azithromycin (ZITHROMAX) 500 mg in dextrose 5 % 250 mL IVPB  Status:  Discontinued     500 mg 250 mL/hr over 60 Minutes Intravenous Every 24 hours 11/26/13 1908 11/30/13 1843   11/27/13 0000  azithromycin (ZITHROMAX) 500 mg in dextrose 5 % 250 mL IVPB  Status:  Discontinued     500 mg 250 mL/hr over 60 Minutes Intravenous Every 24 hours 11/26/13 1759 11/26/13 1908   11/26/13 1800  oseltamivir (TAMIFLU) capsule 75 mg  Status:  Discontinued     75 mg Oral Daily 11/26/13 1755 11/26/13 1906   11/26/13 1730  cefTRIAXone (ROCEPHIN) 1 g in dextrose 5 % 50 mL IVPB     1 g 100 mL/hr over 30 Minutes Intravenous  Once 11/26/13 1723 11/26/13 2213   11/26/13 1730  azithromycin (ZITHROMAX) 500 mg in dextrose 5 % 250 mL IVPB     500 mg 250 mL/hr over 60 Minutes Intravenous  Once 11/26/13 1723 11/26/13 1858          Subjective:   Debbie [name] today has, No headache, No chest pain, No abdominal pain - No Nausea, No new weakness tingling or numbness, improved  Cough -  NO SOB. Had fever last night.  Objective:   Filed Vitals:   11/30/13 0705 11/30/13 1106 11/30/13 1354 11/30/13 1527  BP:   153/94   Pulse:   96   Temp:   98.7 F (37.1 C)   TempSrc:   Oral   Resp:   20   Height:      Weight:      SpO2: 95% 96% 96% 94%    Wt Readings from Last 3 Encounters:  11/30/13 102.4 kg (225 lb 12 oz)  11/12/13 99.791 kg (220 lb)  10/10/13 99.791 kg (220 lb)     Intake/Output Summary (Last 24 hours) at 11/30/13 1831 Last data filed at 11/30/13 1805  Gross per 24 hour  Intake    900 ml  Output    550 ml  Net    350 ml    Exam Awake Alert, Oriented X 3, No new F.N deficits, Normal  affect [place].AT,PERRAL Supple  Neck,No JVD, No cervical lymphadenopathy appriciated.  Symmetrical Chest wall movement, Good air movement bilaterally, Coarse basilar B sounds RRR,No Gallops,Rubs or new Murmurs, No Parasternal Heave +ve B.Sounds, Abd Soft, Non tender, No organomegaly appriciated, No rebound - guarding or rigidity. No Cyanosis, Clubbing or edema, No new Rash or bruise      Data Review   Micro Results Recent Results (from the past 240 hour(s))  CULTURE, BLOOD (ROUTINE X 2)     Status: None   Collection Time    11/26/13  3:42 PM      Result Value Range Status   Specimen Description BLOOD LEFT ANTECUBITAL   Final   Special Requests     Final   Value: BOTTLES DRAWN AEROBIC AND ANAEROBIC AEB=10CC ANA=8CC   Culture NO GROWTH 4 DAYS   Final   Report Status PENDING   Incomplete  RAPID STREP SCREEN     Status: Abnormal   Collection Time    11/26/13  3:50 PM      Result Value Range Status   Streptococcus, Group A Screen (Direct) POSITIVE (*) NEGATIVE Final  URINE CULTURE     Status: None   Collection Time    11/26/13  4:30 PM      Result Value Range Status   Specimen Description URINE, CATHETERIZED   Final   Special Requests NONE   Final   Culture  Setup Time     Final   Value: 11/26/2013 21:40     Performed at [facility] Count     Final   Value: NO GROWTH     Performed at [facility]   Culture     Final   Value: NO GROWTH     Performed at [facility]   Report Status 11/27/2013 FINAL   Final  CULTURE, BLOOD (ROUTINE X 2)     Status: None   Collection Time    11/26/13  5:59 PM      Result Value Range Status   Specimen Description BLOOD LEFT ANTECUBITAL   Final   Special Requests     Final   Value: BOTTLES DRAWN AEROBIC AND ANAEROBIC AEB=12CC ANA=8CC   Culture NO GROWTH 4 DAYS   Final   Report Status PENDING   Incomplete  CULTURE, BLOOD (ROUTINE X 2)     Status: None   Collection Time    11/29/13  2:45 PM      Result Value  Range Status   Specimen Description RIGHT ANTECUBITAL   Final   Special Requests BOTTLES DRAWN AEROBIC AND ANAEROBIC 10CC EACH   Final   Culture NO GROWTH 1 DAY   Final   Report Status PENDING   Incomplete  CULTURE, BLOOD (ROUTINE X 2)     Status: None   Collection Time    11/29/13  2:51 PM      Result Value Range Status   Specimen Description LEFT ANTECUBITAL   Final   Special Requests BOTTLES DRAWN AEROBIC AND ANAEROBIC 12CC EACH   Final   Culture NO GROWTH 1 DAY   Final   Report Status PENDING   Incomplete    Radiology Reports Dg Chest Port 1 View  11/28/2013   CLINICAL DATA:  Pneumonia.  EXAM: PORTABLE CHEST - 1 VIEW  COMPARISON:  11/26/2013  FINDINGS: There is increased infiltrate at both lung bases with consolidation at the right base medially. Heart size and pulmonary vascularity are normal. No osseous abnormality.  IMPRESSION: Increasing  bilateral pulmonary infiltrates.   Electronically Signed   By: Geanie Cooley M.D.   On: 11/28/2013 08:17   Dg Chest Port 1 View  11/26/2013   CLINICAL DATA:  Fever and cough.  EXAM: PORTABLE CHEST - 1 VIEW  COMPARISON:  None.  FINDINGS: The lungs are adequately inflated. The interstitial markings are diffusely increased. The cardiopericardial silhouette is top-normal in size. The pulmonary vascularity is not clinically is not clearly engorged. There is no pleural effusion or pneumothorax. The mediastinum is normal in width. The observed portions of the bony thorax exhibit no acute abnormalities.  IMPRESSION: Bilaterally increased interstitial markings suggest interstitial pneumonia. There is no alveolar infiltrate nor definite pulmonary vascular congestion. When the patient can tolerate the procedure, a PA and lateral chest x-ray would be of value.   Electronically Signed   By: David  [place]   On: 11/26/2013 16:07    CBC  Recent Labs Lab 11/26/13 1530 11/27/13 0540 11/28/13 0604 11/29/13 0534 11/30/13 0603  WBC 17.4* 14.3* 17.5* 13.7* 10.0   HGB 11.8* 10.7* 11.2* 10.6* 10.1*  HCT 35.4* 31.8* 32.9* 31.4* 30.0*  PLT 205 180 185 185 213  MCV 87.4 87.1 86.6 85.8 86.0  MCH 29.1 29.3 29.5 29.0 28.9  MCHC 33.3 33.6 34.0 33.8 33.7  RDW 13.9 14.0 14.6 14.6 14.8  LYMPHSABS 0.9  --   --   --   --   MONOABS 0.9  --   --   --   --   EOSABS 0.0  --   --   --   --   BASOSABS 0.0  --   --   --   --     Chemistries   Recent Labs Lab 11/26/13 1530 11/27/13 0540 11/28/13 0604 11/29/13 0534 11/30/13 0603  NA 137 138 136 134* 137  K 2.9* 3.3* 3.7 3.1* 3.2*  CL 99 104 103 99 103  CO2 21 22 20 21 21   GLUCOSE 122* 126* 140* 107* 107*  BUN 19 13 8 7 9   CREATININE 1.28* 0.90 0.80 0.70 0.62  CALCIUM 9.0 8.5 8.8 8.5 8.8  AST 8  --   --   --   --   ALT 14  --   --   --   --   ALKPHOS 84  --   --   --   --   BILITOT 0.6  --   --   --   --    ------------------------------------------------------------------------------------------------------------------ estimated creatinine clearance is 120.4 ml/min (by C-G formula based on Cr of 0.62). ------------------------------------------------------------------------------------------------------------------ No results found for this basename: HGBA1C,  in the last 72 hours ------------------------------------------------------------------------------------------------------------------ No results found for this basename: CHOL, HDL, LDLCALC, TRIG, CHOLHDL, LDLDIRECT,  in the last 72 hours ------------------------------------------------------------------------------------------------------------------ No results found for this basename: TSH, T4TOTAL, FREET3, T3FREE, THYROIDAB,  in the last 72 hours ------------------------------------------------------------------------------------------------------------------ No results found for this basename: VITAMINB12, FOLATE, FERRITIN, TIBC, IRON, RETICCTPCT,  in the last 72 hours  Coagulation profile No results found for this basename: INR, PROTIME,   in the last 168 hours  No results found for this basename: DDIMER,  in the last 72 hours  Cardiac Enzymes No results found for this basename: CK, CKMB, TROPONINI, MYOGLOBIN,  in the last 168 hours ------------------------------------------------------------------------------------------------------------------ No components found with this basename: POCBNP,      Time Spent in minutes   20  Pleas Koch, MD [facility] [phone] the

## 2013-12-01 ENCOUNTER — Ambulatory Visit: Payer: PRIVATE HEALTH INSURANCE | Admitting: Physical Therapy

## 2013-12-01 LAB — CBC WITH DIFFERENTIAL/PLATELET
Basophils Absolute: 0.1 10*3/uL (ref 0.0–0.1)
Basophils Relative: 1 % (ref 0–1)
Eosinophils Absolute: 0.4 10*3/uL (ref 0.0–0.7)
Eosinophils Relative: 3 % (ref 0–5)
Lymphocytes Relative: 29 % (ref 12–46)
Lymphs Abs: 3.6 10*3/uL (ref 0.7–4.0)
MCHC: 33.3 g/dL (ref 30.0–36.0)
MCV: 86 fL (ref 78.0–100.0)
Neutrophils Relative %: 59 % (ref 43–77)
Platelets: 254 10*3/uL (ref 150–400)
RBC: 3.56 MIL/uL — ABNORMAL LOW (ref 3.87–5.11)
RDW: 14.6 % (ref 11.5–15.5)
WBC: 12.3 10*3/uL — ABNORMAL HIGH (ref 4.0–10.5)

## 2013-12-01 LAB — EXPECTORATED SPUTUM ASSESSMENT W GRAM STAIN, RFLX TO RESP C

## 2013-12-01 LAB — BASIC METABOLIC PANEL
BUN: 6 mg/dL (ref 6–23)
Calcium: 8.9 mg/dL (ref 8.4–10.5)
Creatinine, Ser: 0.62 mg/dL (ref 0.50–1.10)
GFR calc non Af Amer: >90 mL/min (ref >90)
Glucose, Bld: 100 mg/dL — ABNORMAL HIGH (ref 70–99)
Sodium: 138 mEq/L (ref 135–145)

## 2013-12-01 LAB — CULTURE, BLOOD (ROUTINE X 2): Culture: NO GROWTH

## 2013-12-01 MED ORDER — INFLUENZA VAC SPLIT QUAD 0.5 ML IM SUSP
0.5000 mL | INTRAMUSCULAR | Status: DC
  Filled 2013-12-01: qty 0.5

## 2013-12-01 MED ORDER — ALBUTEROL SULFATE (5 MG/ML) 0.5% IN NEBU: 2.5000 mg | INHALATION_SOLUTION | Freq: Four times a day (QID) | RESPIRATORY_TRACT | Status: DC

## 2013-12-01 MED ORDER — GUAIFENESIN-DM 100-10 MG/5ML PO SYRP: 5.0000 mL | ORAL_SOLUTION | ORAL | Status: DC | PRN

## 2013-12-01 MED ORDER — IPRATROPIUM-ALBUTEROL 0.5-2.5 (3) MG/3ML IN SOLN
3.0000 mL | Freq: Four times a day (QID) | RESPIRATORY_TRACT | Status: DC
Start: 2013-12-01 — End: 2013-12-01
  Administered 2013-12-01: 3 mL via RESPIRATORY_TRACT
  Filled 2013-12-01: qty 3

## 2013-12-01 MED ORDER — LEVOFLOXACIN 500 MG PO TABS: 500.0000 mg | ORAL_TABLET | Freq: Every day | ORAL | Status: DC

## 2013-12-01 NOTE — Progress Notes (Signed)
12/01/13 1638 Reviewed discharge instructions with patient, fiance at bedside. Given copy of instructions, medication list, f/u appointment information, prescriptions called in to her pharmacy per MD. Note from MD to resume physical therapy given. Discussed pneumonia education sheet, patient verbalizes understanding of when to call MD via teachback. IV site d/c'd, within normal limits. No c/o pain or discomfort at time of discharge. Pt left floor in stable condition via w/c accompanied by nurse tech and her fiance. Earnstine Regal, RN

## 2013-12-01 NOTE — Discharge Summary (Signed)
Physician Discharge Summary  Debbie [name] LKG:[id number] DOB: [birth year] DOA: 11/26/2013  PCP: [name], MD  Admit date: 11/26/2013 Discharge date: 12/01/2013  Time spent: 20 minutes  Recommendations for Outpatient Follow-up:  1. Stop date for Levaquin = 12/05/2013 2. May resume physical therapy for her paraplegia as of 12/02/2013 3. 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 She was given repeated fluid boluses and has foul-smelling urine. She does not self catheterize. She is able to use her lower extremity to some extent. She was initially placed on community-acquired pneumonia or cough however because of escalation of symptoms and fever was placed on health healthcare associated pneumonia coverage. This was subsequently D. Escalate after 5 days of therapy 2 monotherapy with Levaquin on 12/28. She's been no fevers or chills since and feels close to her normal self. She does state that albuterol and Atrovent help with some of the coughing and the symptomatology that she's had. As such she was prescribed albuterol nebulizer solution as her son is an asthmatic and she knows how to use this. Next I results given Robitussin and a  prescription for 4 more days of antibiotics ending 12/05/13. She's been counseled that if she has further symptoms cough fever chills, she is to return to see either her physician or come back to the emergency room. It was felt that she met maximal medical benefit from hospitalization on day of discharge    Discharge Exam: Filed Vitals:   12/01/13 1353  BP: 105/71  Pulse: 81  Temp: 98.5 F (36.9 C)  Resp: 20   Doing well, sleeping, no specific distress Coughing still but this is better. No fevers no chills Known as no vomiting tolerating diet  General: EOMI, NCAT Cardiovascular: S1-S2 no murmur rub or gallop Respiratory: clinically clear  Discharge Instructions  Discharge Orders   Future Appointments Provider Department Dept Phone   01/13/2014 10:40 AM Ranelle Oyster, MD [facility] [phone]   Future Orders Complete By Expires   Diet - low sodium heart healthy  As directed    Discharge instructions  As directed    Comments:     Finish your complete course of antibiotics completely Take albuterol PRN Take cough syrup until you feel better then stop See your MD in about 5-7 days  Eat plain yogurhut once daily while on ABX I have Rx you albuterol nebs at home to use with your son's machine-when u start feeling better, stop using this  You were cared for by a hospitalist during your hospital stay. If you have any questions about your discharge medications or the care you received while you were in the hospital after you are discharged, you can call the unit and asked  to speak with the hospitalist on call if the hospitalist that took care of you is not available. Once you are discharged, your primary care physician will handle any further medical issues. Please note that NO REFILLS for any discharge medications will be authorized once you are discharged, as it is imperative that you return to your primary care physician (or establish a  relationship with a primary care physician if you do not have one) for your aftercare needs so that they can reassess your need for medications and monitor your lab values. If you do not have a primary care physician, you can call [phone] for a physician referral.   Increase activity slowly  As directed        Medication List    STOP taking these medications       MUCINEX SINUS-MAX CONGESTION 5-325-200 MG Tabs  Generic drug:  Phenylephrine-APAP-Guaifenesin      TAKE these medications       ADVIL 200 MG tablet  Generic drug:  ibuprofen  Take 200 mg by mouth every 6 (six) hours as needed.     albuterol (5 MG/ML) 0.5% nebulizer solution  Commonly known as:  PROVENTIL  Take 0.5 mLs (2.5 mg total) by nebulization 4 (four) times daily.     diazepam 5 MG tablet  Commonly known as:  VALIUM  Take 1 tablet (5 mg total) by mouth every 8 (eight) hours as needed (spasm).     gabapentin 300 MG capsule  Commonly known as:  NEURONTIN  Take 2 capsules (600 mg total) by mouth 3 (three) times daily.     guaiFENesin-dextromethorphan 100-10 MG/5ML syrup  Commonly known as:  ROBITUSSIN DM  Take 5 mLs by mouth every 4 (four) hours as needed for cough.     hydrochlorothiazide 12.5 MG capsule  Commonly known as:  MICROZIDE  Take 1 capsule (12.5 mg total) by mouth daily.     levofloxacin 500 MG tablet  Commonly known as:  LEVAQUIN  Take 1 tablet (500 mg total) by mouth daily.     lisinopril 40 MG tablet  Commonly known as:  PRINIVIL,ZESTRIL  Take 1 tablet (40 mg total) by mouth daily.     meloxicam 15 MG tablet  Commonly known as:  MOBIC  Take 1 tablet (15 mg total) by mouth daily.     oxyCODONE 5 MG immediate release tablet  Commonly known as:  Oxy IR/ROXICODONE  Take 1 tablet (5 mg total) by mouth every 6 (six) hours as needed.     pantoprazole 40 MG tablet  Commonly known as:  PROTONIX  Take 1 tablet (40 mg total) by mouth daily.     tiZANidine 4 MG tablet  Commonly known as:   ZANAFLEX  Take 1.5 tablets (6 mg total) by mouth 4 (four) times daily.       Allergies  Allergen Reactions  . Honey Bee Treatment [Bee Venom] Anaphylaxis and Itching       Follow-up Information   Follow up with HASANAJ,XAJE A, MD. Schedule an appointment as soon as possible for a visit in 5 days.   Specialty:  Internal Medicine   Contact information:   [address] [place] [address] [phone]        The results of significant diagnostics from this hospitalization (including imaging, microbiology, ancillary and laboratory) are listed below for reference.    Significant Diagnostic Studies: Dg Chest Port 1 View  11/30/2013   CLINICAL DATA:  Pneumonia  EXAM: PORTABLE CHEST -  1 VIEW  COMPARISON:  11/29/2013  FINDINGS: Upper normal heart size. Bibasilar airspace disease is stable. No pneumothorax. No pleural effusion.  IMPRESSION: Stable bibasilar airspace disease.   Electronically Signed   By: Maryclare Bean M.D.   On: 11/30/2013 09:38   Dg Chest Port 1 View  11/29/2013   CLINICAL DATA:  Pneumonia.  EXAM: PORTABLE CHEST - 1 VIEW  COMPARISON:  11/28/2013.  FINDINGS: Relatively stable bibasilar infiltrates and associated interstitial edema. No significant pleural fluid is identified. The heart size is stable.  IMPRESSION: Stable bibasilar infiltrates and interstitial edema.   Electronically Signed   By: Irish Lack M.D.   On: 11/29/2013 08:16   Dg Chest Port 1 View  11/28/2013   CLINICAL DATA:  Pneumonia.  EXAM: PORTABLE CHEST - 1 VIEW  COMPARISON:  11/26/2013  FINDINGS: There is increased infiltrate at both lung bases with consolidation at the right base medially. Heart size and pulmonary vascularity are normal. No osseous abnormality.  IMPRESSION: Increasing bilateral pulmonary infiltrates.   Electronically Signed   By: Geanie Cooley M.D.   On: 11/28/2013 08:17   Dg Chest Port 1 View  11/26/2013   CLINICAL DATA:  Fever and cough.  EXAM: PORTABLE CHEST - 1 VIEW  COMPARISON:   None.  FINDINGS: The lungs are adequately inflated. The interstitial markings are diffusely increased. The cardiopericardial silhouette is top-normal in size. The pulmonary vascularity is not clinically is not clearly engorged. There is no pleural effusion or pneumothorax. The mediastinum is normal in width. The observed portions of the bony thorax exhibit no acute abnormalities.  IMPRESSION: Bilaterally increased interstitial markings suggest interstitial pneumonia. There is no alveolar infiltrate nor definite pulmonary vascular congestion. When the patient can tolerate the procedure, a PA and lateral chest x-ray would be of value.   Electronically Signed   By: David  [place]   On: 11/26/2013 16:07    Microbiology: Recent Results (from the past 240 hour(s))  CULTURE, BLOOD (ROUTINE X 2)     Status: None   Collection Time    11/26/13  3:42 PM      Result Value Range Status   Specimen Description BLOOD LEFT ANTECUBITAL   Final   Special Requests     Final   Value: BOTTLES DRAWN AEROBIC AND ANAEROBIC AEB=10CC ANA=8CC   Culture NO GROWTH 5 DAYS   Final   Report Status 12/01/2013 FINAL   Final  RAPID STREP SCREEN     Status: Abnormal   Collection Time    11/26/13  3:50 PM      Result Value Range Status   Streptococcus, Group A Screen (Direct) POSITIVE (*) NEGATIVE Final  URINE CULTURE     Status: None   Collection Time    11/26/13  4:30 PM      Result Value Range Status   Specimen Description URINE, CATHETERIZED   Final   Special Requests NONE   Final   Culture  Setup Time     Final   Value: 11/26/2013 21:40     Performed at [facility] Count     Final   Value: NO GROWTH     Performed at [facility]   Culture     Final   Value: NO GROWTH     Performed at [facility]   Report Status 11/27/2013 FINAL   Final  CULTURE, BLOOD (ROUTINE X 2)     Status: None   Collection Time  11/26/13  5:59 PM      Result Value Range Status   Specimen Description  BLOOD LEFT ANTECUBITAL   Final   Special Requests     Final   Value: BOTTLES DRAWN AEROBIC AND ANAEROBIC AEB=12CC ANA=8CC   Culture NO GROWTH 5 DAYS   Final   Report Status 12/01/2013 FINAL   Final  CULTURE, BLOOD (ROUTINE X 2)     Status: None   Collection Time    11/29/13  2:45 PM      Result Value Range Status   Specimen Description RIGHT ANTECUBITAL   Final   Special Requests BOTTLES DRAWN AEROBIC AND ANAEROBIC 10CC EACH   Final   Culture NO GROWTH 2 DAYS   Final   Report Status PENDING   Incomplete  CULTURE, BLOOD (ROUTINE X 2)     Status: None   Collection Time    11/29/13  2:51 PM      Result Value Range Status   Specimen Description LEFT ANTECUBITAL   Final   Special Requests BOTTLES DRAWN AEROBIC AND ANAEROBIC 12CC EACH   Final   Culture NO GROWTH 2 DAYS   Final   Report Status PENDING   Incomplete  CULTURE, EXPECTORATED SPUTUM-ASSESSMENT     Status: None   Collection Time    11/30/13  5:39 PM      Result Value Range Status   Specimen Description SPUTUM   Final   Special Requests NONE   Final   Sputum evaluation     Final   Value: MICROSCOPIC FINDINGS SUGGEST THAT THIS SPECIMEN IS NOT REPRESENTATIVE OF LOWER RESPIRATORY SECRETIONS. PLEASE RECOLLECT.   Report Status 12/01/2013 FINAL   Final     Labs: Basic Metabolic Panel:  Recent Labs Lab 11/27/13 0540 11/28/13 0604 11/29/13 0534 11/30/13 0603 12/01/13 0525  NA 138 136 134* 137 138  K 3.3* 3.7 3.1* 3.2* 3.0*  CL 104 103 99 103 101  CO2 22 20 21 21 24   GLUCOSE 126* 140* 107* 107* 100*  BUN 13 8 7 9 6   CREATININE 0.90 0.80 0.70 0.62 0.62  CALCIUM 8.5 8.8 8.5 8.8 8.9   Liver Function Tests:  Recent Labs Lab 11/26/13 1530  AST 8  ALT 14  ALKPHOS 84  BILITOT 0.6  PROT 7.2  ALBUMIN 3.4*   No results found for this basename: LIPASE, AMYLASE,  in the last 168 hours No results found for this basename: AMMONIA,  in the last 168 hours CBC:  Recent Labs Lab 11/26/13 1530 11/27/13 0540 11/28/13 0604  11/29/13 0534 11/30/13 0603 12/01/13 0525  WBC 17.4* 14.3* 17.5* 13.7* 10.0 12.3*  NEUTROABS 15.6*  --   --   --   --  7.2  HGB 11.8* 10.7* 11.2* 10.6* 10.1* 10.2*  HCT 35.4* 31.8* 32.9* 31.4* 30.0* 30.6*  MCV 87.4 87.1 86.6 85.8 86.0 86.0  PLT 205 180 185 185 213 254   Cardiac Enzymes: No results found for this basename: CKTOTAL, CKMB, CKMBINDEX, TROPONINI,  in the last 168 hours BNP: BNP (last 3 results) No results found for this basename: PROBNP,  in the last 8760 hours CBG: No results found for this basename: GLUCAP,  in the last 168 hours     Signed:  Rhetta Mura  [facility] 12/01/2013, 2:39 PM

## 2013-12-01 NOTE — Plan of Care (Signed)
Problem: Phase I Progression Outcomes Goal: OOB as tolerated unless otherwise ordered Outcome: Adequate for Discharge 12/01/13 1629 patient up to chair/wheelchair at home. Assisted up to wheelchair for discharge home, pt able to pivot from bed to chair as she does at home. Home health already in place per case management [facility]. Pt may resume physical therapy on 12/03/13 per MD note. Devin Going, RN

## 2013-12-01 NOTE — Progress Notes (Signed)
12/01/13 1237 Patient noted to have K+ level 3.0 on morning labs. Text-paged Dr. Mahala Menghini to notify. Earnstine Regal, RN

## 2013-12-02 ENCOUNTER — Ambulatory Visit: Payer: Medicaid Other | Admitting: Rehabilitative and Restorative Service Providers"

## 2013-12-03 ENCOUNTER — Ambulatory Visit: Payer: PRIVATE HEALTH INSURANCE | Admitting: Rehabilitative and Restorative Service Providers"

## 2013-12-04 LAB — CULTURE, BLOOD (ROUTINE X 2)
Culture: NO GROWTH
Culture: NO GROWTH

## 2013-12-23 ENCOUNTER — Telehealth: Payer: Self-pay

## 2013-12-23 MED ORDER — DIAZEPAM 5 MG PO TABS: 5.0000 mg | ORAL_TABLET | Freq: Three times a day (TID) | ORAL | Status: DC | PRN

## 2013-12-23 NOTE — Telephone Encounter (Signed)
Patient is requesting a refill of Diazepam. Refill sent to pharmacy. Patient notified.

## 2013-12-25 ENCOUNTER — Telehealth: Payer: Self-pay | Admitting: Physical Medicine & Rehabilitation

## 2013-12-25 NOTE — Telephone Encounter (Signed)
[facility] is paying for her PT.  They are requesting a letter stating that she needs to continue PT, and how long she might need it.  Please fax to [phone].

## 2013-12-26 ENCOUNTER — Encounter: Payer: Self-pay | Admitting: Physical Medicine & Rehabilitation

## 2013-12-26 NOTE — Telephone Encounter (Signed)
done

## 2013-12-26 NOTE — Telephone Encounter (Signed)
Letter for therapy faxed to [phone]

## 2013-12-31 ENCOUNTER — Telehealth: Payer: Self-pay

## 2013-12-31 NOTE — Telephone Encounter (Signed)
Patient called to see if letter had been faxed like she requested.

## 2013-12-31 NOTE — Telephone Encounter (Signed)
Left message for patient to call back with a different fax number to send her letter to.

## 2014-01-13 ENCOUNTER — Encounter: Payer: Medicaid Other | Attending: Physical Medicine & Rehabilitation | Admitting: Physical Medicine & Rehabilitation

## 2014-01-13 ENCOUNTER — Encounter: Payer: Self-pay | Admitting: Physical Medicine & Rehabilitation

## 2014-01-13 VITALS — BP 93/61 | HR 65 | Resp 14 | Ht 68.0 in | Wt 211.0 lb

## 2014-01-13 DIAGNOSIS — K592 Neurogenic bowel, not elsewhere classified: Secondary | ICD-10-CM

## 2014-01-13 DIAGNOSIS — G959 Disease of spinal cord, unspecified: Secondary | ICD-10-CM | POA: Insufficient documentation

## 2014-01-13 DIAGNOSIS — G8220 Paraplegia, unspecified: Secondary | ICD-10-CM

## 2014-01-13 DIAGNOSIS — M778 Other enthesopathies, not elsewhere classified: Secondary | ICD-10-CM

## 2014-01-13 DIAGNOSIS — G9520 Unspecified cord compression: Secondary | ICD-10-CM

## 2014-01-13 DIAGNOSIS — IMO0002 Reserved for concepts with insufficient information to code with codable children: Secondary | ICD-10-CM

## 2014-01-13 DIAGNOSIS — IMO0001 Reserved for inherently not codable concepts without codable children: Secondary | ICD-10-CM | POA: Insufficient documentation

## 2014-01-13 DIAGNOSIS — N319 Neuromuscular dysfunction of bladder, unspecified: Secondary | ICD-10-CM

## 2014-01-13 DIAGNOSIS — M6580 Other synovitis and tenosynovitis, unspecified site: Secondary | ICD-10-CM | POA: Insufficient documentation

## 2014-01-13 MED ORDER — OXYCODONE HCL 5 MG PO TABS: 5.0000 mg | ORAL_TABLET | Freq: Four times a day (QID) | ORAL | Status: DC | PRN

## 2014-01-13 MED ORDER — MELOXICAM 15 MG PO TABS: 15.0000 mg | ORAL_TABLET | Freq: Every day | ORAL | Status: DC

## 2014-01-13 NOTE — Progress Notes (Signed)
Subjective:    Patient ID: Debbie [name], female    DOB: 10-22-1976, 38 y.o.   MRN: [id number]  HPI  Debbie [name] is back regarding her spinal cord injury. She had some results with the botox but didn't make it to therapy because she developed a pneumonia and was hospitalized. She received a few therapies before she got sick but also before the botox too effect. Nonetheless, she feels that her legs have not been spasming as much, and she can hold her feet flatter in her chair.   She is just now feeling better after her respiratory issues and a UTI.  She needs a letter for voc rehab so that she can get coverage for her therapies.      Pain Inventory Average Pain 4 Pain Right Now 4 My pain is burning, tingling and aching  In the last 24 hours, has pain interfered with the following? General activity 4 Relation with others 0 Enjoyment of life 2 What TIME of day is your pain at its worst? all day Sleep (in general) Fair  Pain is worse with: sitting and some activites Pain improves with: therapy/exercise Relief from Meds: 8  Mobility ability to climb steps?  no do you drive?  no use a wheelchair needs help with transfers transfers alone Do you have any goals in this area?  yes  Function disabled: date disabled na I need assistance with the following:  bathing and toileting Do you have any goals in this area?  yes  Neuro/Psych bladder control problems bowel control problems weakness numbness tingling spasms  Prior Studies Any changes since last visit?  yes x-rays CT/MRI  Physicians involved in your care Any changes since last visit?  yes Orthopedist Dr. Trenton Gammon   Family History  Problem Relation Age of Onset  . Multiple sclerosis Father   . Diabetes Mother    History   Social History  . Marital Status: Legally Separated    Spouse Name: N/A    Number of Children: N/A  . Years of Education: N/A   Social History Main Topics  . Smoking status: Former  Smoker -- 1.50 packs/day for 10 years    Types: Cigarettes    Quit date: 11/07/2012  . Smokeless tobacco: None  . Alcohol Use: Yes     Comment: occ  . Drug Use: No  . Sexual Activity: Yes    Birth Control/ Protection: None   Other Topics Concern  . None   Social History Narrative  . None   Past Surgical History  Procedure Laterality Date  . Fracture surgery    . Cesarean section    . 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 Spine surgery     Past Medical History  Diagnosis Date  . Hypertension   . Spastic paraplegia   . Neurogenic bladder     and bowel  . Mass     T5-T6 intradural mass  . Chronic pain   . Chronic myofascial pain    BP 93/61  Pulse 65  Resp 14  Ht 5\' 8"  (1.727 m)  Wt 211 lb (95.709 kg)  BMI 32.09 kg/m2  SpO2 100%  Opioid Risk Score: 5 Fall Risk Score: Low Fall Risk (0-5 points) (pt educated on fall risk, offered brochure)   Review of Systems  Constitutional:  Positive for fever and chills.  Respiratory: Positive for apnea.   Cardiovascular: Positive for leg swelling.  Endocrine:       High blood sugar   Genitourinary:       Bowel and bladder control problems   Neurological: Positive for weakness and numbness.       Tingling, spasms  All other systems reviewed and are negative.       Objective:   Physical Exam General: Alert and oriented x 3, No apparent distress. Obese, looks good today. HEENT: Head is normocephalic, atraumatic, PERRLA, EOMI, sclera anicteric, oral mucosa pink and moist, dentition intact, ext ear canals clear,  Neck: Supple without JVD or lymphadenopathy  Heart: Reg rate and rhythm. No murmurs rubs or gallops  Chest: CTA bilaterally without wheezes, rales, or rhonchi; no distress  Abdomen: Soft, non-tender, non-distended, bowel  sounds positive.  Extremities: No clubbing, cyanosis, or edema. Pulses are 2+  Skin: Clean and intact without signs of breakdown  Neuro: Pt is cognitively appropriate with normal insight, memory, and awareness. Cranial nerves 2-12 are intact. Sensory exam is normal. Reflexes are 3+ in lower extremities. Fine motor coordination is intact in UE's. No tremors. Motor function is 1/5 left HF, hip rotators , 3/5 on right3/5 with right KE, 1 with left KE and KF. She had 2+ to 3/5 toe/ankle movement on the right and absent/trace on left. She has gross pain sensation on the right leg, minimal sensation on the left. She displayed continue trace  tone at HF and KF, 1- 2 tone at PF and invertors of either ankle. Right more than left. 1-2 beats of clonus was seen at the right greater than left ankles, typically triggered by passive movement of legs. i was able to break some of her tone by flexing her knees.  Musculoskeletal: Full ROM, No pain with AROM or PROM in the neck, trunk. Good sitting posture. Minimal pain in the intrascapular spinal region.   . The scapula doesn't appear misplaced. Posture fair to good.  Psych: Pt's affect is appropriate. Pt is cooperative. She seems to be in good spirits again today.  Assessment & Plan:   1. 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  2. Neurogenic bowel and bladder.  3. Myofascial pain  4. Spastic paraplegia  5. Left elbow pain, tendonitis--left brachioradialis--appears improved 6. 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    Plan:  1.continue tizanidine to 6mg  qid along with the valium and gabapentin.  2. Continue gabapentin. Resume meloxicam which was helping with thoracic pain. I see no contraindication to continuing it. 3. Will arrange for second round of botox to bilateral gastroc, soleus  Prior to her resuming therapies.  4. Oxycodone for breakthrough pain was refilled #90.  5. Resume outpt therapies after botox.   6. ?baclofen pump still a  consideration, but she had good results with botox, so hopefully we can avoid. 7. I will see her back in about a month. 30 minutes of face to face patient care time were spent during this visit. All questions were encouraged and answered.

## 2014-01-13 NOTE — Patient Instructions (Signed)
CONTINUE TO WORK ON YOUR RANGE OF MOTION.  PLEASE CALL ME WITH ANY PROBLEMS OR QUESTIONS (#[phone]).

## 2014-02-25 ENCOUNTER — Telehealth: Payer: Self-pay

## 2014-02-25 ENCOUNTER — Encounter: Payer: Medicaid Other | Admitting: Physical Medicine & Rehabilitation

## 2014-02-25 MED ORDER — DIAZEPAM 5 MG PO TABS: 5.0000 mg | ORAL_TABLET | Freq: Three times a day (TID) | ORAL | Status: DC | PRN

## 2014-02-25 NOTE — Telephone Encounter (Signed)
Contacted patient to inform her that the Valium RX was called in at pharmacy and per Dr. Naaman Plummer he will not continue to refill meds if no-shows are persistent. Transferred to front desk to make a F/U appt.

## 2014-02-25 NOTE — Telephone Encounter (Signed)
Patient called requesting diazepam 5mg  refill.

## 2014-02-25 NOTE — Telephone Encounter (Signed)
Patient was a no show on 3/25. States she thought her appt was on 3/26. She is requesting a refill on Diazepam. Please advise.

## 2014-02-25 NOTE — Telephone Encounter (Signed)
Wrote "phone in" order for valium.  Also, i have discussed with Debbie [name] her frequent no-shows----i won't continue to fill meds if the no-shows are persistent

## 2014-02-25 NOTE — Telephone Encounter (Signed)
Patient wanted to make Dr Naaman Plummer aware that her tumor has returned.  They are not going to treat at this time just monitor it.  Appointments have been made for patient and she is aware she needs to keep them.

## 2014-03-05 ENCOUNTER — Telehealth: Payer: Self-pay | Admitting: Physical Medicine & Rehabilitation

## 2014-03-05 NOTE — Telephone Encounter (Signed)
Patient has follow up appointment 4/15.  Will be out of HCTZ before then, and says Dr. Naaman Plummer prescribes that for her.  Requesting a refill to be called into pharmacy.

## 2014-03-05 NOTE — Telephone Encounter (Signed)
Left message to return call to office. Dr Naaman Plummer has not prescribed the hydrochlorothiazide since July 2014 with 5 refills that should have expired by 11/2013.  She needs to get this medication from her PCP.

## 2014-03-05 NOTE — Telephone Encounter (Signed)
[name] called back and I informed her she needs to get that med from her PCP. She says Dr Debbie [name] rx recently because her bottle was from February but epic records done reflect that prescription.  I explained why we do not prescribe BP/fluid medication from this office because of a need to follow electrolyte lab work which we do not do, therefore she should call PCP.

## 2014-03-18 ENCOUNTER — Encounter: Payer: Medicaid Other | Attending: Physical Medicine & Rehabilitation | Admitting: Registered Nurse

## 2014-03-18 ENCOUNTER — Encounter: Payer: Self-pay | Admitting: Registered Nurse

## 2014-03-18 VITALS — BP 99/52 | HR 56 | Resp 14 | Ht 68.0 in

## 2014-03-18 DIAGNOSIS — IMO0002 Reserved for concepts with insufficient information to code with codable children: Secondary | ICD-10-CM

## 2014-03-18 DIAGNOSIS — Z5181 Encounter for therapeutic drug level monitoring: Secondary | ICD-10-CM

## 2014-03-18 DIAGNOSIS — IMO0001 Reserved for inherently not codable concepts without codable children: Secondary | ICD-10-CM | POA: Insufficient documentation

## 2014-03-18 DIAGNOSIS — G8220 Paraplegia, unspecified: Secondary | ICD-10-CM | POA: Insufficient documentation

## 2014-03-18 DIAGNOSIS — G9520 Unspecified cord compression: Secondary | ICD-10-CM

## 2014-03-18 DIAGNOSIS — K592 Neurogenic bowel, not elsewhere classified: Secondary | ICD-10-CM

## 2014-03-18 DIAGNOSIS — G959 Disease of spinal cord, unspecified: Secondary | ICD-10-CM | POA: Insufficient documentation

## 2014-03-18 DIAGNOSIS — N319 Neuromuscular dysfunction of bladder, unspecified: Secondary | ICD-10-CM

## 2014-03-18 DIAGNOSIS — Z79899 Other long term (current) drug therapy: Secondary | ICD-10-CM

## 2014-03-18 MED ORDER — OXYCODONE HCL 5 MG PO TABS: 5.0000 mg | ORAL_TABLET | Freq: Four times a day (QID) | ORAL | Status: DC | PRN

## 2014-03-18 NOTE — Progress Notes (Signed)
Subjective:    Patient ID: Debbie [name], female    DOB: 06-Jul-1976, 38 y.o.   MRN: [id number]  HPI: Ms. Debbie [name] is a 38 year old female who returns for follow up for chronic pain and medication refill. She says her pain is located middle back and lately she has noticed left ankle pain. She admits to tingling from left hip to knee. She rates her pain 4. She's performing daily exercise routine for exercise regime. She has been unable to receive physical therapy due to insurance. Brochure was given regarding exercise in the community. She's wheelchair bound, she states : she is unable to walk.  She states: Dr. 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 He doesn't recommend surgery he will continue to assess.  Pain Inventory Average Pain 4 Pain Right Now 4 My pain is sharp, stabbing, tingling and aching  In the last 24 hours, has pain interfered with the following? General activity 4 Relation with others 0 Enjoyment of life 4 What TIME of day is your pain at its worst? all Sleep (in general) Poor  Pain is worse with: some activites Pain improves with: heat/ice, therapy/exercise and medication Relief from Meds: 8  Mobility use a wheelchair needs help with transfers  Function disabled: date disabled . I need assistance with the following:  bathing, toileting, meal prep, household duties and shopping  Neuro/Psych bladder control problems bowel control problems numbness tingling spasms dizziness anxiety  Prior Studies Any changes since last visit?  yes  Tumor has returned  Physicians involved in your care Any changes since last visit?  no   Family History  Problem Relation Age of Onset  . Multiple sclerosis Father   . Diabetes Mother    History   Social History  . Marital Status: Legally Separated    Spouse Name: N/A    Number of Children: N/A  . Years of Education: N/A   Social History Main Topics  . Smoking status: Former Smoker -- 1.50  packs/day for 10 years    Types: Cigarettes    Quit date: 11/07/2012  . Smokeless tobacco: None  . Alcohol Use: Yes     Comment: occ  . Drug Use: No  . Sexual Activity: Yes    Birth Control/ Protection: None   Other Topics Concern  . None   Social History Narrative  . None   Past Surgical History  Procedure Laterality Date  . Fracture surgery    . Cesarean section    . 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 Spine surgery     Past Medical History  Diagnosis Date  . Hypertension   . Spastic paraplegia   . Neurogenic bladder     and bowel  . Mass     T5-T6 intradural mass  . Chronic pain   . Chronic myofascial pain    BP 99/52  Pulse 56  Resp 14  Ht 5\' 8"  (1.727 m)  SpO2 98%  Opioid Risk Score:   Fall Risk Score: Low Fall Risk (0-5 points)  Review of Systems  Constitutional: Positive for chills.  Gastrointestinal:       Bowel Control Problems  Genitourinary:       Bladder control problems  Musculoskeletal:       Spasms  Neurological: Positive  for dizziness and numbness.       Tingling  Psychiatric/Behavioral: The patient is nervous/anxious.   All other systems reviewed and are negative.      Objective:   Physical Exam  Nursing note and vitals reviewed. Constitutional: She is oriented to person, place, and time. She appears well-developed and well-nourished.  HENT:  Head: Normocephalic.  Neck: Normal range of motion.  Cardiovascular: Normal rate, regular rhythm and normal heart sounds.   Pulmonary/Chest: Effort normal and breath sounds normal.  At times she has experience some pleuritic pain during the night. Denies at this time.   Musculoskeletal:  Normal Muscle Bulk: Muscle Testing Reveals: Bilateral Hand Grips: 5/5 Upper extremities with Full ROM WNL Lower  Extremities: Left leg flexion spasm noted. Right Lower leg: WNL Thoracic Paraspinals muscles tenderness  Neurological: She is alert and oriented to person, place, and time. She has normal reflexes.  Skin: Skin is warm and dry.  Psychiatric: She has a normal mood and affect.          Assessment & Plan:  1. 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 Continue Current Medication Regime. Valium, Neurontin and tiZanidine. 2. Neurogenic bowel and bladder. Continue to Monitor 3. Myofascial pain : Dr. Naaman Plummer for Botox with next appointme 4. Spastic paraplegia . Refilled: oxyCODONE 5mg  one tablet every 6 hours as needed #90 5. Neuropathic pain: Continue Gabapentin   30 minutes of face to face patient care time were spent during this visit. All questions were encouraged and answered.

## 2014-04-06 IMAGING — CR DG LUMBAR SPINE COMPLETE 4+V
5 series · 5 of 5 positions shown · non-contrast
Comparison: None.

CLINICAL DATA: 36-year-old female with low back pain times 3 weeks.

LUMBAR SPINE - COMPLETE 4+ VIEW

[view not recorded (1 of 5)]
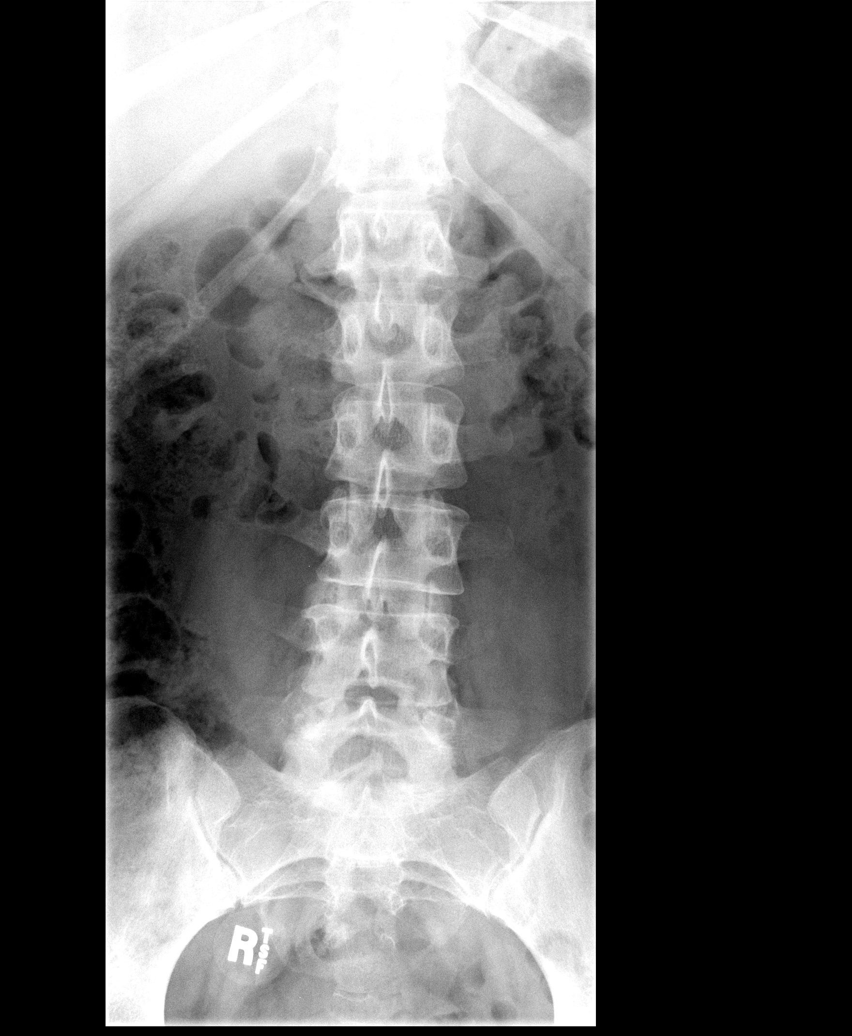

[view not recorded (2 of 5)]
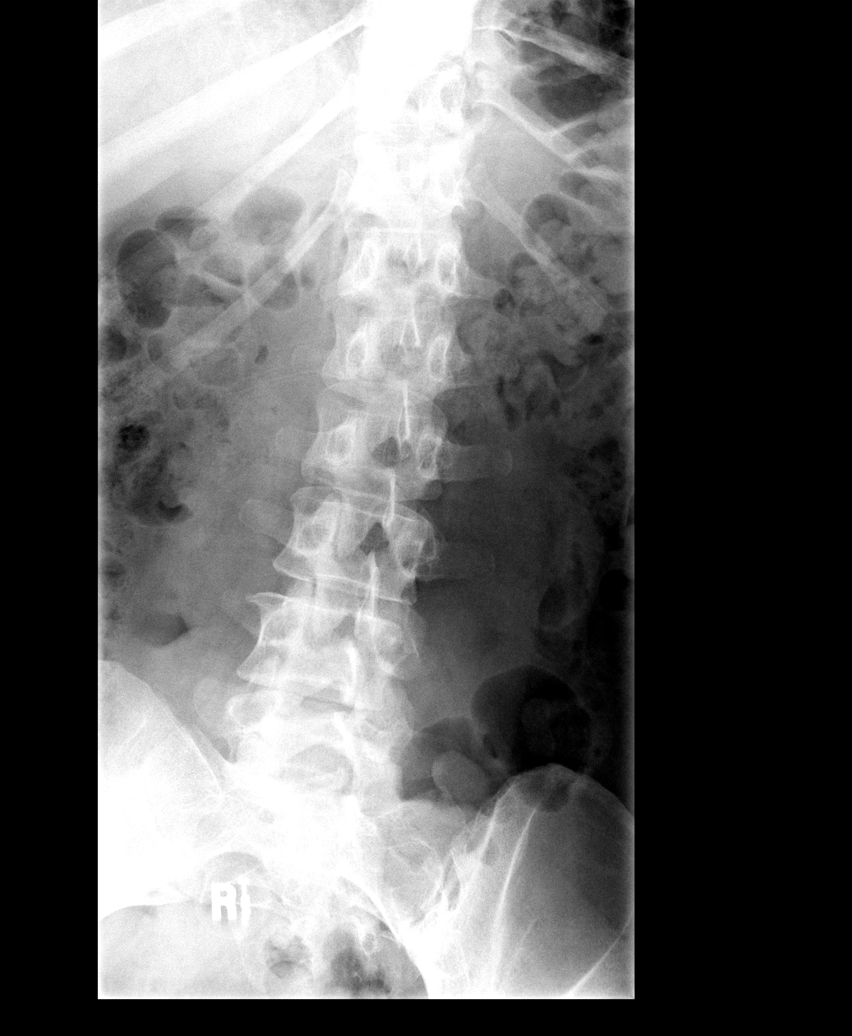

[view not recorded (3 of 5)]
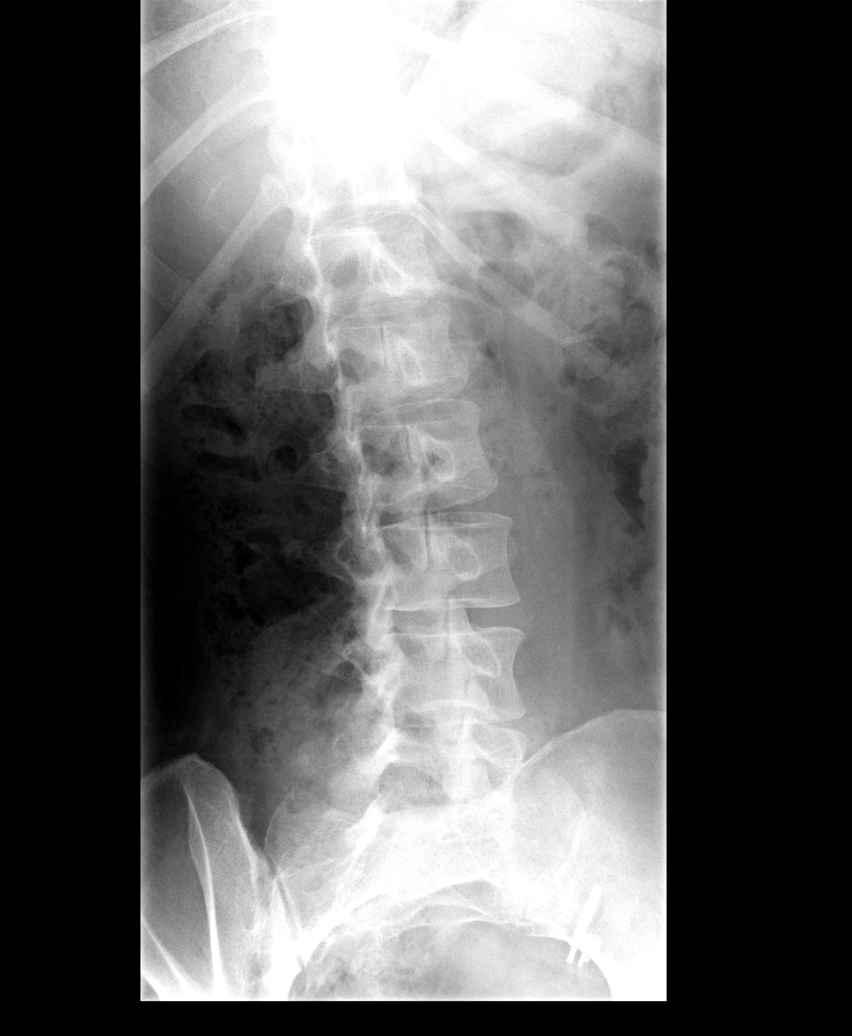

[view not recorded (4 of 5)]
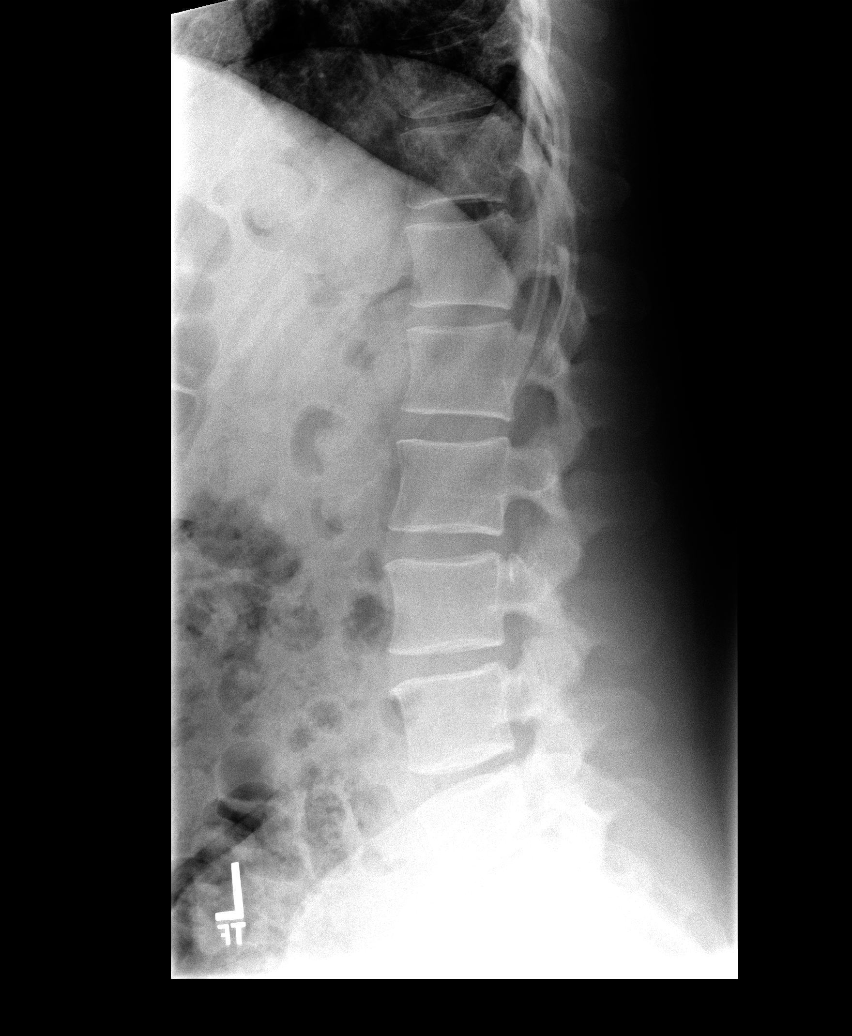

[view not recorded (5 of 5)]
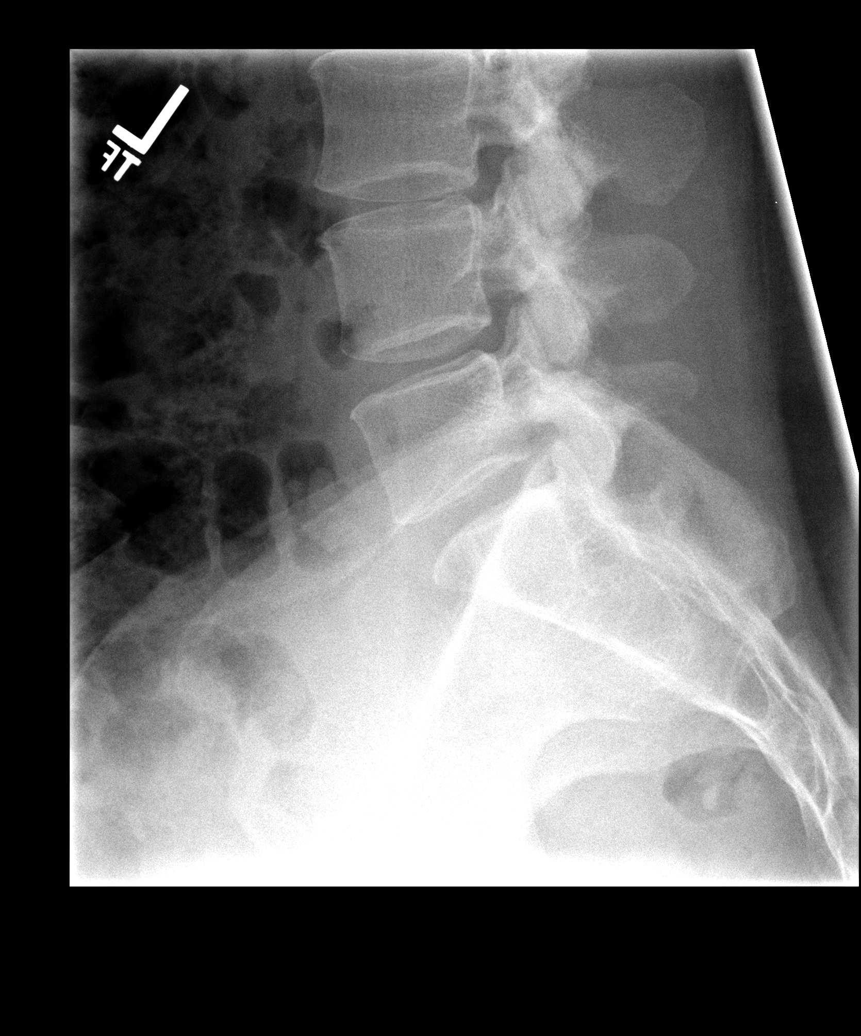

[5 of 5 positions shown; findings below may reference images not displayed]

FINDINGS: For the purposes of this report, the lowest full size
ribs will be designated at T11, with absent ribs at T12.

Bone mineralization is within normal limits.  Normal vertebral
height and alignment.  Relatively preserved disc spaces.  No pars
fracture.  Sacral ala and SI joints within normal limits.
IMPRESSION: Transitional anatomy, otherwise negative radiographic appearance of
the lumbar spine.

## 2014-04-22 ENCOUNTER — Encounter: Payer: Medicaid Other | Attending: Physical Medicine & Rehabilitation | Admitting: Physical Medicine & Rehabilitation

## 2014-04-22 ENCOUNTER — Ambulatory Visit: Payer: Medicaid Other | Admitting: Physical Medicine & Rehabilitation

## 2014-04-22 ENCOUNTER — Encounter: Payer: Self-pay | Admitting: Physical Medicine & Rehabilitation

## 2014-04-22 VITALS — BP 107/63 | HR 61 | Resp 14 | Ht 68.5 in

## 2014-04-22 DIAGNOSIS — IMO0002 Reserved for concepts with insufficient information to code with codable children: Secondary | ICD-10-CM

## 2014-04-22 DIAGNOSIS — G9520 Unspecified cord compression: Secondary | ICD-10-CM

## 2014-04-22 DIAGNOSIS — IMO0001 Reserved for inherently not codable concepts without codable children: Secondary | ICD-10-CM | POA: Insufficient documentation

## 2014-04-22 DIAGNOSIS — G959 Disease of spinal cord, unspecified: Secondary | ICD-10-CM | POA: Insufficient documentation

## 2014-04-22 DIAGNOSIS — G8220 Paraplegia, unspecified: Secondary | ICD-10-CM | POA: Insufficient documentation

## 2014-04-22 MED ORDER — DIAZEPAM 5 MG PO TABS: 5.0000 mg | ORAL_TABLET | Freq: Three times a day (TID) | ORAL | Status: DC | PRN

## 2014-04-22 MED ORDER — GABAPENTIN 300 MG PO CAPS: 600.0000 mg | ORAL_CAPSULE | Freq: Three times a day (TID) | ORAL | Status: DC

## 2014-04-22 MED ORDER — OXYCODONE HCL 5 MG PO TABS
5.0000 mg | ORAL_TABLET | Freq: Four times a day (QID) | ORAL | Status: DC | PRN
Start: 2014-04-22 — End: 2014-06-22

## 2014-04-22 NOTE — Progress Notes (Signed)
Botox Injection for spasticity using needle EMG guidance Indication: spastic paraplegia   Dilution: 100 Units/ml        Total Units Injected: 400u bilateral LE's Indication: Severe spasticity which interferes with ADL,mobility and/or  hygiene and is unresponsive to medication management and other conservative care Informed consent was obtained after describing risks and benefits of the procedure with the patient. This includes bleeding, bruising, infection, excessive weakness, or medication side effects. A REMS form is on file and signed.  Needle: 14mm injectable monopolar needle electrode  Number of units per muscle  Gastroc/soleus 200 units 4 access points right and left sides.---total of 400u   All injections were done after obtaining appropriate EMG activity and after negative drawback for blood. The patient tolerated the procedure well. Post procedure instructions were given. A followup appointment was made.

## 2014-04-22 NOTE — Patient Instructions (Signed)
PLEASE CALL ME WITH ANY PROBLEMS OR QUESTIONS (#[phone]).      

## 2014-06-02 ENCOUNTER — Other Ambulatory Visit: Payer: Self-pay

## 2014-06-02 DIAGNOSIS — IMO0002 Reserved for concepts with insufficient information to code with codable children: Secondary | ICD-10-CM

## 2014-06-02 DIAGNOSIS — IMO0001 Reserved for inherently not codable concepts without codable children: Secondary | ICD-10-CM

## 2014-06-02 DIAGNOSIS — G959 Disease of spinal cord, unspecified: Secondary | ICD-10-CM

## 2014-06-02 MED ORDER — TIZANIDINE HCL 4 MG PO TABS
6.0000 mg | ORAL_TABLET | Freq: Four times a day (QID) | ORAL | Status: DC
Start: 2014-06-02 — End: 2014-12-11

## 2014-06-02 NOTE — Telephone Encounter (Signed)
Patient is requesting a refill on Tizanidine. Refill sent to pharmacy. Patient is aware.

## 2014-06-22 ENCOUNTER — Encounter: Payer: Self-pay | Admitting: Physical Medicine & Rehabilitation

## 2014-06-22 ENCOUNTER — Encounter: Payer: Medicaid Other | Attending: Physical Medicine & Rehabilitation | Admitting: Physical Medicine & Rehabilitation

## 2014-06-22 VITALS — BP 124/66 | HR 87 | Resp 14

## 2014-06-22 DIAGNOSIS — Z79899 Other long term (current) drug therapy: Secondary | ICD-10-CM

## 2014-06-22 DIAGNOSIS — Z5181 Encounter for therapeutic drug level monitoring: Secondary | ICD-10-CM

## 2014-06-22 DIAGNOSIS — G959 Disease of spinal cord, unspecified: Secondary | ICD-10-CM | POA: Diagnosis present

## 2014-06-22 DIAGNOSIS — G8220 Paraplegia, unspecified: Secondary | ICD-10-CM

## 2014-06-22 DIAGNOSIS — IMO0002 Reserved for concepts with insufficient information to code with codable children: Secondary | ICD-10-CM

## 2014-06-22 DIAGNOSIS — G9520 Unspecified cord compression: Secondary | ICD-10-CM

## 2014-06-22 DIAGNOSIS — IMO0001 Reserved for inherently not codable concepts without codable children: Secondary | ICD-10-CM

## 2014-06-22 MED ORDER — AMITRIPTYLINE HCL 25 MG PO TABS: 25.0000 mg | ORAL_TABLET | Freq: Every day | ORAL | Status: DC

## 2014-06-22 MED ORDER — OXYCODONE HCL 5 MG PO TABS: 5.0000 mg | ORAL_TABLET | Freq: Four times a day (QID) | ORAL | Status: DC | PRN

## 2014-06-22 NOTE — Progress Notes (Signed)
Subjective:    Patient ID: Debbie [name], female    DOB: 03-11-1976, 38 y.o.   MRN: [id number]  HPI  Debbie [name] is back regarding her spastic tetraplegia.  She had good results with the botox injections. Her calves are looser. She has had less spasms as a whole but still experiences them at night when there are longer gaps between her meds.   She is planning on getting back to PT when [facility] approves visits. She needs further work on LE ROM, gait, strength, etc.   She remains on her tizanidine and valium for spasticity control.  She is essentially continent of bladder but has substantial urgency still.   Her oxycodone use has decreased to essentially one per day--she has noticed rebound headaches after using the oxycodone.  Pain Inventory Average Pain 3 Pain Right Now 3 My pain is sharp, dull, stabbing and aching  In the last 24 hours, has pain interfered with the following? General activity 4 Relation with others 4 Enjoyment of life 2 What TIME of day is your pain at its worst? morning and night Sleep (in general) Poor  Pain is worse with: bending and sitting Pain improves with: medication Relief from Meds: 8  Mobility use a wheelchair  Function disabled: date disabled . I need assistance with the following:  bathing and household duties  Neuro/Psych bladder control problems bowel control problems spasms depression  Prior Studies Any changes since last visit?  no  Physicians involved in your care Any changes since last visit?  no   Family History  Problem Relation Age of Onset  . Multiple sclerosis Father   . Diabetes Mother    History   Social History  . Marital Status: Legally Separated    Spouse Name: N/A    Number of Children: N/A  . Years of Education: N/A   Social History Main Topics  . Smoking status: Former Smoker -- 1.50 packs/day for 10 years    Types: Cigarettes    Quit date: 11/07/2012  . Smokeless tobacco: None  . Alcohol Use: Yes   Comment: occ  . Drug Use: No  . Sexual Activity: Yes    Birth Control/ Protection: None   Other Topics Concern  . None   Social History Narrative  . None   Past Surgical History  Procedure Laterality Date  . Fracture surgery    . Cesarean section    . 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 Spine surgery     Past Medical History  Diagnosis Date  . Hypertension   . Spastic paraplegia   . Neurogenic bladder     and bowel  . Mass     T5-T6 intradural mass  . Chronic pain   . Chronic myofascial pain    BP 124/66  Pulse 87  Resp 14  SpO2 98%  Opioid Risk Score:   Fall Risk Score: Low Fall Risk (0-5 points) (previously educated )  Review of Systems  Constitutional: Positive for chills.  Cardiovascular: Positive for leg swelling.  Gastrointestinal:       Bowel Control Problems  Endocrine:       High blood sugar, started on metformin  Genitourinary:       Bladder control problems  Musculoskeletal:       Spasms  Psychiatric/Behavioral: Positive for  dysphoric mood.  All other systems reviewed and are negative.      Objective:   Physical Exam  Nursing note and vitals reviewed.  Constitutional: She is oriented to person, place, and time. She appears well-developed and well-nourished.  HENT:  Head: Normocephalic.  Neck: Normal range of motion.  Cardiovascular: Normal rate, regular rhythm and normal heart sounds.  Pulmonary/Chest: Effort normal and breath sounds normal.  At times she has experience some pleuritic pain during the night. Denies at this time.  Musculoskeletal:  Normal Muscle Bulk: Muscle Testing Reveals: Bilateral Hand Grips: 5/5 Upper extremities with Full ROM WNL Lower Extremities: Left leg flexion spasm noted. Hip adductors 2/4. Left hamstring 2/4. gactrocs  1-2/4 bilaterally, let more than right. Strength underlying is 2-3/5 bilateral legs.  Thoracic Paraspinals muscles tenderness  Neurological: She is alert and oriented to person, place, and time. She has normal reflexes.  Skin: Skin is warm and dry.  Psychiatric: She has a normal mood and affect.   Assessment & Plan:   1. 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 Continue Current Medication Regime. Valium, Neurontin and tiZanidine.   -will set up for another round of botox to coincide with therapy (approx 2 months)   -400 u to hip adductors, hamstrings, gastrocs potentially  -outpt PT 2. Neurogenic bowel and bladder: will add amitriptyline hs to help with frequency and nerve pain. 25mg  qhs 3. Myofascial pain:   4. Pain . Refilled: oxyCODONE 5mg  one tablet every 6 hours as needed #90  5. Neuropathic pain: Continue Gabapentin. Elavil added

## 2014-06-22 NOTE — Patient Instructions (Signed)
PLEASE CALL ME WITH ANY PROBLEMS OR QUESTIONS (#[phone]).      

## 2014-07-06 ENCOUNTER — Telehealth: Payer: Self-pay | Admitting: *Deleted

## 2014-07-06 DIAGNOSIS — G9520 Unspecified cord compression: Secondary | ICD-10-CM

## 2014-07-06 DIAGNOSIS — IMO0002 Reserved for concepts with insufficient information to code with codable children: Secondary | ICD-10-CM

## 2014-07-06 MED ORDER — DIAZEPAM 5 MG PO TABS: 5.0000 mg | ORAL_TABLET | Freq: Three times a day (TID) | ORAL | Status: DC | PRN

## 2014-07-06 NOTE — Telephone Encounter (Signed)
Need refill on her diazepam.m  Called to pharmacy

## 2014-07-29 ENCOUNTER — Ambulatory Visit
Payer: Medicaid Other | Attending: Physical Medicine & Rehabilitation | Admitting: Rehabilitative and Restorative Service Providers"

## 2014-07-29 DIAGNOSIS — G8220 Paraplegia, unspecified: Secondary | ICD-10-CM | POA: Insufficient documentation

## 2014-07-29 DIAGNOSIS — IMO0001 Reserved for inherently not codable concepts without codable children: Secondary | ICD-10-CM | POA: Insufficient documentation

## 2014-07-29 DIAGNOSIS — M6281 Muscle weakness (generalized): Secondary | ICD-10-CM | POA: Insufficient documentation

## 2014-07-29 DIAGNOSIS — R269 Unspecified abnormalities of gait and mobility: Secondary | ICD-10-CM | POA: Diagnosis not present

## 2014-08-24 ENCOUNTER — Encounter: Payer: Medicaid Other | Admitting: Physical Medicine & Rehabilitation

## 2014-08-31 ENCOUNTER — Ambulatory Visit
Payer: PRIVATE HEALTH INSURANCE | Attending: Physical Medicine & Rehabilitation | Admitting: Rehabilitative and Restorative Service Providers"

## 2014-08-31 DIAGNOSIS — M6281 Muscle weakness (generalized): Secondary | ICD-10-CM | POA: Diagnosis not present

## 2014-08-31 DIAGNOSIS — IMO0001 Reserved for inherently not codable concepts without codable children: Secondary | ICD-10-CM | POA: Insufficient documentation

## 2014-08-31 DIAGNOSIS — R269 Unspecified abnormalities of gait and mobility: Secondary | ICD-10-CM | POA: Insufficient documentation

## 2014-08-31 DIAGNOSIS — G8220 Paraplegia, unspecified: Secondary | ICD-10-CM | POA: Diagnosis not present

## 2014-09-04 ENCOUNTER — Ambulatory Visit: Payer: Medicaid Other | Admitting: Physical Therapy

## 2014-09-07 ENCOUNTER — Ambulatory Visit: Payer: Medicaid Other | Admitting: Rehabilitative and Restorative Service Providers"

## 2014-09-11 ENCOUNTER — Ambulatory Visit: Payer: PRIVATE HEALTH INSURANCE | Attending: Physical Medicine & Rehabilitation

## 2014-09-11 DIAGNOSIS — R269 Unspecified abnormalities of gait and mobility: Secondary | ICD-10-CM | POA: Diagnosis not present

## 2014-09-11 DIAGNOSIS — G9520 Unspecified cord compression: Secondary | ICD-10-CM | POA: Diagnosis present

## 2014-09-11 DIAGNOSIS — M6281 Muscle weakness (generalized): Secondary | ICD-10-CM | POA: Insufficient documentation

## 2014-09-11 DIAGNOSIS — G8220 Paraplegia, unspecified: Secondary | ICD-10-CM | POA: Diagnosis present

## 2014-09-15 ENCOUNTER — Ambulatory Visit: Payer: PRIVATE HEALTH INSURANCE | Admitting: Physical Therapy

## 2014-09-15 DIAGNOSIS — G8220 Paraplegia, unspecified: Secondary | ICD-10-CM | POA: Diagnosis not present

## 2014-09-18 ENCOUNTER — Telehealth: Payer: Self-pay | Admitting: Physical Medicine & Rehabilitation

## 2014-09-18 DIAGNOSIS — G9520 Unspecified cord compression: Secondary | ICD-10-CM

## 2014-09-18 DIAGNOSIS — IMO0002 Reserved for concepts with insufficient information to code with codable children: Secondary | ICD-10-CM

## 2014-09-18 MED ORDER — DIAZEPAM 5 MG PO TABS: 5.0000 mg | ORAL_TABLET | Freq: Three times a day (TID) | ORAL | Status: DC | PRN

## 2014-09-18 NOTE — Telephone Encounter (Signed)
Needs a refill on Diazapam

## 2014-09-18 NOTE — Telephone Encounter (Signed)
Notified of med refill

## 2014-09-21 ENCOUNTER — Ambulatory Visit: Payer: Medicaid Other | Admitting: Rehabilitative and Restorative Service Providers"

## 2014-09-23 ENCOUNTER — Telehealth: Payer: Self-pay | Admitting: *Deleted

## 2014-09-23 DIAGNOSIS — IMO0002 Reserved for concepts with insufficient information to code with codable children: Secondary | ICD-10-CM

## 2014-09-23 NOTE — Telephone Encounter (Signed)
Patient needs refill on Gabapentin. 

## 2014-09-25 ENCOUNTER — Ambulatory Visit: Payer: Medicaid Other | Admitting: Rehabilitative and Restorative Service Providers"

## 2014-09-25 MED ORDER — GABAPENTIN 300 MG PO CAPS: 600.0000 mg | ORAL_CAPSULE | Freq: Three times a day (TID) | ORAL | Status: DC

## 2014-09-25 NOTE — Telephone Encounter (Signed)
Refilled gabapentin

## 2014-09-28 ENCOUNTER — Ambulatory Visit: Payer: Medicaid Other | Admitting: Rehabilitative and Restorative Service Providers"

## 2014-09-30 ENCOUNTER — Encounter: Payer: Medicaid Other | Attending: Physical Medicine & Rehabilitation | Admitting: Physical Medicine & Rehabilitation

## 2014-09-30 ENCOUNTER — Encounter: Payer: Self-pay | Admitting: Physical Medicine & Rehabilitation

## 2014-09-30 ENCOUNTER — Ambulatory Visit: Payer: PRIVATE HEALTH INSURANCE | Admitting: Physical Therapy

## 2014-09-30 VITALS — BP 158/92 | HR 60 | Resp 14

## 2014-09-30 DIAGNOSIS — G8220 Paraplegia, unspecified: Secondary | ICD-10-CM

## 2014-09-30 DIAGNOSIS — G8389 Other specified paralytic syndromes: Secondary | ICD-10-CM | POA: Diagnosis present

## 2014-09-30 DIAGNOSIS — G9520 Unspecified cord compression: Secondary | ICD-10-CM

## 2014-09-30 DIAGNOSIS — M791 Myalgia: Secondary | ICD-10-CM

## 2014-09-30 DIAGNOSIS — IMO0002 Reserved for concepts with insufficient information to code with codable children: Secondary | ICD-10-CM

## 2014-09-30 DIAGNOSIS — M609 Myositis, unspecified: Secondary | ICD-10-CM

## 2014-09-30 DIAGNOSIS — IMO0001 Reserved for inherently not codable concepts without codable children: Secondary | ICD-10-CM

## 2014-09-30 MED ORDER — OXYCODONE HCL 5 MG PO TABS: 5.0000 mg | ORAL_TABLET | Freq: Four times a day (QID) | ORAL | Status: DC | PRN

## 2014-09-30 NOTE — Progress Notes (Signed)
Botox Injection for spasticity using needle EMG guidance Indication: spastic paraplegia  Dilution: 100 Units/ml        Total Units Injected: 400u Indication: Severe spasticity which interferes with ADL,mobility and/or  hygiene and is unresponsive to medication management and other conservative care Informed consent was obtained after describing risks and benefits of the procedure with the patient. This includes bleeding, bruising, infection, excessive weakness, or medication side effects. A REMS form is on file and signed.  Needle: 30mm injectable monopolar needle electrode  Number of units per muscle  Quadriceps 0 units Gastroc/soleus 150 units left Gastroc/soleus 150 units right Hamstrings 0 units Tibialis Posterior 50 units left Tibialis Posterior 50 units left EHL 0 units   All injections were done after obtaining appropriate EMG activity and after negative drawback for blood. The patient tolerated the procedure well. Post procedure instructions were given. A followup appointment was made.   Oxycodone #90 was refilled today.  rx written for KAFO adjustments  Follow up in 3 months.

## 2014-09-30 NOTE — Patient Instructions (Signed)
PLEASE CALL ME WITH ANY PROBLEMS OR QUESTIONS (#[phone]).      

## 2014-09-30 NOTE — Progress Notes (Deleted)
   Subjective:    Patient ID: Debbie [name], female    DOB: [birth year], 38 y.o.   MRN: [id number]  HPI  Pain Inventory Average Pain 4 Pain Right Now 4 My pain is constant, sharp, stabbing and aching  In the last 24 hours, has pain interfered with the following? General activity 4 Relation with others 4 Enjoyment of life 0 What TIME of day is your pain at its worst? morning, evening and night Sleep (in general) Poor  Pain is worse with: some activites Pain improves with: rest and medication Relief from Meds: 5  Mobility ability to climb steps?  no do you drive?  no use a wheelchair needs help with transfers  Function disabled: date disabled .  Neuro/Psych bladder control problems bowel control problems weakness numbness spasms dizziness depression  Prior Studies Any changes since last visit?  no  Physicians involved in your care Any changes since last visit?  no   Family History  Problem Relation Age of Onset  . Multiple sclerosis Father   . Diabetes Mother    History   Social History  . Marital Status: Legally Separated    Spouse Name: N/A    Number of Children: N/A  . Years of Education: N/A   Social History Main Topics  . Smoking status: Former Smoker -- 1.50 packs/day for 10 years    Types: Cigarettes    Quit date: 11/07/2012  . Smokeless tobacco: None  . Alcohol Use: Yes     Comment: occ  . Drug Use: No  . Sexual Activity: Yes    Birth Control/ Protection: None   Other Topics Concern  . None   Social History Narrative  . None   Past Surgical History  Procedure Laterality Date  . Fracture surgery    . Cesarean section    . 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 Spine surgery     Past Medical History  Diagnosis Date  . Hypertension   .  Spastic paraplegia   . Neurogenic bladder     and bowel  . Mass     T5-T6 intradural mass  . Chronic pain   . Chronic myofascial pain    BP 158/92  Pulse 60  Resp 14  SpO2 99%  Opioid Risk Score:   Fall Risk Score: Moderate Fall Risk (6-13 points)  Review of Systems     Objective:   Physical Exam        Assessment & Plan:

## 2014-10-05 ENCOUNTER — Encounter: Payer: Self-pay | Admitting: Physical Medicine & Rehabilitation

## 2014-10-07 ENCOUNTER — Ambulatory Visit: Payer: PRIVATE HEALTH INSURANCE | Admitting: Physical Therapy

## 2014-10-12 ENCOUNTER — Ambulatory Visit: Payer: PRIVATE HEALTH INSURANCE | Attending: Physical Medicine & Rehabilitation | Admitting: Physical Therapy

## 2014-10-12 ENCOUNTER — Encounter: Payer: Self-pay | Admitting: Physical Therapy

## 2014-10-12 DIAGNOSIS — R531 Weakness: Secondary | ICD-10-CM | POA: Insufficient documentation

## 2014-10-12 DIAGNOSIS — R269 Unspecified abnormalities of gait and mobility: Secondary | ICD-10-CM | POA: Insufficient documentation

## 2014-10-12 NOTE — Therapy (Signed)
Physical Therapy Treatment  Patient Details  Name: Debbie [name] MRN: [id number] Date of Birth: 1976-01-23  Encounter Date: 10/12/2014      PT End of Session - 10/12/14 1325    Visit Number 6   Number of Visits 12   Date for PT Re-Evaluation 10/31/14   PT Start Time 1230   PT Stop Time 1310   PT Time Calculation (min) 40 min   Equipment Utilized During Treatment Gait belt;Other (comment)  bil KAFO's   Activity Tolerance Patient tolerated treatment well;Treatment limited secondary to medical complications (Comment);Other (comment)  pt's BP too high to continue, pt advised to call MD      Past Medical History  Diagnosis Date  . Hypertension   . Spastic paraplegia   . Neurogenic bladder     and bowel  . Mass     T5-T6 intradural mass  . Chronic pain   . Chronic myofascial pain     Past Surgical History  Procedure Laterality Date  . Fracture surgery    . Cesarean section    . 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 Spine surgery      There were no vitals taken for this visit.  Visit Diagnosis:  Generalized weakness  Abnormality of gait  Supervision with increased time and cues to don bil. KAFO's before standing in parallel bars.        [facility] PT Treatment/Exercise - 10/12/14 1318    Transfers   Transfers Sit to Stand;Stand to Sit   Sit to Stand 3: Mod assist;With upper extremity assist;From chair/3-in-1   Sit to Stand Details (indicate cue type and reason) using parallel bars with both knees blocked. needed max assist/facilitaiton to lock out both KAFO's with standing.             Stand to Sit 2: Max assist;With upper extremity assist;To chair/3-in-1   Stand to Sit Details use of parallel bars to lower to w/c surface with total assist to unlock KAFO's.                  Static Standing Balance   Static Standing - Balance Support Bilateral upper extremity supported   Static Standing - Level of Assistance 4: Min assist;3: Mod assist   Static Standing - Comment/# of Minutes cues on posture and equal weight shift with standing.  able to stand for 2 minutes 17.09 seconds before needing to sit due to dizziness.      Self Care:  Pt's BP after standing was 184/115 rt UE with automated machine. Manual recheck was 160/110. Seated calm, rest x5-6 minutes. Recheck manually 164/110 rt UE, second person recheck was 188/122. Session stopped at this time due to elevated BP, pt advised to call MD. Pt verbalized understanding. Primary PT made aware of BP and session stopping.        PT Long Term Goals - 10/12/14 1329    PT LONG TERM GOAL #1   Title Verbalize understanding of: home safety with caregivers for standing.   Baseline Pt does not stand in home with KAFO's.   Time 2   Period Weeks   Status On-going   PT LONG TERM GOAL #2   Title Address w/c needs by f/u with w/c company.   Time 2   Period Weeks  Status On-going   PT LONG TERM GOAL #3   Title Maintain standing in parallel bars x6 minutes nonstop to demo improved standing tolerance with +1 assistance.   Time 2   Period Weeks   Status On-going          Plan - 10/12/14 1326    Clinical Impression Statement Pt making slow, steady progress. Continues to be motivated to do more.   Pt will benefit from skilled therapeutic intervention in order to improve on the following deficits Decreased endurance;Decreased balance;Decreased mobility;Decreased strength;Decreased range of motion;Decreased coordination;Impaired sensation;Decreased activity tolerance;Increased muscle spasms   Rehab Potential Good   PT Frequency 2x / week   PT Duration 6 weeks   PT Treatment/Interventions Therapeutic activities;Neuromuscular re-education;Manual techniques;Gait training;Therapeutic exercise;Patient/family  education;Wheelchair mobility training;ADLs/Self Care Home Management;Other (comment)  HEP, orthotic fitting/trng   PT Next Visit Plan continue toward LTG's: standing and gait in parallel bars, try RW standing from mat as pt's vitals allow   Consulted and Agree with Plan of Care Patient        Problem List Patient Active Problem List   Diagnosis Date Noted  . CAP (community acquired pneumonia) 11/26/2013  . Tendonitis of elbow, left 05/26/2013  . Neurogenic bladder 01/29/2013  . Neurogenic bowel 01/29/2013  . GERD (gastroesophageal reflux disease) 01/29/2013  . Myalgia and myositis, unspecified 12/24/2012  . Spastic paraplegia 12/24/2012  . Paraplegia 11/18/2012  . 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 Spinal cord compression 11/09/2012  . Lower extremity weakness 11/08/2012  . Hypokalemia 11/08/2012  . HTN (hypertension) 11/08/2012  . Myelopathy 11/08/2012  . Paraparesis of both lower limbs 11/08/2012        Willow Ora 10/12/2014, 1:40 PM

## 2014-10-14 ENCOUNTER — Ambulatory Visit: Payer: PRIVATE HEALTH INSURANCE | Admitting: Physical Therapy

## 2014-10-20 ENCOUNTER — Ambulatory Visit: Payer: PRIVATE HEALTH INSURANCE | Admitting: Physical Therapy

## 2014-10-21 ENCOUNTER — Other Ambulatory Visit: Payer: Self-pay | Admitting: Physical Medicine & Rehabilitation

## 2014-10-21 ENCOUNTER — Telehealth: Payer: Self-pay | Admitting: Physical Medicine & Rehabilitation

## 2014-10-21 DIAGNOSIS — IMO0002 Reserved for concepts with insufficient information to code with codable children: Secondary | ICD-10-CM

## 2014-10-22 ENCOUNTER — Ambulatory Visit: Payer: PRIVATE HEALTH INSURANCE | Admitting: Rehabilitative and Restorative Service Providers"

## 2014-10-28 ENCOUNTER — Ambulatory Visit: Payer: PRIVATE HEALTH INSURANCE | Admitting: Rehabilitative and Restorative Service Providers"

## 2014-10-28 ENCOUNTER — Encounter: Payer: Self-pay | Admitting: Rehabilitative and Restorative Service Providers"

## 2014-10-28 VITALS — BP 146/94

## 2014-10-28 DIAGNOSIS — R531 Weakness: Secondary | ICD-10-CM

## 2014-10-28 DIAGNOSIS — R269 Unspecified abnormalities of gait and mobility: Secondary | ICD-10-CM

## 2014-10-28 NOTE — Therapy (Signed)
Physical Therapy Treatment  Patient Details  Name: Debbie [name] MRN: [id number] Date of Birth: Jul 29, 1976  Encounter Date: 10/28/2014      PT End of Session - 10/28/14 1210    Visit Number 7   Number of Visits 12   Date for PT Re-Evaluation 10/31/14   Authorization Type Vocational Rehabilitation   Authorization Time Period 08/04/2014-11/03/2014   Authorization - Visit Number 7   Authorization - Number of Visits 12   PT Start Time 1110   PT Stop Time 1200   PT Time Calculation (min) 50 min   Equipment Utilized During Treatment Gait belt;Other (comment)  bilateral KAFOs.   Activity Tolerance Patient tolerated treatment well;Treatment limited secondary to medical complications (Comment)  BP elevated with activity, had to rest frequently   Behavior During Therapy [facility] for tasks assessed/performed      Past Medical History  Diagnosis Date  . Hypertension   . Spastic paraplegia   . Neurogenic bladder     and bowel  . Mass     T5-T6 intradural mass  . Chronic pain   . Chronic myofascial pain     Past Surgical History  Procedure Laterality Date  . Fracture surgery    . Cesarean section    . Laminectomy  11/11/2012    Procedure: THORACIC LAMINECTOMY FOR TUMOR;  Surgeon: Charlie Pitter, MD;  Location: [facility];  Service: Neurosurgery;  Laterality: N/A;  thracic laminectomy for intradural and intramedullary neoplasm  . Spine surgery      BP 146/94 mmHg  Visit Diagnosis:  Generalized weakness  Abnormality of gait      Subjective Assessment - 10/28/14 1111    Symptoms The patient has MD appointment this friday for BP.  The patient reports BP elevated earlier this week to 126/95 and an episode of it bottoming up 97/54.  Pt feels good today.   Currently in Pain? No/denies      Gait: In parallel bars x 8 feet with CGA with significant WB through UEs with lateral trunk leaning.  Patient is able to advance LEs on her own today with KAFOs. With RW x 8 steps with +2 assist  with full assist to manage KAFOs. BP=140/100. Rested and BP=158/102.  Recommended no further gait activities today.   THERAPEUTIC ACTIVITIES: Sit<>stand with + 1 assist in parallel bars with assist to lock KAFOs.  Standing x 4 minutes with postural activities of glut sets to encourage more upright posture.   BP after standing-140/101 in L UE automatic cuff. Rested x 3 minutes and rechecked BP=143/93.  SELF CARE/HOME MANAGEMENT: The patient and PT discussed goals for remaining 5 visits approved through voc rehab plan (PT to request date extension to 01/03/2015). The patient has been able to stand in her home with + 2 assist for safety at [place].  PT recommended we continue to work on this (as blood pressure allows-pt getting this further evaluated by MD). We also discussed walking in the clinic with RW and that short term goal for home would be standing and having the ability to progress to walking as her tolerance to standing continues to improve. Patient and her family agree with plan of care.         PT Long Term Goals - 10/28/14 1216    PT LONG TERM GOAL #1   Title Verbalize understanding of: home safety with caregivers for standing.   Baseline Pt reports standing with her husband + his daughter at countertop for exercise.  This has been hindered recently by HTN.   Status Achieved   PT LONG TERM GOAL #2   Title Address w/c needs by f/u with w/c company.   Baseline Pt with a loaner w/c from injury 2 years ago.  She has not yet received her manual w/c.   Time 4   Period Weeks   Status On-going   PT LONG TERM GOAL #3   Title Maintain standing in parallel bars x6 minutes nonstop to demo improved standing tolerance with +1 assistance.   Baseline Patient maintained standing x 4 minutes with +1 assistance.   Time 4   Period Weeks   Status On-going   PT LONG TERM GOAL #4   Title The patient will ambulate with RW and mod to max assist of 1 with SBA of 1 with w/c for safety x 20  feet.   Baseline The patient ambulated for the first time with RW 10/28/14 x 8 steps with + 2 assist for safety.   Time 4   Period Weeks   Status New    Continue to LTGs 2-4, as LTG 1 is now met.      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  She has made significant gains in physical therapy with standing tolerance, ability to stand at home with assist from her husband, and with the ability to advance the L leg in standing using KAFOs bilaterally.  The patient is limited due to hypertension and is scheduled to see her MD this Friday to evaluate.                                                           Pt will benefit from skilled therapeutic intervention in order to improve on the following deficits Decreased endurance;Decreased balance;Decreased mobility;Decreased strength;Decreased range of motion;Decreased coordination;Impaired sensation;Decreased activity tolerance;Increased muscle spasms   Clinical Impairments Affecting Rehab Potential hypertension, fluctuating BP;   PT Frequency 2x / week   PT Duration 4 weeks  *pt with 5 more visits available through original voc rehab certification, however length of time will need to be extended in order to continue treatment.   PT Treatment/Interventions Therapeutic activities;Neuromuscular re-education;Manual techniques;Gait training;Therapeutic exercise;Patient/family education;Wheelchair mobility training;ADLs/Self Care Home Management;Other (comment)   PT Next Visit Plan Standing with RW, ambulation creating a L toe cap to improve advancement of the L LE with gait activities.   PT Home Exercise Plan Recommended after the patient is further evaluated by MD for HTN that she begin standing at countertop with + 2 assist from family/friends for safety with w/c behind her.   Consulted and Agree with Plan of Care Patient;Family member/caregiver     PT will also  continue to work with w/c vendor to meet w/c needs as prior w/c vendor that patient used has left that company and original chair never delivered (manual w/c).   Problem List Patient Active Problem List   Diagnosis Date Noted  . CAP (community acquired pneumonia) 11/26/2013  . Tendonitis of elbow, left 05/26/2013  . Neurogenic bladder 01/29/2013  . Neurogenic bowel 01/29/2013  . GERD (gastroesophageal reflux disease) 01/29/2013  . Myalgia and myositis, unspecified 12/24/2012  . Spastic paraplegia 12/24/2012  . Paraplegia 11/18/2012  .  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 Spinal cord compression 11/09/2012  . Lower extremity weakness 11/08/2012  . Hypokalemia 11/08/2012  . HTN (hypertension) 11/08/2012  . Myelopathy 11/08/2012  . Paraparesis of both lower limbs 11/08/2012     Thank you for the referral of this patient.                        Rudell Cobb, PT, MPT 10/28/2014 12:26 PM [facility] Phone: [phone] Fax: [phone]    [place] 10/28/2014, 12:21 PM

## 2014-11-02 ENCOUNTER — Ambulatory Visit: Payer: PRIVATE HEALTH INSURANCE | Admitting: Physical Therapy

## 2014-11-03 ENCOUNTER — Ambulatory Visit: Payer: PRIVATE HEALTH INSURANCE | Admitting: Rehabilitative and Restorative Service Providers"

## 2014-11-09 NOTE — Telephone Encounter (Signed)
PT spoke with Mr. Debbie [name] requesting extension of initial 12 visits approved from VR for Debbie [name] to attend P.T.  He reports Debbie [name] is supposed to call him to schedule a [facility] visit in order to further discuss approval for further P.T. And her case with VR.

## 2014-12-01 ENCOUNTER — Telehealth: Payer: Self-pay | Admitting: *Deleted

## 2014-12-01 NOTE — Telephone Encounter (Signed)
Pt's chart reviewed, Rx called in per office protocol, pt notified

## 2014-12-01 NOTE — Telephone Encounter (Signed)
Pt asking for refill of diazepam

## 2014-12-08 ENCOUNTER — Telehealth: Payer: Self-pay | Admitting: Rehabilitative and Restorative Service Providers"

## 2014-12-08 NOTE — Telephone Encounter (Signed)
Patient contacted and scheduled for return to P.T. On Monday, January 11 @ 11am. PT to f/u with vocation rehab to ensure authorization date extended. Original authorization date was: 08/04/2014-11/03/2014 for 12 visits. PT used 7 out of 12 visits and is requesting a date extension (from 11/03/2014 to 03/05/2015) to complete remaining 5 visits in physical therapy.  PT let patient know that we would reschedule if vocational rehab authorization not received by Friday 12/11/13.

## 2014-12-11 ENCOUNTER — Other Ambulatory Visit: Payer: Self-pay | Admitting: *Deleted

## 2014-12-11 DIAGNOSIS — IMO0002 Reserved for concepts with insufficient information to code with codable children: Secondary | ICD-10-CM

## 2014-12-11 DIAGNOSIS — IMO0001 Reserved for inherently not codable concepts without codable children: Secondary | ICD-10-CM

## 2014-12-11 DIAGNOSIS — G959 Disease of spinal cord, unspecified: Secondary | ICD-10-CM

## 2014-12-11 MED ORDER — TIZANIDINE HCL 4 MG PO TABS: 6.0000 mg | ORAL_TABLET | Freq: Four times a day (QID) | ORAL | Status: DC

## 2014-12-11 NOTE — Telephone Encounter (Signed)
Pt called asking for refill on tizanidine, chart was reviewed, Rx sent electronically per office protocol, pt called, message left

## 2014-12-14 ENCOUNTER — Ambulatory Visit: Payer: PRIVATE HEALTH INSURANCE | Admitting: Rehabilitative and Restorative Service Providers"

## 2014-12-16 ENCOUNTER — Ambulatory Visit: Payer: PRIVATE HEALTH INSURANCE | Admitting: Physical Therapy

## 2015-01-01 ENCOUNTER — Encounter: Payer: Self-pay | Admitting: Physical Medicine & Rehabilitation

## 2015-01-01 ENCOUNTER — Encounter: Payer: Medicaid Other | Attending: Physical Medicine & Rehabilitation | Admitting: Physical Medicine & Rehabilitation

## 2015-01-01 VITALS — BP 108/58 | HR 68 | Resp 14

## 2015-01-01 DIAGNOSIS — G8389 Other specified paralytic syndromes: Secondary | ICD-10-CM

## 2015-01-01 DIAGNOSIS — G9520 Unspecified cord compression: Secondary | ICD-10-CM

## 2015-01-01 DIAGNOSIS — R269 Unspecified abnormalities of gait and mobility: Secondary | ICD-10-CM | POA: Insufficient documentation

## 2015-01-01 DIAGNOSIS — M659 Synovitis and tenosynovitis, unspecified: Secondary | ICD-10-CM

## 2015-01-01 DIAGNOSIS — R531 Weakness: Secondary | ICD-10-CM | POA: Insufficient documentation

## 2015-01-01 DIAGNOSIS — M778 Other enthesopathies, not elsewhere classified: Secondary | ICD-10-CM

## 2015-01-01 DIAGNOSIS — IMO0002 Reserved for concepts with insufficient information to code with codable children: Secondary | ICD-10-CM

## 2015-01-01 MED ORDER — MELOXICAM 15 MG PO TABS: 15.0000 mg | ORAL_TABLET | Freq: Every day | ORAL | Status: DC

## 2015-01-01 NOTE — Progress Notes (Signed)
Subjective:    Patient ID: Debbie [name], female    DOB: 1976-07-04, 39 y.o.   MRN: [id number]  HPI   Debbie [name] is here in follow up of her spastic paraplegia. For the most part she has been doing well at home. She had good results with the botox injections. She returns to therapy next week. She has walked short dx at home and is working with therapy to increase the dx.  Her heel cords are tight left more than right. Her spasms tend to increase at night time.  She is having more shoulder pain at times due to her dependence upon her UE's for mobility.     Pain Inventory Average Pain 4 Pain Right Now 3 My pain is constant, sharp, dull, stabbing, tingling and aching  In the last 24 hours, has pain interfered with the following? General activity 0 Relation with others 0 Enjoyment of life 2 What TIME of day is your pain at its worst? morning, evening, night Sleep (in general) Poor  Pain is worse with: some activites Pain improves with: medication Relief from Meds: 5  Mobility ability to climb steps?  no do you drive?  no use a wheelchair  Function disabled: date disabled .  Neuro/Psych bladder control problems bowel control problems weakness numbness tingling spasms depression  Prior Studies Any changes since last visit?  no  Physicians involved in your care Any changes since last visit?  no   Family History  Problem Relation Age of Onset  . Multiple sclerosis Father   . Diabetes Mother    History   Social History  . Marital Status: Legally Separated    Spouse Name: N/A    Number of Children: N/A  . Years of Education: N/A   Social History Main Topics  . Smoking status: Former Smoker -- 1.50 packs/day for 10 years    Types: Cigarettes    Quit date: 11/07/2012  . Smokeless tobacco: None  . Alcohol Use: Yes     Comment: occ  . Drug Use: No  . Sexual Activity: Yes    Birth Control/ Protection: None   Other Topics Concern  . None   Social History  Narrative   Past Surgical History  Procedure Laterality Date  . Fracture surgery    . Cesarean section    . Laminectomy  11/11/2012    Procedure: THORACIC LAMINECTOMY FOR TUMOR;  Surgeon: Charlie Pitter, MD;  Location: [facility];  Service: Neurosurgery;  Laterality: N/A;  thracic laminectomy for intradural and intramedullary neoplasm  . Spine surgery     Past Medical History  Diagnosis Date  . Hypertension   . Spastic paraplegia   . Neurogenic bladder     and bowel  . Mass     T5-T6 intradural mass  . Chronic pain   . Chronic myofascial pain    BP 108/58 mmHg  Pulse 68  Resp 14  SpO2 99%  Opioid Risk Score:   Fall Risk Score: Low Fall Risk (0-5 points)  Review of Systems  Constitutional: Positive for fever.  Eyes: Negative.   Respiratory: Negative.   Cardiovascular: Negative.   Gastrointestinal:       Bowel control problems  Endocrine:       High blood sugar  Genitourinary:       Bladder  control problems  Musculoskeletal: Positive for back pain.  Skin: Negative.   Allergic/Immunologic: Negative.   Neurological: Positive for dizziness, weakness and numbness.  Spasms, tingling  Hematological: Negative.   Psychiatric/Behavioral: Positive for dysphoric mood.       Objective:   Physical Exam  Constitutional: She is oriented to person, place, and time. She appears well-developed and well-nourished.  HENT:  Head: Normocephalic.  Neck: Normal range of motion.  Cardiovascular: Normal rate, regular rhythm and normal heart sounds.  Pulmonary/Chest: Effort normal and breath sounds normal.  At times she has experience some pleuritic pain during the night. Denies at this time.  Musculoskeletal:  Normal Muscle Bulk: Muscle Testing Reveals: Bilateral Hand Grips: 5/5 Upper extremities with Full ROM WNL Lower Extremities: Left leg flexion spasm noted. Hip adductors 1-2/4. Left hamstring 1-2/4. gactrocs sporadically 2-3/4 bilaterally, left more than right.  Strength underlying is 2-3/5 bilateral legs right more than left. Tone breaks with passive movement  Thoracic Paraspinals muscles tenderness  Neurological: She is alert and oriented to person, place, and time. She has normal reflexes.  Skin: Skin is warm and dry.  Psychiatric: She has a normal mood and affect.   Assessment & Plan:   1. 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 Continue Current Medication Regime. Valium, Neurontin and tiZanidine.  -will set up for phenol bilaterally at knees (tibial nerve) -outpt PT to continue 2. Neurogenic bowel and bladder:  amitriptyline hs to help with frequency and nerve pain. 25mg  qhs 3. Myofascial pain:  4. Pain .   oxyCODONE 5mg  one tablet every 6 hours as needed #90 (no refills needed)--using rarely now  -meloxicam refilled today 5. Neuropathic pain: Continue Gabapentin.

## 2015-01-01 NOTE — Patient Instructions (Signed)
PLEASE CALL ME WITH ANY PROBLEMS OR QUESTIONS (#[phone]).      

## 2015-01-07 ENCOUNTER — Ambulatory Visit: Payer: Medicaid Other | Admitting: Rehabilitative and Restorative Service Providers"

## 2015-01-15 ENCOUNTER — Ambulatory Visit
Payer: Medicaid Other | Attending: Physical Medicine & Rehabilitation | Admitting: Rehabilitative and Restorative Service Providers"

## 2015-01-15 DIAGNOSIS — R531 Weakness: Secondary | ICD-10-CM | POA: Diagnosis not present

## 2015-01-15 DIAGNOSIS — R269 Unspecified abnormalities of gait and mobility: Secondary | ICD-10-CM | POA: Insufficient documentation

## 2015-01-15 NOTE — Therapy (Signed)
[facility] [address] [place], [place], [address] Phone: [phone]   Fax:  [phone]  Physical Therapy Treatment  Patient Details  Name: Debbie [name] MRN: [id number] Date of Birth: 10-22-1976 Referring Provider:  Neale Burly, MD  Encounter Date: 01/15/2015    Past Medical History  Diagnosis Date  . Hypertension   . Spastic paraplegia   . Neurogenic bladder     and bowel  . Mass     T5-T6 intradural mass  . Chronic pain   . Chronic myofascial pain     Past Surgical History  Procedure Laterality Date  . Fracture surgery    . Cesarean section    . Laminectomy  11/11/2012    Procedure: THORACIC LAMINECTOMY FOR TUMOR;  Surgeon: Charlie Pitter, MD;  Location: [facility];  Service: Neurosurgery;  Laterality: N/A;  thracic laminectomy for intradural and intramedullary neoplasm  . Spine surgery      There were no vitals taken for this visit.  Visit Diagnosis:  Generalized weakness  PT has been working with vocational rehabilitation to get date extension to continue prior plan of care established in 2015.  The patient had to meet with voc rehab counselor, and then we waited further authorization via fax.  The patient's initial plan that approved 12 visits of PT from 08/04/2014-11/03/2014 was extended until 02/02/2015.  This is visit 8/12.      Subjective Assessment - 01/15/15 0938    Symptoms The patient reports tired today.  She has been standing at home with family assistance of 2+ people for safety from w/c.    Patient Stated Goals Stand upright with KAFOs, progress to using RW in the home.   Currently in Pain? No/denies      THERAPEUTIC EXERCISE: ROM seated L ankle NWB position with -8 degrees from neutral  ROM seated R ankle NWB to -21 from neutral  Instructed in HEP for ankle heel cord stretching- see patient instructions *In standing, L LE plantarflexion appears to be increased spasticity, as she  has improved ROM in seated position. THERAPEUTIC ACTIVITIES: Pt requires assist to donn KAFOs in wheelchair She moves sit<>stand with max assist and maintains standing with CGA with bilateral UE support  Attempted ambulation in the parallel bars.  The patient is able to advance the R LE in the parallel bars.  She needed min A to help move L LE to advance in the parallel bars.  She has L ankle pain and PT noted L foot not going down into KAFO and lateral malleolus rubbing against KAFO.   SELF CARE/HOME MANAGEMENT: PT recommended patient perform seated LE movement as much as tolerated to improve motor control and ROM.  Discussed goal of PT to progress to home standing and walking for exercise.  We also discussed w/c eval needed through [employer] as patient has had loaner w/c x 2 years.                         PT Long Term Goals - 01/15/15 1710    PT LONG TERM GOAL #1   Title Verbalize understanding of: home exercise program for LE stretching, ROM and strengthening.  Target date 02/02/2015   Time 4   Period Weeks   PT LONG TERM GOAL #2   Title Address w/c needs by f/u with w/c company.  Target date 02/02/2015   Baseline Pt with a loaner w/c from injury 2 years  ago.  She has not yet received her manual w/c.   Time 4   Period Weeks   PT LONG TERM GOAL #3   Title Maintain standing in parallel bars x6 minutes nonstop to demo improved standing tolerance with +1 assistance.  Target date 02/02/2015   Time 4   Period Weeks   PT LONG TERM GOAL #4   Title The patient will ambulate with RW and mod to max assist of 1 with SBA of 1 with w/c for safety x 20 feet.  Target date 02/02/2015.   Baseline The patient ambulated for the first time with RW 10/28/14 x 8 steps with + 2 assist for safety.   Time 4   Period Weeks   PT LONG TERM GOAL #5   Title Improve ROM L ankle to  neutral in seated position, and R ankle to -8 degrees in seated.  Target date 02/02/2015.   Baseline -8 deg L and -21 deg  R   Time 4   Period Weeks               Problem List Patient Active Problem List   Diagnosis Date Noted  . CAP (community acquired pneumonia) 11/26/2013  . Tendonitis of elbow, left 05/26/2013  . Neurogenic bladder 01/29/2013  . Neurogenic bowel 01/29/2013  . GERD (gastroesophageal reflux disease) 01/29/2013  . Myalgia and myositis, unspecified 12/24/2012  . Spastic paraplegia 12/24/2012  . Paraplegia 11/18/2012  . 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 Spinal cord compression 11/09/2012  . Lower extremity weakness 11/08/2012  . Hypokalemia 11/08/2012  . HTN (hypertension) 11/08/2012  . Myelopathy 11/08/2012  . Paraparesis of both lower limbs 11/08/2012    Javarri Segal, PT 01/15/2015, 5:15 PM  [facility] [address] [place], [place], [address] Phone: [phone]   Fax:  [phone]

## 2015-01-15 NOTE — Patient Instructions (Signed)
Gastroc / Heel Cord Stretch - Seated With Towel   Sit on floor, towel around ball of foot. Gently pull foot in toward body, stretching heel cord and calf. Hold for _20-30__ seconds. Repeat on involved leg. Repeat _3__ times. Do __2_ times per day.  Copyright  VHI. All rights reserved.   Sit in chair with foot on the floor on a pillowcase or towel.  Move foot forward and back sliding pillowcase on floor.  10 times each leg.

## 2015-01-20 ENCOUNTER — Ambulatory Visit: Payer: Medicaid Other | Admitting: Physical Therapy

## 2015-01-22 ENCOUNTER — Ambulatory Visit: Payer: Medicaid Other | Admitting: Physical Therapy

## 2015-01-22 DIAGNOSIS — R531 Weakness: Secondary | ICD-10-CM | POA: Diagnosis not present

## 2015-01-22 DIAGNOSIS — R269 Unspecified abnormalities of gait and mobility: Secondary | ICD-10-CM

## 2015-01-22 NOTE — Therapy (Signed)
[facility] [address] [place], [place], [address] Phone: [phone]   Fax:  [phone]  Physical Therapy Treatment  Patient Details  Name: Debbie [name] MRN: [id number] Date of Birth: January 12, 1976 Referring Provider:  Neale Burly, MD  Encounter Date: 01/22/2015      PT End of Session - 01/22/15 0939    Visit Number 9   Number of Visits 12   Date for PT Re-Evaluation 02/02/15   Authorization Type Vocational Rehabilitation   Authorization Time Period 08/04/2014-02/02/2015   Authorization - Visit Number 9   Authorization - Number of Visits 12   PT Start Time 0933   PT Stop Time 1015   PT Time Calculation (min) 42 min   Equipment Utilized During Treatment Gait belt;Other (comment)  bilateral KAFOs.   Activity Tolerance Patient tolerated treatment well;Treatment limited secondary to medical complications (Comment)   Behavior During Therapy [facility] for tasks assessed/performed      Past Medical History  Diagnosis Date  . Hypertension   . Spastic paraplegia   . Neurogenic bladder     and bowel  . Mass     T5-T6 intradural mass  . Chronic pain   . Chronic myofascial pain     Past Surgical History  Procedure Laterality Date  . Fracture surgery    . Cesarean section    . Laminectomy  11/11/2012    Procedure: THORACIC LAMINECTOMY FOR TUMOR;  Surgeon: Charlie Pitter, MD;  Location: [facility];  Service: Neurosurgery;  Laterality: N/A;  thracic laminectomy for intradural and intramedullary neoplasm  . Spine surgery      There were no vitals taken for this visit.  Visit Diagnosis:  Abnormality of gait      Subjective Assessment - 01/22/15 0939    Symptoms No new complaints. No pain or falls to report.l   Currently in Pain? No/denies     Treatment In parallel bars - Min assist with standing with bil knees blocked and facilitation provided for knee extension in KAFO's.   - gait in parallel bars with min  assist. Cues on posture and weight shifting with gait. Min assist initally with advancing left leg with simulated toe cap, progressing to pt self advancing leg. - min assist to sit back to wheelchair with cues on technique.  At mat - sit/stand x 4 reps to/from mat with walker assist. Facilitation through bil legs for knee extension with standing. Assist needed to manage locks with sitting down, with them giving partially each time before strap pulled to unlock them. - standing at walker: worked on posture, neutral positioning with increased leg weight bearing and decreased UE reliance on walker. Worked toward pt being able to reach up and out with one UE support only on walker and maintain balance. Cues needed on posture, positioning and weight shifting with min to mod assist for balance with activities performed.        PT Education - 01/22/15 1919    Education provided Yes   Education Details correct way for family to assist pt with standing at home   Person(s) Educated Patient;Caregiver(s);Other (comment)  fiance, new nurse   Methods Explanation;Demonstration;Verbal cues   Comprehension Verbalized understanding;Need further instruction           PT Long Term Goals - 01/15/15 1710    PT LONG TERM GOAL #1   Title Verbalize understanding of: home exercise program for LE stretching, ROM and strengthening.  Target date 02/02/2015  Time 4   Period Weeks   PT LONG TERM GOAL #2   Title Address w/c needs by f/u with w/c company.  Target date 02/02/2015   Baseline Pt with a loaner w/c from injury 2 years ago.  She has not yet received her manual w/c.   Time 4   Period Weeks   PT LONG TERM GOAL #3   Title Maintain standing in parallel bars x6 minutes nonstop to demo improved standing tolerance with +1 assistance.  Target date 02/02/2015   Time 4   Period Weeks   PT LONG TERM GOAL #4   Title The patient will ambulate with RW and mod to max assist of 1 with SBA of 1 with w/c for safety x 20  feet.  Target date 02/02/2015.   Baseline The patient ambulated for the first time with RW 10/28/14 x 8 steps with + 2 assist for safety.   Time 4   Period Weeks   PT LONG TERM GOAL #5   Title Improve ROM L ankle to  neutral in seated position, and R ankle to -8 degrees in seated.  Target date 02/02/2015.   Baseline -8 deg L and -21 deg R   Time 4   Period Weeks            Plan - 01/22/15 0940    Clinical Impression Statement Pt with good progress toward goals today, able to stand and achieve increased left heel contact with standing.    Pt will benefit from skilled therapeutic intervention in order to improve on the following deficits Decreased endurance;Decreased balance;Decreased mobility;Decreased strength;Decreased range of motion;Decreased coordination;Impaired sensation;Decreased activity tolerance;Increased muscle spasms   Rehab Potential Good   Clinical Impairments Affecting Rehab Potential hypertension, fluctuating BP;   PT Frequency 2x / week   PT Duration 3 weeks   PT Treatment/Interventions Therapeutic activities;Neuromuscular re-education;Manual techniques;Gait training;Therapeutic exercise;Patient/family education;Wheelchair mobility training;ADLs/Self Care Home Management;Other (comment)   PT Next Visit Plan Standing with RW, ambulation creating a L toe cap to improve advancement of the L LE with gait activities.   Consulted and Agree with Plan of Care Patient;Family member/caregiver   Family Member Consulted spouse        Problem List Patient Active Problem List   Diagnosis Date Noted  . CAP (community acquired pneumonia) 11/26/2013  . Tendonitis of elbow, left 05/26/2013  . Neurogenic bladder 01/29/2013  . Neurogenic bowel 01/29/2013  . GERD (gastroesophageal reflux disease) 01/29/2013  . Myalgia and myositis, unspecified 12/24/2012  . Spastic paraplegia 12/24/2012  . Paraplegia 11/18/2012  . 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 Spinal cord  compression 11/09/2012  . Lower extremity weakness 11/08/2012  . Hypokalemia 11/08/2012  . HTN (hypertension) 11/08/2012  . Myelopathy 11/08/2012  . Paraparesis of both lower limbs 11/08/2012    Willow Ora 01/22/2015, 7:20 PM  Willow Ora, PTA, [facility] [address], [place], [place] [address] [phone] 01/22/2015, 7:20 PM

## 2015-01-27 ENCOUNTER — Ambulatory Visit: Payer: Medicaid Other | Admitting: Rehabilitative and Restorative Service Providers"

## 2015-01-29 ENCOUNTER — Ambulatory Visit: Payer: Medicaid Other | Admitting: Rehabilitative and Restorative Service Providers"

## 2015-02-08 ENCOUNTER — Ambulatory Visit: Payer: PRIVATE HEALTH INSURANCE | Admitting: Physical Therapy

## 2015-02-08 ENCOUNTER — Ambulatory Visit
Payer: PRIVATE HEALTH INSURANCE | Attending: Physical Medicine & Rehabilitation | Admitting: Rehabilitative and Restorative Service Providers"

## 2015-02-08 DIAGNOSIS — R269 Unspecified abnormalities of gait and mobility: Secondary | ICD-10-CM | POA: Insufficient documentation

## 2015-02-08 DIAGNOSIS — G8220 Paraplegia, unspecified: Secondary | ICD-10-CM

## 2015-02-08 DIAGNOSIS — R531 Weakness: Secondary | ICD-10-CM | POA: Diagnosis present

## 2015-02-08 NOTE — Therapy (Signed)
[facility] [address] [place], [place], [address] Phone: [phone]   Fax:  [phone]  Physical Therapy Evaluation  Patient Details  Name: Debbie [name] MRN: [id number] Date of Birth: 20-Nov-1976 Referring Provider:  Neale Burly, MD  Encounter Date: 02/08/2015    Past Medical History  Diagnosis Date  . Hypertension   . Spastic paraplegia   . Neurogenic bladder     and bowel  . Mass     T5-T6 intradural mass  . Chronic pain   . Chronic myofascial pain     Past Surgical History  Procedure Laterality Date  . Fracture surgery    . Cesarean section    . 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 Spine surgery      There were no vitals taken for this visit.  Visit Diagnosis:  Paraplegia    Mobility/Seating Evaluation      PATIENT INFORMATION: Name: Debbie [name] DOB: 1976/11/30  Sex: F Date seen: 02/08/2015 Time: 0930  Address:  [address]  [place], [place] [address] Physician: Dr. Alger Simons This evaluation/justification form will serve as the LMN for the following suppliers: __________________________ Supplier: [employer]  Contact Person: Deberah Pelton, ATP Phone:  ?????   Seating Therapist: Rudell Cobb, PT Phone:   [phone]   Phone: [phone]    Spouse/Parent/Caregiver name: ?????  Phone number: ????? Insurance/Payer: m-[email or web address]     Reason for Referral: obtain permanent manual wheelchair  Patient/Caregiver Goals: Obtain a manual wheelchair   MEDICAL HISTORY: Diagnosis: Primary Diagnosis: SCI s/p tumor resection T5-T6  Onset: 11/2012 Diagnosis: ?????   [] Progressive Disease Relevant past and future surgeries: tumor resection   Height: 5'8" Weight: 215 lbs Explain recent changes or trends in  weight: ?????   History including Falls: Patient has been wheelchair bound since 11/2012 due to tumor on spinal cord.  She obtained a loaner chair is not being evaluated for permanent manual wheelchair.    HOME ENVIRONMENT: [x] House  [] Condo/town home  [] Apartment  [] Assisted Living    [] Lives Alone [x]  Lives with Others                                                                                          Hours with caregiver: 11-11.5 caregiver hours/week; alone 8-10 hours/day  [x] Home is accessible to patient           Stairs      [] Yes []  No     Ramp [x] Yes [] No Comments:  ?????   COMMUNITY ADL: TRANSPORTATION: [x] Car    [] Van    [] Public Transportation    [] Adapted w/c Lift    [] Ambulance    [] Other:       [] Sits in wheelchair during transport  Employment/School: undergoing voc rehab consultation Specific requirements pertaining to mobility ?????  Other: transfers using sliding board into/out of car    FUNCTIONAL/SENSORY PROCESSING SKILLS:  Handedness:   [x] Right     [] Left    []   NA  Comments:  ?????  Functional Processing Skills for Wheeled Mobility [] Processing Skills are adequate for safe wheelchair operation  Areas of concern than may interfere with safe operation of wheelchair Description of problem   []  Attention to environment      [] Judgment      []  Hearing  []  Vision or visual processing      [] Motor Planning  []  Fluctuations in Behavior  ?????    VERBAL COMMUNICATION: [x] WFL receptive [x]  WFL expressive [] Understandable  [] Difficult to understand  [] non-communicative []  Uses an augmented communication device    SENSATION and SKIN ISSUES: Sensation [] Intact  [x] Impaired [] Absent  Level of sensation: has zones of partial preservation sensation in L leg, absent sensation R leg; sensory changes at abdomen Pressure Relief: Able to perform effective pressure relief :    [x] Yes  []  No Method: press-ups and lateral weight shifting If not, Why?: ?????  Skin Issues/Skin  Integrity Current Skin Issues  [] Yes [x] No [] Intact []  Red area[]  Open Area  [] Scar Tissue [] At risk from prolonged sitting Where  ?????  History of Skin Issues  [] Yes [x] No Where  ????? When  ?????  Hx of skin flap surgeries  [] Yes [x] No Where  ????? When  ?????  Limited sitting tolerance [] Yes [x] No Hours spent sitting in wheelchair daily: ?????  Complaint of Pain:  Please describe: low back discomfort with prolonged sitting   Swelling/Edema: present in lower legs and feet when out of bed all day  ADL STATUS (in reference to wheelchair use):  Indep Assist Unable Indep with Equip Not assessed Comments  Dressing x ??? ????? ????? ????? ?????  Eating x ???? ????? ????? ????? ?????  Toileting x ???? ????? ????? ????? ?????  Bathing ????? x ????? ????? ????? ?????  Grooming/Hygiene x ??? ????? ????? ????? ?????  Meal Prep x ???? ????? ????? ????? ?????  IADLS ????? x ????? ????? ????? ?????  Bowel Management: [] Continent  [] Incontinent  [x] Accidents Comments:  has some bowel sensation, but unable to control  Bladder Management: [] Continent  [] Incontinent  [x] Accidents Comments:  urgency incontinence, can sense full bladder    CURRENT SEATING / MOBILITY:  Current Mobility Base:  [] None [] Dependent [x] Manual [] Scooter [] Power  Type of Control: loaner wheelchair  Manufacturer:  sunriseSize:  ?????Age: 39 years  Current Condition of Mobility Base:  loaner wheelchair with armrest broken, brakes don't engage   Current Wheelchair components:  manual folding wheelchair  Describe posture in present seating system:  lumbar posterior pelvic tilt with back pain   WHEELCHAIR SKILLS: Manual w/c Propulsion: [x] UE or LE strength and endurance sufficient to participate in ADLs using manual wheelchair Arm : [] left [] right   [x] Both      Distance: ????? Foot:  [] left [] right   [] Both  Operate Scooter: []  Strength, hand grip, balance and transfer appropriate for use [] Living environment is accessible  for use of scooter  Operate Power w/c:  []  Std. Joystick   []  Alternative Controls Indep []  Assist []  Dependent/unable []  N/A []   [] Safe          []  Functional      Distance: ?????  Bed confined without wheelchair [x]  Yes []  No   STRENGTH/RANGE OF MOTION:  ????? Range of Motion Strength  Shoulder WFLs 5/5 shoulder abduction and flexion  Elbow WFLs 5/5 elbow flexion and extension  Wrist/Hand WFLs 5/5 wrist flexion/extension  Hip WFLs R 3/5 hip flexion and 2/5 hip extension, 2/5 hip IR/ER L trace 1/5 hip flexion, 0/5  hip extension, 1/5 hip IR/ER  Knee WFLs R 3/5 knee flexion and extension L trace 1/5 knee extension, 0/5 knee flexion  Ankle WFLs R 2/5 ankle dorsiflexion and plantarflexion L trace 1/5    MOBILITY/BALANCE:  []  Patient is totally dependent for mobility  ?????    Balance Transfers Ambulation  Sitting Balance: Standing Balance: [x]  Independent []  Independent/Modified Independent  [x]  WFL     []  WFL []  Supervision []  Supervision  [x]  Uses UE for balance  []  Supervision []  Min Assist []  Ambulates with Assist  ?????    []  Min Assist []  Min assist []  Mod Assist []  Ambulates with Device:      []  RW  []  StW  []  Cane  []  ?????  []  Mod Assist []  Mod assist []  Max assist   []  Max Assist []  Max assist []  Dependent []  Indep. Short Distance Only  []  Unable [x]  Unable []  Lift / Sling Required Distance (in feet)  ?????   [x]  Sliding board [x]  Unable to Ambulate (see explanation below)  Cardio Status:  [x] Intact  []  Impaired   []  NA     ?????  Respiratory Status:  [x] Intact   [] Impaired   [] NA     ?????  Orthotics/Prosthetics: ?????  Comments (Address manual vs power w/c vs scooter): The patient transfers at home using sliding board and boost transfers (when moving from elevated hospital bed to lower surface wheelchair)  She can perform sitting without UE support, but has loss of balance with external perterbations.      MAT EVALUATION: See vendor notes for  measurements  POSTURE:  Describe Reflexes/tonal influence on body: muscle spasms present and improved with medications      Anterior / Posterior Obliquity Rotation-Pelvis ?????        PELVIS                []  [x]  []   Neutral Posterior Anterior  [x]  []  []   WFL Rt elev Lt elev  [x]  []  []   WFL Right Left                      Anterior    Anterior      []  Fixed []  Other []  Partly Flexible [x]  Flexible   []  Fixed []  Other []  Partly Flexible  []  Flexible  []  Fixed []  Other []  Partly Flexible  []  Flexible     TRUNK Anterior / Posterior Left Right Rotation-shoulders and upper trunk    ?????    [x]  []  []   WFL ? Thoracic ? Lumbar  Kyphosis Lordosis  [x]  []  []   WFL Convex Convex  Right Left [] c-curve [] s-curve [] multiple  [x]  Neutral []  Left-anterior []  Right-anterior     []  Fixed []  Flexible []  Partly Flexible []  Other  []  Fixed []  Flexible []  Partly Flexible []  Other  []  Fixed             []  Flexible []  Partly Flexible []  Other    Position Windswept  ?????        HIPS                      [x]            []               []    Neutral       Abduct        ADduct         [x]           []            []   Neutral Right           Left      []  Fixed []  Subluxed []  Partly Flexible []  Dislocated []  Flexible  []  Fixed []  Other []  Partly Flexible  []  Flexible                 Foot Positioning Knee Positioning  ?????    [x]  WFL  [] Lt [] Rt [x]  WFL  [] Lt [] Rt    KNEES ROM concerns: ROM concerns:    & Dorsi-Flexed [] Lt [] Rt ?????    FEET Plantar Flexed [] Lt [] Rt      Inversion                 [] Lt [] Rt      Eversion                 [] Lt [] Rt     HEAD [x]  Functional [x]  Good Head Control  ?????  & []  Flexed         []  Extended []  Adequate Head Control    NECK []  Rotated  Lt  []  Lat Flexed Lt []  Rotated  Rt []  Lat Flexed Rt []  Limited Head Control     []  Cervical Hyperextension []  Absent  Head Control     SHOULDERS ELBOWS WRIST& HAND 5/5 wrist position      Left      Right    Left     Right    Left     Right   U/E [x] Functional           [x] Functional 5/5 5/5 [] Fisting             [] Fisting      [] elev   [] dep      [] elev   [] dep       [] pro -[] retract     [] pro  [] retract [] subluxed             [] subluxed       Goals for Wheelchair Mobility  [x]  Independence with mobility in the home with motor related ADLs (MRADLs)  [x]  Independence with MRADLs in the community []  Provide dependent mobility  []  Provide recline     [] Provide tilt   Goals for Seating system [x]  Optimize pressure distribution [x]  Provide support needed to facilitate function or safety []  Provide corrective forces to assist with maintaining or improving posture []  Accommodate client's posture:   current seated postures and positions are not flexible or will not tolerate corrective forces []  Client to be independent with relieving pressure in the wheelchair [] Enhance physiological function such as breathing, swallowing, digestion  Simulation ideas/Equipment trials:Patient has been using manual wheelchair >2 years State why other equipment was unsuccessful:n/a   MOBILITY BASE RECOMMENDATIONS and JUSTIFICATION: MOBILITY COMPONENT JUSTIFICATION  Manufacturer: [employer] Model: Aero Z   Size: Width 18"Seat Depth 19" [x] provide transport from point A to B      [x] promote Indep mobility  [x] is not a safe, functional ambulator [x] walker or cane inadequate [] non-standard width/depth necessary to accommodate anatomical measurement []  ?????  [x] Manual Mobility Base [x] non-functional ambulator    [] Scooter/POV  [] can safely operate  [] can safely transfer   [] has adequate trunk stability  [] cannot functionally propel manual w/c  [] Power Mobility Base  [] non-ambulatory  [] cannot functionally propel manual wheelchair  []  cannot functionally and safely operate scooter/POV [] can safely operate and willing to  [] Stroller Base [] infant/child  [] unable to propel manual wheelchair [] allows for  growth [] non-functional ambulator [] non-functional UE [] Indep mobility is  not a goal at this time  [] Tilt  [] Forward [] Backward [] Powered tilt  [] Manual tilt  [] change position against gravitational force on head and shoulders  [] change position for pressure relief/cannot weight shift [] transfers  [] management of tone [] rest periods [] control edema [] facilitate postural control  []  ?????  [] Recline  [] Power recline on power base [] Manual recline on manual base  [] accommodate femur to back angle  [] bring to full recline for ADL care  [] change position for pressure relief/cannot weight shift [] rest periods [] repositioning for transfers or clothing/diaper /catheter changes [] head positioning  [x] Lighter weight required [x] self- propulsion  [x] lifting [x]  ultra lightweight wheelchair  [] Heavy Duty required [] user weight greater than 250# [] extreme tone/ over active movement [] broken frame on previous chair []  ?????  [x]  Back  []  Angle Adjustable []  Custom molded Positioning Back:  [place] [x] postural control [] control of tone/spasticity [] accommodation of range of motion [x] UE functional control [] accommodation for seating system []  ????? [] provide lateral trunk support [] accommodate deformity [x] provide posterior trunk support [] provide lumbar/sacral support [] support trunk in midline [] Pressure relief over spinal processes  [x]  Seat Cushion comfort cushion: ascent cushion/ memory foam   [x] impaired sensation  [] decubitus ulcers present [] history of pressure ulceration [] prevent pelvic extension [x] low maintenance  [x] stabilize pelvis  [] accommodate obliquity [] accommodate multiple deformity [x] neutralize lower extremity position [x] increase pressure distribution [x]  durable/cleanable cover due to incontinence  []  Pelvic/thigh support  []  Lateral thigh guide []  Distal medial pad  []  Distal lateral pad []  pelvis in neutral [] accommodate pelvis []  position upper legs  []  alignment []  accommodate ROM []  decr adduction [] accommodate tone [] removable for transfers [] decr abduction  []  Lateral trunk Supports []  Lt     []  Rt [] decrease lateral trunk leaning [] control tone [] contour for increased contact [] safety  [] accommodate asymmetry []  ?????  [x]  Mounting hardware  [] lateral trunk supports  [x] back   [] seat [] headrest      []  thigh support [] fixed   [] swing away [x] attach seat platform/cushion to w/c frame [] attach back cushion to w/c frame [] mount postural supports [] mount headrest  [] swing medial thigh support away [] swing lateral supports away for transfers  []  ?????    Armrests  [] fixed [] adjustable height [x] removable   [x] swing away  [] flip back   [] reclining [] full length pads [] desk    [x] pads tubular  [x] provide support with elbow at 90   [] provide support for w/c tray [] change of height/angles for variable activities [x] remove for transfers [x] allow to come closer to table top [x] remove for access to tables []  ?????  Hangers/ Leg rests  [] 60 [] 70 [x] 90 [] elevating [] heavy duty  [] articulating [] fixed [] lift off [] swing away     [] power [x] provide LE support  [] accommodate to hamstring tightness [] elevate legs during recline   [] provide change in position for Legs [x] Maintain placement of feet on footplate [] durability [] enable transfers [] decrease edema [] Accommodate lower leg length []  ?????  Foot support Footplate    [] Lt  []  Rt  [x]  Center mount [x] flip up     [] depth/angle adjustable [] Amputee adapter    []  Lt     []  Rt [x] provide foot support [] accommodate to ankle ROM [x] transfers [] Provide support for residual extremity [x]  allow foot to go under wheelchair base []  decrease tone  []  ?????  []  Ankle strap/heel loops [] support foot on foot support [] decrease extraneous movement [] provide input to heel  [] protect foot  Tires: [] pneumatic  [] flat free inserts  [x] solid  [x] decrease maintenance  [x] prevent  frequent flats [] increase shock absorbency [] decrease pain from road shock [] decrease spasms from  road shock [x]  plastic mag  []  Headrest  [] provide posterior head support [] provide posterior neck support [] provide lateral head support [] provide anterior head support [] support during tilt and recline [] improve feeding   [] improve respiration [] placement of switches [] safety  [] accommodate ROM  [] accommodate tone [] improve visual orientation  []  Anterior chest strap []  Vest []  Shoulder retractors  [] decrease forward movement of shoulder [] accommodation of TLSO [] decrease forward movement of trunk [] decrease shoulder elevation [] added abdominal support [] alignment [] assistance with shoulder control  []  ?????  Pelvic Positioner [x] Belt [] SubASIS bar [] Dual Pull [] stabilize tone [x] decrease falling out of chair/ **will not Decr potential for sliding due to pelvic tilting [] prevent excessive rotation [] pad for protection over boney prominence [] prominence comfort [] special pull angle to control rotation []  ?????  Upper Extremity Support [] L   []  R [] Arm trough    [] hand support []  tray       [] full tray [] swivel mount [] decrease edema      [] decrease subluxation   [] control tone   [] placement for AAC/Computer/EADL [] decrease gravitational pull on shoulders [] provide midline positioning [] provide support to increase UE function [] provide hand support in natural position [] provide work surface   POWER WHEELCHAIR CONTROLS  [] Proportional  [] Non-Proportional Type ????? [] Left  [] Right [] provides access for controlling wheelchair   [] lacks motor control to operate proportional drive control [] unable to understand proportional controls  Actuator Control Module  [] Single  [] Multiple   [] Allow the client to operate the power seat function(s) through the joystick control   [] Safety Reset Switches [] Used to change modes and stop the wheelchair when driving in latch mode     [] Upgraded Electronics   [] programming for accurate control [] progressive Disease/changing condition [] non-proportional drive control needed [] Needed in order to operate power seat functions through joystick control   [] Display box [] Allows user to see in which mode and drive the wheelchair is set  [] necessary for alternate controls    [] Digital interface electronics [] Allows w/c to operate when using alternative drive controls  [] ASL Head Array [] Allows client to operate wheelchair  through switches placed in tri-panel headrest  [] Sip and puff with tubing kit [] needed to operate sip and puff drive controls  [] Upgraded tracking electronics [] increase safety when driving [] correct tracking when on uneven surfaces  [] Mount for switches or joystick [] Attaches switches to w/c  [] Swing away for access or transfers [] midline for optimal placement [] provides for consistent access  [] Attendant controlled joystick plus mount [] safety [] long distance driving [] operation of seat functions [] compliance with transportation regulations []  ?????    Rear wheel placement/Axle adjustability [x] None [] semi adjustable [] fully adjustable  [] improved UE access to wheels [] improved stability [] changing angle in space for improvement of postural stability [] 1-arm drive access [] amputee pad placement []  ?????  Wheel rims/ hand rims  [] metal  [x] plastic coated [] oblique projections [] vertical projections [x] Provide ability to propel manual wheelchair  []  Increase self-propulsion with hand weakness/decreased grasp  Push handles [x] extended  [] angle adjustable  [] standard [] caregiver access [x] caregiver assist *telescoping [x] allows "hooking" to enable increased ability to perform ADLs or maintain balance  One armed device  [] Lt   [] Rt [] enable propulsion of manual wheelchair with one arm   []  ?????   Brake/wheel lock extension  []  Lt   []  Rt [] increase indep in applying wheel locks   [x] Side guards  [x] prevent clothing getting caught in wheel or becoming soiled []  prevent skin tears/abrasions  Battery: ????? [] to power wheelchair ?????  Other: scissor lock brakes ????? ?????  The above equipment has a  life- long use expectancy. Growth and changes in medical and/or functional conditions would be the exceptions. This is to certify that the therapist has no financial relationship with durable medical provider or manufacturer. The therapist will not receive remuneration of any kind for the equipment recommended in this evaluation.   Patient has mobility limitation that significantly impairs safe, timely participation in one or more mobility related ADL's.  (bathing, toileting, feeding, dressing, grooming, moving from room to room)                                                             [x]  Yes []  No Will mobility device sufficiently improve ability to participate and/or be aided in participation of MRADL's?         [x]  Yes []  No Can limitation be compensated for with use of a cane or walker?                                                                                []  Yes [x]  No Does patient or caregiver demonstrate ability/potential ability & willingness to safely use the mobility device?   [x]  Yes []  No Does patient's home environment support use of recommended mobility device?                                                    [x]  Yes []  No Does patient have sufficient upper extremity function necessary to functionally propel a manual wheelchair?    [x]  Yes []  No Does patient have sufficient strength and trunk stability to safely operate a POV (scooter)?                                  []  Yes []  No Does patient need additional features/benefits provided by a power wheelchair for MRADL's in the home?       []  Yes []  No Does the patient demonstrate the ability to safely use a power wheelchair?                                                              []  Yes []  No  Therapist Name Printed:  Rudell Cobb, MPT Date: 02/08/2015  Therapist's Signature:   Date:   Supplier's Name Printed: Deberah Pelton, [employer] Date: 02/08/2015  Supplier's Signature:   Date:  Patient/Caregiver Signature:   Date:     This is to certify that I have read this evaluation and do agree with the content within:    Physician's Name Printed: Alger Simons, MD  60 Signature:  Date:  This is to certify that I, the above signed therapist have the following affiliations: []  This DME provider []  Manufacturer of recommended equipment []  Patient's long term care facility [x]  None of the above                     Problem List Patient Active Problem List   Diagnosis Date Noted  . CAP (community acquired pneumonia) 11/26/2013  . Tendonitis of elbow, left 05/26/2013  . Neurogenic bladder 01/29/2013  . Neurogenic bowel 01/29/2013  . GERD (gastroesophageal reflux disease) 01/29/2013  . Myalgia and myositis, unspecified 12/24/2012  . Spastic paraplegia 12/24/2012  . Paraplegia 11/18/2012  . 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 Spinal cord compression 11/09/2012  . Lower extremity weakness 11/08/2012  . Hypokalemia 11/08/2012  . HTN (hypertension) 11/08/2012  . Myelopathy 11/08/2012  . Paraparesis of both lower limbs 11/08/2012    ,Zohan Shiflet, PT 02/08/2015, 10:54 AM  [facility] [address] [place], [place], [address] Phone: [phone]   Fax:  [phone]

## 2015-02-09 ENCOUNTER — Encounter: Payer: Medicaid Other | Admitting: Physical Medicine & Rehabilitation

## 2015-02-11 ENCOUNTER — Telehealth: Payer: Self-pay | Admitting: *Deleted

## 2015-02-11 DIAGNOSIS — G9520 Unspecified cord compression: Secondary | ICD-10-CM

## 2015-02-11 DIAGNOSIS — IMO0002 Reserved for concepts with insufficient information to code with codable children: Secondary | ICD-10-CM

## 2015-02-11 MED ORDER — DIAZEPAM 5 MG PO TABS: 5.0000 mg | ORAL_TABLET | Freq: Three times a day (TID) | ORAL | Status: DC | PRN

## 2015-02-11 NOTE — Telephone Encounter (Signed)
Requesting a refill on her diazepam.  Says she called last week about refill but there is no record of call.  I spoke with her and told her we called refill to pharmacy.

## 2015-03-11 ENCOUNTER — Telehealth: Payer: Self-pay | Admitting: *Deleted

## 2015-03-11 DIAGNOSIS — IMO0002 Reserved for concepts with insufficient information to code with codable children: Secondary | ICD-10-CM

## 2015-03-11 MED ORDER — GABAPENTIN 300 MG PO CAPS: 600.0000 mg | ORAL_CAPSULE | Freq: Three times a day (TID) | ORAL | Status: DC

## 2015-03-11 NOTE — Telephone Encounter (Signed)
Needs gabapentin refill. Done and notified

## 2015-03-29 ENCOUNTER — Encounter: Payer: Medicaid Other | Admitting: Physical Medicine & Rehabilitation

## 2015-04-26 ENCOUNTER — Telehealth: Payer: Self-pay | Admitting: *Deleted

## 2015-04-26 DIAGNOSIS — IMO0002 Reserved for concepts with insufficient information to code with codable children: Secondary | ICD-10-CM

## 2015-04-26 DIAGNOSIS — G9520 Unspecified cord compression: Secondary | ICD-10-CM

## 2015-04-26 MED ORDER — DIAZEPAM 5 MG PO TABS: 5.0000 mg | ORAL_TABLET | Freq: Three times a day (TID) | ORAL | Status: DC | PRN

## 2015-04-26 NOTE — Telephone Encounter (Signed)
Judy called for a refill on her diazepam.  Phoned refill to pharmacy and Abigail Butts notified,

## 2015-05-26 ENCOUNTER — Encounter: Payer: Self-pay | Admitting: Physical Medicine & Rehabilitation

## 2015-05-26 ENCOUNTER — Encounter: Payer: Medicare Other | Attending: Physical Medicine & Rehabilitation | Admitting: Physical Medicine & Rehabilitation

## 2015-05-26 VITALS — BP 132/72 | HR 70 | Resp 14

## 2015-05-26 DIAGNOSIS — M791 Myalgia: Secondary | ICD-10-CM

## 2015-05-26 DIAGNOSIS — G9520 Unspecified cord compression: Secondary | ICD-10-CM | POA: Diagnosis not present

## 2015-05-26 DIAGNOSIS — G8389 Other specified paralytic syndromes: Secondary | ICD-10-CM

## 2015-05-26 DIAGNOSIS — IMO0002 Reserved for concepts with insufficient information to code with codable children: Secondary | ICD-10-CM

## 2015-05-26 DIAGNOSIS — G8220 Paraplegia, unspecified: Secondary | ICD-10-CM

## 2015-05-26 DIAGNOSIS — IMO0001 Reserved for inherently not codable concepts without codable children: Secondary | ICD-10-CM

## 2015-05-26 DIAGNOSIS — M609 Myositis, unspecified: Secondary | ICD-10-CM

## 2015-05-26 MED ORDER — OXYCODONE HCL 5 MG PO TABS: 5.0000 mg | ORAL_TABLET | Freq: Four times a day (QID) | ORAL | Status: DC | PRN

## 2015-05-26 NOTE — Progress Notes (Signed)
Bilateral tibial nerve phenol injections.   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  Informed consent was obtained after describing the risks and benefits of the procedure with the patient this includes bleeding bruising and infection as well as medication side effects. The patient elected to proceed and has given written consent. Patient placed in a prone position on the exam table. External DC stimulation was applied to the popliteal space using a nerve stimulator. Plantar flexion twitch was obtained. The popliteal region was prepped with Betadine and then entered with a 22-gauge 40 mm needle electrode under electrical stimulation guidance. Plantar flexion which was obtained and confirmed. Then 5 cc of 5% phenol were injected. The patient tolerated procedure well. Post procedure instructions and followup visit were given.  She will proceed with her home exercise program for stretching and gait.   follow up visit in 3 months

## 2015-05-26 NOTE — Patient Instructions (Signed)
PLEASE CALL ME WITH ANY PROBLEMS OR QUESTIONS (#[phone]).  HAVE A GOOD DAY!    

## 2015-05-26 NOTE — Progress Notes (Deleted)
   Subjective:    Patient ID: Debbie [name], female    DOB: [birth year], 39 y.o.   MRN: [id number]  HPI    Review of Systems     Objective:   Physical Exam        Assessment & Plan:

## 2015-06-08 ENCOUNTER — Telehealth: Payer: Self-pay | Admitting: *Deleted

## 2015-06-08 DIAGNOSIS — IMO0001 Reserved for inherently not codable concepts without codable children: Secondary | ICD-10-CM

## 2015-06-08 DIAGNOSIS — IMO0002 Reserved for concepts with insufficient information to code with codable children: Secondary | ICD-10-CM

## 2015-06-08 DIAGNOSIS — G959 Disease of spinal cord, unspecified: Secondary | ICD-10-CM

## 2015-06-08 MED ORDER — TIZANIDINE HCL 4 MG PO TABS: 6.0000 mg | ORAL_TABLET | Freq: Four times a day (QID) | ORAL | Status: DC

## 2015-06-08 NOTE — Telephone Encounter (Signed)
Katherine is requesting refill on her tizanidine.  Sent to pharmacy and Abigail Butts notified.

## 2015-07-01 DIAGNOSIS — I10 Essential (primary) hypertension: Secondary | ICD-10-CM | POA: Diagnosis not present

## 2015-07-01 DIAGNOSIS — E1165 Type 2 diabetes mellitus with hyperglycemia: Secondary | ICD-10-CM | POA: Diagnosis not present

## 2015-07-22 ENCOUNTER — Telehealth: Payer: Self-pay | Admitting: *Deleted

## 2015-07-22 DIAGNOSIS — G9520 Unspecified cord compression: Secondary | ICD-10-CM

## 2015-07-22 DIAGNOSIS — IMO0002 Reserved for concepts with insufficient information to code with codable children: Secondary | ICD-10-CM

## 2015-07-22 MED ORDER — DIAZEPAM 5 MG PO TABS: 5.0000 mg | ORAL_TABLET | Freq: Three times a day (TID) | ORAL | Status: DC | PRN

## 2015-07-22 NOTE — Telephone Encounter (Signed)
Refill request for Debbie [name]'s valium.  Called to pharmacy and Abigail Butts notified.

## 2015-08-02 ENCOUNTER — Telehealth: Payer: Self-pay | Admitting: *Deleted

## 2015-08-02 DIAGNOSIS — IMO0002 Reserved for concepts with insufficient information to code with codable children: Secondary | ICD-10-CM

## 2015-08-02 DIAGNOSIS — M778 Other enthesopathies, not elsewhere classified: Secondary | ICD-10-CM

## 2015-08-02 MED ORDER — GABAPENTIN 300 MG PO CAPS: 600.0000 mg | ORAL_CAPSULE | Freq: Three times a day (TID) | ORAL | Status: DC

## 2015-08-02 MED ORDER — MELOXICAM 15 MG PO TABS: 15.0000 mg | ORAL_TABLET | Freq: Every day | ORAL | Status: DC

## 2015-08-02 NOTE — Telephone Encounter (Signed)
Requesting refill on gabapentin and meloxicam.  Refills sent to pharmacy.

## 2015-08-25 ENCOUNTER — Encounter: Payer: Medicaid Other | Admitting: Physical Medicine & Rehabilitation

## 2015-09-20 ENCOUNTER — Encounter: Payer: Medicaid Other | Admitting: Physical Medicine & Rehabilitation

## 2015-09-24 ENCOUNTER — Ambulatory Visit: Payer: Medicaid Other | Admitting: Physical Medicine & Rehabilitation

## 2015-10-01 ENCOUNTER — Encounter: Payer: Medicare Other | Admitting: Physical Medicine & Rehabilitation

## 2015-10-14 ENCOUNTER — Other Ambulatory Visit: Payer: Self-pay

## 2015-10-14 DIAGNOSIS — G9520 Unspecified cord compression: Secondary | ICD-10-CM

## 2015-10-14 DIAGNOSIS — IMO0002 Reserved for concepts with insufficient information to code with codable children: Secondary | ICD-10-CM

## 2015-10-14 MED ORDER — DIAZEPAM 5 MG PO TABS: 5.0000 mg | ORAL_TABLET | Freq: Three times a day (TID) | ORAL | Status: DC | PRN

## 2015-10-14 NOTE — Telephone Encounter (Signed)
Received refill request from pharmacy. Sent in medication.

## 2015-11-03 ENCOUNTER — Encounter: Payer: Medicare Other | Attending: Physical Medicine & Rehabilitation | Admitting: Physical Medicine & Rehabilitation

## 2015-11-24 ENCOUNTER — Telehealth: Payer: Self-pay | Admitting: *Deleted

## 2015-11-24 DIAGNOSIS — IMO0002 Reserved for concepts with insufficient information to code with codable children: Secondary | ICD-10-CM

## 2015-11-24 DIAGNOSIS — IMO0001 Reserved for inherently not codable concepts without codable children: Secondary | ICD-10-CM

## 2015-11-24 DIAGNOSIS — G959 Disease of spinal cord, unspecified: Secondary | ICD-10-CM

## 2015-11-24 MED ORDER — TIZANIDINE HCL 4 MG PO TABS: 6.0000 mg | ORAL_TABLET | Freq: Four times a day (QID) | ORAL | Status: DC

## 2015-11-24 NOTE — Telephone Encounter (Signed)
Debbie [name] called to get refill on tizanidine. Sent to pharmacy.

## 2015-12-24 ENCOUNTER — Other Ambulatory Visit: Payer: Self-pay | Admitting: Physical Medicine & Rehabilitation

## 2015-12-24 DIAGNOSIS — IMO0002 Reserved for concepts with insufficient information to code with codable children: Secondary | ICD-10-CM

## 2015-12-24 MED ORDER — GABAPENTIN 300 MG PO CAPS: 600.0000 mg | ORAL_CAPSULE | Freq: Three times a day (TID) | ORAL | Status: DC

## 2015-12-24 NOTE — Telephone Encounter (Signed)
Pt aware of refill.

## 2015-12-24 NOTE — Telephone Encounter (Signed)
Gabapentin refilled

## 2015-12-24 NOTE — Telephone Encounter (Signed)
Patient needs a refill on Gabapentin. 

## 2015-12-28 ENCOUNTER — Encounter: Payer: Medicare Other | Admitting: Physical Medicine & Rehabilitation

## 2016-01-26 ENCOUNTER — Encounter: Payer: Medicare Other | Admitting: Physical Medicine & Rehabilitation

## 2016-01-26 ENCOUNTER — Telehealth: Payer: Self-pay | Admitting: *Deleted

## 2016-01-26 NOTE — Telephone Encounter (Signed)
Debbie [name] called to request refill on her diazepam and gabapentin.  She had an appt today and canceled.  In looking at her appointments, she has consistently since June 2016 scheduled appts, called for a refill and was given refills and then she would cancel appt.  She has not been seen since 05/26/15.  Per Dr Naaman Plummer, absolutely no refills and no further Rx without a visit.  I have called and left message for [name] on her personally identified voicemail, for her to call and schedule appt (openings on Monday) or she will need to get medications from another provider.

## 2016-01-27 DIAGNOSIS — E1165 Type 2 diabetes mellitus with hyperglycemia: Secondary | ICD-10-CM | POA: Diagnosis not present

## 2016-01-27 DIAGNOSIS — J208 Acute bronchitis due to other specified organisms: Secondary | ICD-10-CM | POA: Diagnosis not present

## 2016-01-27 DIAGNOSIS — I10 Essential (primary) hypertension: Secondary | ICD-10-CM | POA: Diagnosis not present

## 2016-01-31 ENCOUNTER — Encounter: Payer: Medicare Other | Admitting: Physical Medicine & Rehabilitation

## 2016-02-23 ENCOUNTER — Encounter: Payer: Medicare Other | Attending: Physical Medicine & Rehabilitation | Admitting: Physical Medicine & Rehabilitation

## 2016-02-23 ENCOUNTER — Ambulatory Visit: Payer: Medicare Other | Admitting: Physical Medicine & Rehabilitation

## 2016-02-23 ENCOUNTER — Encounter: Payer: Self-pay | Admitting: Physical Medicine & Rehabilitation

## 2016-02-23 VITALS — BP 165/106 | HR 76 | Resp 14

## 2016-02-23 DIAGNOSIS — G9589 Other specified diseases of spinal cord: Secondary | ICD-10-CM | POA: Insufficient documentation

## 2016-02-23 DIAGNOSIS — I10 Essential (primary) hypertension: Secondary | ICD-10-CM | POA: Diagnosis not present

## 2016-02-23 DIAGNOSIS — M545 Low back pain: Secondary | ICD-10-CM | POA: Diagnosis not present

## 2016-02-23 DIAGNOSIS — G959 Disease of spinal cord, unspecified: Secondary | ICD-10-CM

## 2016-02-23 DIAGNOSIS — M659 Synovitis and tenosynovitis, unspecified: Secondary | ICD-10-CM | POA: Diagnosis not present

## 2016-02-23 DIAGNOSIS — N319 Neuromuscular dysfunction of bladder, unspecified: Secondary | ICD-10-CM | POA: Diagnosis not present

## 2016-02-23 DIAGNOSIS — IMO0001 Reserved for inherently not codable concepts without codable children: Secondary | ICD-10-CM

## 2016-02-23 DIAGNOSIS — M609 Myositis, unspecified: Secondary | ICD-10-CM

## 2016-02-23 DIAGNOSIS — G8220 Paraplegia, unspecified: Secondary | ICD-10-CM | POA: Diagnosis not present

## 2016-02-23 DIAGNOSIS — G8929 Other chronic pain: Secondary | ICD-10-CM | POA: Diagnosis not present

## 2016-02-23 DIAGNOSIS — R739 Hyperglycemia, unspecified: Secondary | ICD-10-CM | POA: Insufficient documentation

## 2016-02-23 DIAGNOSIS — M791 Myalgia: Secondary | ICD-10-CM | POA: Diagnosis not present

## 2016-02-23 DIAGNOSIS — IMO0002 Reserved for concepts with insufficient information to code with codable children: Secondary | ICD-10-CM

## 2016-02-23 DIAGNOSIS — Z87891 Personal history of nicotine dependence: Secondary | ICD-10-CM | POA: Insufficient documentation

## 2016-02-23 DIAGNOSIS — M778 Other enthesopathies, not elsewhere classified: Secondary | ICD-10-CM

## 2016-02-23 DIAGNOSIS — R42 Dizziness and giddiness: Secondary | ICD-10-CM | POA: Diagnosis not present

## 2016-02-23 DIAGNOSIS — R252 Cramp and spasm: Secondary | ICD-10-CM | POA: Insufficient documentation

## 2016-02-23 DIAGNOSIS — M5104 Intervertebral disc disorders with myelopathy, thoracic region: Secondary | ICD-10-CM | POA: Insufficient documentation

## 2016-02-23 DIAGNOSIS — Z09 Encounter for follow-up examination after completed treatment for conditions other than malignant neoplasm: Secondary | ICD-10-CM | POA: Diagnosis not present

## 2016-02-23 DIAGNOSIS — G9520 Unspecified cord compression: Secondary | ICD-10-CM

## 2016-02-23 MED ORDER — GABAPENTIN 300 MG PO CAPS: 600.0000 mg | ORAL_CAPSULE | Freq: Three times a day (TID) | ORAL | Status: DC

## 2016-02-23 MED ORDER — DIAZEPAM 5 MG PO TABS: 5.0000 mg | ORAL_TABLET | Freq: Three times a day (TID) | ORAL | Status: DC | PRN

## 2016-02-23 MED ORDER — MELOXICAM 15 MG PO TABS: 15.0000 mg | ORAL_TABLET | Freq: Every day | ORAL | Status: DC

## 2016-02-23 MED ORDER — TIZANIDINE HCL 4 MG PO TABS
6.0000 mg | ORAL_TABLET | Freq: Four times a day (QID) | ORAL | Status: DC
Start: 2016-02-23 — End: 2016-09-29

## 2016-02-23 NOTE — Patient Instructions (Signed)

## 2016-02-23 NOTE — Progress Notes (Signed)
Subjective:    Patient ID: Debbie [name], female    DOB: 07-27-1976, 40 y.o.   MRN: [id number]  HPI   Debbie [name] is here in follow up of her thoracic myelopathy. She has recently moved and has reported an increase in her low back pain. Apparently, she was diagnosed with "sciatica" prior to her thoracic myelopathy. I reviewed the MRI of her low back, and it is only really notable for multilevel facet arthropathy and DDD. She has done some stretches and used motrin.  She has run out of her mobic and zanaflex i had prescribed before.  She had good results with the phenol injections, but the effects have since worn off. She has not been using her braces much or standing much because of her recent move.   Pain Inventory Average Pain 4 Pain Right Now 4 My pain is sharp, stabbing and aching  In the last 24 hours, has pain interfered with the following? General activity 5 Relation with others 5 Enjoyment of life 7 What TIME of day is your pain at its worst? morning, evening, night  Sleep (in general) Poor  Pain is worse with: some activites Pain improves with: therapy/exercise and medication Relief from Meds: fair  Mobility ability to climb steps?  no do you drive?  no use a wheelchair needs help with transfers transfers alone Do you have any goals in this area?  yes  Function Do you have any goals in this area?  no  Neuro/Psych bladder control problems bowel control problems spasms dizziness  Prior Studies Any changes since last visit?  no  Physicians involved in your care Any changes since last visit?  no   Family History  Problem Relation Age of Onset  . Multiple sclerosis Father   . Diabetes Mother    Social History   Social History  . Marital Status: Legally Separated    Spouse Name: N/A  . Number of Children: N/A  . Years of Education: N/A   Social History Main Topics  . Smoking status: Former Smoker -- 1.50 packs/day for 10 years    Types: Cigarettes   Quit date: 11/07/2012  . Smokeless tobacco: None  . Alcohol Use: Yes     Comment: occ  . Drug Use: No  . Sexual Activity: Yes    Birth Control/ Protection: None   Other Topics Concern  . None   Social History Narrative   Past Surgical History  Procedure Laterality Date  . Fracture surgery    . Cesarean section    . 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 Spine surgery     Past Medical History  Diagnosis Date  . Hypertension   . 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 Neurogenic bladder     and bowel  . Mass     T5-T6 intradural mass  . Chronic pain   . Chronic myofascial pain    BP 165/106 mmHg  Pulse 76  Resp 14  SpO2 97%  Opioid Risk Score:   Fall Risk Score:  `1  Depression screen PHQ 2/9  No flowsheet data found.    Review of Systems  Constitutional: Positive for diaphoresis.       Bladder control problems Bowel control problems   Endocrine:       High blood sugar  Neurological: Positive for dizziness.       Spasms   All other systems reviewed and are negative.      Objective:   Physical Exam  Constitutional: She is oriented to person, place, and time. She appears well-developed and well-nourished.  HENT:  Head: Normocephalic.  Neck: Normal range of motion.  Cardiovascular: Normal rate, regular rhythm and normal heart sounds.  Pulmonary/Chest: Effort normal and breath sounds normal.  At times she has experience some pleuritic pain during the night. Denies at this time.  Musculoskeletal:  Normal Muscle Bulk: Muscle Testing Reveals: Bilateral Hand Grips: 5/5 Upper extremities with Full ROM WNL Lower Extremities: Left leg flexion spasm noted. Hip adductors 1-/4. Left hamstring 1-2/4. gactrocs sporadically 2-3/4 bilaterally, left more than right. Strength  underlying is 3-4/5 Right leg. 1-2/5 LLE. Tone breaks with passive movement. Heel cords are tight, left more than right. Thoracic Paraspinals muscles tenderness  Neurological: She is alert and oriented to person, place, and time. She has normal reflexes.  Skin: Skin is warm and dry.  Psychiatric: She has a normal mood and affect.   Assessment & Plan:   1. 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 Continue Current Medication Regime. Valium, Neurontin and tiZanidine.--all refilled today  -consider bilateral phenol injectoins in the future to tibial nerves -reviewed stretching program/standing 2. Neurogenic bowel and bladder: amitriptyline hs to help with frequency and nerve pain. 25mg  qhs 3. Myofascial pain:  4. Low back pain -meloxicam refilled today -provided lumbar spine stretching exercises. Pain appears to be facet and potentially discogenic in nature. No sciatica is evident. 5. Neuropathic pain: Continue Gabapentin.   Follow up with me in 3 months. Thirty minutes of face to face patient care time were spent during this visit. All questions were encouraged and answered.

## 2016-03-21 ENCOUNTER — Telehealth: Payer: Self-pay | Admitting: *Deleted

## 2016-03-21 DIAGNOSIS — IMO0002 Reserved for concepts with insufficient information to code with codable children: Secondary | ICD-10-CM

## 2016-03-21 MED ORDER — GABAPENTIN 300 MG PO CAPS: 600.0000 mg | ORAL_CAPSULE | Freq: Three times a day (TID) | ORAL | Status: DC

## 2016-03-21 NOTE — Telephone Encounter (Signed)
Debbie [name] called for refill on her gabapentin.  Sent to pharmacy and Abigail Butts notified.

## 2016-05-18 DIAGNOSIS — E1165 Type 2 diabetes mellitus with hyperglycemia: Secondary | ICD-10-CM | POA: Diagnosis not present

## 2016-05-18 DIAGNOSIS — I10 Essential (primary) hypertension: Secondary | ICD-10-CM | POA: Diagnosis not present

## 2016-05-18 DIAGNOSIS — E784 Other hyperlipidemia: Secondary | ICD-10-CM | POA: Diagnosis not present

## 2016-05-24 ENCOUNTER — Encounter: Payer: Medicare Other | Attending: Physical Medicine & Rehabilitation | Admitting: Physical Medicine & Rehabilitation

## 2016-06-01 ENCOUNTER — Telehealth: Payer: Self-pay

## 2016-06-01 DIAGNOSIS — IMO0002 Reserved for concepts with insufficient information to code with codable children: Secondary | ICD-10-CM

## 2016-06-01 MED ORDER — GABAPENTIN 300 MG PO CAPS: 600.0000 mg | ORAL_CAPSULE | Freq: Three times a day (TID) | ORAL | Status: DC

## 2016-06-01 NOTE — Telephone Encounter (Signed)
Pt is requesting a refill on Gabapentin. Meds sent to pharmacy.

## 2016-06-14 DIAGNOSIS — I484 Atypical atrial flutter: Secondary | ICD-10-CM | POA: Diagnosis not present

## 2016-06-14 DIAGNOSIS — I2510 Atherosclerotic heart disease of native coronary artery without angina pectoris: Secondary | ICD-10-CM | POA: Diagnosis not present

## 2016-06-14 DIAGNOSIS — E119 Type 2 diabetes mellitus without complications: Secondary | ICD-10-CM | POA: Diagnosis not present

## 2016-06-14 DIAGNOSIS — I9589 Other hypotension: Secondary | ICD-10-CM | POA: Diagnosis not present

## 2016-06-19 DIAGNOSIS — I10 Essential (primary) hypertension: Secondary | ICD-10-CM | POA: Diagnosis not present

## 2016-06-19 DIAGNOSIS — E1165 Type 2 diabetes mellitus with hyperglycemia: Secondary | ICD-10-CM | POA: Diagnosis not present

## 2016-06-19 DIAGNOSIS — E784 Other hyperlipidemia: Secondary | ICD-10-CM | POA: Diagnosis not present

## 2016-06-23 DIAGNOSIS — E1165 Type 2 diabetes mellitus with hyperglycemia: Secondary | ICD-10-CM | POA: Diagnosis not present

## 2016-06-23 DIAGNOSIS — I10 Essential (primary) hypertension: Secondary | ICD-10-CM | POA: Diagnosis not present

## 2016-06-28 ENCOUNTER — Encounter: Payer: Medicare Other | Attending: Physical Medicine & Rehabilitation | Admitting: Physical Medicine & Rehabilitation

## 2016-06-28 ENCOUNTER — Encounter: Payer: Self-pay | Admitting: Physical Medicine & Rehabilitation

## 2016-06-28 VITALS — BP 144/95 | HR 65 | Resp 12

## 2016-06-28 DIAGNOSIS — M545 Low back pain: Secondary | ICD-10-CM | POA: Insufficient documentation

## 2016-06-28 DIAGNOSIS — G8220 Paraplegia, unspecified: Secondary | ICD-10-CM | POA: Diagnosis not present

## 2016-06-28 DIAGNOSIS — Z79899 Other long term (current) drug therapy: Secondary | ICD-10-CM | POA: Diagnosis not present

## 2016-06-28 DIAGNOSIS — IMO0002 Reserved for concepts with insufficient information to code with codable children: Secondary | ICD-10-CM

## 2016-06-28 DIAGNOSIS — G959 Disease of spinal cord, unspecified: Secondary | ICD-10-CM

## 2016-06-28 DIAGNOSIS — N319 Neuromuscular dysfunction of bladder, unspecified: Secondary | ICD-10-CM | POA: Insufficient documentation

## 2016-06-28 NOTE — Patient Instructions (Signed)
CONTACT DR. POOL ABOUT FOLLOW UP MRI    PLEASE CALL ME WITH ANY PROBLEMS OR QUESTIONS [phone])

## 2016-06-28 NOTE — Progress Notes (Signed)
Subjective:    Patient ID: Debbie [name], female    DOB: 01-Dec-1976, 40 y.o.   MRN: [id number]  HPI   Leonila is here in follow up of her myelopathy and associated deficits. She thinks that she "pulled" a muscle in her left buttock when she was twisting to put on some clothes on. The pain starts in the center of the buttock and runs down her leg to foot.   She feels that there is increased tightness in her left calf/knee which makes it difficult for her to stand. She also thinks that her KAFO's needs new locks as the locks have given out on a couple taken. She has a drop lock system.      Pain Inventory Average Pain 4 Pain Right Now 1 My pain is sharp, dull and aching  In the last 24 hours, has pain interfered with the following? General activity 6 Relation with others 10 Enjoyment of life 10 What TIME of day is your pain at its worst? morning and night Sleep (in general) Poor  Pain is worse with: sitting Pain improves with: medication Relief from Meds: 4  Mobility ability to climb steps?  no do you drive?  no use a wheelchair transfers alone  Function I need assistance with the following:  bathing Do you have any goals in this area?  yes  Neuro/Psych bladder control problems bowel control problems spasms depression  Prior Studies Any changes since last visit?  no  Physicians involved in your care Any changes since last visit?  no   Family History  Problem Relation Age of Onset  . Multiple sclerosis Father   . Diabetes Mother    Social History   Social History  . Marital status: Legally Separated    Spouse name: N/A  . Number of children: N/A  . Years of education: N/A   Social History Main Topics  . Smoking status: Former Smoker    Packs/day: 1.50    Years: 10.00    Types: Cigarettes    Quit date: 11/07/2012  . Smokeless tobacco: Never Used  . Alcohol use Yes     Comment: occ  . Drug use: No  . Sexual activity: Yes    Birth control/  protection: None   Other Topics Concern  . Not on file   Social History Narrative  . No narrative on file   Past Surgical History:  Procedure Laterality Date  . CESAREAN SECTION    . FRACTURE SURGERY    . 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 SPINE SURGERY     Past Medical History:  Diagnosis Date  . Chronic myofascial pain   . Chronic pain   . Hypertension   . Mass    T5-T6 intradural mass  . Neurogenic bladder    and bowel  . 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  Opioid Risk Score:   Fall Risk Score:  `1  Depression screen PHQ 2/9  No flowsheet data found.   Review of Systems  Constitutional: Negative.   HENT: Negative.   Eyes: Negative.   Respiratory: Negative.   Cardiovascular: Negative.   Gastrointestinal: Negative.   Endocrine: Negative.   Genitourinary: Negative.   Musculoskeletal: Negative.   Skin: Negative.   Allergic/Immunologic: Negative.  Neurological: Negative.   Hematological: Negative.   Psychiatric/Behavioral: Negative.        Objective:   Physical Exam       Constitutional: She is oriented to person, place, and time. She appears well-developed and well-nourished.  HENT:  Head: Normocephalic.  Neck: Normal range of motion.  Cardiovascular: Normal rate, regular rhythm and normal heart sounds.  Pulmonary/Chest: Effort normal and breath sounds normal.  At times she has experience some pleuritic pain during the night. Denies at this time.  Musculoskeletal:  Left glute tender--no palpable masses. Generalized low back tenderness to palpation. SST equivocal on the left Bilateral Hand Grips: 5/5 Upper extremities with Full ROM WNL Lower Extremities: Left leg flexion spasm noted. Hip adductors 1-/4. Left  hamstring 1-2/4. gactrocs  2-3/4 bilaterally, left more than right. Strength underlying is 3-4/5 Right leg. 1-2/5 LLE.  Sustained clonus LLE. Heel cords are tight, left more than right. PROM left achilles to -15 degrees. -5 right.   Thoracic Paraspinals muscles tenderness  Neurological: She is alert and oriented to person, place, and time. She has normal reflexes.  Skin: Skin is warm and dry.  Psychiatric: She has a normal mood and affect.   Assessment & Plan:   1. 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 Continue Current Medication Regime. Valium, Neurontin and tiZanidine.--all refilled today  -set up for bilateral phenol injectoins to tibial nerves -reviewed stretching program/standing  2. Neurogenic bowel and bladder: amitriptyline hs to help with frequency and nerve pain. 25mg  qhs 3. Myofascial pain:  4. Low back pain -meloxicam  -sciatica left sided---provided piriformis stretches -consider lumbar MRI if symptoms worsen. 5. Neuropathic pain: Continue Gabapentin. 6. NS follow up---needs to contact Dr. Annette Stable -?f/u MRI  -Piriformis stretches were provided.   Follow up with me for phenol. . 15 minutes of face to face patient care time were spent during this visit. All questions were encouraged and answered.

## 2016-07-27 ENCOUNTER — Other Ambulatory Visit: Payer: Self-pay | Admitting: Physical Medicine & Rehabilitation

## 2016-07-27 DIAGNOSIS — IMO0002 Reserved for concepts with insufficient information to code with codable children: Secondary | ICD-10-CM

## 2016-07-31 ENCOUNTER — Encounter: Payer: Medicare Other | Attending: Physical Medicine & Rehabilitation | Admitting: Physical Medicine & Rehabilitation

## 2016-07-31 ENCOUNTER — Encounter: Payer: Self-pay | Admitting: Physical Medicine & Rehabilitation

## 2016-07-31 VITALS — BP 123/84 | HR 69 | Resp 16

## 2016-07-31 DIAGNOSIS — Z79899 Other long term (current) drug therapy: Secondary | ICD-10-CM | POA: Diagnosis not present

## 2016-07-31 DIAGNOSIS — G8220 Paraplegia, unspecified: Secondary | ICD-10-CM | POA: Insufficient documentation

## 2016-07-31 DIAGNOSIS — M545 Low back pain: Secondary | ICD-10-CM | POA: Insufficient documentation

## 2016-07-31 DIAGNOSIS — N319 Neuromuscular dysfunction of bladder, unspecified: Secondary | ICD-10-CM | POA: Insufficient documentation

## 2016-07-31 DIAGNOSIS — IMO0002 Reserved for concepts with insufficient information to code with codable children: Secondary | ICD-10-CM

## 2016-07-31 NOTE — Progress Notes (Signed)
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  Informed consent was obtained after describing the risks and benefits of the procedure with the patient this includes bleeding bruising and infection as well as medication side effects. The patient elected to proceed and has given written consent. Patient placed in a prone position on the exam table. External DC stimulation was applied to the popliteal space using a nerve stimulator. Plantar flexion twitch was obtained. The popliteal region was prepped with Betadine and then entered with a 22-gauge 40 mm needle electrode under electrical stimulation guidance. Plantar flexion which was obtained and confirmed. Then 4 cc of 5% phenol were injected. The patient tolerated procedure well. Post procedure instructions and followup visit were given.  I also wrote a prescription for bilateral KAFO's which the patient will bring to [facility].  She'll follow up with me in 3 months.

## 2016-07-31 NOTE — Patient Instructions (Signed)
PLEASE CALL ME WITH ANY PROBLEMS OR QUESTIONS ([phone])  

## 2016-08-08 DIAGNOSIS — I10 Essential (primary) hypertension: Secondary | ICD-10-CM | POA: Diagnosis not present

## 2016-08-08 DIAGNOSIS — E784 Other hyperlipidemia: Secondary | ICD-10-CM | POA: Diagnosis not present

## 2016-08-08 DIAGNOSIS — E1165 Type 2 diabetes mellitus with hyperglycemia: Secondary | ICD-10-CM | POA: Diagnosis not present

## 2016-09-21 DIAGNOSIS — E1165 Type 2 diabetes mellitus with hyperglycemia: Secondary | ICD-10-CM | POA: Diagnosis not present

## 2016-09-21 DIAGNOSIS — Z0000 Encounter for general adult medical examination without abnormal findings: Secondary | ICD-10-CM | POA: Diagnosis not present

## 2016-09-21 DIAGNOSIS — I10 Essential (primary) hypertension: Secondary | ICD-10-CM | POA: Diagnosis not present

## 2016-09-21 DIAGNOSIS — Z1389 Encounter for screening for other disorder: Secondary | ICD-10-CM | POA: Diagnosis not present

## 2016-09-27 ENCOUNTER — Other Ambulatory Visit: Payer: Self-pay | Admitting: Physical Medicine & Rehabilitation

## 2016-09-27 DIAGNOSIS — IMO0002 Reserved for concepts with insufficient information to code with codable children: Secondary | ICD-10-CM

## 2016-09-29 ENCOUNTER — Other Ambulatory Visit: Payer: Self-pay | Admitting: Physical Medicine & Rehabilitation

## 2016-09-29 DIAGNOSIS — G959 Disease of spinal cord, unspecified: Secondary | ICD-10-CM

## 2016-09-29 DIAGNOSIS — IMO0002 Reserved for concepts with insufficient information to code with codable children: Secondary | ICD-10-CM

## 2016-10-31 ENCOUNTER — Encounter: Payer: Medicare Other | Admitting: Physical Medicine & Rehabilitation

## 2016-10-31 ENCOUNTER — Telehealth: Payer: Self-pay | Admitting: *Deleted

## 2016-10-31 DIAGNOSIS — G9520 Unspecified cord compression: Secondary | ICD-10-CM

## 2016-10-31 DIAGNOSIS — IMO0002 Reserved for concepts with insufficient information to code with codable children: Secondary | ICD-10-CM

## 2016-10-31 NOTE — Telephone Encounter (Signed)
Patient left a message stating that her pharmacy sent a request over a couple weeks ago asking for a refill on diazepam.  She had an appointment today and it was cancelled due to illness. Please advise on refill

## 2016-11-01 MED ORDER — DIAZEPAM 5 MG PO TABS: 5.0000 mg | ORAL_TABLET | Freq: Three times a day (TID) | ORAL | 3 refills | Status: DC | PRN

## 2016-11-01 NOTE — Telephone Encounter (Signed)
Rx phoned in, left voicemail for patient

## 2016-11-01 NOTE — Telephone Encounter (Signed)
May fill 

## 2016-11-30 ENCOUNTER — Other Ambulatory Visit: Payer: Self-pay | Admitting: Physical Medicine & Rehabilitation

## 2016-11-30 DIAGNOSIS — IMO0002 Reserved for concepts with insufficient information to code with codable children: Secondary | ICD-10-CM

## 2016-12-12 ENCOUNTER — Encounter: Payer: Medicare Other | Attending: Physical Medicine & Rehabilitation | Admitting: Physical Medicine & Rehabilitation

## 2017-01-03 ENCOUNTER — Telehealth: Payer: Self-pay | Admitting: *Deleted

## 2017-01-03 ENCOUNTER — Encounter: Payer: Medicare Other | Admitting: Physical Medicine & Rehabilitation

## 2017-01-03 NOTE — Telephone Encounter (Signed)
No showed for appt today. Discharged due to frequent no shows and cancellations

## 2017-01-29 DIAGNOSIS — R1084 Generalized abdominal pain: Secondary | ICD-10-CM | POA: Diagnosis not present

## 2017-01-29 DIAGNOSIS — E1165 Type 2 diabetes mellitus with hyperglycemia: Secondary | ICD-10-CM | POA: Diagnosis not present

## 2017-01-29 DIAGNOSIS — I10 Essential (primary) hypertension: Secondary | ICD-10-CM | POA: Diagnosis not present

## 2017-02-19 ENCOUNTER — Telehealth: Payer: Self-pay | Admitting: *Deleted

## 2017-02-19 ENCOUNTER — Ambulatory Visit: Payer: Medicare Other | Admitting: Neurology

## 2017-02-19 NOTE — Telephone Encounter (Signed)
No showed new patient appointment. 

## 2017-02-20 ENCOUNTER — Encounter: Payer: Self-pay | Admitting: Neurology

## 2017-03-29 ENCOUNTER — Encounter: Payer: Self-pay | Admitting: Neurology

## 2017-03-29 ENCOUNTER — Ambulatory Visit (INDEPENDENT_AMBULATORY_CARE_PROVIDER_SITE_OTHER): Payer: Medicare Other | Admitting: Neurology

## 2017-03-29 ENCOUNTER — Encounter (INDEPENDENT_AMBULATORY_CARE_PROVIDER_SITE_OTHER): Payer: Self-pay

## 2017-03-29 VITALS — BP 98/61 | HR 67 | Ht 68.5 in

## 2017-03-29 DIAGNOSIS — G8220 Paraplegia, unspecified: Secondary | ICD-10-CM

## 2017-03-29 MED ORDER — BACLOFEN 10 MG PO TABS: 5.0000 mg | ORAL_TABLET | Freq: Three times a day (TID) | ORAL | 2 refills | Status: DC

## 2017-03-29 NOTE — Patient Instructions (Signed)
   We will start baclofen 10 mg taking 1/2 tablet three times a day.

## 2017-03-29 NOTE — Progress Notes (Signed)
Reason for visit: Paraparesis  Referring physician: Dr. Grier Mitts Debbie [name] is a 41 y.o. 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 The patient has numbness of the right leg and weakness of the left leg. The patient has spasticity involving both legs, she will have spasms with either extension or flexion of the lower extremities, ankle clonus is also noted. The patient had been followed by Dr. Naaman Plummer, but she was discharged from the practice for multiple no shows. The patient is on diazepam and tizanidine for spasticity. She takes the tizanidine on a scheduled basis and the diazepam mainly as needed, she usually will take 5 mg in the evening. She claims that she has never been on baclofen. The patient had been getting Botox injections into the gastrocnemius muscles which has been helpful. The patient does have some discomfort with spasms. She has urinary and fecal incontinence, she wears adult diapers. She has good function of the upper extremities. She returns to the office today for an evaluation.  Past Medical History:  Diagnosis Date  . Chronic myofascial pain   . Chronic pain   . Hypertension   . Mass    T5-T6 intradural mass  . Neurogenic bladder    and bowel  . Spastic paraplegia     Past Surgical History:  Procedure Laterality Date  . CESAREAN SECTION    . FRACTURE SURGERY    . LAMINECTOMY  11/11/2012   Procedure: THORACIC LAMINECTOMY FOR TUMOR;  Surgeon: Charlie Pitter, MD;  Location: [facility];  Service: Neurosurgery;  Laterality: N/A;  thracic laminectomy for intradural and intramedullary neoplasm  . SPINE SURGERY      Family History  Problem Relation Age of Onset  . Multiple sclerosis Father   . Diabetes Mother     Social history:  reports that she quit  smoking about 4 years ago. Her smoking use included Cigarettes. She has a 15.00 pack-year smoking history. She has never used smokeless tobacco. She reports that she does not drink alcohol or use drugs.  Medications:  Prior to Admission medications   Medication Sig Start Date End Date Taking? Authorizing Provider  atorvastatin (LIPITOR) 20 MG tablet Take 20 mg by mouth daily.   Yes Historical Provider, MD  diazepam (VALIUM) 5 MG tablet Take 1 tablet (5 mg total) by mouth every 8 (eight) hours as needed (spasm). 11/01/16  Yes Meredith Staggers, MD  gabapentin (NEURONTIN) 300 MG capsule take 2 capsules by mouth three times a day 11/30/16  Yes Meredith Staggers, MD  hydrochlorothiazide (MICROZIDE) 12.5 MG capsule Take 1 capsule (12.5 mg total) by mouth daily. 07/02/13  Yes Meredith Staggers, MD  lisinopril (PRINIVIL,ZESTRIL) 40 MG tablet Take 1 tablet (40 mg total) by mouth daily. 12/06/12  Yes Daniel J Angiulli, PA-C  meloxicam (MOBIC) 15 MG tablet Take 1 tablet (15 mg total) by mouth daily. 02/23/16  Yes Meredith Staggers, MD  metFORMIN (GLUCOPHAGE) 500 MG tablet Take 500 mg by mouth daily with breakfast.   Yes Historical Provider, MD  pantoprazole (PROTONIX) 40 MG tablet Take 1 tablet (40 mg total) by mouth daily. 09/19/13  Yes Meredith Staggers, MD  tiZANidine (ZANAFLEX) 4 MG tablet TAKE 1.5 TABLETS BY MOUTH 4 TIMES DAILY. 09/29/16  Yes Alroy Dust T  Naaman Plummer, MD      Allergies  Allergen Reactions  . Honey Bee Treatment [Bee Venom] Anaphylaxis and Itching    ROS:  Out of a complete 14 system review of symptoms, the patient complains only of the following symptoms, and all other reviewed systems are negative.  Weight gain Swelling in the legs Blurred vision Achy muscles  Blood pressure 98/61, pulse 67, height 5' 8.5" (1.74 m), SpO2 96 %.  Physical Exam  General: The patient is alert and cooperative at the time of the examination. The patient is markedly obese.  Eyes: Pupils are equal, round,  and reactive to light. Discs are flat bilaterally.  Neck: The neck is supple, no carotid bruits are noted.  Respiratory: The respiratory examination is clear.  Cardiovascular: The cardiovascular examination reveals a regular rate and rhythm, no obvious murmurs or rubs are noted.  Skin: Extremities are with 2+ edema below the knees bilaterally.  Neurologic Exam  Mental status: The patient is alert and oriented x 3 at the time of the examination. The patient has apparent normal recent and remote memory, with an apparently normal attention span and concentration ability.  Cranial nerves: Facial symmetry is present. There is good sensation of the face to pinprick and soft touch bilaterally. The strength of the facial muscles and the muscles to head turning and shoulder shrug are normal bilaterally. Speech is well enunciated, no aphasia or dysarthria is noted. Extraocular movements are full. Visual fields are full. The tongue is midline, and the patient has symmetric elevation of the soft palate. No obvious hearing deficits are noted.  Motor: The motor testing reveals 5 over 5 strength of the upper extremities. Good symmetric motor tone is noted in the arms. With the lower extremities, there is 3/5 strength with hip flexion, 4 minus/5 strength with the action and extension and with dorsiflexion of the foot on the right. On the left, there is 1/5 strength with hip flexion, 2/5 strength with knee flexion extension and dorsiflexion of the foot.  Sensory: Sensory testing is intact to pinprick, soft touch, vibration sensation, and position sense on the upper extremities. With the lower extremities, the patient has decreased pinprick sensation on the right leg as compared to the left with a sensory level up to the T10 level on the right. Vibration sensation appears to be decreased on the left leg, present on the right, but position sense is decreased on the right leg and present on the left.  Coordination:  Cerebellar testing reveals good finger-nose-finger bilaterally. The patient is unable to perform heel-to-shin on either side.  Gait and station: Gait could not be tested, the patient is wheelchair-bound.  Reflexes: Deep tendon reflexes are symmetric and normal bilaterally in the arms. Reflexes are depressed in the left leg, present on the right, there is sustained ankle clonus on the left, 5 or 6 beats of ankle clonus on the right. Toes are upgoing bilaterally.   MRI thoracic 11/08/12:  IMPRESSION:  1.  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  Little if any enhancement.  Associated spinal cord expansion and mild cord edema.   No associated spinal dysraphism identified. 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  CT thoracic spine without contrast might be valuable to evaluate for any calcification within the mass (which would strongly indicate a dermoid). 2.  No other thoracic spine abnormality.  Assessment/Plan:  1. History of lipoma with thoracic myelopathy, spastic paraparesis  The patient does have significant issues with spasticity of the legs, she takes tizanidine on a scheduled basis, and diazepam on average 5 mg at night. We will add baclofen taking 5 mg twice a day and gradually go to 5 mg 3 times daily. She will be set up to see Dr. Krista Blue for consideration of Botox injections in the legs. He will follow-up with me in 4 months. A repeat MRI of the thoracic spine will be done. The patient indicates that there has been some recurrence of the lipoma on her last MRI evaluation.  Jill Alexanders MD 03/29/2017 11:37 AM  [facility] [address] [place], [place] [address]  Phone [phone] Fax [phone]

## 2017-04-13 ENCOUNTER — Inpatient Hospital Stay: Admission: RE | Admit: 2017-04-13 | Payer: Medicare Other | Source: Ambulatory Visit

## 2017-04-13 DIAGNOSIS — J218 Acute bronchiolitis due to other specified organisms: Secondary | ICD-10-CM | POA: Diagnosis not present

## 2017-04-17 ENCOUNTER — Other Ambulatory Visit: Payer: Self-pay | Admitting: Physical Medicine & Rehabilitation

## 2017-04-17 DIAGNOSIS — G959 Disease of spinal cord, unspecified: Secondary | ICD-10-CM

## 2017-06-28 DIAGNOSIS — I10 Essential (primary) hypertension: Secondary | ICD-10-CM | POA: Diagnosis not present

## 2017-06-28 DIAGNOSIS — G8222 Paraplegia, incomplete: Secondary | ICD-10-CM | POA: Diagnosis not present

## 2017-06-28 DIAGNOSIS — E119 Type 2 diabetes mellitus without complications: Secondary | ICD-10-CM | POA: Diagnosis not present

## 2017-06-28 DIAGNOSIS — E1165 Type 2 diabetes mellitus with hyperglycemia: Secondary | ICD-10-CM | POA: Diagnosis not present

## 2017-07-13 ENCOUNTER — Ambulatory Visit
Admission: RE | Admit: 2017-07-13 | Discharge: 2017-07-13 | Disposition: A | Discharge status: Home / Self Care | Payer: Medicare Other | Source: Ambulatory Visit | Attending: Neurology | Admitting: Neurology

## 2017-07-13 DIAGNOSIS — M5124 Other intervertebral disc displacement, thoracic region: Secondary | ICD-10-CM | POA: Diagnosis not present

## 2017-07-13 DIAGNOSIS — G8220 Paraplegia, unspecified: Secondary | ICD-10-CM

## 2017-07-15 ENCOUNTER — Telehealth: Payer: Self-pay | Admitting: Neurology

## 2017-07-15 NOTE — Telephone Encounter (Signed)
I called patient. MRI of the thoracic spine shows what appears to be a lipoma that is unchanged in 2015. The patient does have a spastic paraparesis associated with this lesion. The patient has had prior laminectomy surgery.    MRI thoracic 07/13/17:  IMPRESSION:  This MRI of the thoracic spine without contrast shows the following: 1.    37109 mm mass with signal characteristics consistent with a lipoma.  It is adjacent to T5 through T6-T7.   It is similar in appearance and size when compared to the MRI dated 01/07/2014.   There have been prior laminectomies from T4 through T8. 2.    No significant degenerative changes.

## 2017-07-19 NOTE — Telephone Encounter (Signed)
Patient called office returning Dr. Jannifer Franklin' call in reference to MRI results.  Please call

## 2017-07-19 NOTE — Telephone Encounter (Signed)
I called patient. The MRI of the thoracic spine is unchanged, the lipoma appears to be within the spinal cord, this is why could not be resected by Dr. Vertell Limber.  Patient functional has not had changes, she mainly has had some problems with spasticity, she could not tolerate baclofen secondary to drowsiness and she had to stop this. She is on tizanidine and diazepam.  We will see her next week on August 22.

## 2017-07-25 ENCOUNTER — Ambulatory Visit (INDEPENDENT_AMBULATORY_CARE_PROVIDER_SITE_OTHER): Payer: Medicare Other | Admitting: Neurology

## 2017-07-25 ENCOUNTER — Encounter: Payer: Self-pay | Admitting: Neurology

## 2017-07-25 VITALS — BP 78/45 | HR 56 | Ht 68.5 in

## 2017-07-25 DIAGNOSIS — G8220 Paraplegia, unspecified: Secondary | ICD-10-CM | POA: Diagnosis not present

## 2017-07-25 DIAGNOSIS — G959 Disease of spinal cord, unspecified: Secondary | ICD-10-CM | POA: Diagnosis not present

## 2017-07-25 NOTE — Progress Notes (Signed)
Reason for visit: Paraparesis  Debbie [name] is an 41 y.o. 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 The patient apparently was ambulatory prior to her decompressive surgery, she has had a paraparesis since that time. She has a neurogenic bowel and bladder issue. She is essentially nonambulatory. The patient has significant spasticity of the lower extremities. The patient has had a recent MRI of the thoracic spine shows no real change from the prior study done in 2015. The patient is not tolerant of baclofen, she is on diazepam and tizanidine but she is still having significant issues with leg spasticity. She notes that her legs will jerk or twitch at night. She does have some early skin changes at times on her buttocks from sitting too long. She uses a motorized wheelchair for mobility. The patient comes to this office for further evaluation.  Past Medical History:  Diagnosis Date  . Chronic myofascial pain   . Chronic pain   . Hypertension   . Mass    T5-T6 intradural mass  . Neurogenic bladder    and bowel  . Spastic paraplegia     Past Surgical History:  Procedure Laterality Date  . CESAREAN SECTION    . FRACTURE SURGERY    . LAMINECTOMY  11/11/2012   Procedure: THORACIC LAMINECTOMY FOR TUMOR;  Surgeon: Charlie Pitter, MD;  Location: [facility];  Service: Neurosurgery;  Laterality: N/A;  thracic laminectomy for intradural and intramedullary neoplasm  . SPINE SURGERY      Family History  Problem Relation Age of Onset  . Multiple sclerosis Father   . Diabetes Mother     Social history:  reports that she quit smoking about 4 years ago. Her smoking use included Cigarettes. She has a 15.00 pack-year smoking history. She has never used smokeless tobacco. She reports that she does not drink alcohol or use drugs.    Allergies  Allergen Reactions  . Honey Bee Treatment [Bee Venom]  Anaphylaxis and Itching    Medications:  Prior to Admission medications   Medication Sig Start Date End Date Taking? Authorizing Provider  atorvastatin (LIPITOR) 20 MG tablet Take 20 mg by mouth daily.   Yes [provider]  diazepam (VALIUM) 5 MG tablet Take 1 tablet (5 mg total) by mouth every 8 (eight) hours as needed (spasm). 11/01/16  Yes Meredith Staggers, MD  gabapentin (NEURONTIN) 300 MG capsule take 2 capsules by mouth three times a day 11/30/16  Yes Meredith Staggers, MD  hydrochlorothiazide (MICROZIDE) 12.5 MG capsule Take 1 capsule (12.5 mg total) by mouth daily. 07/02/13  Yes Meredith Staggers, MD  lisinopril (PRINIVIL,ZESTRIL) 40 MG tablet Take 1 tablet (40 mg total) by mouth daily. 12/06/12  Yes Angiulli, Lavon Paganini, PA-C  meloxicam (MOBIC) 15 MG tablet Take 1 tablet (15 mg total) by mouth daily. 02/23/16  Yes Meredith Staggers, MD  metFORMIN (GLUCOPHAGE) 500 MG tablet Take 500 mg by mouth daily with breakfast.   Yes [provider]  pantoprazole (PROTONIX) 40 MG tablet Take 1 tablet (40 mg total) by mouth daily. 09/19/13  Yes Meredith Staggers, MD  tiZANidine (ZANAFLEX) 4 MG tablet TAKE 1.5 TABLETS BY MOUTH 4 TIMES DAILY. 09/29/16  Yes Meredith Staggers, MD    ROS:  Out of a complete 14 system review of symptoms, the patient complains only of the following symptoms, and all other  reviewed systems are negative.  Chills Blurred vision dizziness   Blood pressure (!) 85/70.  Physical Exam  General: The patient is alert and cooperative at the time of the examination. The patient is moderately to markedly obese.  Skin: 1-2+ edema below the knees is noted bilaterally.   Neurologic Exam  Mental status: The patient is alert and oriented x 3 at the time of the examination. The patient has apparent normal recent and remote memory, with an apparently normal attention span and concentration ability.   Cranial nerves: Facial symmetry is present. Speech is normal,  no aphasia or dysarthria is noted. Extraocular movements are full. Visual fields are full.  Motor: The patient has good strength in the upper  extremities.With the lower extremities, the patient has some ability to dorsiflex the right foot, otherwise no significant voluntary motor strength is noted. There is increased tone in both legs.  Sensory examination: Soft touch sensation is symmetric on the face and arms, decreased soft touch sensation on both legs, right greater than left.  Coordination: The patient has good finger-nose-finger bilaterally.The patient is not able to perform heel-to-shin on either side.  Gait and station: The patient is unable to ambulate, she is wheelchair-bound.  Reflexes: Deep tendon reflexes are symmetric In the arms, the patient has some elevation in reflexes in the legs, sustained ankle clonus bilaterally.    MRI thoracic 07/13/17:  IMPRESSION: This MRI of the thoracic spine without contrast shows the following: 1. 37109 mm mass with signal characteristics consistent with a lipoma. It is adjacent to T5 through T6-T7. It is similar in appearance and size when compared to the MRI dated 01/07/2014. There have been prior laminectomies from T4 through T8. 2. No significant degenerative changes.  * MRI scan images were reviewed online. I agree with the written report.    Assessment/Plan:  1. Spastic paraparesis   2. 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 The patient will be following up through this office on 08/14/2017 for an evaluation for Botox injections on the legs. The patient will follow-up with me in about 6 months.   Jill Alexanders MD 07/25/2017 10:58 AM  [facility] [address] [place], [place] [address]  Phone [phone] Fax [phone]

## 2017-08-09 DIAGNOSIS — N3001 Acute cystitis with hematuria: Secondary | ICD-10-CM | POA: Diagnosis not present

## 2017-08-09 DIAGNOSIS — G8222 Paraplegia, incomplete: Secondary | ICD-10-CM | POA: Diagnosis not present

## 2017-08-14 ENCOUNTER — Encounter: Payer: Self-pay | Admitting: Neurology

## 2017-08-14 ENCOUNTER — Ambulatory Visit (INDEPENDENT_AMBULATORY_CARE_PROVIDER_SITE_OTHER): Payer: Medicare Other | Admitting: Neurology

## 2017-08-14 VITALS — BP 82/42 | HR 52

## 2017-08-14 DIAGNOSIS — G8220 Paraplegia, unspecified: Secondary | ICD-10-CM | POA: Diagnosis not present

## 2017-08-14 DIAGNOSIS — G839 Paralytic syndrome, unspecified: Secondary | ICD-10-CM | POA: Diagnosis not present

## 2017-08-14 DIAGNOSIS — G959 Disease of spinal cord, unspecified: Secondary | ICD-10-CM | POA: Diagnosis not present

## 2017-08-14 NOTE — Progress Notes (Signed)
PATIENT: Debbie [name] DOB: 04/17/1976  Chief Complaint  Patient presents with  . Spastic Paraplegia    She is here with her husband, [name], for an evaluation for Botox treatment (referred by Dr. Jannifer Franklin).     HISTORICAL  Debbie [name] is a 41 years old female, accompanied by her husband, seen in refer by Dr. Jannifer Franklin for evaluation of EMG guided botulism toxin injection for bilateral lower extremity spasticity, initial evaluation was on August 14 2017.  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  She complains of significant lower extremity,legs jerking at night time, despite polypharmacy treatment of Valium 5 mg as needed, gabapentin 300 mg 2 tablets 3 times a day, meloxicam, tizanidine.  Especially when she was fitted for ankle brace, she tends to hold bilateral foot and ankle plantarflexion, difficult to put her feet flat on the floor,  She was seen by rehabilitation physician Dr. Naaman Plummer in the past, initially receiving Botox injection, reviewed previous injection note in 2006, she reported good result was 400 units of Botox injection,  to alleviate her tendon contraction, she began to receive 5% phenol injection, to bilateral toe flexors, tibialis posterior, last injection was July 31 2016   She has missed a follow-up visit, now complains of worsening ankle plantarflexion, in her most recent lower extremity  mold, she has apparent ankle plantarflexion, she to receive Botox injection, then get new lower extremity mold to help her bearing weight,  REVIEW OF SYSTEMS: Full 14 system review of systems performed and notable only for as above  ALLERGIES: Allergies  Allergen Reactions  . Honey Bee Treatment [Bee Venom] Anaphylaxis and Itching  . Baclofen     Headaches     HOME MEDICATIONS: Current Outpatient Prescriptions  Medication Sig Dispense Refill  . atorvastatin (LIPITOR) 20 MG tablet Take 20 mg by mouth daily.    . chlorthalidone (HYGROTON) 25 MG tablet Take 25 mg by mouth daily.    . diazepam (VALIUM) 5 MG tablet Take 1 tablet (5 mg total) by mouth every 8 (eight) hours as needed (spasm). 90 tablet 3  . gabapentin (NEURONTIN) 300 MG capsule take 2 capsules by mouth three times a day 180 capsule 1  . lisinopril (PRINIVIL,ZESTRIL) 40 MG tablet Take 1 tablet (40 mg total) by mouth daily. 30 tablet 1  . meloxicam (MOBIC) 15 MG tablet Take 1 tablet (15 mg total) by mouth daily. 30 tablet 4  . metFORMIN (GLUCOPHAGE) 500 MG tablet Take 500 mg by mouth daily with breakfast.    . pantoprazole (PROTONIX) 40 MG tablet Take 1 tablet (40 mg total) by mouth daily. 30 tablet 5  . tiZANidine (ZANAFLEX) 4 MG tablet TAKE 1.5 TABLETS BY MOUTH 4 TIMES DAILY. 180 tablet 5   No current facility-administered medications for this visit.     PAST MEDICAL HISTORY: Past Medical History:  Diagnosis Date  . Chronic myofascial pain   . Chronic pain   . Hypertension   .  Mass    T5-T6 intradural mass  . Neurogenic bladder    and bowel  . Spastic paraplegia     PAST SURGICAL HISTORY: Past Surgical History:  Procedure Laterality Date  . CESAREAN SECTION    . FRACTURE SURGERY    . LAMINECTOMY  11/11/2012   Procedure: THORACIC LAMINECTOMY FOR TUMOR;  Surgeon: Charlie Pitter, MD;  Location: [place] [facility];  Service: Neurosurgery;  Laterality: N/A;  thracic laminectomy for intradural and intramedullary neoplasm  .  SPINE SURGERY      FAMILY HISTORY: Family History  Problem Relation Age of Onset  . Multiple sclerosis Father   . Diabetes Mother     SOCIAL HISTORY:  Social History   Social History  . Marital status: Legally Separated    Spouse name: N/A  . Number of children: 5  . Years of education: Some college   Occupational History  . Not on file.   Social History Main Topics  . Smoking status: Former Smoker    Packs/day: 1.50    Years: 10.00    Types: Cigarettes    Quit date: 11/07/2012  . Smokeless tobacco: Never Used  . Alcohol use No  . Drug use: No  . Sexual activity: Yes    Birth control/ protection: None   Other Topics Concern  . Not on file   Social History Narrative   Lives with husband and children   Caffeine use: soda daily        PHYSICAL EXAM   Vitals:   08/14/17 1352  BP: (!) 82/42  Pulse: (!) 52    Not recorded      There is no height or weight on file to calculate BMI.  PHYSICAL EXAMNIATION:  Gen: NAD, conversant, well nourised, obese, well groomed                     Cardiovascular: Regular rate rhythm, no peripheral edema, warm, nontender. Eyes: Conjunctivae clear without exudates or hemorrhage Neck: Supple, no carotid bruits. Pulmonary: Clear to auscultation bilaterally   NEUROLOGICAL EXAM:  MENTAL STATUS: Speech:    Speech is normal; fluent and spontaneous with normal comprehension.  Cognition:     Orientation to time, place and person     Normal recent and remote memory     Normal Attention span and concentration     Normal Language, naming, repeating,spontaneous speech     Fund of knowledge   CRANIAL NERVES: CN II: Visual fields are full to confrontation. Fundoscopic exam is normal with sharp discs and no vascular changes. Pupils are round equal and briskly reactive to light. CN III, IV, VI: extraocular movement are normal. No ptosis. CN V: Facial sensation is intact to pinprick in all 3 divisions bilaterally. Corneal  responses are intact.  CN VII: Face is symmetric with normal eye closure and smile. CN VIII: Hearing is normal to rubbing fingers CN IX, X: Palate elevates symmetrically. Phonation is normal. CN XI: Head turning and shoulder shrug are intact CN XII: Tongue is midline with normal movements and no atrophy.  MOTOR: There is no pronator drift of out-stretched arms. Muscle bulk and tone are normal at bilateral upper extremity, there is only mild bilateral hip flexion but no antigravity movement bilaterally, right lower extremity has 3/5 knee extension, no significant ankle movement,  REFLEXES: Reflexes are 2+ and symmetric at the biceps, triceps, hyperreflexia at bilateral lower extremity, sustained left ankle clonus, plantar responses were flexor  SENSORY: Decreased to light touch,  pinprick, and vibratory sensation in bilateral lower extremity   COORDINATION: Rapid alternating movements and fine finger movements are intact. There is no dysmetria on finger-to-nose and heel-knee-shin.    GAIT/STANCE: Deferred  DIAGNOSTIC DATA (LABS, IMAGING, TESTING) - I reviewed patient records, labs, notes, testing and imaging myself where available.   ASSESSMENT AND PLAN  VERNIS CABACUNGAN is a 41 y.o. female   Spastic paraplegia, T5 lipoma  Preauthorization of xeomin 600 units to relax bilateral ankle plantar flexion,   Marcial Pacas, M.D. Ph.D.  [facility] [address], [place], [place] [address] Ph: [phone] Fax: [phone]  CC: Referring Provider

## 2017-08-22 ENCOUNTER — Encounter: Payer: Self-pay | Admitting: Neurology

## 2017-08-22 ENCOUNTER — Ambulatory Visit (INDEPENDENT_AMBULATORY_CARE_PROVIDER_SITE_OTHER): Payer: Medicare Other | Admitting: Neurology

## 2017-08-22 VITALS — BP 102/62 | HR 62

## 2017-08-22 DIAGNOSIS — G8220 Paraplegia, unspecified: Secondary | ICD-10-CM

## 2017-08-22 DIAGNOSIS — G9520 Unspecified cord compression: Secondary | ICD-10-CM

## 2017-08-22 DIAGNOSIS — G839 Paralytic syndrome, unspecified: Secondary | ICD-10-CM

## 2017-08-22 DIAGNOSIS — G959 Disease of spinal cord, unspecified: Secondary | ICD-10-CM

## 2017-08-22 MED ORDER — INCOBOTULINUMTOXINA 100 UNITS IM SOLR
100.0000 [IU] | INTRAMUSCULAR | Status: AC
  Administered 2017-08-22: 100 [IU] via INTRAMUSCULAR

## 2017-08-22 NOTE — Progress Notes (Signed)
PATIENT: Debbie [name] DOB: 24-Oct-1976  Chief Complaint  Patient presents with  . Paraplegia    Xeomin 100 units x 6 vials - office supply     HISTORICAL  Debbie [name] is a 41 years old female, accompanied by her husband, seen in refer by Dr. Jannifer Franklin for evaluation of EMG guided botulism toxin injection for bilateral lower extremity spasticity, initial evaluation was on August 14 2017.  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  She complains of significant lower extremity,legs jerking at night time, despite polypharmacy treatment of Valium 5 mg as needed, gabapentin 300 mg 2 tablets 3 times a day, meloxicam, tizanidine.  Especially when she was fitted for ankle brace, she tends to hold bilateral foot and ankle plantarflexion, difficult to put her feet flat on the floor,  She was seen by rehabilitation physician Dr. Naaman Plummer in the past, initially receiving Botox injection, reviewed previous injection note in 2006, she reported good result was 400 units of Botox injection,  to alleviate her tendon contraction, she began to receive 5% phenol injection, to bilateral toe flexors, tibialis posterior, last injection was July 31 2016   She has missed her follow-up visit, now complains of worsening ankle plantarflexion, in her most recent lower extremity mold, she has apparent ankle plantarflexion, she to receive Botox  injection, then get new lower extremity mold to help her bearing weight,  Update August 22 2017: She returned with her husband for electrical stimulation guided xeomin injection for spastic bilateral lower extremity, to help bilateral ankle plantarflexion, for her to feet ankle brace better to go through physical therapy, we used xeomin 600 units daily.  REVIEW OF SYSTEMS: Full 14 system review of systems performed and notable only for as above  ALLERGIES: Allergies  Allergen Reactions  . Honey Bee Treatment [Bee Venom] Anaphylaxis and Itching  . Baclofen     Headaches     HOME MEDICATIONS: Current Outpatient Prescriptions  Medication Sig Dispense Refill  . atorvastatin (LIPITOR) 20 MG tablet Take 20 mg by mouth daily.    . chlorthalidone (HYGROTON) 25 MG tablet Take 25 mg by mouth daily.    . diazepam (VALIUM) 5 MG tablet Take 1 tablet (5 mg total) by mouth every 8 (eight) hours as needed (spasm). 90 tablet 3  . gabapentin (NEURONTIN) 300 MG capsule take 2 capsules by mouth three times a day 180 capsule 1  . lisinopril (PRINIVIL,ZESTRIL) 40 MG tablet Take 1 tablet (40 mg total) by mouth daily. 30 tablet 1  . meloxicam (MOBIC) 15 MG tablet Take 1 tablet (15 mg total) by mouth daily. 30 tablet 4  . metFORMIN (GLUCOPHAGE) 500 MG tablet Take 500 mg by mouth daily with breakfast.    . pantoprazole (PROTONIX) 40 MG tablet Take 1 tablet (40 mg total) by mouth daily. 30 tablet 5  . tiZANidine (ZANAFLEX) 4 MG tablet TAKE 1.5 TABLETS BY MOUTH 4 TIMES DAILY. 180 tablet 5   No  current facility-administered medications for this visit.     PAST MEDICAL HISTORY: Past Medical History:  Diagnosis Date  . Chronic myofascial pain   . Chronic pain   . Hypertension   . Mass    T5-T6 intradural mass  . Neurogenic bladder    and bowel  . Spastic paraplegia     PAST SURGICAL HISTORY: Past Surgical History:  Procedure Laterality Date  . CESAREAN SECTION    . FRACTURE SURGERY    .  LAMINECTOMY  11/11/2012   Procedure: THORACIC LAMINECTOMY FOR TUMOR;  Surgeon: Charlie Pitter, MD;  Location: [place] [facility];  Service: Neurosurgery;  Laterality: N/A;  thracic laminectomy for intradural and intramedullary neoplasm  . SPINE SURGERY      FAMILY HISTORY: Family History  Problem Relation Age of Onset  . Multiple sclerosis Father   . Diabetes Mother     SOCIAL HISTORY:  Social History   Social History  . Marital status: Legally Separated    Spouse name: N/A  . Number of children: 5  . Years of education: Some college   Occupational History  . Not on file.   Social History Main Topics  . Smoking status: Former Smoker    Packs/day: 1.50    Years: 10.00    Types: Cigarettes    Quit date: 11/07/2012  . Smokeless tobacco: Never Used  . Alcohol use No  . Drug use: No  . Sexual activity: Yes    Birth control/ protection: None   Other Topics Concern  . Not on file   Social History Narrative   Lives with husband and children   Caffeine use: soda daily        PHYSICAL EXAM   Vitals:   08/22/17 1459  BP: 102/62  Pulse: 62    Not recorded      There is no height or weight on file to calculate BMI.  PHYSICAL EXAMNIATION:  Gen: NAD, conversant, well nourised, obese, well groomed                     Cardiovascular: Regular rate rhythm, no peripheral edema, warm, nontender. Eyes: Conjunctivae clear without exudates or hemorrhage Neck: Supple, no carotid bruits. Pulmonary: Clear to auscultation bilaterally   NEUROLOGICAL EXAM:  MENTAL STATUS: Speech:    Speech is normal; fluent and spontaneous with normal comprehension.  Cognition:     Orientation to time, place and person     Normal recent and remote memory     Normal Attention span and concentration     Normal Language, naming, repeating,spontaneous speech     Fund of knowledge   CRANIAL NERVES: CN II: Visual fields are full to confrontation. Fundoscopic exam is normal with sharp discs and no  vascular changes. Pupils are round equal and briskly reactive to light. CN III, IV, VI: extraocular movement are normal. No ptosis. CN V: Facial sensation is intact to pinprick in all 3 divisions bilaterally. Corneal responses are intact.  CN VII: Face is symmetric with normal eye closure and smile. CN VIII: Hearing is normal to rubbing fingers CN IX, X: Palate elevates symmetrically. Phonation is normal. CN XI: Head turning and shoulder shrug are intact CN XII: Tongue is midline with normal movements and no atrophy.  MOTOR: There is no pronator drift of out-stretched arms. Muscle bulk and tone are normal at bilateral upper extremity, there is only mild bilateral hip flexion but no antigravity movement bilaterally, right lower extremity has 3/5 knee extension,  no significant ankle movement, She has bilateral ankle dorsiflexion, maximum ankle movement to the 90   REFLEXES: Reflexes are 2+ and symmetric at the biceps, triceps, hyperreflexia at bilateral lower extremity, sustained left ankle clonus, plantar responses were flexor  SENSORY: Decreased to light touch, pinprick, and vibratory sensation in bilateral lower extremity   COORDINATION: Rapid alternating movements and fine finger movements are intact. There is no dysmetria on finger-to-nose and heel-knee-shin.    GAIT/STANCE: Deferred  DIAGNOSTIC DATA (LABS, IMAGING, TESTING) - I reviewed patient records, labs, notes, testing and imaging myself where available.   ASSESSMENT AND PLAN  Debbie [name] is a 41 y.o. female   Spastic paraplegia, T5 lipoma  Preauthorization of xeomin 600 units to relax bilateral ankle plantar flexion,   we used 600 units of xeomin today  300 units to each lower extremity,     tibialis posterior 100 units  Gastrocnemius medialis 100 units  Soles 100 units  Flexor digitorum longus 100 units  Marcial Pacas, M.D. Ph.D.  [facility] [address] [place], [place]  [address] Ph: [phone] Fax: [phone]  CC: Referring Provider

## 2017-08-22 NOTE — Progress Notes (Signed)
**  Xeomin 100 units x 6 vials, NDC 6203-5597-41, Lot [id number], Exp 08/2019, office supply.//mck,rn**

## 2017-08-28 ENCOUNTER — Ambulatory Visit (HOSPITAL_COMMUNITY): Payer: Medicare Other | Attending: Neurology | Admitting: Physical Therapy

## 2017-10-04 DIAGNOSIS — J029 Acute pharyngitis, unspecified: Secondary | ICD-10-CM | POA: Diagnosis not present

## 2017-11-09 ENCOUNTER — Encounter (HOSPITAL_COMMUNITY): Payer: Self-pay

## 2017-11-09 ENCOUNTER — Ambulatory Visit (HOSPITAL_COMMUNITY): Payer: Medicare Other | Attending: Neurology

## 2017-11-09 DIAGNOSIS — R6889 Other general symptoms and signs: Secondary | ICD-10-CM | POA: Diagnosis not present

## 2017-11-09 DIAGNOSIS — M6281 Muscle weakness (generalized): Secondary | ICD-10-CM | POA: Diagnosis not present

## 2017-11-09 DIAGNOSIS — R2689 Other abnormalities of gait and mobility: Secondary | ICD-10-CM | POA: Diagnosis not present

## 2017-11-09 DIAGNOSIS — Z741 Need for assistance with personal care: Secondary | ICD-10-CM

## 2017-11-09 NOTE — Therapy (Addendum)
[place], [place], [address] Phone: [phone]   Fax:  [phone]  Physical Therapy Evaluation  Patient Details  Name: Debbie [name] MRN: [id number] Date of Birth: [birth year] Referring Provider: Marcial Pacas, MD   Encounter Date: 11/09/2017  PT End of Session - 11/09/17 1222    Visit Number  1    Number of Visits  9    Date for PT Re-Evaluation  12/10/17    Authorization Type  Medicare    Authorization Time Period  11/09/2017 - 12/04/2017    Authorization - Visit Number  1    Authorization - Number of Visits  10    PT Start Time  1030    PT Stop Time  1130    PT Time Calculation (min)  60 min    Equipment Utilized During Treatment  Other (comment) Motorized wheelchair; bilateral lower extremity braces; slide board    Activity Tolerance  Patient tolerated treatment well    Behavior During Therapy  [facility] for tasks assessed/performed       Past Medical History:  Diagnosis Date  . Chronic myofascial pain   . Chronic pain   . Hypertension   . Mass    T5-T6 intradural mass  . Neurogenic bladder    and bowel  . Spastic paraplegia     Past Surgical History:  Procedure Laterality Date  . CESAREAN SECTION    . FRACTURE SURGERY    . 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 SPINE SURGERY      There were no vitals filed for this visit.   Subjective Assessment - 11/09/17 1032    Subjective  "Patient is a pleasant 41 year-old female with a diagnosis of spastic paralysis. Upon arrival patient is in motorized wheelchair with joystick. Patient's mother is in attendance and she brought bilateral lower extremity braces. The patient reports that her left brace stays locked in extension in standing while the right brace can bend at  knee. Patient states that she has a manual WC at home, but that it does not fit when the lower extremity braces are on. Patient reports she had back surgery in December 2013. Recently patient has had lower extremity braces made and wants to be able to work on standing. Patient reports that she has good balance in sitting and reports that she is independent in transferring from [facility] to bed or mat and with all bed mobility. She reports she's had physical therapy in the past in which she was able to ambulate 47 steps. More recently she has stood in parallel bars with braces on when trying braces. The patient reports that she has been getting botox injections every 3 months and is planning to get another botox appointment soon. Patient reports that she can feel but can't move her left lower extremity and that she can move but can't feel her right lower extremity. Patient's goals include standing and being able to walk.     Patient is accompained by:  Family member    Pertinent History  Hypertension; mass T5-T6 intradural, neurogenic bowel and bladder changes; spastic paralysis; planned injections to decrease spasticity    Limitations  Standing;Walking    How long can you sit comfortably?  Unlimited    How long can you stand comfortably?  Requires 2 person max assist to stand    How long can you walk comfortably?  Patient ambulated 47 steps at previous physical therapy, but has not walked in over a year    Patient Stated Goals  To stand more easily and be able to take steps    Currently in Pain?  No/denies    Multiple Pain Sites  No         [facility] PT Assessment - 11/09/17 0001      Assessment   Medical Diagnosis  Spastic paralysis    Referring Provider  Marcial Pacas, MD    Onset Date/Surgical Date  11/09/17    Prior Therapy  Yes; was able to take 47 steps with different braces      Precautions   Required Braces or Orthoses  Other Brace/Splint Bilateral lower extremity braces the left locked; right bend       Sensation   Light Touch  Impaired by gross assessment    Additional Comments  Left lower extremity has sensation through dermatomes; right lower extremity is impaired with light touch      Tone   Assessment Location  Right Lower Extremity;Left Lower Extremity Clonus noted bilateral ankles      AROM   Right/Left Hip  Right;Left    Right/Left Knee  Right;Left    Right/Left Ankle  Right;Left      PROM   Overall PROM   Within functional limits for tasks performed Per this session ROM in bilateral hips and knees    Overall PROM Comments  Per this session limitation in bilateral dorsiflexion; rests in PF, other ankle motions WFL    Right/Left Ankle  Right;Left    Right Ankle Dorsiflexion  45    Left Ankle Dorsiflexion  50      Strength   Right/Left Hip  Right;Left    Right Hip Flexion  2+/5    Right Hip ABduction  2+/5    Left Hip Flexion  0/5    Left Hip ABduction  0/5    Right/Left Knee  Right;Left    Right Knee Extension  3-/5    Left Knee Extension  0/5    Right/Left Ankle  Right;Left    Right Ankle Dorsiflexion  -- Compensates for DF with ankle eversion    Left Ankle Dorsiflexion  0/5      Flexibility   Soft Tissue Assessment /Muscle Length  yes    Hamstrings  WFL      Bed Mobility   Sitting - Scoot to Edge of Bed  7: Independent      Transfers   Transfers  Lateral/Scoot Transfers    Five time sit to stand comments   3 sit to stands performed with max assist; use of parallel bars and lack of knee extension during session.    Lateral/Scoot Transfers  6: Modified independent (Device/Increase time) Patient performed lateral slide with transfer board independ      Balance   Balance Assessed  Yes      Static Sitting Balance   Static Sitting - Balance Support  No upper extremity supported;Feet supported    Static Sitting - Level of Assistance  7: Independent      Dynamic Sitting Balance   Dynamic Sitting - Balance Support  No upper extremity supported    Dynamic  Sitting - Level of Assistance  7: Independent    Dynamic Sitting balance - Comments  Patient performed leaning forward and touching floor with both upper extremities and  returning to       Static Standing Balance   Static Standing - Balance Support  Bilateral upper extremity supported    Static Standing - Level of Assistance  1: +2 Total assist    Static Standing - Comment/# of Minutes  3 second stands x 3      RLE Tone   Modified Ashworth Scale for Grading Hypertonia RLE  Considerable increase in muschle tone, passive movement difficult Hip and knee unremarkable; ankle resistance      LLE Tone   Modified Ashworth Scale for Grading Hypertonia LLE  Considerable increase in muschle tone, passive movement difficult Hip and knee unremarkable; ankle resistance             Objective measurements completed on examination: See above findings.              PT Education - 11/09/17 1640    Education provided  Yes    Education Details  Patient was educated on examination findings, and plan of care. Patient was educated about goals and discussed braces and wheelchair.     Person(s) Educated  Patient;Parent(s)    Methods  Explanation    Comprehension  Verbalized understanding       PT Short Term Goals - 11/09/17 1252      PT SHORT TERM GOAL #1   Title  Patient will perform sit to stand trasnfer with max assist of 2 people with knees in full extension.     Baseline  Patient stands with knees in 20 degrees of flexion    Time  4    Period  Weeks    Status  New    Target Date  12/07/17      PT SHORT TERM GOAL #2   Title  Patient will demonstrate an increase in right lower extremity strength of 1/2 MMT in all tested planes in order to improve functional independence with standing.     Baseline  Patient is at 2+ or 3- muscle strength in right lower extremity in tested planes    Time  4    Period  Weeks    Status  New    Target Date  12/07/17      PT SHORT TERM GOAL #3    Title  Patient will report understanding an report regular compliance with home exercise program in order to improve functional indpendence.     Baseline  Patient requires max assist of 2 in order to perform standing and has bilateral knee flexion in standing.     Time  4    Period  Weeks    Status  New    Target Date  12/07/17        PT Long Term Goals - 11/09/17 1257      PT LONG TERM GOAL #1   Title  Patient will demonstrate ability to perform sit to stand transfer with moderate assistance of 1 in order to improve functional independence.     Baseline  Patient requires 2 person max assist in order to stand.     Time  8    Period  Weeks    Status  New    Target Date  01/04/18      PT LONG TERM GOAL #2   Title  Patient will demonstrate ability to perform standing balance for 30 seconds with moderate assistance.     Baseline  Patient stands with max assist of 2 and parallel bars for <30 seconds.  Time  8    Period  Weeks    Status  New    Target Date  01/04/18      PT LONG TERM GOAL #3   Title  Patient will ambulate for 40 steps with moderate assistance to improve functional independence.     Baseline  0 steps    Time  8    Period  Weeks    Status  New    Target Date  01/04/18             Plan - 11/12/17 1644    Clinical Impression Statement  Patient is a pleasant 41 year-old female with a diagnosis of spastic paralysis. Upon arrival patient is in motorized wheelchair with joystick. Patient's mother is in attendance and she brought bilateral lower extremity braces. Upon examination patient demonstrated gross impairment in lower extremity light touch sensation particularly in the right lower extremity. Patient demonstrated bilateral lower extremity weakness particularly in the left lower extremity. In addition, patient demonstrated increased tone when bilateral ankles were tested with modified ashworth scale. In addition clonus was present bilaterally. Patient  demonstrated ability to perform WC to mat transfer with slide board. Patient maintained sitting balance statically and dynamically independently. With standing, patient was dependent with max assist of 2 and still demonstrated flexion in bilateral knees during standing. Patient was unable to take any steps this session. Patient did not report any change in symptoms while performing functional activities and BP was measured within normal range. Plan to continue with progression of functional activities such as standing and taking steps, as well as lower extremity strengthening, and balance.     History and Personal Factors relevant to plan of care:  Spastic paralysis; hypertension    Clinical Presentation due to:  complexity of medical history; time since last therapy    Clinical Decision Making  Moderate    Rehab Potential  Good    Clinical Impairments Affecting Rehab Potential  Positive factors: positive attitude; motivation; positive support system. Negative factors: chronicity, complexity of medical history    PT Frequency  1x / week    PT Duration  8 weeks    PT Treatment/Interventions  ADLs/Self Care Home Management;Gait training;Functional mobility training;Therapeutic activities;Therapeutic exercise;Balance training;Neuromuscular re-education;Patient/family education;Wheelchair mobility training;Manual techniques;Passive range of motion;Orthotic Fit/Training    PT Next Visit Plan  Complete FOTO; practice lower extremity strengthening with gravity minimized; practice sit to stands    Consulted and Agree with Plan of Care  Patient;Family member/caregiver    Family Member Consulted  Mother       Patient will benefit from skilled therapeutic intervention in order to improve the following deficits and impairments:  Abnormal gait, Impaired sensation, Decreased mobility, Increased muscle spasms, Impaired tone, Decreased activity tolerance, Decreased endurance, Decreased range of motion, Decreased  strength, Hypomobility, Decreased balance, Difficulty walking, Impaired flexibility, Obesity, Improper body mechanics  Visit Diagnosis: Dependent for standing  Dependent for rising to feet  Difficulty balancing when standing  Muscle weakness (generalized)  G-Codes - 12-Nov-2017 1633    Functional Assessment Tool Used (Outpatient Only)  Based on clinical judgement and assessment of standing ability and assistance required    Functional Limitation  Mobility: Walking and moving around    Mobility: Walking and Moving Around Current Status (P8099)  At least 60 percent but less than 80 percent impaired, limited or restricted    Mobility: Walking and Moving Around Goal Status (I3382)  At least 60 percent but less than 80 percent impaired, limited  or restricted     G8980 60-80%   Problem List Patient Active Problem List   Diagnosis Date Noted  . CAP (community acquired pneumonia) 11/26/2013  . Tendonitis of elbow, left 05/26/2013  . Neurogenic bladder 01/29/2013  . Neurogenic bowel 01/29/2013  . GERD (gastroesophageal reflux disease) 01/29/2013  . Myalgia and myositis, unspecified 12/24/2012  . Spastic paralysis ([place]) 12/24/2012  . Paraplegia ([place]) 11/18/2012  . 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 Lower extremity weakness 11/08/2012  . Hypokalemia 11/08/2012  . HTN (hypertension) 11/08/2012  . Myelopathy ([place]) 11/08/2012  . Paraparesis of both lower limbs ([place]) 11/08/2012   Clarene Critchley PT, DPT 5:18 PM, 11/09/17 [facility] [address] [place], [place], [address] Phone: [phone]   Fax:  [phone]  Name: Debbie [name] MRN: [id number] Date of Birth: 1976-04-28  PHYSICAL THERAPY DISCHARGE SUMMARY  Visits from Start of Care: 1  Current functional level related to goals / functional outcomes: Unknown; patient only attended evaluation.    Remaining deficits: Unknown; patient only attended evaluation.   Education / Equipment: Unable to provide as patient did not attend sessions after evaluation. Plan: Patient agrees to discharge.  Patient goals were not met. Patient is being discharged due to a change in medical status.  ?????     Clarene Critchley PT, DPT 2:04 PM, 11/30/17 [phone]

## 2017-11-13 ENCOUNTER — Telehealth (HOSPITAL_COMMUNITY): Payer: Self-pay | Admitting: Internal Medicine

## 2017-11-13 NOTE — Telephone Encounter (Signed)
11/13/17  pt just said she needed to cx this appt

## 2017-11-14 ENCOUNTER — Encounter (HOSPITAL_COMMUNITY): Payer: Medicare Other | Admitting: Physical Therapy

## 2017-11-20 ENCOUNTER — Telehealth (HOSPITAL_COMMUNITY): Payer: Self-pay

## 2017-11-20 NOTE — Telephone Encounter (Signed)
She is sick and can not come in today

## 2017-11-21 ENCOUNTER — Ambulatory Visit (HOSPITAL_COMMUNITY): Payer: Medicare Other

## 2017-11-21 ENCOUNTER — Telehealth: Payer: Self-pay | Admitting: Neurology

## 2017-11-21 NOTE — Telephone Encounter (Signed)
Pt r/s xeomin from 12/20 to 1/24. Pt thinks she has strep  FYI

## 2017-11-22 ENCOUNTER — Ambulatory Visit: Payer: Medicare Other | Admitting: Neurology

## 2017-11-22 DIAGNOSIS — G8222 Paraplegia, incomplete: Secondary | ICD-10-CM | POA: Diagnosis not present

## 2017-11-22 DIAGNOSIS — E119 Type 2 diabetes mellitus without complications: Secondary | ICD-10-CM | POA: Diagnosis not present

## 2017-11-22 DIAGNOSIS — I10 Essential (primary) hypertension: Secondary | ICD-10-CM | POA: Diagnosis not present

## 2017-11-22 DIAGNOSIS — K210 Gastro-esophageal reflux disease with esophagitis: Secondary | ICD-10-CM | POA: Diagnosis not present

## 2017-11-22 NOTE — Telephone Encounter (Signed)
Noted, thank you

## 2017-11-24 DIAGNOSIS — Z87891 Personal history of nicotine dependence: Secondary | ICD-10-CM | POA: Diagnosis not present

## 2017-11-24 DIAGNOSIS — Z8249 Family history of ischemic heart disease and other diseases of the circulatory system: Secondary | ICD-10-CM | POA: Diagnosis not present

## 2017-11-24 DIAGNOSIS — Z79899 Other long term (current) drug therapy: Secondary | ICD-10-CM | POA: Diagnosis not present

## 2017-11-24 DIAGNOSIS — Z820 Family history of epilepsy and other diseases of the nervous system: Secondary | ICD-10-CM | POA: Diagnosis not present

## 2017-11-24 DIAGNOSIS — G8222 Paraplegia, incomplete: Secondary | ICD-10-CM | POA: Diagnosis not present

## 2017-11-24 DIAGNOSIS — R109 Unspecified abdominal pain: Secondary | ICD-10-CM | POA: Diagnosis not present

## 2017-11-24 DIAGNOSIS — D121 Benign neoplasm of appendix: Secondary | ICD-10-CM | POA: Diagnosis not present

## 2017-11-24 DIAGNOSIS — E876 Hypokalemia: Secondary | ICD-10-CM | POA: Diagnosis present

## 2017-11-24 DIAGNOSIS — Z833 Family history of diabetes mellitus: Secondary | ICD-10-CM | POA: Diagnosis not present

## 2017-11-24 DIAGNOSIS — F418 Other specified anxiety disorders: Secondary | ICD-10-CM | POA: Diagnosis present

## 2017-11-24 DIAGNOSIS — T8189XS Other complications of procedures, not elsewhere classified, sequela: Secondary | ICD-10-CM | POA: Diagnosis not present

## 2017-11-24 DIAGNOSIS — K3589 Other acute appendicitis without perforation or gangrene: Secondary | ICD-10-CM | POA: Diagnosis not present

## 2017-11-24 DIAGNOSIS — G8220 Paraplegia, unspecified: Secondary | ICD-10-CM | POA: Diagnosis not present

## 2017-11-24 DIAGNOSIS — A419 Sepsis, unspecified organism: Secondary | ICD-10-CM | POA: Diagnosis not present

## 2017-11-24 DIAGNOSIS — E119 Type 2 diabetes mellitus without complications: Secondary | ICD-10-CM | POA: Diagnosis present

## 2017-11-24 DIAGNOSIS — I10 Essential (primary) hypertension: Secondary | ICD-10-CM | POA: Diagnosis present

## 2017-11-24 DIAGNOSIS — A4189 Other specified sepsis: Secondary | ICD-10-CM | POA: Diagnosis not present

## 2017-11-24 DIAGNOSIS — R1110 Vomiting, unspecified: Secondary | ICD-10-CM | POA: Diagnosis not present

## 2017-11-24 DIAGNOSIS — Z7984 Long term (current) use of oral hypoglycemic drugs: Secondary | ICD-10-CM | POA: Diagnosis not present

## 2017-11-24 DIAGNOSIS — R000 Tachycardia, unspecified: Secondary | ICD-10-CM | POA: Diagnosis not present

## 2017-11-24 DIAGNOSIS — G8929 Other chronic pain: Secondary | ICD-10-CM | POA: Diagnosis present

## 2017-11-24 DIAGNOSIS — K3580 Unspecified acute appendicitis: Secondary | ICD-10-CM | POA: Diagnosis not present

## 2017-11-28 ENCOUNTER — Telehealth (HOSPITAL_COMMUNITY): Payer: Self-pay | Admitting: Internal Medicine

## 2017-11-28 NOTE — Telephone Encounter (Signed)
11/28/17  had her appendix removed over the weekend and needs to stop therapy for at least 4-6 weeks for recovery

## 2017-11-29 ENCOUNTER — Ambulatory Visit (HOSPITAL_COMMUNITY): Payer: Medicare Other

## 2017-11-30 ENCOUNTER — Encounter (HOSPITAL_COMMUNITY): Payer: Self-pay | Admitting: Physical Therapy

## 2017-12-04 HISTORY — PX: APPENDECTOMY: SHX54

## 2017-12-06 ENCOUNTER — Encounter (HOSPITAL_COMMUNITY): Payer: Medicare Other

## 2017-12-10 ENCOUNTER — Telehealth (HOSPITAL_COMMUNITY): Payer: Self-pay | Admitting: Internal Medicine

## 2017-12-10 NOTE — Telephone Encounter (Signed)
12/10/17  I called patient to see if she was ready to schedule more appts and she said the dr re: her appendix told her she couldn't lift anything over 10 lbs. For next 6-8 weeks.  I told her that we would most likely discharge her and if she needed to come back would have to be re-evaluated with a new order.

## 2017-12-13 ENCOUNTER — Encounter (HOSPITAL_COMMUNITY): Payer: Medicare Other

## 2017-12-20 ENCOUNTER — Encounter (HOSPITAL_COMMUNITY): Payer: Medicare Other

## 2017-12-21 DIAGNOSIS — Z87891 Personal history of nicotine dependence: Secondary | ICD-10-CM | POA: Diagnosis not present

## 2017-12-21 DIAGNOSIS — R112 Nausea with vomiting, unspecified: Secondary | ICD-10-CM | POA: Diagnosis not present

## 2017-12-21 DIAGNOSIS — Z7984 Long term (current) use of oral hypoglycemic drugs: Secondary | ICD-10-CM | POA: Diagnosis not present

## 2017-12-21 DIAGNOSIS — E119 Type 2 diabetes mellitus without complications: Secondary | ICD-10-CM | POA: Diagnosis not present

## 2017-12-21 DIAGNOSIS — G8220 Paraplegia, unspecified: Secondary | ICD-10-CM | POA: Diagnosis not present

## 2017-12-21 DIAGNOSIS — Z79899 Other long term (current) drug therapy: Secondary | ICD-10-CM | POA: Diagnosis not present

## 2017-12-21 DIAGNOSIS — E785 Hyperlipidemia, unspecified: Secondary | ICD-10-CM | POA: Diagnosis not present

## 2017-12-21 DIAGNOSIS — R1110 Vomiting, unspecified: Secondary | ICD-10-CM | POA: Diagnosis not present

## 2017-12-21 DIAGNOSIS — R109 Unspecified abdominal pain: Secondary | ICD-10-CM | POA: Diagnosis not present

## 2017-12-21 DIAGNOSIS — I10 Essential (primary) hypertension: Secondary | ICD-10-CM | POA: Diagnosis not present

## 2017-12-25 DIAGNOSIS — R100 Acute abdomen: Secondary | ICD-10-CM | POA: Diagnosis not present

## 2017-12-25 NOTE — Telephone Encounter (Signed)
Pt c/a xeomin appt for 1/24. She had appendectomy and is still having abdominal problems. She will call back to r/s

## 2017-12-26 NOTE — Telephone Encounter (Signed)
Noted, thank you

## 2017-12-27 ENCOUNTER — Encounter (HOSPITAL_COMMUNITY): Payer: Medicare Other

## 2017-12-27 ENCOUNTER — Ambulatory Visit: Payer: Medicare Other | Admitting: Neurology

## 2017-12-28 DIAGNOSIS — R109 Unspecified abdominal pain: Secondary | ICD-10-CM | POA: Diagnosis not present

## 2017-12-28 DIAGNOSIS — R932 Abnormal findings on diagnostic imaging of liver and biliary tract: Secondary | ICD-10-CM | POA: Diagnosis not present

## 2017-12-28 DIAGNOSIS — R933 Abnormal findings on diagnostic imaging of other parts of digestive tract: Secondary | ICD-10-CM | POA: Diagnosis not present

## 2017-12-28 DIAGNOSIS — K760 Fatty (change of) liver, not elsewhere classified: Secondary | ICD-10-CM | POA: Diagnosis not present

## 2018-01-03 ENCOUNTER — Encounter (HOSPITAL_COMMUNITY): Payer: Medicare Other

## 2018-01-25 ENCOUNTER — Ambulatory Visit: Payer: Medicare Other | Admitting: Neurology

## 2018-01-28 ENCOUNTER — Ambulatory Visit (INDEPENDENT_AMBULATORY_CARE_PROVIDER_SITE_OTHER): Payer: Medicare Other | Admitting: Internal Medicine

## 2018-01-28 ENCOUNTER — Encounter (INDEPENDENT_AMBULATORY_CARE_PROVIDER_SITE_OTHER): Payer: Self-pay | Admitting: *Deleted

## 2018-01-28 ENCOUNTER — Encounter (INDEPENDENT_AMBULATORY_CARE_PROVIDER_SITE_OTHER): Payer: Self-pay | Admitting: Internal Medicine

## 2018-01-28 DIAGNOSIS — R1031 Right lower quadrant pain: Secondary | ICD-10-CM | POA: Diagnosis not present

## 2018-01-28 LAB — CBC WITH DIFFERENTIAL/PLATELET
Basophils Absolute: 54 cells/uL (ref 0–200)
Basophils Relative: 0.5 %
EOS PCT: 1.9 %
Eosinophils Absolute: 203 cells/uL (ref 15–500)
HEMATOCRIT: 35.9 % (ref 35.0–45.0)
HEMOGLOBIN: 11.7 g/dL (ref 11.7–15.5)
LYMPHS ABS: 2386 {cells}/uL (ref 850–3900)
MCH: 26.2 pg — ABNORMAL LOW (ref 27.0–33.0)
MCHC: 32.6 g/dL (ref 32.0–36.0)
MCV: 80.5 fL (ref 80.0–100.0)
MPV: 9.1 fL (ref 7.5–12.5)
Monocytes Relative: 7.1 %
NEUTROS ABS: 7297 {cells}/uL (ref 1500–7800)
NEUTROS PCT: 68.2 %
Platelets: 342 10*3/uL (ref 140–400)
RBC: 4.46 10*6/uL (ref 3.80–5.10)
RDW: 14.6 % (ref 11.0–15.0)
Total Lymphocyte: 22.3 %
WBC: 10.7 10*3/uL (ref 3.8–10.8)
WBCMIX: 760 {cells}/uL (ref 200–950)

## 2018-01-28 LAB — COMPREHENSIVE METABOLIC PANEL
AG RATIO: 1.6 (calc) (ref 1.0–2.5)
ALBUMIN MSPROF: 4.5 g/dL (ref 3.6–5.1)
ALKALINE PHOSPHATASE (APISO): 101 U/L (ref 33–115)
ALT: 33 U/L — ABNORMAL HIGH (ref 6–29)
AST: 15 U/L (ref 10–30)
BILIRUBIN TOTAL: 0.4 mg/dL (ref 0.2–1.2)
BUN: 10 mg/dL (ref 7–25)
CALCIUM: 9.8 mg/dL (ref 8.6–10.2)
CHLORIDE: 100 mmol/L (ref 98–110)
CO2: 29 mmol/L (ref 20–32)
Creat: 0.74 mg/dL (ref 0.50–1.10)
GLOBULIN: 2.9 g/dL (ref 1.9–3.7)
Glucose, Bld: 113 mg/dL — ABNORMAL HIGH (ref 65–99)
POTASSIUM: 3.6 mmol/L (ref 3.5–5.3)
SODIUM: 140 mmol/L (ref 135–146)
TOTAL PROTEIN: 7.4 g/dL (ref 6.1–8.1)

## 2018-01-28 NOTE — Progress Notes (Signed)
Subjective:    Patient ID: Debbie [name], female    DOB: January 06, 1976, 42 y.o.   MRN: [id number]  HPI Referred by Dr. Sherrie Sport for She underwent an appendectomy around Christmas. Since then she has rt lower quadrant pain. She also has had nausea. She has had had pain for about 1 1/2 months. She describes the pain as comes and goes. She rates the pain at a 4 now. She does occasionally have nausea.  She says sometimes she cannot eat a full meal.  She cannot tell me if she has lost any weight.  She is wheelchair bound x 5 yrs.  She has paralysis from the waist down. Underwent spinal surgery for a tumor and was paralyzed.  She says she barely eat. She says nothing taste right.  She has acid reflux if she doesn't take her Protonix.  She denies any fever.  She has a BM about once a week. She does not take medication for her constipation.    12/28/2017 US abdomen: severe abdominal pain: Diffuse increased echogenicity of the liver usually seen with hepatic steatosis. Poorly defined hypercholic appearance of the pancrease.     Review of Systems Past Medical History:  Diagnosis Date  . Chronic myofascial pain   . Chronic pain   . Hypertension   . Mass    T5-T6 intradural mass  . Neurogenic bladder    and bowel  . Spastic paraplegia     Past Surgical History:  Procedure Laterality Date  . CESAREAN SECTION    . FRACTURE SURGERY    . 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 SPINE SURGERY      Allergies  Allergen Reactions  . Honey Bee Treatment [Bee Venom] Anaphylaxis and Itching  . Baclofen     Headaches     Current Outpatient Medications on File Prior to Visit  Medication Sig Dispense Refill  . atorvastatin (LIPITOR) 20 MG tablet Take 20 mg by mouth daily.    . diazepam (VALIUM) 5 MG tablet  Take 1 tablet (5 mg total) by mouth every 8 (eight) hours as needed (spasm). 90 tablet 3  . gabapentin (NEURONTIN) 300 MG capsule take 2 capsules by mouth three times a day 180 capsule 1  . lisinopril (PRINIVIL,ZESTRIL) 40 MG tablet Take 1 tablet (40 mg total) by mouth daily. 30 tablet 1  . meloxicam (MOBIC) 15 MG tablet Take 1 tablet (15 mg total) by mouth daily. 30 tablet 4  . metFORMIN (GLUCOPHAGE) 500 MG tablet Take 500 mg by mouth daily with breakfast.    . pantoprazole (PROTONIX) 40 MG tablet Take 1 tablet (40 mg total) by mouth daily. 30 tablet 5  . tiZANidine (ZANAFLEX) 4 MG tablet TAKE 1.5 TABLETS BY MOUTH 4 TIMES DAILY. 180 tablet 5  . chlorthalidone (HYGROTON) 25 MG tablet Take 25 mg by mouth daily.     Current Facility-Administered Medications on File Prior to Visit  Medication Dose Route Frequency Provider Last Rate Last Dose  . incobotulinumtoxinA (XEOMIN) 100 units injection 100 Units  100 Units Intramuscular Q90 days Marcial Pacas, MD   100 Units at 08/22/17 1550        Objective:   Physical Exam Blood pressure (!) 142/90, pulse 68, temperature 98.2 F (36.8 C), height 5' 8.5" (1.74 m), weight 258 lb (117 kg). (weight  was stated).   Patient examined from wheel chair.   Alert and oriented. Skin warm and dry. Oral mucosa is moist.   . Sclera anicteric, conjunctivae is pink. Thyroid not enlarged. No cervical lymphadenopathy. Lungs clear. Heart regular rate and rhythm.  Abdomen is soft. Bowel sounds are positive. No hepatomegaly. No abdominal masses felt. No tenderness.  No edema to lower extremities.         Assessment & Plan:  RLQ pain. Am going to get a CT scan to see if anything is going on.  Will also get a CBC and CMET.

## 2018-01-28 NOTE — Patient Instructions (Signed)
Labs today and CT scan.

## 2018-02-08 ENCOUNTER — Encounter (HOSPITAL_COMMUNITY): Payer: Self-pay

## 2018-02-08 ENCOUNTER — Ambulatory Visit (HOSPITAL_COMMUNITY)
Admission: RE | Admit: 2018-02-08 | Discharge: 2018-02-08 | Disposition: A | Discharge status: Home / Self Care | Payer: Medicare Other | Source: Ambulatory Visit | Attending: Internal Medicine | Admitting: Internal Medicine

## 2018-02-08 DIAGNOSIS — M87852 Other osteonecrosis, left femur: Secondary | ICD-10-CM | POA: Diagnosis not present

## 2018-02-08 DIAGNOSIS — I700 Atherosclerosis of aorta: Secondary | ICD-10-CM | POA: Insufficient documentation

## 2018-02-08 DIAGNOSIS — R1031 Right lower quadrant pain: Secondary | ICD-10-CM | POA: Diagnosis not present

## 2018-02-08 DIAGNOSIS — M87851 Other osteonecrosis, right femur: Secondary | ICD-10-CM | POA: Insufficient documentation

## 2018-02-08 MED ORDER — IOPAMIDOL (ISOVUE-300) INJECTION 61%
100.0000 mL | Freq: Once | INTRAVENOUS | Status: AC | PRN
  Administered 2018-02-08: 100 mL via INTRAVENOUS

## 2018-02-11 ENCOUNTER — Telehealth (INDEPENDENT_AMBULATORY_CARE_PROVIDER_SITE_OTHER): Payer: Self-pay | Admitting: Internal Medicine

## 2018-02-11 NOTE — Telephone Encounter (Signed)
noted 

## 2018-02-11 NOTE — Telephone Encounter (Signed)
Patient called requesting results phone# [phone]

## 2018-04-15 DIAGNOSIS — Z0000 Encounter for general adult medical examination without abnormal findings: Secondary | ICD-10-CM | POA: Diagnosis not present

## 2018-04-15 DIAGNOSIS — K219 Gastro-esophageal reflux disease without esophagitis: Secondary | ICD-10-CM | POA: Diagnosis not present

## 2018-04-15 DIAGNOSIS — I10 Essential (primary) hypertension: Secondary | ICD-10-CM | POA: Diagnosis not present

## 2018-04-15 DIAGNOSIS — Z1389 Encounter for screening for other disorder: Secondary | ICD-10-CM | POA: Diagnosis not present

## 2018-04-15 DIAGNOSIS — E7849 Other hyperlipidemia: Secondary | ICD-10-CM | POA: Diagnosis not present

## 2018-04-15 DIAGNOSIS — E119 Type 2 diabetes mellitus without complications: Secondary | ICD-10-CM | POA: Diagnosis not present

## 2018-05-02 DIAGNOSIS — E7849 Other hyperlipidemia: Secondary | ICD-10-CM | POA: Diagnosis not present

## 2018-05-02 DIAGNOSIS — I10 Essential (primary) hypertension: Secondary | ICD-10-CM | POA: Diagnosis not present

## 2018-05-02 DIAGNOSIS — K219 Gastro-esophageal reflux disease without esophagitis: Secondary | ICD-10-CM | POA: Diagnosis not present

## 2018-05-02 DIAGNOSIS — E119 Type 2 diabetes mellitus without complications: Secondary | ICD-10-CM | POA: Diagnosis not present

## 2018-07-16 DIAGNOSIS — Z1389 Encounter for screening for other disorder: Secondary | ICD-10-CM | POA: Diagnosis not present

## 2018-07-16 DIAGNOSIS — I10 Essential (primary) hypertension: Secondary | ICD-10-CM | POA: Diagnosis not present

## 2018-07-16 DIAGNOSIS — Z0000 Encounter for general adult medical examination without abnormal findings: Secondary | ICD-10-CM | POA: Diagnosis not present

## 2018-07-16 DIAGNOSIS — K219 Gastro-esophageal reflux disease without esophagitis: Secondary | ICD-10-CM | POA: Diagnosis not present

## 2018-07-16 DIAGNOSIS — E7849 Other hyperlipidemia: Secondary | ICD-10-CM | POA: Diagnosis not present

## 2018-07-16 DIAGNOSIS — E119 Type 2 diabetes mellitus without complications: Secondary | ICD-10-CM | POA: Diagnosis not present

## 2018-07-17 DIAGNOSIS — K219 Gastro-esophageal reflux disease without esophagitis: Secondary | ICD-10-CM | POA: Diagnosis not present

## 2018-07-17 DIAGNOSIS — I10 Essential (primary) hypertension: Secondary | ICD-10-CM | POA: Diagnosis not present

## 2018-07-17 DIAGNOSIS — E7849 Other hyperlipidemia: Secondary | ICD-10-CM | POA: Diagnosis not present

## 2018-07-17 DIAGNOSIS — E119 Type 2 diabetes mellitus without complications: Secondary | ICD-10-CM | POA: Diagnosis not present

## 2018-08-01 ENCOUNTER — Ambulatory Visit: Payer: Medicare Other | Admitting: Neurology

## 2018-08-01 ENCOUNTER — Telehealth: Payer: Self-pay | Admitting: Neurology

## 2018-08-01 ENCOUNTER — Encounter: Payer: Self-pay | Admitting: Neurology

## 2018-08-01 ENCOUNTER — Encounter

## 2018-08-01 NOTE — Telephone Encounter (Signed)
This patient did not show for a revisit appointment today. 

## 2018-11-12 DIAGNOSIS — E7849 Other hyperlipidemia: Secondary | ICD-10-CM | POA: Diagnosis not present

## 2018-11-12 DIAGNOSIS — E119 Type 2 diabetes mellitus without complications: Secondary | ICD-10-CM | POA: Diagnosis not present

## 2018-11-12 DIAGNOSIS — N3091 Cystitis, unspecified with hematuria: Secondary | ICD-10-CM | POA: Diagnosis not present

## 2018-11-12 DIAGNOSIS — K219 Gastro-esophageal reflux disease without esophagitis: Secondary | ICD-10-CM | POA: Diagnosis not present

## 2018-11-12 DIAGNOSIS — I10 Essential (primary) hypertension: Secondary | ICD-10-CM | POA: Diagnosis not present

## 2019-02-06 DIAGNOSIS — I10 Essential (primary) hypertension: Secondary | ICD-10-CM | POA: Diagnosis not present

## 2019-02-06 DIAGNOSIS — E119 Type 2 diabetes mellitus without complications: Secondary | ICD-10-CM | POA: Diagnosis not present

## 2019-02-06 DIAGNOSIS — K219 Gastro-esophageal reflux disease without esophagitis: Secondary | ICD-10-CM | POA: Diagnosis not present

## 2019-02-06 DIAGNOSIS — E7849 Other hyperlipidemia: Secondary | ICD-10-CM | POA: Diagnosis not present

## 2019-06-02 DIAGNOSIS — K219 Gastro-esophageal reflux disease without esophagitis: Secondary | ICD-10-CM | POA: Diagnosis not present

## 2019-06-02 DIAGNOSIS — B379 Candidiasis, unspecified: Secondary | ICD-10-CM | POA: Diagnosis not present

## 2019-06-02 DIAGNOSIS — Z1389 Encounter for screening for other disorder: Secondary | ICD-10-CM | POA: Diagnosis not present

## 2019-06-02 DIAGNOSIS — E119 Type 2 diabetes mellitus without complications: Secondary | ICD-10-CM | POA: Diagnosis not present

## 2019-06-02 DIAGNOSIS — Z0000 Encounter for general adult medical examination without abnormal findings: Secondary | ICD-10-CM | POA: Diagnosis not present

## 2019-06-02 DIAGNOSIS — I10 Essential (primary) hypertension: Secondary | ICD-10-CM | POA: Diagnosis not present

## 2019-06-02 DIAGNOSIS — E7849 Other hyperlipidemia: Secondary | ICD-10-CM | POA: Diagnosis not present

## 2019-06-02 DIAGNOSIS — L309 Dermatitis, unspecified: Secondary | ICD-10-CM | POA: Diagnosis not present

## 2019-07-08 ENCOUNTER — Emergency Department (HOSPITAL_COMMUNITY)
Admission: EM | Admit: 2019-07-08 | Discharge: 2019-07-09 | Disposition: A | Discharge status: Home / Self Care | Payer: Medicare Other | Attending: Emergency Medicine | Admitting: Emergency Medicine

## 2019-07-08 ENCOUNTER — Encounter (HOSPITAL_COMMUNITY): Payer: Self-pay | Admitting: Emergency Medicine

## 2019-07-08 DIAGNOSIS — M62831 Muscle spasm of calf: Secondary | ICD-10-CM | POA: Diagnosis not present

## 2019-07-08 DIAGNOSIS — Z87891 Personal history of nicotine dependence: Secondary | ICD-10-CM | POA: Insufficient documentation

## 2019-07-08 DIAGNOSIS — M62838 Other muscle spasm: Secondary | ICD-10-CM | POA: Diagnosis not present

## 2019-07-08 DIAGNOSIS — Z79899 Other long term (current) drug therapy: Secondary | ICD-10-CM | POA: Insufficient documentation

## 2019-07-08 DIAGNOSIS — Z7984 Long term (current) use of oral hypoglycemic drugs: Secondary | ICD-10-CM | POA: Diagnosis not present

## 2019-07-08 DIAGNOSIS — E876 Hypokalemia: Secondary | ICD-10-CM | POA: Insufficient documentation

## 2019-07-08 DIAGNOSIS — I10 Essential (primary) hypertension: Secondary | ICD-10-CM | POA: Insufficient documentation

## 2019-07-08 LAB — BASIC METABOLIC PANEL
Anion gap: 11 (ref 5–15)
BUN: 8 mg/dL (ref 6–20)
CO2: 24 mmol/L (ref 22–32)
Calcium: 9.2 mg/dL (ref 8.9–10.3)
Chloride: 103 mmol/L (ref 98–111)
Creatinine, Ser: 0.67 mg/dL (ref 0.44–1.00)
GFR calc Af Amer: >60 mL/min (ref >60)
GFR calc non Af Amer: >60 mL/min (ref >60)
Glucose, Bld: 136 mg/dL — ABNORMAL HIGH (ref 70–99)
Potassium: 3.4 mmol/L — ABNORMAL LOW (ref 3.5–5.1)
Sodium: 138 mmol/L (ref 135–145)

## 2019-07-08 LAB — CBC
HCT: 42.3 % (ref 36.0–46.0)
Hemoglobin: 13.2 g/dL (ref 12.0–15.0)
MCH: 29.1 pg (ref 26.0–34.0)
MCHC: 31.2 g/dL (ref 30.0–36.0)
MCV: 93.2 fL (ref 80.0–100.0)
Platelets: 378 10*3/uL (ref 150–400)
RBC: 4.54 MIL/uL (ref 3.87–5.11)
RDW: 14.6 % (ref 11.5–15.5)
WBC: 11.6 10*3/uL — ABNORMAL HIGH (ref 4.0–10.5)
nRBC: 0 % (ref 0.0–0.2)

## 2019-07-08 NOTE — ED Triage Notes (Signed)
Pt states 6 years ago she became paralyzed from the waist down. Two weeks ago pt began to have a constant twitching and spasms on both of her legs.

## 2019-07-09 DIAGNOSIS — M7918 Myalgia, other site: Secondary | ICD-10-CM | POA: Diagnosis not present

## 2019-07-09 MED ORDER — GABAPENTIN 300 MG PO CAPS: 900.0000 mg | ORAL_CAPSULE | Freq: Three times a day (TID) | ORAL | 0 refills | Status: DC

## 2019-07-09 MED ORDER — CYCLOBENZAPRINE HCL 5 MG PO TABS: 5.0000 mg | ORAL_TABLET | Freq: Two times a day (BID) | ORAL | 0 refills | Status: DC | PRN

## 2019-07-09 MED ORDER — POTASSIUM CHLORIDE CRYS ER 20 MEQ PO TBCR
40.0000 meq | EXTENDED_RELEASE_TABLET | Freq: Once | ORAL | Status: AC
  Administered 2019-07-09: 40 meq via ORAL
  Filled 2019-07-09: qty 2

## 2019-07-09 MED ORDER — CYCLOBENZAPRINE HCL 10 MG PO TABS
5.0000 mg | ORAL_TABLET | Freq: Once | ORAL | Status: AC
  Administered 2019-07-09: 5 mg via ORAL
  Filled 2019-07-09: qty 1

## 2019-07-09 NOTE — ED Provider Notes (Signed)
[facility] EMERGENCY DEPARTMENT Provider Note   CSN: [id number] Arrival date & time: 07/08/19  1811    History   Chief Complaint Chief Complaint  Patient presents with  . Spasms    HPI Debbie [name] is a 43 y.o. female.     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  Patient reports bilateral leg spasms for the last 2 weeks.  She states at baseline she has spasm which is usually controlled by gabapentin and tizanidine.  However, she has had noted increasing spasming.  She denies any new back pain.  She reports that she urinates on a bedside commode and she has not noted any changes in those habits.  She also reports "it feels like my muscles are being twisted' mostly in her left leg.  Past Medical History:  Diagnosis Date  . Chronic myofascial pain   . Chronic pain   . Hypertension   . Mass    T5-T6 intradural mass  . Neurogenic bladder    and bowel  . Spastic paraplegia     Patient Active Problem List   Diagnosis Date Noted  . CAP (community acquired pneumonia) 11/26/2013  . Tendonitis of elbow, left 05/26/2013  . Neurogenic bladder 01/29/2013  . Neurogenic bowel 01/29/2013  . GERD (gastroesophageal reflux disease) 01/29/2013  . Myalgia and myositis, unspecified 12/24/2012  . 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 Paraplegia ([place]) 11/18/2012  . 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 Lower extremity weakness 11/08/2012  . Hypokalemia 11/08/2012  . HTN (hypertension) 11/08/2012  . Myelopathy ([place]) 11/08/2012  . Paraparesis of both lower limbs ([place]) 11/08/2012    Past Surgical History:  Procedure Laterality Date  . CESAREAN SECTION    . FRACTURE SURGERY    . LAMINECTOMY  11/11/2012   Procedure: THORACIC LAMINECTOMY FOR TUMOR;  Surgeon: Charlie Pitter, MD;   Location: [facility];  Service: Neurosurgery;  Laterality: N/A;  thracic laminectomy for intradural and intramedullary neoplasm  . SPINE SURGERY       OB History    Gravida  3   Para  2   Term      Preterm  2   AB  1   Living        SAB      TAB      Ectopic  1   Multiple      Live Births               Home Medications    Prior to Admission medications   Medication Sig Start Date End Date Taking? Authorizing Provider  atorvastatin (LIPITOR) 20 MG tablet Take 20 mg by mouth daily.   Yes [provider]  chlorthalidone (HYGROTON) 25 MG tablet Take 25 mg by mouth daily.   Yes [provider]  diazepam (VALIUM) 5 MG tablet Take 1 tablet (5 mg total) by mouth every 8 (eight) hours as needed (spasm). 11/01/16  Yes Meredith Staggers, MD  gabapentin (NEURONTIN) 300 MG capsule take 2 capsules by mouth three times a day Patient taking differently: Take 600 mg by mouth 3 (three) times daily.  11/30/16  Yes Meredith Staggers, MD  lisinopril (PRINIVIL,ZESTRIL) 40 MG tablet Take 1 tablet (40 mg total) by mouth daily. 12/06/12  Yes Angiulli, Lavon Paganini, PA-C  meloxicam (MOBIC) 15 MG  tablet Take 1 tablet (15 mg total) by mouth daily. Patient taking differently: Take 15 mg by mouth daily as needed for pain.  02/23/16  Yes Meredith Staggers, MD  metFORMIN (GLUCOPHAGE) 500 MG tablet Take 500 mg by mouth daily as needed (sugar levels).    Yes [provider]  pantoprazole (PROTONIX) 40 MG tablet Take 1 tablet (40 mg total) by mouth daily. 09/19/13  Yes Meredith Staggers, MD  tiZANidine (ZANAFLEX) 4 MG tablet TAKE 1.5 TABLETS BY MOUTH 4 TIMES DAILY. Patient taking differently: Take 6 mg by mouth 4 (four) times daily.  09/29/16  Yes Meredith Staggers, MD  cyclobenzaprine (FLEXERIL) 5 MG tablet Take 1 tablet (5 mg total) by mouth 2 (two) times daily as needed for muscle spasms. 07/09/19   , Barbette Hair, MD  gabapentin (NEURONTIN) 300 MG capsule Take 3 capsules  (900 mg total) by mouth 3 (three) times daily. 07/09/19   , Barbette Hair, MD    Family History Family History  Problem Relation Age of Onset  . Multiple sclerosis Father   . Diabetes Mother     Social History Social History   Tobacco Use  . Smoking status: Former Smoker    Packs/day: 1.50    Years: 10.00    Pack years: 15.00    Types: Cigarettes    Quit date: 11/07/2012    Years since quitting: 6.6  . Smokeless tobacco: Never Used  . Tobacco comment: quit 5 yrs ago  Substance Use Topics  . Alcohol use: No  . Drug use: No     Allergies   Honey bee treatment [bee venom] and Baclofen   Review of Systems Review of Systems  Constitutional: Negative for fever.  Respiratory: Negative for shortness of breath.   Cardiovascular: Negative for chest pain.  Gastrointestinal: Negative for abdominal pain, nausea and vomiting.  Musculoskeletal: Positive for myalgias. Negative for back pain.       Leg spasms  All other systems reviewed and are negative.    Physical Exam Updated Vital Signs BP (!) 193/115 (BP Location: Right Arm)   Pulse 99   Temp 98.5 F (36.9 C) (Oral)   Resp 18   SpO2 100%   Physical Exam Vitals signs and nursing note reviewed.  Constitutional:      Appearance: She is well-developed.  HENT:     Head: Normocephalic and atraumatic.  Eyes:     Pupils: Pupils are equal, round, and reactive to light.  Cardiovascular:     Rate and Rhythm: Normal rate and regular rhythm.  Pulmonary:     Effort: Pulmonary effort is normal. No respiratory distress.  Abdominal:     General: Bowel sounds are normal.     Palpations: Abdomen is soft.     Tenderness: There is no abdominal tenderness.  Musculoskeletal:        General: No swelling.     Right lower leg: No edema.     Left lower leg: No edema.  Skin:    General: Skin is warm and dry.  Neurological:     Mental Status: She is alert and oriented to person, place, and time.     Comments: Spasticity noted in  bilateral lower extremities, brisk patellar reflexes bilaterally, spontaneous movement noted mostly in the right lower extremity but not against gravity      ED Treatments / Results  Labs (all labs ordered are listed, but only abnormal results are displayed) Labs Reviewed  BASIC METABOLIC PANEL - Abnormal;  Notable for the following components:      Result Value   Potassium 3.4 (*)    Glucose, Bld 136 (*)    All other components within normal limits  CBC - Abnormal; Notable for the following components:   WBC 11.6 (*)    All other components within normal limits    EKG None  Radiology No results found.  Procedures Procedures (including critical care time)  Medications Ordered in ED Medications  potassium chloride SA (K-DUR) CR tablet 40 mEq (40 mEq Oral Given 07/09/19 0321)  cyclobenzaprine (FLEXERIL) tablet 5 mg (5 mg Oral Given 07/09/19 2395)     Initial Impression / Assessment and Plan / ED Course  I have reviewed the triage vital signs and the nursing notes.  Pertinent labs & imaging results that were available during my care of the patient were reviewed by me and considered in my medical decision making (see chart for details).        Patient presents with increasing spasms of bilateral lower extremities.  She has these at baseline but feels they have increased in her not getting better with her medications.  She is overall nontoxic-appearing.  Vital signs notable for blood pressure of 193/115.  She reports pain and has a history of hypertension.  Her neuro exam appears to be at her baseline.  She is not having any back pain or changes in her urinary habits suggestive of recurrent compression.  Lab work obtained.  She has mild hypokalemia with potassium of 3.4.  This was replaced.  However, no significant metabolic derangements noted.  Feel that her symptoms are likely related to her spasticity.  I see nothing on exam that suggest recurrence of her compression.   Recommending increasing Neurontin to 900 mg 3 times daily.  She was given Flexeril for muscle pain.  Recommend follow-up with her primary physician and neurology.  She needs recheck of her blood pressure.  After history, exam, and medical workup I feel the patient has been appropriately medically screened and is safe for discharge home. Pertinent diagnoses were discussed with the patient. Patient was given return precautions.  Final Clinical Impressions(s) / ED Diagnoses   Final diagnoses:  Muscle spasm of both lower legs  Hypokalemia  Essential hypertension    ED Discharge Orders         Ordered    cyclobenzaprine (FLEXERIL) 5 MG tablet  2 times daily PRN     07/09/19 0355    gabapentin (NEURONTIN) 300 MG capsule  3 times daily     07/09/19 0355           , Barbette Hair, MD 07/09/19 [phone]

## 2019-07-09 NOTE — Discharge Instructions (Addendum)
Your seen today for leg cramps and spasm.  Increase gabapentin to 900 mg 3 times daily to see if this helps.  You may also take Flexeril as needed.  Follow-up closely with your primary doctor and/or Dr. Trenton Gammon if you have concerns for worsening symptoms.  If you develop back pain or changes in your urinary habits, you need to be reevaluated immediately.  Increase your potassium intake and food.  Take blood pressure medication as prescribed.  Follow-up closely with your primary physician for recheck of blood pressure.

## 2019-07-09 NOTE — ED Notes (Signed)
ED Provider at bedside. 

## 2019-07-09 NOTE — ED Notes (Signed)
Patient c/o spasms in her legs onset 2 weeks ago, states no change its just not getting better. C/o jerking in both legs.

## 2019-08-05 DIAGNOSIS — G2581 Restless legs syndrome: Secondary | ICD-10-CM | POA: Diagnosis not present

## 2019-08-05 DIAGNOSIS — M79606 Pain in leg, unspecified: Secondary | ICD-10-CM | POA: Diagnosis not present

## 2019-08-05 DIAGNOSIS — G253 Myoclonus: Secondary | ICD-10-CM | POA: Diagnosis not present

## 2019-08-05 DIAGNOSIS — R7303 Prediabetes: Secondary | ICD-10-CM | POA: Diagnosis not present

## 2019-08-05 DIAGNOSIS — E612 Magnesium deficiency: Secondary | ICD-10-CM | POA: Diagnosis not present

## 2019-08-05 DIAGNOSIS — E559 Vitamin D deficiency, unspecified: Secondary | ICD-10-CM | POA: Diagnosis not present

## 2019-08-05 DIAGNOSIS — S24109S Unspecified injury at unspecified level of thoracic spinal cord, sequela: Secondary | ICD-10-CM | POA: Diagnosis not present

## 2019-08-05 DIAGNOSIS — M818 Other osteoporosis without current pathological fracture: Secondary | ICD-10-CM | POA: Diagnosis not present

## 2019-08-05 DIAGNOSIS — R5383 Other fatigue: Secondary | ICD-10-CM | POA: Diagnosis not present

## 2019-08-05 DIAGNOSIS — D519 Vitamin B12 deficiency anemia, unspecified: Secondary | ICD-10-CM | POA: Diagnosis not present

## 2019-08-05 DIAGNOSIS — E039 Hypothyroidism, unspecified: Secondary | ICD-10-CM | POA: Diagnosis not present

## 2019-09-16 DIAGNOSIS — G253 Myoclonus: Secondary | ICD-10-CM | POA: Diagnosis not present

## 2019-09-16 DIAGNOSIS — M79606 Pain in leg, unspecified: Secondary | ICD-10-CM | POA: Diagnosis not present

## 2019-09-16 DIAGNOSIS — S24109S Unspecified injury at unspecified level of thoracic spinal cord, sequela: Secondary | ICD-10-CM | POA: Diagnosis not present

## 2019-09-16 DIAGNOSIS — G2581 Restless legs syndrome: Secondary | ICD-10-CM | POA: Diagnosis not present

## 2019-10-08 DIAGNOSIS — G8220 Paraplegia, unspecified: Secondary | ICD-10-CM | POA: Diagnosis not present

## 2019-11-07 DIAGNOSIS — G8220 Paraplegia, unspecified: Secondary | ICD-10-CM | POA: Diagnosis not present

## 2019-12-04 DIAGNOSIS — I10 Essential (primary) hypertension: Secondary | ICD-10-CM | POA: Diagnosis not present

## 2019-12-04 DIAGNOSIS — M7918 Myalgia, other site: Secondary | ICD-10-CM | POA: Diagnosis not present

## 2019-12-04 DIAGNOSIS — N390 Urinary tract infection, site not specified: Secondary | ICD-10-CM | POA: Diagnosis not present

## 2019-12-04 DIAGNOSIS — E669 Obesity, unspecified: Secondary | ICD-10-CM | POA: Diagnosis not present

## 2019-12-04 DIAGNOSIS — E785 Hyperlipidemia, unspecified: Secondary | ICD-10-CM | POA: Diagnosis not present

## 2019-12-08 DIAGNOSIS — G8220 Paraplegia, unspecified: Secondary | ICD-10-CM | POA: Diagnosis not present

## 2020-01-08 DIAGNOSIS — G8220 Paraplegia, unspecified: Secondary | ICD-10-CM | POA: Diagnosis not present

## 2020-02-05 DIAGNOSIS — G8220 Paraplegia, unspecified: Secondary | ICD-10-CM | POA: Diagnosis not present

## 2020-03-03 DIAGNOSIS — I10 Essential (primary) hypertension: Secondary | ICD-10-CM | POA: Diagnosis not present

## 2020-03-03 DIAGNOSIS — E669 Obesity, unspecified: Secondary | ICD-10-CM | POA: Diagnosis not present

## 2020-03-03 DIAGNOSIS — M7918 Myalgia, other site: Secondary | ICD-10-CM | POA: Diagnosis not present

## 2020-03-03 DIAGNOSIS — Z0000 Encounter for general adult medical examination without abnormal findings: Secondary | ICD-10-CM | POA: Diagnosis not present

## 2020-03-03 DIAGNOSIS — E785 Hyperlipidemia, unspecified: Secondary | ICD-10-CM | POA: Diagnosis not present

## 2020-03-07 DIAGNOSIS — G8220 Paraplegia, unspecified: Secondary | ICD-10-CM | POA: Diagnosis not present

## 2020-04-06 DIAGNOSIS — G8220 Paraplegia, unspecified: Secondary | ICD-10-CM | POA: Diagnosis not present

## 2020-05-07 DIAGNOSIS — G8220 Paraplegia, unspecified: Secondary | ICD-10-CM | POA: Diagnosis not present

## 2020-06-06 DIAGNOSIS — G8220 Paraplegia, unspecified: Secondary | ICD-10-CM | POA: Diagnosis not present

## 2020-06-17 DIAGNOSIS — Z1331 Encounter for screening for depression: Secondary | ICD-10-CM | POA: Diagnosis not present

## 2020-06-17 DIAGNOSIS — M7918 Myalgia, other site: Secondary | ICD-10-CM | POA: Diagnosis not present

## 2020-06-17 DIAGNOSIS — E785 Hyperlipidemia, unspecified: Secondary | ICD-10-CM | POA: Diagnosis not present

## 2020-06-17 DIAGNOSIS — Z0000 Encounter for general adult medical examination without abnormal findings: Secondary | ICD-10-CM | POA: Diagnosis not present

## 2020-06-17 DIAGNOSIS — I10 Essential (primary) hypertension: Secondary | ICD-10-CM | POA: Diagnosis not present

## 2020-06-17 DIAGNOSIS — N3091 Cystitis, unspecified with hematuria: Secondary | ICD-10-CM | POA: Diagnosis not present

## 2020-07-07 DIAGNOSIS — G8220 Paraplegia, unspecified: Secondary | ICD-10-CM | POA: Diagnosis not present

## 2020-08-07 DIAGNOSIS — G8220 Paraplegia, unspecified: Secondary | ICD-10-CM | POA: Diagnosis not present

## 2020-09-06 DIAGNOSIS — G8220 Paraplegia, unspecified: Secondary | ICD-10-CM | POA: Diagnosis not present

## 2020-09-28 DIAGNOSIS — I10 Essential (primary) hypertension: Secondary | ICD-10-CM | POA: Diagnosis not present

## 2020-09-28 DIAGNOSIS — N3091 Cystitis, unspecified with hematuria: Secondary | ICD-10-CM | POA: Diagnosis not present

## 2020-09-28 DIAGNOSIS — Z0000 Encounter for general adult medical examination without abnormal findings: Secondary | ICD-10-CM | POA: Diagnosis not present

## 2020-09-28 DIAGNOSIS — E785 Hyperlipidemia, unspecified: Secondary | ICD-10-CM | POA: Diagnosis not present

## 2020-09-28 DIAGNOSIS — M7918 Myalgia, other site: Secondary | ICD-10-CM | POA: Diagnosis not present

## 2020-09-28 DIAGNOSIS — Z1331 Encounter for screening for depression: Secondary | ICD-10-CM | POA: Diagnosis not present

## 2020-10-07 DIAGNOSIS — G8220 Paraplegia, unspecified: Secondary | ICD-10-CM | POA: Diagnosis not present

## 2021-02-03 DIAGNOSIS — E785 Hyperlipidemia, unspecified: Secondary | ICD-10-CM | POA: Diagnosis not present

## 2021-02-03 DIAGNOSIS — I10 Essential (primary) hypertension: Secondary | ICD-10-CM | POA: Diagnosis not present

## 2021-02-03 DIAGNOSIS — M7918 Myalgia, other site: Secondary | ICD-10-CM | POA: Diagnosis not present

## 2021-03-28 DIAGNOSIS — I700 Atherosclerosis of aorta: Secondary | ICD-10-CM | POA: Diagnosis not present

## 2021-03-28 DIAGNOSIS — G8220 Paraplegia, unspecified: Secondary | ICD-10-CM | POA: Diagnosis not present

## 2021-03-28 DIAGNOSIS — K760 Fatty (change of) liver, not elsewhere classified: Secondary | ICD-10-CM | POA: Diagnosis not present

## 2021-03-28 DIAGNOSIS — R1110 Vomiting, unspecified: Secondary | ICD-10-CM | POA: Diagnosis not present

## 2021-03-28 DIAGNOSIS — R109 Unspecified abdominal pain: Secondary | ICD-10-CM | POA: Diagnosis not present

## 2021-03-28 DIAGNOSIS — R1031 Right lower quadrant pain: Secondary | ICD-10-CM | POA: Diagnosis not present

## 2021-03-28 DIAGNOSIS — K5900 Constipation, unspecified: Secondary | ICD-10-CM | POA: Diagnosis not present

## 2021-06-02 DIAGNOSIS — Z0000 Encounter for general adult medical examination without abnormal findings: Secondary | ICD-10-CM | POA: Diagnosis not present

## 2021-06-02 DIAGNOSIS — I10 Essential (primary) hypertension: Secondary | ICD-10-CM | POA: Diagnosis not present

## 2021-06-02 DIAGNOSIS — E785 Hyperlipidemia, unspecified: Secondary | ICD-10-CM | POA: Diagnosis not present

## 2021-06-02 DIAGNOSIS — M7918 Myalgia, other site: Secondary | ICD-10-CM | POA: Diagnosis not present

## 2021-08-03 DIAGNOSIS — I10 Essential (primary) hypertension: Secondary | ICD-10-CM | POA: Diagnosis not present

## 2021-08-03 DIAGNOSIS — E785 Hyperlipidemia, unspecified: Secondary | ICD-10-CM | POA: Diagnosis not present

## 2021-09-19 DIAGNOSIS — I700 Atherosclerosis of aorta: Secondary | ICD-10-CM | POA: Diagnosis not present

## 2021-09-19 DIAGNOSIS — M7918 Myalgia, other site: Secondary | ICD-10-CM | POA: Diagnosis not present

## 2021-09-19 DIAGNOSIS — E785 Hyperlipidemia, unspecified: Secondary | ICD-10-CM | POA: Diagnosis not present

## 2021-09-19 DIAGNOSIS — E669 Obesity, unspecified: Secondary | ICD-10-CM | POA: Diagnosis not present

## 2021-09-19 DIAGNOSIS — E1169 Type 2 diabetes mellitus with other specified complication: Secondary | ICD-10-CM | POA: Diagnosis not present

## 2021-09-19 DIAGNOSIS — Z0000 Encounter for general adult medical examination without abnormal findings: Secondary | ICD-10-CM | POA: Diagnosis not present

## 2021-09-19 DIAGNOSIS — E119 Type 2 diabetes mellitus without complications: Secondary | ICD-10-CM | POA: Diagnosis not present

## 2021-09-19 DIAGNOSIS — I10 Essential (primary) hypertension: Secondary | ICD-10-CM | POA: Diagnosis not present

## 2021-10-13 ENCOUNTER — Ambulatory Visit: Payer: Medicare HMO | Admitting: Adult Health

## 2021-10-13 ENCOUNTER — Encounter: Payer: Self-pay | Admitting: Adult Health

## 2021-10-13 ENCOUNTER — Other Ambulatory Visit (HOSPITAL_COMMUNITY)
Admission: RE | Admit: 2021-10-13 | Discharge: 2021-10-13 | Disposition: A | Discharge status: Home / Self Care | Payer: Medicare HMO | Source: Ambulatory Visit | Attending: Adult Health | Admitting: Adult Health

## 2021-10-13 VITALS — BP 125/78 | HR 66

## 2021-10-13 DIAGNOSIS — Z1151 Encounter for screening for human papillomavirus (HPV): Secondary | ICD-10-CM | POA: Diagnosis not present

## 2021-10-13 DIAGNOSIS — Z01419 Encounter for gynecological examination (general) (routine) without abnormal findings: Secondary | ICD-10-CM | POA: Diagnosis not present

## 2021-10-13 DIAGNOSIS — R935 Abnormal findings on diagnostic imaging of other abdominal regions, including retroperitoneum: Secondary | ICD-10-CM | POA: Diagnosis not present

## 2021-10-13 DIAGNOSIS — G8220 Paraplegia, unspecified: Secondary | ICD-10-CM | POA: Diagnosis not present

## 2021-10-13 NOTE — Progress Notes (Signed)
  Subjective:     Patient ID: Debbie [name], female   DOB: [birth year], 45 y.o.   MRN: [id number]  HPI Debbie [name] is a 45 year old white female, separated with DP, [id number], in for abnormal CT in April, showed cervical mass. She has periods that may last 1-3 weeks and hot hot flashes and is down at times. She is a paraplegic had tumor on spine and surgery left her this way. 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  PCP is Dr Sherrie Sport.  Review of Systems Had abnormal CT in April See HPI for positives. Reviewed past medical,surgical, social and family history. Reviewed medications and allergies.     Objective:   Physical Exam BP 125/78 (BP Location: Left Arm, Patient Position: Sitting, Cuff Size: Large)   Pulse 66   LMP 10/01/2021 (Approximate)   Skin warm and dry.Pelvic: external genitalia is normal in appearance no lesions, vagina: pink,urethra has no lesions or masses noted, cervix: bulbous,poorly visualized, pap with HR HPV genotyping performed, uterus: normal size, shape and contour, non tender, no masses felt, adnexa: no masses or tenderness noted. Bladder is non tender and no masses felt. CT was in [place] at [facility].   1. Abnormal appearance to the uterus with fluid distending the lower  endometrial canal and soft tissue cervical mass measuring 3.9 x 2.8  cm. Recommend gyn assessment for cervical malignancy.  2. No additional acute findings in the abdomen or pelvis.  AA is 0 Fall risk is low Depression screen [facility] 2/9 10/13/2021 07/31/2016  Decreased Interest 0 0  Down, Depressed, Hopeless 0 0  PHQ - 2 Score 0 0  Altered sleeping 2 -  Tired, decreased energy 2 -  Change in appetite 3 -  Feeling bad or failure about yourself  3 -  Trouble concentrating 0 -  Moving slowly or fidgety/restless 0 -  Suicidal thoughts 0 -  PHQ-9 Score 10 -    GAD 7 : Generalized Anxiety Score 10/13/2021  Nervous, Anxious, on Edge 0  Control/stop worrying 0  Worry too much - different things 0  Trouble relaxing  0  Restless 0  Easily annoyed or irritable 0  Afraid - awful might happen 0  Total GAD 7 Score 0    Upstream - 10/13/21 1009       Pregnancy Intention Screening   Does the patient want to become pregnant in the next year? No    Does the patient's partner want to become pregnant in the next year? No    Would the patient like to discuss contraceptive options today? No      Contraception Wrap Up   Current Method No Method - Other Reason    End Method No Method - Other Reason    Contraception Counseling Provided No            Examination chaperoned by Levy Pupa LPN   Assessment:     1. Encounter for cervical Pap smear with pelvic exam Pap sent  If normal repeat in 3 years  - Cytology - PAP( [facility])  2. Abnormal CT scan, pelvis Will get [place] to assess uterus and cervix better Scheduled for 10/17/21 at 12N in office  - US PELVIC COMPLETE WITH TRANSVAGINAL; Future  3. Paraplegia ([place])     Plan:     Will talk when [place] results back

## 2021-10-14 LAB — CYTOLOGY - PAP
Adequacy: ABSENT
Comment: NEGATIVE
Diagnosis: NEGATIVE
High risk HPV: NEGATIVE

## 2021-10-17 ENCOUNTER — Ambulatory Visit (INDEPENDENT_AMBULATORY_CARE_PROVIDER_SITE_OTHER): Payer: Medicare HMO

## 2021-10-17 ENCOUNTER — Other Ambulatory Visit: Payer: Self-pay

## 2021-10-17 DIAGNOSIS — R935 Abnormal findings on diagnostic imaging of other abdominal regions, including retroperitoneum: Secondary | ICD-10-CM

## 2021-10-17 NOTE — Progress Notes (Signed)
PELVIC US TA/TV: ante/retroflexed uterus,posterior intramural fibroid 2.9 x 2.4 x 2.9 cm,EEC 8 mm,normal ovaries,left ovary only visualized on transabdominal images,no free fluid,no pain during ultrasound,very limited ultrasound

## 2021-10-19 ENCOUNTER — Telehealth: Payer: Self-pay | Admitting: Adult Health

## 2021-10-19 DIAGNOSIS — D219 Benign neoplasm of connective and other soft tissue, unspecified: Secondary | ICD-10-CM

## 2021-10-19 NOTE — Telephone Encounter (Signed)
I called patient and informed her that the pap smear was normal but ultrasound hasn't been read. I told her that I would send a message to Anderson [place] letting her know that she was wanting the results. Patient had no other questions at this time and will wait to hear from [facility].

## 2021-10-19 NOTE — Telephone Encounter (Signed)
Patient called stating that she would like to know the results of her pap and her [place]. Please contact pt

## 2021-10-20 ENCOUNTER — Encounter: Payer: Self-pay | Admitting: Adult Health

## 2021-10-20 ENCOUNTER — Telehealth: Payer: Self-pay | Admitting: Adult Health

## 2021-10-20 DIAGNOSIS — D219 Benign neoplasm of connective and other soft tissue, unspecified: Secondary | ICD-10-CM

## 2021-10-20 HISTORY — DX: Benign neoplasm of connective and other soft tissue, unspecified: D21.9

## 2021-10-20 MED ORDER — NORETHINDRONE 0.35 MG PO TABS: 1.0000 | ORAL_TABLET | Freq: Every day | ORAL | 11 refills | Status: DC

## 2021-10-20 NOTE — Telephone Encounter (Signed)
Pt aware of [place] and small fibroid, has heavy periods, will try Micronor to see if helps, does not want to gain weight

## 2021-10-20 NOTE — Telephone Encounter (Signed)
Mail box is full if she calls US showed small fibroid, other wise normal

## 2021-12-29 DIAGNOSIS — M7918 Myalgia, other site: Secondary | ICD-10-CM | POA: Diagnosis not present

## 2021-12-29 DIAGNOSIS — E669 Obesity, unspecified: Secondary | ICD-10-CM | POA: Diagnosis not present

## 2021-12-29 DIAGNOSIS — E1169 Type 2 diabetes mellitus with other specified complication: Secondary | ICD-10-CM | POA: Diagnosis not present

## 2021-12-29 DIAGNOSIS — I700 Atherosclerosis of aorta: Secondary | ICD-10-CM | POA: Diagnosis not present

## 2021-12-29 DIAGNOSIS — I10 Essential (primary) hypertension: Secondary | ICD-10-CM | POA: Diagnosis not present

## 2021-12-29 DIAGNOSIS — E785 Hyperlipidemia, unspecified: Secondary | ICD-10-CM | POA: Diagnosis not present

## 2021-12-29 DIAGNOSIS — E1165 Type 2 diabetes mellitus with hyperglycemia: Secondary | ICD-10-CM | POA: Diagnosis not present

## 2022-04-22 DIAGNOSIS — G8220 Paraplegia, unspecified: Secondary | ICD-10-CM | POA: Diagnosis not present

## 2022-04-22 DIAGNOSIS — E663 Overweight: Secondary | ICD-10-CM | POA: Diagnosis not present

## 2022-04-22 DIAGNOSIS — R112 Nausea with vomiting, unspecified: Secondary | ICD-10-CM | POA: Diagnosis not present

## 2022-04-22 DIAGNOSIS — E1110 Type 2 diabetes mellitus with ketoacidosis without coma: Secondary | ICD-10-CM | POA: Diagnosis not present

## 2022-04-22 DIAGNOSIS — I10 Essential (primary) hypertension: Secondary | ICD-10-CM | POA: Diagnosis not present

## 2022-04-22 DIAGNOSIS — Z6833 Body mass index (BMI) 33.0-33.9, adult: Secondary | ICD-10-CM | POA: Diagnosis not present

## 2022-04-22 DIAGNOSIS — E1010 Type 1 diabetes mellitus with ketoacidosis without coma: Secondary | ICD-10-CM | POA: Diagnosis not present

## 2022-04-22 DIAGNOSIS — D509 Iron deficiency anemia, unspecified: Secondary | ICD-10-CM | POA: Diagnosis not present

## 2022-04-22 DIAGNOSIS — E668 Other obesity: Secondary | ICD-10-CM | POA: Diagnosis not present

## 2022-04-22 DIAGNOSIS — D72829 Elevated white blood cell count, unspecified: Secondary | ICD-10-CM | POA: Diagnosis not present

## 2022-04-27 DIAGNOSIS — E1110 Type 2 diabetes mellitus with ketoacidosis without coma: Secondary | ICD-10-CM | POA: Diagnosis not present

## 2022-07-26 DIAGNOSIS — Z0000 Encounter for general adult medical examination without abnormal findings: Secondary | ICD-10-CM | POA: Diagnosis not present

## 2022-07-26 DIAGNOSIS — E669 Obesity, unspecified: Secondary | ICD-10-CM | POA: Diagnosis not present

## 2022-07-26 DIAGNOSIS — E1110 Type 2 diabetes mellitus with ketoacidosis without coma: Secondary | ICD-10-CM | POA: Diagnosis not present

## 2022-07-26 DIAGNOSIS — I10 Essential (primary) hypertension: Secondary | ICD-10-CM | POA: Diagnosis not present

## 2022-07-26 DIAGNOSIS — I700 Atherosclerosis of aorta: Secondary | ICD-10-CM | POA: Diagnosis not present

## 2022-07-26 DIAGNOSIS — M7918 Myalgia, other site: Secondary | ICD-10-CM | POA: Diagnosis not present

## 2022-11-13 ENCOUNTER — Other Ambulatory Visit: Payer: Self-pay | Admitting: Adult Health

## 2022-11-22 DIAGNOSIS — M7918 Myalgia, other site: Secondary | ICD-10-CM | POA: Diagnosis not present

## 2022-11-22 DIAGNOSIS — Z0000 Encounter for general adult medical examination without abnormal findings: Secondary | ICD-10-CM | POA: Diagnosis not present

## 2022-11-22 DIAGNOSIS — E785 Hyperlipidemia, unspecified: Secondary | ICD-10-CM | POA: Diagnosis not present

## 2022-11-22 DIAGNOSIS — I10 Essential (primary) hypertension: Secondary | ICD-10-CM | POA: Diagnosis not present

## 2022-11-22 DIAGNOSIS — I700 Atherosclerosis of aorta: Secondary | ICD-10-CM | POA: Diagnosis not present

## 2022-11-22 DIAGNOSIS — E669 Obesity, unspecified: Secondary | ICD-10-CM | POA: Diagnosis not present

## 2022-11-22 DIAGNOSIS — E1169 Type 2 diabetes mellitus with other specified complication: Secondary | ICD-10-CM | POA: Diagnosis not present

## 2022-12-19 DIAGNOSIS — N179 Acute kidney failure, unspecified: Secondary | ICD-10-CM | POA: Diagnosis not present

## 2022-12-19 DIAGNOSIS — G8220 Paraplegia, unspecified: Secondary | ICD-10-CM | POA: Diagnosis not present

## 2022-12-19 DIAGNOSIS — N390 Urinary tract infection, site not specified: Secondary | ICD-10-CM | POA: Diagnosis not present

## 2022-12-19 DIAGNOSIS — R109 Unspecified abdominal pain: Secondary | ICD-10-CM | POA: Diagnosis not present

## 2022-12-19 DIAGNOSIS — A021 Salmonella sepsis: Secondary | ICD-10-CM | POA: Diagnosis not present

## 2022-12-19 DIAGNOSIS — E876 Hypokalemia: Secondary | ICD-10-CM | POA: Diagnosis not present

## 2022-12-19 DIAGNOSIS — D509 Iron deficiency anemia, unspecified: Secondary | ICD-10-CM | POA: Diagnosis not present

## 2022-12-19 DIAGNOSIS — N3000 Acute cystitis without hematuria: Secondary | ICD-10-CM | POA: Diagnosis not present

## 2022-12-19 DIAGNOSIS — Z794 Long term (current) use of insulin: Secondary | ICD-10-CM | POA: Diagnosis not present

## 2022-12-19 DIAGNOSIS — B9689 Other specified bacterial agents as the cause of diseases classified elsewhere: Secondary | ICD-10-CM | POA: Diagnosis not present

## 2022-12-19 DIAGNOSIS — E119 Type 2 diabetes mellitus without complications: Secondary | ICD-10-CM | POA: Diagnosis not present

## 2022-12-19 DIAGNOSIS — N10 Acute tubulo-interstitial nephritis: Secondary | ICD-10-CM | POA: Diagnosis not present

## 2022-12-19 DIAGNOSIS — R112 Nausea with vomiting, unspecified: Secondary | ICD-10-CM | POA: Diagnosis not present

## 2022-12-19 DIAGNOSIS — E662 Morbid (severe) obesity with alveolar hypoventilation: Secondary | ICD-10-CM | POA: Diagnosis not present

## 2022-12-19 DIAGNOSIS — R7881 Bacteremia: Secondary | ICD-10-CM | POA: Diagnosis not present

## 2022-12-19 DIAGNOSIS — K8020 Calculus of gallbladder without cholecystitis without obstruction: Secondary | ICD-10-CM | POA: Diagnosis not present

## 2022-12-19 DIAGNOSIS — A029 Salmonella infection, unspecified: Secondary | ICD-10-CM | POA: Diagnosis not present

## 2022-12-19 DIAGNOSIS — A419 Sepsis, unspecified organism: Secondary | ICD-10-CM | POA: Diagnosis not present

## 2022-12-19 DIAGNOSIS — B9620 Unspecified Escherichia coli [E. coli] as the cause of diseases classified elsewhere: Secondary | ICD-10-CM | POA: Diagnosis not present

## 2022-12-19 DIAGNOSIS — R197 Diarrhea, unspecified: Secondary | ICD-10-CM | POA: Diagnosis not present

## 2022-12-19 DIAGNOSIS — N170 Acute kidney failure with tubular necrosis: Secondary | ICD-10-CM | POA: Diagnosis not present

## 2022-12-19 DIAGNOSIS — I10 Essential (primary) hypertension: Secondary | ICD-10-CM | POA: Diagnosis not present

## 2022-12-19 DIAGNOSIS — K810 Acute cholecystitis: Secondary | ICD-10-CM | POA: Diagnosis not present

## 2022-12-19 DIAGNOSIS — E785 Hyperlipidemia, unspecified: Secondary | ICD-10-CM | POA: Diagnosis not present

## 2022-12-19 DIAGNOSIS — Z20822 Contact with and (suspected) exposure to covid-19: Secondary | ICD-10-CM | POA: Diagnosis not present

## 2022-12-23 ENCOUNTER — Inpatient Hospital Stay: Admit: 2022-12-23 | Payer: Medicare HMO | Admitting: Internal Medicine

## 2022-12-23 ENCOUNTER — Encounter (HOSPITAL_COMMUNITY): Payer: Self-pay

## 2022-12-24 ENCOUNTER — Inpatient Hospital Stay (HOSPITAL_COMMUNITY)
Admission: EM | Admit: 2022-12-24 | Discharge: 2022-12-31 | DRG: 872 | Disposition: A | Discharge status: Home / Self Care | Payer: Medicare HMO | Source: Ambulatory Visit | Attending: Internal Medicine | Admitting: Internal Medicine

## 2022-12-24 ENCOUNTER — Other Ambulatory Visit: Payer: Self-pay

## 2022-12-24 ENCOUNTER — Inpatient Hospital Stay (HOSPITAL_COMMUNITY): Payer: Medicare HMO

## 2022-12-24 ENCOUNTER — Encounter (HOSPITAL_COMMUNITY): Payer: Self-pay | Admitting: Internal Medicine

## 2022-12-24 DIAGNOSIS — R7881 Bacteremia: Secondary | ICD-10-CM | POA: Diagnosis not present

## 2022-12-24 DIAGNOSIS — A021 Salmonella sepsis: Principal | ICD-10-CM | POA: Diagnosis present

## 2022-12-24 DIAGNOSIS — E876 Hypokalemia: Secondary | ICD-10-CM | POA: Diagnosis not present

## 2022-12-24 DIAGNOSIS — L89151 Pressure ulcer of sacral region, stage 1: Secondary | ICD-10-CM | POA: Diagnosis not present

## 2022-12-24 DIAGNOSIS — I10 Essential (primary) hypertension: Secondary | ICD-10-CM | POA: Diagnosis present

## 2022-12-24 DIAGNOSIS — R6520 Severe sepsis without septic shock: Secondary | ICD-10-CM | POA: Diagnosis not present

## 2022-12-24 DIAGNOSIS — N2882 Megaloureter: Secondary | ICD-10-CM | POA: Diagnosis not present

## 2022-12-24 DIAGNOSIS — K8063 Calculus of gallbladder and bile duct with acute cholecystitis with obstruction: Secondary | ICD-10-CM | POA: Diagnosis present

## 2022-12-24 DIAGNOSIS — L8990 Pressure ulcer of unspecified site, unspecified stage: Secondary | ICD-10-CM | POA: Insufficient documentation

## 2022-12-24 DIAGNOSIS — K82A2 Perforation of gallbladder in cholecystitis: Secondary | ICD-10-CM | POA: Diagnosis present

## 2022-12-24 DIAGNOSIS — K922 Gastrointestinal hemorrhage, unspecified: Secondary | ICD-10-CM | POA: Diagnosis not present

## 2022-12-24 DIAGNOSIS — K6389 Other specified diseases of intestine: Secondary | ICD-10-CM | POA: Diagnosis not present

## 2022-12-24 DIAGNOSIS — Y838 Other surgical procedures as the cause of abnormal reaction of the patient, or of later complication, without mention of misadventure at the time of the procedure: Secondary | ICD-10-CM | POA: Diagnosis present

## 2022-12-24 DIAGNOSIS — Z87891 Personal history of nicotine dependence: Secondary | ICD-10-CM | POA: Diagnosis not present

## 2022-12-24 DIAGNOSIS — N3000 Acute cystitis without hematuria: Secondary | ICD-10-CM

## 2022-12-24 DIAGNOSIS — R188 Other ascites: Secondary | ICD-10-CM | POA: Diagnosis present

## 2022-12-24 DIAGNOSIS — N179 Acute kidney failure, unspecified: Secondary | ICD-10-CM | POA: Diagnosis not present

## 2022-12-24 DIAGNOSIS — G8220 Paraplegia, unspecified: Secondary | ICD-10-CM | POA: Diagnosis not present

## 2022-12-24 DIAGNOSIS — E669 Obesity, unspecified: Secondary | ICD-10-CM | POA: Diagnosis present

## 2022-12-24 DIAGNOSIS — R112 Nausea with vomiting, unspecified: Secondary | ICD-10-CM | POA: Diagnosis not present

## 2022-12-24 DIAGNOSIS — Z6837 Body mass index (BMI) 37.0-37.9, adult: Secondary | ICD-10-CM

## 2022-12-24 DIAGNOSIS — E1165 Type 2 diabetes mellitus with hyperglycemia: Secondary | ICD-10-CM | POA: Diagnosis not present

## 2022-12-24 DIAGNOSIS — E119 Type 2 diabetes mellitus without complications: Secondary | ICD-10-CM | POA: Diagnosis not present

## 2022-12-24 DIAGNOSIS — R52 Pain, unspecified: Secondary | ICD-10-CM | POA: Diagnosis present

## 2022-12-24 DIAGNOSIS — Z820 Family history of epilepsy and other diseases of the nervous system: Secondary | ICD-10-CM

## 2022-12-24 DIAGNOSIS — G9782 Other postprocedural complications and disorders of nervous system: Secondary | ICD-10-CM | POA: Diagnosis not present

## 2022-12-24 DIAGNOSIS — K8309 Other cholangitis: Secondary | ICD-10-CM | POA: Diagnosis not present

## 2022-12-24 DIAGNOSIS — K8020 Calculus of gallbladder without cholecystitis without obstruction: Secondary | ICD-10-CM | POA: Diagnosis not present

## 2022-12-24 DIAGNOSIS — A029 Salmonella infection, unspecified: Secondary | ICD-10-CM | POA: Diagnosis present

## 2022-12-24 DIAGNOSIS — R109 Unspecified abdominal pain: Secondary | ICD-10-CM | POA: Diagnosis not present

## 2022-12-24 DIAGNOSIS — Z7984 Long term (current) use of oral hypoglycemic drugs: Secondary | ICD-10-CM

## 2022-12-24 DIAGNOSIS — R197 Diarrhea, unspecified: Secondary | ICD-10-CM | POA: Diagnosis not present

## 2022-12-24 DIAGNOSIS — K810 Acute cholecystitis: Principal | ICD-10-CM | POA: Diagnosis present

## 2022-12-24 DIAGNOSIS — M25511 Pain in right shoulder: Secondary | ICD-10-CM | POA: Diagnosis not present

## 2022-12-24 DIAGNOSIS — K828 Other specified diseases of gallbladder: Secondary | ICD-10-CM | POA: Diagnosis present

## 2022-12-24 DIAGNOSIS — N318 Other neuromuscular dysfunction of bladder: Secondary | ICD-10-CM | POA: Diagnosis present

## 2022-12-24 DIAGNOSIS — Z91030 Bee allergy status: Secondary | ICD-10-CM

## 2022-12-24 DIAGNOSIS — Z833 Family history of diabetes mellitus: Secondary | ICD-10-CM | POA: Diagnosis not present

## 2022-12-24 DIAGNOSIS — Z794 Long term (current) use of insulin: Secondary | ICD-10-CM

## 2022-12-24 DIAGNOSIS — K8000 Calculus of gallbladder with acute cholecystitis without obstruction: Secondary | ICD-10-CM | POA: Diagnosis present

## 2022-12-24 DIAGNOSIS — J9811 Atelectasis: Secondary | ICD-10-CM | POA: Diagnosis not present

## 2022-12-24 DIAGNOSIS — N136 Pyonephrosis: Secondary | ICD-10-CM | POA: Diagnosis present

## 2022-12-24 DIAGNOSIS — Z79899 Other long term (current) drug therapy: Secondary | ICD-10-CM

## 2022-12-24 DIAGNOSIS — D500 Iron deficiency anemia secondary to blood loss (chronic): Secondary | ICD-10-CM | POA: Diagnosis present

## 2022-12-24 DIAGNOSIS — Z87892 Personal history of anaphylaxis: Secondary | ICD-10-CM

## 2022-12-24 DIAGNOSIS — N1330 Unspecified hydronephrosis: Secondary | ICD-10-CM | POA: Diagnosis not present

## 2022-12-24 DIAGNOSIS — Z9359 Other cystostomy status: Secondary | ICD-10-CM | POA: Diagnosis not present

## 2022-12-24 DIAGNOSIS — Z1611 Resistance to penicillins: Secondary | ICD-10-CM | POA: Diagnosis present

## 2022-12-24 DIAGNOSIS — I700 Atherosclerosis of aorta: Secondary | ICD-10-CM | POA: Diagnosis not present

## 2022-12-24 DIAGNOSIS — N2889 Other specified disorders of kidney and ureter: Secondary | ICD-10-CM | POA: Diagnosis not present

## 2022-12-24 DIAGNOSIS — G35 Multiple sclerosis: Secondary | ICD-10-CM | POA: Diagnosis present

## 2022-12-24 DIAGNOSIS — Z888 Allergy status to other drugs, medicaments and biological substances status: Secondary | ICD-10-CM

## 2022-12-24 DIAGNOSIS — A419 Sepsis, unspecified organism: Secondary | ICD-10-CM | POA: Diagnosis not present

## 2022-12-24 LAB — CBC WITH DIFFERENTIAL/PLATELET
Abs Immature Granulocytes: 0.39 10*3/uL — ABNORMAL HIGH (ref 0.00–0.07)
Basophils Absolute: 0 10*3/uL (ref 0.0–0.1)
Basophils Relative: 0 %
Eosinophils Absolute: 0.4 10*3/uL (ref 0.0–0.5)
Eosinophils Relative: 2 %
HCT: 24.8 % — ABNORMAL LOW (ref 36.0–46.0)
Hemoglobin: 7.4 g/dL — ABNORMAL LOW (ref 12.0–15.0)
Immature Granulocytes: 3 %
Lymphocytes Relative: 14 %
Lymphs Abs: 2.2 10*3/uL (ref 0.7–4.0)
MCH: 19.6 pg — ABNORMAL LOW (ref 26.0–34.0)
MCHC: 29.8 g/dL — ABNORMAL LOW (ref 30.0–36.0)
MCV: 65.6 fL — ABNORMAL LOW (ref 80.0–100.0)
Monocytes Absolute: 2.1 10*3/uL — ABNORMAL HIGH (ref 0.1–1.0)
Monocytes Relative: 13 %
Neutro Abs: 10.6 10*3/uL — ABNORMAL HIGH (ref 1.7–7.7)
Neutrophils Relative %: 68 %
Platelets: 281 10*3/uL (ref 150–400)
RBC: 3.78 MIL/uL — ABNORMAL LOW (ref 3.87–5.11)
RDW: 21.3 % — ABNORMAL HIGH (ref 11.5–15.5)
WBC: 15.6 10*3/uL — ABNORMAL HIGH (ref 4.0–10.5)
nRBC: 0 % (ref 0.0–0.2)

## 2022-12-24 LAB — SEDIMENTATION RATE: Sed Rate: 90 mm/hr — ABNORMAL HIGH (ref 0–22)

## 2022-12-24 LAB — LACTIC ACID, PLASMA: Lactic Acid, Venous: 0.9 mmol/L (ref 0.5–1.9)

## 2022-12-24 LAB — PROTIME-INR
INR: 1.2 (ref 0.8–1.2)
Prothrombin Time: 14.8 seconds (ref 11.4–15.2)

## 2022-12-24 LAB — TSH: TSH: 1.101 u[IU]/mL (ref 0.350–4.500)

## 2022-12-24 MED ORDER — SODIUM CHLORIDE 0.9% FLUSH
10.0000 mL | INTRAVENOUS | Status: DC | PRN
  Administered 2022-12-24: 30 mL

## 2022-12-24 MED ORDER — HYDROCODONE-ACETAMINOPHEN 5-325 MG PO TABS
1.0000 | ORAL_TABLET | ORAL | Status: DC | PRN
  Administered 2022-12-24 – 2022-12-30 (×22): 2 via ORAL
  Administered 2022-12-31: 1 via ORAL
  Administered 2022-12-31: 2 via ORAL
  Filled 2022-12-24 (×5): qty 2
  Filled 2022-12-24: qty 1
  Filled 2022-12-24 (×18): qty 2

## 2022-12-24 MED ORDER — PANTOPRAZOLE SODIUM 40 MG IV SOLR
40.0000 mg | Freq: Two times a day (BID) | INTRAVENOUS | Status: DC
  Administered 2022-12-24 – 2022-12-25 (×2): 40 mg via INTRAVENOUS
  Filled 2022-12-24 (×2): qty 10

## 2022-12-24 MED ORDER — KCL IN DEXTROSE-NACL 20-5-0.2 MEQ/L-%-% IV SOLN
INTRAVENOUS | Status: DC
  Filled 2022-12-24 (×5): qty 1000

## 2022-12-24 MED ORDER — ONDANSETRON HCL 4 MG/2ML IJ SOLN
4.0000 mg | Freq: Four times a day (QID) | INTRAMUSCULAR | Status: DC | PRN
  Administered 2022-12-26 – 2022-12-29 (×7): 4 mg via INTRAVENOUS
  Filled 2022-12-24 (×7): qty 2

## 2022-12-24 MED ORDER — ALBUTEROL SULFATE (2.5 MG/3ML) 0.083% IN NEBU: 2.5000 mg | INHALATION_SOLUTION | RESPIRATORY_TRACT | Status: DC | PRN

## 2022-12-24 MED ORDER — CHLORHEXIDINE GLUCONATE CLOTH 2 % EX PADS
6.0000 | MEDICATED_PAD | Freq: Every day | CUTANEOUS | Status: DC
  Administered 2022-12-24 – 2022-12-30 (×7): 6 via TOPICAL

## 2022-12-24 MED ORDER — FENTANYL CITRATE PF 50 MCG/ML IJ SOSY
12.5000 ug | PREFILLED_SYRINGE | INTRAMUSCULAR | Status: DC | PRN
  Administered 2022-12-25 – 2022-12-30 (×6): 12.5 ug via INTRAVENOUS
  Filled 2022-12-24 (×6): qty 1

## 2022-12-24 MED ORDER — ONDANSETRON HCL 4 MG PO TABS: 4.0000 mg | ORAL_TABLET | Freq: Four times a day (QID) | ORAL | Status: DC | PRN

## 2022-12-24 NOTE — H&P (Signed)
History and Physical    Debbie [name] UVO:[id number] DOB: [birth year] DOA: 12/24/2022  PCP: Neale Burly, MD  Patient coming from: [facility]  I have personally briefly reviewed patient's old medical records in [place]  Chief Complaint: Acute cystitis/acute cholecystitis/ acute renal failure 12/20/22 abdominal pain Raechel Ache days fever/chills/diarrhea/ nausea  HPI: Debbie [name] is a 47 y.o. 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  Prior to presentation she was evaluated by pcp and initially thought to have had the flu. Patient noted he symptoms persisted and she presented to OSH with increase n/v/d/fatigue and abdominal pain. Patient was admited on 12/20/22 with diagnoses of Eoli UTI,AKI.  Patient course complicated by blood culture that was positive for Salmonella species. RUQ u/s was obtained and noted acute cholecystitis.  Patient course was further complicated by worsening renal failure with already elevated cr on at admit of 1.7 increasing to 3.12. Patient was on lisinopril and toradol and these medications were discontinued. Per notes patient has further complication of 1 large episode of rectal bleeding s/p whch hgb dropped from 9.2 on admission to 6.8. At time patient was transfused 1 units of prbc.  Patient was initially treated with ctx and metronidazole and then then ctx switched to Levaquin due to + Salmonella blood clutures.  Due to  Need for cholecystectomy ,insetting of progressive renal failure, GI Bleed,  Salmonella bacteremia patient was transferred to [facility] for further specialty care . Patient currently noted continued abdominal pain , but noted no current n/v. She denies any further brbpr. She has had BM on admit w/o any blood noted or blacks stools per patient.  She notes no sob or chest pain ED Course:  12/20/22  CT abd/pelvis IMPRESSION: 1. Query gallbladder wall thickening and pericholecystic fluid. No CT finding of cholelithiasis. Recommend right upper quadrant ultrasound for further evaluation. 2. Bilateral femoral head avascular necrosis. 3. Aortic Atherosclerosis (ICD10-I70.0).   RUQ U/S Acute cholecystitis with cholelithiasis and layering sludge.   Blood culture 12/20/22 Salmonella   Lactic 2 * 12/20/22  UA :+ large bacteria /+ blood : Urine culture ecoli ( 12/22/22) Sensitive to : CTX,( resistant to ampicillin, cefuroxime)  Resp panel neg   Most recent labs  Result Value Ref Range  Glucose, POC 87 70 - 105 mg/dL  Collection Time: 12/23/22 1:36 PM  Specimen: Blood Bag; Red Blood Cell Product  Result Value Ref Range   Culture Culture in Progress  Gram Stain Result No organisms seen  Gram Stain Result No White Blood Cells/LPF  Gram Stain Result 1+ Red Blood Cells /LPF  Gram Stain Result No Squamous Epithelial Cells/LPF  BUN  Collection Time: 12/23/22 3:35 PM  Result Value Ref Range  BUN 49 (H) 8 - 20 mg/dL  Creatinine  Collection Time: 12/23/22 3:35 PM  Result Value Ref Range  Creatinine 3.25 (H) 0.60 - 1.10 mg/dL  eGFR CKD-EPI (2021) Female 17 (L) >=60 mL/min/1.89m  Lactate dehydrogenase  Collection Time: 12/23/22 3:35 PM  Result Value Ref Range  LDH 234 120 - 246 U/L  Urinalysis with Microscopy  Collection Time: 12/23/22 3:40 PM  Result Value Ref Range  Color, UA Amber  Clarity, UA Clear Clear  Specific Gravity, UA >=1.030 (H) 1.010 - 1.025  pH, UA 5.0 5.0 - 8.0  Leukocyte Esterase, UA Negative Negative  Nitrite, UA Positive (A) Negative  Protein, UA 100 mg/dL (A) Negative  Glucose, UA Negative Negative  Ketones, UA Trace (A) Negative  Urobilinogen, UA 1.0 mg/dL 0.1 - 1.0 mg/dL  Bilirubin, UA Small (A) Negative  Blood, UA Large (A) Negative  RBC, UA >100 (H) 0 - 5 /HPF  WBC, UA 2 <=5 /HPF  Squam Epithel,  UA 1 0 - 10 /HPF  Bacteria, UA Few Few, Occasional, Small, None Seen /HPF  POCT Glucose  Collection Time: 12/23/22 5:34 PM  Result Value Ref Range  Glucose, POC 95 70 - 105 mg/dL  Bilirubin, total  Collection Time: 12/23/22 5:35 PM  Result Value Ref Range  Total Bilirubin 0.4 0.3 - 1.2 mg/dL  POCT Glucose  Collection Time: 12/23/22 7:50 PM  Result Value Ref Range  Glucose, POC 81 70 - 105 mg/dL  Basic Metabolic Panel  Collection Time: 12/24/22 5:22 AM  Result Value Ref Range  Sodium 137 135 - 145 mmol/L  Potassium 3.4 (L) 3.5 - 5.0 mmol/L  Chloride 104 98 - 107 mmol/L  CO2 18.3 (L) 21.0 - 32.0 mmol/L  Anion Gap 15 (H) 3 - 11 mmol/L  BUN 47 (H) 8 - 20 mg/dL  Creatinine 3.25 (H) 0.60 - 1.10 mg/dL  BUN/Creatinine Ratio 14  eGFR CKD-EPI (2021) Female 17 (L) >=60 mL/min/1.64m  Glucose 85 70 - 179 mg/dL  Calcium 8.3 (L) 8.5 - 10.1 mg/dL  CBC  Collection Time: 12/24/22 5:22 AM  Result Value Ref Range  WBC 14.7 (H) 4.0 - 10.5 10*9/L  RBC 4.01 3.80 - 5.10 10*12/L  HGB 7.9 (L) 11.5 - 15.0 g/dL  HCT 26.5 (L) 34.0 - 44.0 %  MCV 66.1 (L) 80.0 - 98.0 fL  MCH 19.7 (L) 27.0 - 34.0 pg  MCHC 29.8 (L) 32.0 - 36.0 g/dL  RDW 20.9 (H) 11.5 - 14.5 %  MPV 9.8 7.4 - 10.4 fL  Platelet 249 140 - 415 10*9/L  POCT Glucose  Collection Time: 12/24/22 7:34 AM  Result Value Ref Range  Glucose, POC 95 70 - 105 mg/dL   Review of Systems: As per HPI otherwise 10 point review of systems negative.   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 Her smoking use included cigarettes. She has a 15.00 pack-year smoking history. She has never used smokeless tobacco. She reports that she does not drink alcohol and does not use drugs.  Allergies  Allergen Reactions   Honey Bee Treatment [Bee Venom] Anaphylaxis and Itching   Baclofen     Headaches     Family History  Problem Relation Age of Onset   Multiple sclerosis Father    Diabetes Mother     Prior to Admission medications   Medication Sig Start Date End Date Taking? Authorizing Provider  atorvastatin (LIPITOR) 20 MG tablet  Take 20 mg by mouth daily.    [provider]  chlorthalidone (HYGROTON) 25 MG tablet Take 25 mg by mouth daily.    [provider]  gabapentin (NEURONTIN) 300 MG capsule take 2 capsules by mouth three times a day Patient taking differently: Take 600 mg by mouth 3 (three) times daily. 11/30/16   Meredith Staggers, MD  lisinopril (PRINIVIL,ZESTRIL) 40 MG tablet Take 1 tablet (40 mg total) by mouth daily. 12/06/12   Angiulli, Lavon Paganini, PA-C  meloxicam (MOBIC) 15 MG tablet Take 1 tablet (15 mg total) by mouth daily. Patient taking differently: Take 15 mg by mouth daily as needed for pain. 02/23/16   Meredith Staggers, MD  metFORMIN (GLUCOPHAGE) 500 MG tablet Take 500 mg by mouth daily as needed (sugar levels).     [provider]  norethindrone (MICRONOR) 0.35 MG tablet TAKE 1 TABLET(0.35 MG) BY MOUTH DAILY 11/13/22   Derrek Monaco A, NP  pantoprazole (PROTONIX) 40 MG tablet Take 1 tablet (40 mg total) by mouth daily. 09/19/13   Meredith Staggers, MD  tiZANidine (ZANAFLEX) 4 MG tablet TAKE 1.5 TABLETS BY MOUTH 4 TIMES DAILY. Patient taking differently: Take 6 mg by mouth 4 (four) times daily. 09/29/16   Meredith Staggers, MD    Physical Exam: Vitals:   12/24/22 1518  BP: (!) 159/96  Pulse: (!) 106  Resp: 20  TempSrc:  Oral    Constitutional: NAD, calm, comfortable Vitals:   12/24/22 1518  BP: (!) 159/96  Pulse: (!) 106  Resp: 20  TempSrc: Oral   Eyes: PERRL, lids and conjunctivae normal ENMT: Mucous membranes are moist. Normal dentition.  Neck: normal, supple, no masses, no thyromegaly Respiratory: clear to auscultation bilaterally, no wheezing, no crackles. Normal respiratory effort. No accessory muscle use.  Cardiovascular: Regular rate and rhythm, no murmurs / rubs / gallops. No extremity edema. 2+ pedal pulses. Abdomen: obese, +RUQ  tenderness, no masses palpated. No hepatosplenomegaly. Bowel sounds positive.  Musculoskeletal: no clubbing / cyanosis. No joint deformity upper and lower extremities. Good rom upper ext , 2/5 lower extremity , no contractures. Normal muscle tone.  Skin: no rashes, lesions, ulcers. No induration Neurologic: CN 2-12 grossly intact. Sensation intact, Strength 5/5 in all upper extremity.  2/5 in lower extremity ( paraplegic)  Psychiatric: Normal judgment and insight. Alert and oriented x 3. Normal mood.    Labs on Admission: I have personally reviewed following labs and imaging studies  CBC: No results for input(s): "WBC", "NEUTROABS", "HGB", "HCT", "MCV", "PLT" in the last 168 hours. Basic Metabolic Panel: No results for input(s): "NA", "K", "CL", "CO2", "GLUCOSE", "BUN", "CREATININE", "CALCIUM", "MG", "PHOS" in the last 168 hours. GFR: CrCl cannot be calculated (Patient's most recent lab result is older than the maximum 21 days allowed.). Liver Function Tests: No results for input(s): "AST", "ALT", "ALKPHOS", "BILITOT", "PROT", "ALBUMIN" in the last 168 hours. No results for input(s): "LIPASE", "AMYLASE" in the last 168 hours. No results for input(s): "AMMONIA" in the last 168 hours. Coagulation Profile: No results for input(s): "INR", "PROTIME" in the last 168 hours. Cardiac Enzymes: No results for input(s): "CKTOTAL", "CKMB", "CKMBINDEX", "TROPONINI" in the  last 168 hours. BNP (last 3 results) No results for input(s): "PROBNP" in the last 8760 hours. HbA1C: No results for input(s): "HGBA1C" in the last 72 hours. CBG: No results for input(s): "GLUCAP" in the last 168 hours. Lipid Profile: No results for input(s): "CHOL", "HDL", "LDLCALC", "TRIG", "CHOLHDL", "LDLDIRECT" in the last 72  hours. Thyroid Function Tests: No results for input(s): "TSH", "T4TOTAL", "FREET4", "T3FREE", "THYROIDAB" in the last 72 hours. Anemia Panel: No results for input(s): "VITAMINB12", "FOLATE", "FERRITIN", "TIBC", "IRON", "RETICCTPCT" in the last 72 hours. Urine analysis:    Component Value Date/Time   COLORURINE AMBER (A) 11/26/2013 1630   APPEARANCEUR HAZY (A) 11/26/2013 1630   LABSPEC >1.030 (H) 11/26/2013 1630   PHURINE 5.5 11/26/2013 1630   GLUCOSEU NEGATIVE 11/26/2013 1630   HGBUR NEGATIVE 11/26/2013 1630   BILIRUBINUR NEGATIVE 11/26/2013 1630   KETONESUR NEGATIVE 11/26/2013 1630   PROTEINUR TRACE (A) 11/26/2013 1630   UROBILINOGEN 1.0 11/26/2013 1630   NITRITE NEGATIVE 11/26/2013 1630   LEUKOCYTESUR NEGATIVE 11/26/2013 1630    Radiological Exams on Admission: No results found.  EKG: Independently reviewed.   Assessment/Plan  Acute Cholecystitis associated with Salmonella bacteremia  - continue on levaquin /metronidazole -surgery consult for further treatment  -repeat blood cultures pending  -infectious disease consult pending -echo pending   GI bleed nos -ct no colitis  -had brbpr per OSH with significant drop in h/h  -ppi/ bid  -no further episodes  -consider gi consult in am  Anemia of blood loss  -s/p PRBC 1/21 1 unit  -monitor h/h transfuse if 7 or less   Ecoli UTI  -continue on levaquin  -repeat ua pending   AKI  -progressive thought to be related to infection / use of toradol/ace -UA pending  -renal consult  -hold nephrotoxic medication  -ivfs  -strict I/o  -renal u/s    Hypokalemia -repeat labs pending   -replete prn   DMII -last A1c 2013  -check A1c -finger sticks q4h   Essential Hypertension  -lisinopril on hold due to aki and ? Gi bleed  - monitor , prn hydral    DVT prophylaxis: scd Code Status: full/ as discussed per patient wishes in event of cardiac arrest Family Communication: none at bedside Disposition Plan: patient  expected to be admitted greater than 2 midnights  Consults called:  Surgery- Dr Donne Hazel , ID DR Alphonsa Gin,  please call nephrology in am Dr Johnney Ou Admission status: progressive care    Clance Boll MD [facility]   If 7PM-7AM, please contact night-coverage [email or web address] Password [facility]  12/24/2022, 3:36 PM

## 2022-12-24 NOTE — Progress Notes (Signed)
Spoke with radiology regarding portable chest xray, will be up as soon as possible.

## 2022-12-25 ENCOUNTER — Other Ambulatory Visit: Payer: Self-pay | Admitting: Physician Assistant

## 2022-12-25 ENCOUNTER — Inpatient Hospital Stay (HOSPITAL_COMMUNITY): Payer: Medicare HMO

## 2022-12-25 DIAGNOSIS — R7881 Bacteremia: Secondary | ICD-10-CM

## 2022-12-25 DIAGNOSIS — K810 Acute cholecystitis: Secondary | ICD-10-CM

## 2022-12-25 HISTORY — PX: IR PERC CHOLECYSTOSTOMY: IMG2326

## 2022-12-25 LAB — ECHOCARDIOGRAM COMPLETE
Area-P 1/2: 6.37 cm2
Calc EF: 61.7 %
Height: 68.5 in
S' Lateral: 3.3 cm
Single Plane A2C EF: 64.2 %
Single Plane A4C EF: 60 %
Weight: 4042.35 oz

## 2022-12-25 LAB — COMPREHENSIVE METABOLIC PANEL
ALT: 41 U/L (ref 0–44)
ALT: 36 U/L (ref 0–44)
AST: 14 U/L — ABNORMAL LOW (ref 15–41)
AST: 12 U/L — ABNORMAL LOW (ref 15–41)
Albumin: 2.4 g/dL — ABNORMAL LOW (ref 3.5–5.0)
Albumin: 2.3 g/dL — ABNORMAL LOW (ref 3.5–5.0)
Alkaline Phosphatase: 148 U/L — ABNORMAL HIGH (ref 38–126)
Alkaline Phosphatase: 142 U/L — ABNORMAL HIGH (ref 38–126)
Anion gap: 9 (ref 5–15)
Anion gap: 7 (ref 5–15)
BUN: 52 mg/dL — ABNORMAL HIGH (ref 6–20)
BUN: 50 mg/dL — ABNORMAL HIGH (ref 6–20)
CO2: 17 mmol/L — ABNORMAL LOW (ref 22–32)
CO2: 20 mmol/L — ABNORMAL LOW (ref 22–32)
Calcium: 8 mg/dL — ABNORMAL LOW (ref 8.9–10.3)
Calcium: 8.3 mg/dL — ABNORMAL LOW (ref 8.9–10.3)
Chloride: 109 mmol/L (ref 98–111)
Chloride: 110 mmol/L (ref 98–111)
Creatinine, Ser: 3.78 mg/dL — ABNORMAL HIGH (ref 0.44–1.00)
Creatinine, Ser: 3.48 mg/dL — ABNORMAL HIGH (ref 0.44–1.00)
GFR, Estimated: 14 mL/min — ABNORMAL LOW (ref >60)
GFR, Estimated: 16 mL/min — ABNORMAL LOW (ref >60)
Glucose, Bld: 99 mg/dL (ref 70–99)
Glucose, Bld: 110 mg/dL — ABNORMAL HIGH (ref 70–99)
Potassium: 3.3 mmol/L — ABNORMAL LOW (ref 3.5–5.1)
Potassium: 3.2 mmol/L — ABNORMAL LOW (ref 3.5–5.1)
Sodium: 135 mmol/L (ref 135–145)
Sodium: 137 mmol/L (ref 135–145)
Total Bilirubin: 0.4 mg/dL (ref 0.3–1.2)
Total Bilirubin: 0.3 mg/dL (ref 0.3–1.2)
Total Protein: 6 g/dL — ABNORMAL LOW (ref 6.5–8.1)
Total Protein: 5.8 g/dL — ABNORMAL LOW (ref 6.5–8.1)

## 2022-12-25 LAB — PROTIME-INR
INR: 1.3 — ABNORMAL HIGH (ref 0.8–1.2)
Prothrombin Time: 15.6 seconds — ABNORMAL HIGH (ref 11.4–15.2)

## 2022-12-25 LAB — PROCALCITONIN: Procalcitonin: 2.37 ng/mL

## 2022-12-25 LAB — URINALYSIS, ROUTINE W REFLEX MICROSCOPIC
Bilirubin Urine: NEGATIVE
Glucose, UA: NEGATIVE mg/dL
Ketones, ur: NEGATIVE mg/dL
Nitrite: NEGATIVE
Protein, ur: NEGATIVE mg/dL
Specific Gravity, Urine: 1.01 (ref 1.005–1.030)
pH: 5 (ref 5.0–8.0)

## 2022-12-25 LAB — CBC
HCT: 23.5 % — ABNORMAL LOW (ref 36.0–46.0)
Hemoglobin: 7 g/dL — ABNORMAL LOW (ref 12.0–15.0)
MCH: 19.6 pg — ABNORMAL LOW (ref 26.0–34.0)
MCHC: 29.8 g/dL — ABNORMAL LOW (ref 30.0–36.0)
MCV: 65.8 fL — ABNORMAL LOW (ref 80.0–100.0)
Platelets: 270 10*3/uL (ref 150–400)
RBC: 3.57 MIL/uL — ABNORMAL LOW (ref 3.87–5.11)
RDW: 21.6 % — ABNORMAL HIGH (ref 11.5–15.5)
WBC: 15 10*3/uL — ABNORMAL HIGH (ref 4.0–10.5)
nRBC: 0 % (ref 0.0–0.2)

## 2022-12-25 LAB — HEMOGLOBIN: Hemoglobin: 7 g/dL — ABNORMAL LOW (ref 12.0–15.0)

## 2022-12-25 LAB — HEMOGLOBIN A1C
Hgb A1c MFr Bld: 6 % — ABNORMAL HIGH (ref 4.8–5.6)
Mean Plasma Glucose: 125.5 mg/dL

## 2022-12-25 LAB — APTT: aPTT: 42 seconds — ABNORMAL HIGH (ref 24–36)

## 2022-12-25 LAB — HIV ANTIBODY (ROUTINE TESTING W REFLEX): HIV Screen 4th Generation wRfx: NONREACTIVE

## 2022-12-25 LAB — GLUCOSE, CAPILLARY: Glucose-Capillary: 112 mg/dL — ABNORMAL HIGH (ref 70–99)

## 2022-12-25 LAB — SODIUM, URINE, RANDOM: Sodium, Ur: 60 mmol/L

## 2022-12-25 LAB — CREATININE, URINE, RANDOM: Creatinine, Urine: 48 mg/dL

## 2022-12-25 LAB — PREPARE RBC (CROSSMATCH)

## 2022-12-25 MED ORDER — TIZANIDINE HCL 4 MG PO TABS
4.0000 mg | ORAL_TABLET | Freq: Two times a day (BID) | ORAL | Status: DC
  Administered 2022-12-25 – 2022-12-31 (×12): 4 mg via ORAL
  Filled 2022-12-25 (×12): qty 1

## 2022-12-25 MED ORDER — ACETAMINOPHEN 10 MG/ML IV SOLN
1000.0000 mg | Freq: Once | INTRAVENOUS | Status: AC
  Administered 2022-12-25: 1000 mg via INTRAVENOUS
  Filled 2022-12-25: qty 100

## 2022-12-25 MED ORDER — CEFAZOLIN SODIUM-DEXTROSE 2-4 GM/100ML-% IV SOLN
INTRAVENOUS | Status: AC
  Filled 2022-12-25: qty 100

## 2022-12-25 MED ORDER — FENTANYL CITRATE (PF) 100 MCG/2ML IJ SOLN
INTRAMUSCULAR | Status: AC
  Filled 2022-12-25: qty 2

## 2022-12-25 MED ORDER — TIZANIDINE HCL 4 MG PO TABS: 4.0000 mg | ORAL_TABLET | Freq: Four times a day (QID) | ORAL | Status: DC

## 2022-12-25 MED ORDER — ATORVASTATIN CALCIUM 20 MG PO TABS
20.0000 mg | ORAL_TABLET | Freq: Every day | ORAL | Status: DC
  Administered 2022-12-25 – 2022-12-31 (×7): 20 mg via ORAL
  Filled 2022-12-25 (×7): qty 1

## 2022-12-25 MED ORDER — METRONIDAZOLE 500 MG/100ML IV SOLN
500.0000 mg | Freq: Two times a day (BID) | INTRAVENOUS | Status: DC
  Administered 2022-12-25 – 2022-12-27 (×4): 500 mg via INTRAVENOUS
  Filled 2022-12-25 (×4): qty 100

## 2022-12-25 MED ORDER — DIPHENHYDRAMINE HCL 50 MG/ML IJ SOLN
25.0000 mg | Freq: Once | INTRAMUSCULAR | Status: AC
  Administered 2022-12-25: 25 mg via INTRAVENOUS
  Filled 2022-12-25: qty 1

## 2022-12-25 MED ORDER — MIDAZOLAM HCL 2 MG/2ML IJ SOLN
INTRAMUSCULAR | Status: AC | PRN
  Administered 2022-12-25 (×2): 1 mg via INTRAVENOUS

## 2022-12-25 MED ORDER — SODIUM CHLORIDE 0.9% FLUSH
5.0000 mL | Freq: Three times a day (TID) | INTRAVENOUS | Status: DC
  Administered 2022-12-25 – 2022-12-31 (×17): 5 mL

## 2022-12-25 MED ORDER — METOPROLOL TARTRATE 25 MG PO TABS
12.5000 mg | ORAL_TABLET | Freq: Two times a day (BID) | ORAL | Status: DC
  Administered 2022-12-25 – 2022-12-31 (×13): 12.5 mg via ORAL
  Filled 2022-12-25 (×13): qty 1

## 2022-12-25 MED ORDER — LEVOFLOXACIN IN D5W 750 MG/150ML IV SOLN
750.0000 mg | INTRAVENOUS | Status: DC
  Administered 2022-12-25: 750 mg via INTRAVENOUS
  Filled 2022-12-25: qty 150

## 2022-12-25 MED ORDER — SODIUM CHLORIDE 0.9% IV SOLUTION: Freq: Once | INTRAVENOUS | Status: AC

## 2022-12-25 MED ORDER — FENTANYL CITRATE (PF) 100 MCG/2ML IJ SOLN
INTRAMUSCULAR | Status: AC | PRN
  Administered 2022-12-25 (×2): 50 ug via INTRAVENOUS

## 2022-12-25 MED ORDER — MIDAZOLAM HCL 2 MG/2ML IJ SOLN
INTRAMUSCULAR | Status: AC
  Filled 2022-12-25: qty 2

## 2022-12-25 MED ORDER — PANTOPRAZOLE SODIUM 40 MG PO TBEC
40.0000 mg | DELAYED_RELEASE_TABLET | Freq: Every day | ORAL | Status: DC
  Administered 2022-12-26 – 2022-12-31 (×6): 40 mg via ORAL
  Filled 2022-12-25 (×6): qty 1

## 2022-12-25 MED ORDER — LIDOCAINE HCL 1 % IJ SOLN
INTRAMUSCULAR | Status: AC
  Administered 2022-12-25: 10 mL via INTRADERMAL
  Filled 2022-12-25: qty 20

## 2022-12-25 MED ORDER — CEFAZOLIN SODIUM-DEXTROSE 2-4 GM/100ML-% IV SOLN
INTRAVENOUS | Status: AC | PRN
  Administered 2022-12-25: 2 g via INTRAVENOUS

## 2022-12-25 MED ORDER — IOHEXOL 300 MG/ML  SOLN
50.0000 mL | Freq: Once | INTRAMUSCULAR | Status: AC | PRN
  Administered 2022-12-25: 15 mL

## 2022-12-25 NOTE — Consult Note (Signed)
Chief Complaint: Cholecystisits  Referring Physician(s): Clance Boll, MD   Supervising Physician: Michaelle Birks  Patient Status: [facility] - In-pt  History of Present Illness: Debbie [name] is a 47 y.o. 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  She who presented to [facility] with 3 days of nausea, vomiting, loose stool, fatigue, and abdominal pain.  She was admited on 12/20/22 with diagnoses of Eoli UTI and bacteremia.  RUQ u/s showed acute cholecystitis.    She also had an episode of rectal bleeding which dropped her hemoglobin from 9.2 on admission to 6.8.   Given her current renal failure, GI Bleed and Salmonella bacteremia, she was deemed not a candidate for cholecystectomy.  We are asked to perform a percutaneous cholecystostomy.  She is currently afebrile. She continues to have abdominal pain. N/V has subsided. No SOB. Otherwise ROS negative.  Past Medical History:  Diagnosis Date   Chronic myofascial pain    Chronic pain    Fibroid 10/20/2021   Hypertension    Mass    T5-T6 intradural mass   Neurogenic bladder    and bowel   Spastic paraplegia     Past Surgical History:  Procedure Laterality Date   CESAREAN SECTION     FRACTURE SURGERY     LAMINECTOMY  11/11/2012   Procedure: THORACIC LAMINECTOMY FOR TUMOR;  Surgeon: Charlie Pitter, MD;  Location: [place] [facility];  Service: Neurosurgery;  Laterality: N/A;  thracic laminectomy for intradural and intramedullary neoplasm   SPINE SURGERY      Allergies: Honey bee treatment [bee venom] and Baclofen  Medications: Prior to Admission medications   Medication Sig Start Date End Date Taking? Authorizing Provider  atorvastatin (LIPITOR) 20 MG tablet Take 20 mg by mouth daily.   Yes [provider]  chlorthalidone (HYGROTON) 25 MG tablet Take 25 mg by mouth daily.   Yes [provider]   gabapentin (NEURONTIN) 300 MG capsule take 2 capsules by mouth three times a day Patient taking differently: Take 600 mg by mouth 3 (three) times daily. 11/30/16  Yes Meredith Staggers, MD  insulin glargine (LANTUS SOLOSTAR) 100 UNIT/ML Solostar Pen Inject 24 Units into the skin daily. 04/24/22  Yes [provider]  lisinopril (PRINIVIL,ZESTRIL) 40 MG tablet Take 1 tablet (40 mg total) by mouth daily. 12/06/12  Yes Angiulli, Lavon Paganini, PA-C  meloxicam (MOBIC) 15 MG tablet Take 1 tablet (15 mg total) by mouth daily. Patient taking differently: Take 15 mg by mouth daily as needed for pain. 02/23/16  Yes Meredith Staggers, MD  metFORMIN (GLUCOPHAGE) 1000 MG tablet Take 1,000 mg by mouth 2 (two) times daily with a meal.   Yes [provider]  pantoprazole (PROTONIX) 40 MG tablet Take 1 tablet (40 mg total) by mouth daily. 09/19/13  Yes Meredith Staggers, MD  tiZANidine (ZANAFLEX) 4 MG tablet TAKE 1.5 TABLETS BY MOUTH 4 TIMES DAILY. Patient taking differently: Take 4 mg by mouth 4 (four) times daily. 09/29/16  Yes Meredith Staggers, MD  norethindrone (MICRONOR) 0.35 MG tablet TAKE 1 TABLET(0.35 MG) BY MOUTH DAILY Patient not taking: Reported on 12/24/2022 11/13/22   Estill Dooms, NP     Family History  Problem Relation Age of Onset   Multiple sclerosis Father    Diabetes Mother     Social History   Socioeconomic History   Marital status: Legally Separated  Spouse name: Not on file   Number of children: 5   Years of education: Some college   Highest education level: Not on file  Occupational History   Not on file  Tobacco Use   Smoking status: Former    Packs/day: 1.50    Years: 10.00    Total pack years: 15.00    Types: Cigarettes    Quit date: 11/07/2012    Years since quitting: 10.1   Smokeless tobacco: Never   Tobacco comments:    quit 5 yrs ago  Vaping Use   Vaping Use: Never used  Substance and Sexual Activity   Alcohol use: No   Drug use: No    Sexual activity: Yes    Birth control/protection: None  Other Topics Concern   Not on file  Social History Narrative   Lives with husband and children   Caffeine use: soda daily   Social Determinants of Health   Financial Resource Strain: Low Risk  (10/13/2021)   Overall Financial Resource Strain (CARDIA)    Difficulty of Paying Living Expenses: Not very hard  Food Insecurity: No Food Insecurity (12/24/2022)   Hunger Vital Sign    Worried About Running Out of Food in the Last Year: Never true    Ran Out of Food in the Last Year: Never true  Transportation Needs: No Transportation Needs (12/24/2022)   PRAPARE - Hydrologist (Medical): No    Lack of Transportation (Non-Medical): No  Physical Activity: Inactive (10/13/2021)   Exercise Vital Sign    Days of Exercise per Week: 0 days    Minutes of Exercise per Session: 0 min  Stress: Stress Concern Present (10/13/2021)   [place]    Feeling of Stress : To some extent  Social Connections: Socially Integrated (10/13/2021)   Social Connection and Isolation Panel [NHANES]    Frequency of Communication with Friends and Family: More than three times a week    Frequency of Social Gatherings with Friends and Family: More than three times a week    Attends Religious Services: More than 4 times per year    Active Member of [employer] or Organizations: Yes    Attends Music therapist: More than 4 times per year    Marital Status: Married     Review of Systems: A 12 point ROS discussed and pertinent positives are indicated in the HPI above.  All other systems are negative.  Review of Systems  Vital Signs: BP (!) 150/85 (BP Location: Left Arm)   Pulse 95   Temp 98.4 F (36.9 C) (Oral)   Resp 18   Ht 5' 8.5" (1.74 m)   Wt 252 lb 10.4 oz (114.6 kg)   SpO2 98%   BMI 37.86 kg/m   Physical Exam Vitals reviewed.  Constitutional:       Appearance: She is obese.  HENT:     Head: Normocephalic and atraumatic.  Eyes:     Extraocular Movements: Extraocular movements intact.  Cardiovascular:     Rate and Rhythm: Normal rate and regular rhythm.  Pulmonary:     Effort: Pulmonary effort is normal. No respiratory distress.     Breath sounds: Normal breath sounds.  Abdominal:     Comments: RUQ tenderness.  Musculoskeletal:     Cervical back: Normal range of motion.     Comments: Paraplegic   Skin:    General: Skin is warm and dry.  Neurological:     General: No focal deficit present.     Mental Status: She is alert and oriented to person, place, and time.  Psychiatric:        Mood and Affect: Mood normal.        Behavior: Behavior normal.        Thought Content: Thought content normal.        Judgment: Judgment normal.     Imaging: US Abdomen Limited RUQ (LIVER/GB)  Result Date: 12/24/2022 CLINICAL DATA:  Evaluate for acute cholecystitis. EXAM: ULTRASOUND ABDOMEN LIMITED RIGHT UPPER QUADRANT COMPARISON:  None Available. FINDINGS: Gallbladder: Echogenic gallstones and a mild-to-moderate amount of layering echogenic material are again seen within the dependent portion of the gallbladder lumen. The gallbladder wall measures 5.1 mm in thickness. A 3.3 cm x 1.9 cm x 2.2 cm anechoic area is seen adjacent to the gallbladder. Pericholecystic fluid is again seen. No sonographic Murphy sign noted by sonographer. Common bile duct: Diameter: 6.0 mm Liver: No focal lesion identified. Within normal limits in parenchymal echogenicity. Portal vein is patent on color Doppler imaging with normal direction of blood flow towards the liver. Other: None. IMPRESSION: 1. Cholelithiasis and gallbladder sludge with additional findings concerning for acute cholecystitis. 2. Cystic appearing area adjacent to the gallbladder which may represent sequelae associated with gallbladder perforation. Electronically Signed   By: Virgina Norfolk M.D.   On:  12/24/2022 21:51   US RENAL  Result Date: 12/24/2022 CLINICAL DATA:  Acute kidney injury. EXAM: RENAL / URINARY TRACT ULTRASOUND COMPLETE COMPARISON:  None Available. FINDINGS: Right Kidney: Renal measurements: 14.2 cm x 6.9 cm x 7.5 cm = volume: 400.3 mL. Echogenicity within normal limits. No mass is visualized. Mild right-sided hydronephrosis is noted. A trace amount of perinephric fluid is also seen. Left Kidney: Renal measurements: 14.2 cm x 6.4 cm x 7.0 cm = volume: 331.2 mL. Echogenicity within normal limits. No mass or hydronephrosis visualized. Bladder: Appears normal for degree of bladder distention. The ureteral jets are not well visualized, as per the ultrasound technologist. Other: None. IMPRESSION: 1. Mild right-sided hydronephrosis with a trace amount of right-sided perinephric fluid. Electronically Signed   By: Virgina Norfolk M.D.   On: 12/24/2022 21:38   Portable chest 1 View  Result Date: 12/24/2022 CLINICAL DATA:  PICC line placement EXAM: PORTABLE CHEST 1 VIEW COMPARISON:  Portable exam 1812 hours compared to 11/30/2013 FINDINGS: Tip of RIGHT arm PICC line projects over SVC. Borderline enlargement of cardiac silhouette. Mediastinal contours and pulmonary vascularity normal. Minimal subsegmental atelectasis at LEFT base. Remaining lungs clear. No pleural effusion or pneumothorax. IMPRESSION: Tip of RIGHT arm PICC line projects over SVC. Minimal LEFT basilar subsegmental atelectasis. Electronically Signed   By: Lavonia Dana M.D.   On: 12/24/2022 21:25    Labs:  CBC: Recent Labs    12/24/22 2302 12/25/22 0352  WBC 15.6* 15.0*  HGB 7.4* 7.0*  7.0*  HCT 24.8* 23.5*  PLT 281 270    COAGS: Recent Labs    12/24/22 2302 12/25/22 0352  INR 1.2 1.3*  APTT  --  42*    BMP: Recent Labs    12/24/22 2302 12/25/22 0352  NA 135 137  K 3.3* 3.2*  CL 109 110  CO2 17* 20*  GLUCOSE 99 110*  BUN 52* 50*  CALCIUM 8.0* 8.3*  CREATININE 3.78* 3.48*  GFRNONAA 14* 16*     LIVER FUNCTION TESTS: Recent Labs    12/24/22 2302 12/25/22 0352  BILITOT 0.4  0.3  AST 14* 12*  ALT 41 36  ALKPHOS 148* 142*  PROT 6.0* 5.8*  ALBUMIN 2.4* 2.3*    TUMOR MARKERS: No results for input(s): "AFPTM", "CEA", "CA199", "CHROMGRNA" in the last 8760 hours.  Assessment and Plan:  Acute cholecystitis. Not a candidate for cholecystectomy at this time.  Will proceed with image guided placement of a percutaneous cholecystostomy today by Dr. Maryelizabeth Kaufmann.  Risks and benefits discussed with the patient including, but not limited to bleeding, infection, gallbladder perforation, bile leak, sepsis or even death.  All of the patient's questions were answered, patient is agreeable to proceed. Consent signed and in chart.  Percutaneous cholecystostomy drain to remain in place at least 6 weeks.   Recommend fluoroscopy with injection of the drain in IR to evaluate for patency of the cystic duct.  If the duct is patent and general surgery feels patient is stable for cholecystectomy, the drain would be removed at time of surgery.  If the duct is patent and general surgery feels patient is NEVER a candidate for cholecystectomy, drain can be capped for a trial.  If symptoms recur, then place to gravity bag again.  If trial is successful, discuss possible removal of the drain.  If trial in unsuccessful, then patient will need routine exchanges of the  chole tube about every 8-10 weeks.  Please call the IR PA at [phone] when patient is about to be discharged and we will arrange the follow up drain injection (ok to leave message).   Thank you for allowing our service to participate in DANELLE CURIALE 's care.  Electronically Signed: Murrell Redden, PA-C   12/25/2022, 10:48 AM      I spent a total of 40 Minutes  in face to face in clinical consultation, greater than 50% of which was counseling/coordinating care for perc chole.

## 2022-12-25 NOTE — Progress Notes (Signed)
Echocardiogram 2D Echocardiogram has been performed.  Debbie [name] 12/25/2022, 1:33 PM

## 2022-12-25 NOTE — Consult Note (Signed)
[facility]    Date of Admission:  12/24/2022   Total days of inpatient antibiotics 0        Reason for Consult: Salmonella bactermia    Principal Problem:   Acute cholecystitis Active Problems:   Pressure injury of skin   Intractable pain   Assessment: 47 year old female transferred from [facility] with acute cholecystitis worsening renal failure and Salmonella bacteremia.  ID engaged for management of bacteremia #Salmonella bacteremia #E. 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  No abdominal pain.  Found to have acute cholecystitis on imaging.  Hospital course was complicated by E. coli UTI and Salmonella bacteremia.  She was initiated on ceftriaxone(ecoli sens) and metronidazole and transitioned to Levaquin given the somewhat bacteremia. - Right upper quadrant ultrasound repeated: Showed cholelithiasis and gallbladder sludge with additional findings concerning for acute cholecystitis.  Cystic appearing area adjacent to gallbladder may represent sequela of gallbladder perforation. - General surgery has been engaged pending  - Radiology following and patient underwent cholecystotomy tube placement for perforated cholecystitis, cultures pending on 1/22 Recommendations:  -Called and spoke with [facility] per salmonella speciation and sens: Salmonella sent to state lab to identify and [facility] for sens (1/19) -Continue levaquin and metronidazole -Follow blood Cx, drain cultures -Follow Surgical plan   I have personally spent 115 minutes involved in face-to-face and non-face-to-face activities for this patient on the day of the visit. Professional time spent includes the following activities: Preparing to see the patient (review of tests), Obtaining and/or  reviewing separately obtained history (admission/discharge record), Performing a medically appropriate examination and/or evaluation , Ordering medications/tests/procedures, referring and communicating with other health care professionals, Documenting clinical information in the EMR, Independently interpreting results (not separately reported), Communicating results to the patient/family/caregiver, Counseling and educating the patient/family/caregiver and Care coordination (not separately reported).   Microbiology:   Antibiotics: Levaquin and metronidazole  Cultures: Blood 1/17 blood Cx+ salmonella sp( S   HPI: Debbie [name] is a 47 y.o. 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 Pt presented to OSH with 3 day Hx of N/V/loos stool and fatigue. She was initially found to have Ecoli UTI , AKI, RUQ U/S showed acute cholecystitis. Pt was place don ceftiraxone and metronidazole. Blood Cx returned positive for salmonella species and ctx switched to metronidazole. Transferred to [facility] due to need for cholecystectomy, worsening renal failure, GIB, salmonella bacteremia. ID engaged for antiiboics recommendations.    Review of Systems: ROS  Past Medical History:  Diagnosis Date   Chronic myofascial pain    Chronic pain    Fibroid 10/20/2021   Hypertension    Mass    T5-T6 intradural mass   Neurogenic bladder    and bowel   Spastic paraplegia     Social History   Tobacco Use   Smoking status: Former    Packs/day: 1.50    Years: 10.00    Total pack years: 15.00    Types: Cigarettes    Quit date: 11/07/2012    Years since quitting: 10.1   Smokeless tobacco: Never   Tobacco comments:    quit 5 yrs ago  Vaping Use   Vaping  Use: Never used  Substance Use Topics   Alcohol use: No   Drug use: No    Family History  Problem Relation  Age of Onset   Multiple sclerosis Father    Diabetes Mother    Scheduled Meds:  Chlorhexidine Gluconate Cloth  6 each Topical Daily   pantoprazole (PROTONIX) IV  40 mg Intravenous Q12H   Continuous Infusions:  dextrose 5 % and 0.2 % NaCl with KCl 20 mEq 100 mL/hr at 12/24/22 2236   PRN Meds:.albuterol, fentaNYL (SUBLIMAZE) injection, HYDROcodone-acetaminophen, ondansetron **OR** ondansetron (ZOFRAN) IV, sodium chloride flush Allergies  Allergen Reactions   Honey Bee Treatment [Bee Venom] Anaphylaxis and Itching   Baclofen     Headaches     OBJECTIVE: Blood pressure (!) 152/76, pulse (!) 102, temperature 99.1 F (37.3 C), temperature source Oral, resp. rate 20, weight 114.6 kg, SpO2 96 %.  Physical Exam  Lab Results Lab Results  Component Value Date   WBC 15.0 (H) 12/25/2022   HGB 7.0 (L) 12/25/2022   HGB 7.0 (L) 12/25/2022   HCT 23.5 (L) 12/25/2022   MCV 65.8 (L) 12/25/2022   PLT 270 12/25/2022    Lab Results  Component Value Date   CREATININE 3.48 (H) 12/25/2022   BUN 50 (H) 12/25/2022   NA 137 12/25/2022   K 3.2 (L) 12/25/2022   CL 110 12/25/2022   CO2 20 (L) 12/25/2022    Lab Results  Component Value Date   ALT 36 12/25/2022   AST 12 (L) 12/25/2022   ALKPHOS 142 (H) 12/25/2022   BILITOT 0.3 12/25/2022       Laurice Record, [place] 12/25/2022, 8:45 AM

## 2022-12-25 NOTE — Procedures (Signed)
Vascular and Interventional Radiology Procedure Note  Patient: Debbie [name] DOB: 09-Nov-1976 Medical Record Number: [id number] Note Date/Time: 12/25/22 1:50 PM   Performing Physician: Michaelle Birks, MD Assistant(s): None  Diagnosis: Perforated cholecystitis  Procedure:  CHOLECYSTOSTOMY TUBE PLACEMENT  Anesthesia: Conscious Sedation Complications: None Estimated Blood Loss: Minimal Specimens:  None  Findings:  Successful placement of 36F cholecystostomy tube.  Plan: Flush tube w 10 mL sterile NS and record drain output qShift. Follow up for routine tube evaluation in 6-8 week(s).   See detailed procedure note with images in PACS. The patient tolerated the procedure well without incident or complication and was returned to Floor Bed in stable condition.    Michaelle Birks, MD [facility]   Pager. [place]

## 2022-12-25 NOTE — Consult Note (Signed)
Consult Note  Debbie [name] 07-11-1976  [id number].    Requesting MD: Dr. Verlon Au Chief Complaint/Reason for Consult: Acute cholecystitis  HPI:  47 y.o. female with medical history significant for MS, T2DM, HTN, spine tumor s/p resection, paraplegia who presented to [facility] with N/V/diarrhea, abdominal pain, and fatigue and was admitted 12/20/22 for e coli UTI and AKI. During OSH admission was found to have +BCX for salmonella and US showing acute cholecystitis. She also developed rectal bleeding with hgb dropping to 6.8 (from 9.2) now s/p 1 U prbc. She was placed on antibiotics and transferred to [facility] for surgical management of acute cholecystitis.  Today she states she continues to have pain in epigastrium and RUQ. She has had intermittent pain like this after eating in the past - not as severe and improved with time. She has mild nausea this morning and has been NPO since yesterday. Before that she was tolerating liquids without worsening pain. She has had BM since admission and denies further bleeding.   Husband is at bedside  Substance use: none Allergies: bactrim Blood thinners: none Past Surgeries: laparoscopic appendectomy  ROS: ROS reviewed and negative except as above  Family History  Problem Relation Age of Onset   Multiple sclerosis Father    Diabetes Mother     Past Medical History:  Diagnosis Date   Chronic myofascial pain    Chronic pain    Fibroid 10/20/2021   Hypertension    Mass    T5-T6 intradural mass   Neurogenic bladder    and bowel   Spastic paraplegia     Past Surgical History:  Procedure Laterality Date   CESAREAN SECTION     FRACTURE SURGERY     LAMINECTOMY  11/11/2012   Procedure: THORACIC LAMINECTOMY FOR TUMOR;  Surgeon: Charlie Pitter, MD;  Location: [facility] OR[facility];  Service: Neurosurgery;  Laterality: N/A;  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 Her smoking use included cigarettes. She has a 15.00 pack-year smoking history. She has never used smokeless tobacco. She reports that she does not drink alcohol and does not use drugs.  Allergies:  Allergies  Allergen Reactions   Honey Bee Treatment [Bee Venom] Anaphylaxis and Itching   Baclofen     Headaches     Facility-Administered Medications Prior to Admission  Medication Dose Route Frequency Provider Last Rate Last Admin   incobotulinumtoxinA (XEOMIN) 100 units injection 100 Units  100 Units Intramuscular Q90 days Marcial Pacas, MD   100 Units at 08/22/17 1550   Medications Prior to Admission  Medication Sig Dispense Refill   atorvastatin (LIPITOR) 20 MG tablet Take 20 mg by mouth daily.     chlorthalidone (HYGROTON) 25 MG tablet Take 25 mg by mouth daily.     gabapentin (NEURONTIN) 300 MG capsule take 2 capsules by mouth three times a day (Patient taking differently: Take 600 mg by mouth 3 (three) times daily.) 180 capsule 1   insulin glargine (LANTUS SOLOSTAR) 100 UNIT/ML Solostar Pen Inject 24 Units into the skin daily.     lisinopril (PRINIVIL,ZESTRIL) 40 MG tablet Take 1 tablet (40 mg total) by mouth daily. 30 tablet 1   meloxicam (MOBIC) 15 MG tablet Take 1 tablet (15 mg total) by mouth daily. (Patient taking differently: Take 15 mg by mouth daily as needed for pain.) 30 tablet 4  metFORMIN (GLUCOPHAGE) 1000 MG tablet Take 1,000 mg by mouth 2 (two) times daily with a meal.     pantoprazole (PROTONIX) 40 MG tablet Take 1 tablet (40 mg total) by mouth daily. 30 tablet 5   tiZANidine (ZANAFLEX) 4 MG tablet TAKE 1.5 TABLETS BY MOUTH 4 TIMES DAILY. (Patient taking differently: Take 4 mg by mouth 4 (four) times daily.) 180 tablet 5   norethindrone (MICRONOR) 0.35 MG tablet TAKE 1 TABLET(0.35 MG) BY MOUTH DAILY (Patient not taking: Reported on 12/24/2022) 28 tablet 11    Blood pressure (!) 152/76, pulse (!) 102, temperature 99.1 F (37.3 C),  temperature source Oral, resp. rate 20, weight 114.6 kg, SpO2 96 %. Physical Exam: General: pleasant, WD, female who is laying in bed in NAD HEENT: head is normocephalic, atraumatic.  Sclera are noninjected.  Pupils equal and round. EOMs intact.  Ears and nose without any masses or lesions.  Mouth is pink and moist Heart: regular, rate, and rhythm.  Normal s1,s2. No obvious murmurs, gallops, or rubs noted.  Palpable radial and pedal pulses bilaterally Lungs: CTAB, no wheezes, rhonchi, or rales noted.  Respiratory effort nonlabored Abd: soft, ND, +BS, no masses, hernias, or organomegaly. Mild to moderate TTP in epigastrium and RUQ without rebound or guarding. Well healed lap incision scars MSK: all 4 extremities are symmetrical with no cyanosis, clubbing, or edema. Skin: warm and dry  Psych: A&Ox3 with an appropriate affect.    Results for orders placed or performed during the hospital encounter of 12/24/22 (from the past 48 hour(s))  Hemoglobin A1c     Status: Abnormal   Collection Time: 12/24/22  7:24 PM  Result Value Ref Range   Hgb A1c MFr Bld 6.0 (H) 4.8 - 5.6 %    Comment: (NOTE) Pre diabetes:          5.7%-6.4%  Diabetes:              >6.4%  Glycemic control for   <7.0% adults with diabetes    Mean Plasma Glucose 125.5 mg/dL    Comment: Performed at [facility], [place] [address] [place], [place] [address]  TSH     Status: None   Collection Time: 12/24/22  7:24 PM  Result Value Ref Range   TSH 1.101 0.350 - 4.500 uIU/mL    Comment: Performed by a 3rd Generation assay with a functional sensitivity of <=0.01 uIU/mL. Performed at [facility], [place] [address] [place], [place] [address]   Lactic acid, plasma     Status: None   Collection Time: 12/24/22  7:24 PM  Result Value Ref Range   Lactic Acid, Venous 0.9 0.5 - 1.9 mmol/L    Comment: Performed at [facility], [place] [address], [place], [place] [address]  Sedimentation rate      Status: Abnormal   Collection Time: 12/24/22  8:00 PM  Result Value Ref Range   Sed Rate 90 (H) 0 - 22 mm/hr    Comment: Performed at [facility], [place] [address], [place], [place] [address]  HIV Antibody (routine testing w rflx)     Status: None   Collection Time: 12/24/22  8:27 PM  Result Value Ref Range   HIV Screen 4th Generation wRfx Non Reactive Non Reactive    Comment: Performed at [facility], [place] [address], [place], [place] [address]  CBC with Differential/Platelet     Status: Abnormal   Collection Time: 12/24/22 11:02 PM  Result Value Ref Range   WBC 15.6 (H) 4.0 - 10.5 K/uL   RBC 3.78 (L) 3.87 - 5.11 MIL/uL   Hemoglobin 7.4 (L) 12.0 - 15.0 g/dL    Comment: Reticulocyte Hemoglobin testing may be clinically indicated, consider ordering this additional test [id number]    HCT 24.8 (L) 36.0 - 46.0 %   MCV 65.6 (L) 80.0 - 100.0 fL   MCH 19.6 (L) 26.0 - 34.0 pg   MCHC 29.8 (L) 30.0 - 36.0 g/dL   RDW 21.3 (H) 11.5 - 15.5 %   Platelets 281 150 - 400 K/uL   nRBC 0.0 0.0 - 0.2 %   Neutrophils Relative % 68 %   Neutro Abs 10.6 (H) 1.7 - 7.7 K/uL   Lymphocytes Relative 14 %   Lymphs Abs 2.2 0.7 - 4.0 K/uL   Monocytes Relative 13 %   Monocytes Absolute 2.1 (H) 0.1 - 1.0 K/uL   Eosinophils Relative 2 %   Eosinophils Absolute 0.4 0.0 - 0.5 K/uL   Basophils Relative 0 %   Basophils Absolute 0.0 0.0 - 0.1 K/uL   Immature Granulocytes 3 %   Abs Immature Granulocytes 0.39 (H) 0.00 - 0.07 K/uL   Burr Cells PRESENT     Comment: Performed at [facility], [place] [address], [place], [place] [address]  Comprehensive metabolic panel     Status: Abnormal   Collection Time: 12/24/22 11:02 PM  Result Value Ref Range   Sodium 135 135 - 145 mmol/L   Potassium 3.3 (L) 3.5 - 5.1 mmol/L   Chloride 109 98 - 111 mmol/L   CO2 17 (L) 22 - 32 mmol/L   Glucose, Bld 99 70 - 99 mg/dL    Comment: Glucose reference range applies only to samples taken after  fasting for at least 8 hours.   BUN 52 (H) 6 - 20 mg/dL   Creatinine, Ser 3.78 (H) 0.44 - 1.00 mg/dL   Calcium 8.0 (L) 8.9 - 10.3 mg/dL   Total Protein 6.0 (L) 6.5 - 8.1 g/dL   Albumin 2.4 (L) 3.5 - 5.0 g/dL   AST 14 (L) 15 - 41 U/L   ALT 41 0 - 44 U/L   Alkaline Phosphatase 148 (H) 38 - 126 U/L   Total Bilirubin 0.4 0.3 - 1.2 mg/dL   GFR, Estimated 14 (L) >60 mL/min    Comment: (NOTE) Calculated using the CKD-EPI Creatinine Equation (2021)    Anion gap 9 5 - 15    Comment: Performed at [facility], [place] [address], [place], [place] [address]  Protime-INR     Status: None   Collection Time: 12/24/22 11:02 PM  Result Value Ref Range   Prothrombin Time 14.8 11.4 - 15.2 seconds   INR 1.2 0.8 - 1.2    Comment: (NOTE) INR goal varies based on device and disease states. Performed at [facility], [place] [address], [place], [place] [address]   Procalcitonin - Baseline     Status: None   Collection Time: 12/24/22 11:02 PM  Result Value Ref Range   Procalcitonin 2.37 ng/mL    Comment:        Interpretation: PCT > 2 ng/mL: Systemic infection (sepsis) is likely, unless other causes are known. (NOTE)       Sepsis PCT Algorithm           Lower Respiratory Tract  Infection PCT Algorithm    ----------------------------     ----------------------------         PCT < 0.25 ng/mL                PCT < 0.10 ng/mL          Strongly encourage             Strongly discourage   discontinuation of antibiotics    initiation of antibiotics    ----------------------------     -----------------------------       PCT 0.25 - 0.50 ng/mL            PCT 0.10 - 0.25 ng/mL               OR       >80% decrease in PCT            Discourage initiation of                                            antibiotics      Encourage discontinuation           of antibiotics    ----------------------------     -----------------------------          PCT >= 0.50 ng/mL              PCT 0.26 - 0.50 ng/mL               AND       <80% decrease in PCT              Encourage initiation of                                             antibiotics       Encourage continuation           of antibiotics    ----------------------------     -----------------------------        PCT >= 0.50 ng/mL                  PCT > 0.50 ng/mL               AND         increase in PCT                  Strongly encourage                                      initiation of antibiotics    Strongly encourage escalation           of antibiotics                                     -----------------------------                                           PCT <= 0.25 ng/mL  OR                                        > 80% decrease in PCT                                      Discontinue / Do not initiate                                             antibiotics  Performed at [place] [address] [place], [place] [address]   Hemoglobin     Status: Abnormal   Collection Time: 12/25/22  3:52 AM  Result Value Ref Range   Hemoglobin 7.0 (L) 12.0 - 15.0 g/dL    Comment: Performed at [facility], [place] [address], [place], [place] [address]  CBC     Status: Abnormal   Collection Time: 12/25/22  3:52 AM  Result Value Ref Range   WBC 15.0 (H) 4.0 - 10.5 K/uL   RBC 3.57 (L) 3.87 - 5.11 MIL/uL   Hemoglobin 7.0 (L) 12.0 - 15.0 g/dL    Comment: Reticulocyte Hemoglobin testing may be clinically indicated, consider ordering this additional test [id number]    HCT 23.5 (L) 36.0 - 46.0 %   MCV 65.8 (L) 80.0 - 100.0 fL   MCH 19.6 (L) 26.0 - 34.0 pg   MCHC 29.8 (L) 30.0 - 36.0 g/dL   RDW 21.6 (H) 11.5 - 15.5 %   Platelets 270 150 - 400 K/uL   nRBC 0.0 0.0 - 0.2 %    Comment: Performed at [facility], [place] [address], [place], [place] [address]  Comprehensive metabolic  panel     Status: Abnormal   Collection Time: 12/25/22  3:52 AM  Result Value Ref Range   Sodium 137 135 - 145 mmol/L   Potassium 3.2 (L) 3.5 - 5.1 mmol/L   Chloride 110 98 - 111 mmol/L   CO2 20 (L) 22 - 32 mmol/L   Glucose, Bld 110 (H) 70 - 99 mg/dL    Comment: Glucose reference range applies only to samples taken after fasting for at least 8 hours.   BUN 50 (H) 6 - 20 mg/dL   Creatinine, Ser 3.48 (H) 0.44 - 1.00 mg/dL   Calcium 8.3 (L) 8.9 - 10.3 mg/dL   Total Protein 5.8 (L) 6.5 - 8.1 g/dL   Albumin 2.3 (L) 3.5 - 5.0 g/dL   AST 12 (L) 15 - 41 U/L   ALT 36 0 - 44 U/L   Alkaline Phosphatase 142 (H) 38 - 126 U/L   Total Bilirubin 0.3 0.3 - 1.2 mg/dL   GFR, Estimated 16 (L) >60 mL/min    Comment: (NOTE) Calculated using the CKD-EPI Creatinine Equation (2021)    Anion gap 7 5 - 15    Comment: Performed at [facility], [place] [address], [place], [place] [address]  Protime-INR     Status: Abnormal   Collection Time: 12/25/22  3:52 AM  Result Value Ref Range   Prothrombin Time 15.6 (H) 11.4 - 15.2 seconds   INR 1.3 (H) 0.8 - 1.2    Comment: (NOTE) INR goal  varies based on device and disease states. Performed at [facility], [place] [address], [place], [place] [address]   APTT     Status: Abnormal   Collection Time: 12/25/22  3:52 AM  Result Value Ref Range   aPTT 42 (H) 24 - 36 seconds    Comment:        IF BASELINE aPTT IS ELEVATED, SUGGEST PATIENT RISK ASSESSMENT BE USED TO DETERMINE APPROPRIATE ANTICOAGULANT THERAPY. Performed at [facility], [place] [address], [place], [place] [address]   Type and screen [place]     Status: None (Preliminary result)   Collection Time: 12/25/22  6:11 AM  Result Value Ref Range   ABO/RH(D) O POS    Antibody Screen NEG    Sample Expiration 12/28/2022,2359    Unit Number [id number]    Blood Component Type RED CELLS,LR    Unit division 00    Status of Unit  ALLOCATED    Transfusion Status OK TO TRANSFUSE    Crossmatch Result      Compatible Performed at [facility], [place] [address] [place], [place] [address]   Prepare RBC (crossmatch)     Status: None   Collection Time: 12/25/22  6:11 AM  Result Value Ref Range   Order Confirmation      ORDER PROCESSED BY BLOOD BANK Performed at [place] [address] [place], [place] [address]   Glucose, capillary     Status: Abnormal   Collection Time: 12/25/22  7:35 AM  Result Value Ref Range   Glucose-Capillary 112 (H) 70 - 99 mg/dL    Comment: Glucose reference range applies only to samples taken after fasting for at least 8 hours.   US Abdomen Limited RUQ (LIVER/GB)  Result Date: 12/24/2022 CLINICAL DATA:  Evaluate for acute cholecystitis. EXAM: ULTRASOUND ABDOMEN LIMITED RIGHT UPPER QUADRANT COMPARISON:  None Available. FINDINGS: Gallbladder: Echogenic gallstones and a mild-to-moderate amount of layering echogenic material are again seen within the dependent portion of the gallbladder lumen. The gallbladder wall measures 5.1 mm in thickness. A 3.3 cm x 1.9 cm x 2.2 cm anechoic area is seen adjacent to the gallbladder. Pericholecystic fluid is again seen. No sonographic Murphy sign noted by sonographer. Common bile duct: Diameter: 6.0 mm Liver: No focal lesion identified. Within normal limits in parenchymal echogenicity. Portal vein is patent on color Doppler imaging with normal direction of blood flow towards the liver. Other: None. IMPRESSION: 1. Cholelithiasis and gallbladder sludge with additional findings concerning for acute cholecystitis. 2. Cystic appearing area adjacent to the gallbladder which may represent sequelae associated with gallbladder perforation. Electronically Signed   By: Virgina Norfolk M.D.   On: 12/24/2022 21:51   US RENAL  Result Date: 12/24/2022 CLINICAL DATA:  Acute kidney injury. EXAM: RENAL / URINARY TRACT ULTRASOUND COMPLETE  COMPARISON:  None Available. FINDINGS: Right Kidney: Renal measurements: 14.2 cm x 6.9 cm x 7.5 cm = volume: 400.3 mL. Echogenicity within normal limits. No mass is visualized. Mild right-sided hydronephrosis is noted. A trace amount of perinephric fluid is also seen. Left Kidney: Renal measurements: 14.2 cm x 6.4 cm x 7.0 cm = volume: 331.2 mL. Echogenicity within normal limits. No mass or hydronephrosis visualized. Bladder: Appears normal for degree of bladder distention. The ureteral jets are not well visualized, as per the ultrasound technologist. Other: None. IMPRESSION: 1. Mild right-sided hydronephrosis with a trace amount of right-sided perinephric fluid. Electronically Signed   By: Hoover Browns  [place] M.D.   On: 12/24/2022 21:38   Portable chest 1 View  Result Date: 12/24/2022 CLINICAL DATA:  PICC line placement EXAM: PORTABLE CHEST 1 VIEW COMPARISON:  Portable exam 1812 hours compared to 11/30/2013 FINDINGS: Tip of RIGHT arm PICC line projects over SVC. Borderline enlargement of cardiac silhouette. Mediastinal contours and pulmonary vascularity normal. Minimal subsegmental atelectasis at LEFT base. Remaining lungs clear. No pleural effusion or pneumothorax. IMPRESSION: Tip of RIGHT arm PICC line projects over SVC. Minimal LEFT basilar subsegmental atelectasis. Electronically Signed   By: Lavonia Dana M.D.   On: 12/24/2022 21:25      Assessment/Plan Acute cholecystitis  Patient seen and examined and relevant labs and imaging reviewed. [place] 1/21 with findings consistent with acute cholecystitis and exam and history consistent as well. Total bilirubin is normal. Given her concurrent AKI/UTI and anemia think the risks of abdominal surgery with general anesthesia outweigh benefits at this time and instead favor antibiotics and percutaneous cholecystostomy. Once medically stabilized recommend interval laparoscopic cholecystectomy in several weeks. This can be done by [place] or surgical team closer to home  pending patient preference. IR already consulted. We will continue to follow  FEN: NPO for IR eval ID: was on abx OSH. none currently - ID on consult VTE: okay for chemical prophylaxis from surgical perspective pending IR eval  Per primary: MS Paraplegia AKI - Cr 3.48 BRBPR - 1 episode at OSH. Hgb today stable at 7.0 UTI Bacteremia - repeat [place] pending  I reviewed hospitalist notes, last 24 h vitals and pain scores, last 48 h intake and output, last 24 h labs and trends, and last 24 h imaging results.    Winferd Humphrey, [facility] Surgery 12/25/2022, 8:07 AM Please see Amion for pager number during day hours 7:00am-4:30pm

## 2022-12-25 NOTE — Progress Notes (Signed)
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 Apolonio Schneiders for botulinum toxin injections for spasticity - Presented from [facility] 1/21 with 3 days nausea vomiting loose stool - Admit 1/17 E. coli UTI AKI blood culture positive for Salmonella RUQ [place] = acute cholecystitis, creatinine increased to 3.1 and she had rectal bleeding with hemoglobin going from 9-6 and transfused--transferred because of progressive renal failure need for cholecystectomy  General surgery ID as well as IR consulted  Hospital-Problem based course  Salmonella bacteremia, acute cholecystitis - Awaiting echo as per ID to rule out any other source - Would resume Levaquin and Flagyl follow blood culture - Follow recommendations of ID - Per surgery she is not a candidate for laparoscopic procedure but will be going for IR drain placement  ?  GI, GU bleed - Hemoglobin at baseline in 2020 was 13 seems to be lower at 7 - Some report of GU bleeding - Would hold on GI input and await monitor clinically-if further bleeding occurs will need to consult specialist -Given 1 unit of packed red blood cells on 1/21  AKI on admit versus progression - Was on Mobic, ibuprofen, chlorthalidone, lisinopril prior to admit - [place] is 3.1% meaning intrinsic causes -Ultrasound kidney shows mild hydronephrosis on the right side with perinephric fluid -D/W urology Dr. 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, T6 spinal cord tumor resulting in neurogenic bladder in addition -Continue botulism toxin as an outpatient -Dosage adjusted tizanidine  Uncontrolled HTN - Hold lisinopril chlorthalidone - Start metoprolol 12.5 twice  daily  DM TY 2 - CBGs ranging 1 10-1 12, not on any coverage   DVT prophylaxis: SCD Code Status: Full Family Communication: Husband at bedside Disposition:  Status is: Inpatient Remains inpatient appropriate because: Needs multiple procedures     Subjective: Having some abdominal pain but manageable so far No chest pain No fever No nausea no vomiting  Objective: Vitals:   12/24/22 1945 12/24/22 2234 12/25/22 0312 12/25/22 0417  BP: (!) 153/85 (!) 163/92 (!) 152/76   Pulse: (!) 106 (!) 102 (!) 102   Resp: '18 17 20   '$ Temp: 99.6 F (37.6 C) 99.7 F (37.6 C) 99.1 F (37.3 C)   TempSrc: Oral Oral Oral   SpO2: 97% 96% 96%   Weight:    114.6 kg    Intake/Output Summary (Last 24 hours) at 12/25/2022 0811 Last data filed at 12/25/2022 0300 Gross per 24 hour  Intake --  Output 2200 ml  Net -2200 ml   Filed Weights   12/25/22 0417  Weight: 114.6 kg    Examination:  EOMI NCAT thick neck Mallampati 4 chest clear no rales rhonchi wheeze ROM intact CTAB no added sound Abdomen slightly distended Cannot raise lower extremities Moving upper extremities well Has a right-sided PICC line  Data Reviewed: personally reviewed   CBC    Component Value Date/Time   WBC 15.0 (H) 12/25/2022 0352   RBC 3.57 (L) 12/25/2022 0352   HGB 7.0 (L) 12/25/2022 0352   HGB 7.0 (L) 12/25/2022 0352   HCT 23.5 (L) 12/25/2022 0352   PLT 270 12/25/2022 0352   MCV 65.8 (L) 12/25/2022 0352   MCH 19.6 (L) 12/25/2022 9379  MCHC 29.8 (L) 12/25/2022 0352   RDW 21.6 (H) 12/25/2022 0352   LYMPHSABS 2.2 12/24/2022 2302   MONOABS 2.1 (H) 12/24/2022 2302   EOSABS 0.4 12/24/2022 2302   BASOSABS 0.0 12/24/2022 2302      Latest Ref Rng & Units 12/25/2022    3:52 AM 12/24/2022   11:02 PM 07/08/2019    6:24 PM  CMP  Glucose 70 - 99 mg/dL 110  99  136   BUN 6 - 20 mg/dL 50  52  8   Creatinine 0.44 - 1.00 mg/dL 3.48  3.78  0.67   Sodium 135 - 145 mmol/L 137  135  138   Potassium 3.5 - 5.1 mmol/L  3.2  3.3  3.4   Chloride 98 - 111 mmol/L 110  109  103   CO2 22 - 32 mmol/L '20  17  24   '$ Calcium 8.9 - 10.3 mg/dL 8.3  8.0  9.2   Total Protein 6.5 - 8.1 g/dL 5.8  6.0    Total Bilirubin 0.3 - 1.2 mg/dL 0.3  0.4    Alkaline Phos 38 - 126 U/L 142  148    AST 15 - 41 U/L 12  14    ALT 0 - 44 U/L 36  41       Radiology Studies: US Abdomen Limited RUQ (LIVER/GB)  Result Date: 12/24/2022 CLINICAL DATA:  Evaluate for acute cholecystitis. EXAM: ULTRASOUND ABDOMEN LIMITED RIGHT UPPER QUADRANT COMPARISON:  None Available. FINDINGS: Gallbladder: Echogenic gallstones and a mild-to-moderate amount of layering echogenic material are again seen within the dependent portion of the gallbladder lumen. The gallbladder wall measures 5.1 mm in thickness. A 3.3 cm x 1.9 cm x 2.2 cm anechoic area is seen adjacent to the gallbladder. Pericholecystic fluid is again seen. No sonographic Murphy sign noted by sonographer. Common bile duct: Diameter: 6.0 mm Liver: No focal lesion identified. Within normal limits in parenchymal echogenicity. Portal vein is patent on color Doppler imaging with normal direction of blood flow towards the liver. Other: None. IMPRESSION: 1. Cholelithiasis and gallbladder sludge with additional findings concerning for acute cholecystitis. 2. Cystic appearing area adjacent to the gallbladder which may represent sequelae associated with gallbladder perforation. Electronically Signed   By: Virgina Norfolk M.D.   On: 12/24/2022 21:51   US RENAL  Result Date: 12/24/2022 CLINICAL DATA:  Acute kidney injury. EXAM: RENAL / URINARY TRACT ULTRASOUND COMPLETE COMPARISON:  None Available. FINDINGS: Right Kidney: Renal measurements: 14.2 cm x 6.9 cm x 7.5 cm = volume: 400.3 mL. Echogenicity within normal limits. No mass is visualized. Mild right-sided hydronephrosis is noted. A trace amount of perinephric fluid is also seen. Left Kidney: Renal measurements: 14.2 cm x 6.4 cm x 7.0 cm = volume: 331.2 mL.  Echogenicity within normal limits. No mass or hydronephrosis visualized. Bladder: Appears normal for degree of bladder distention. The ureteral jets are not well visualized, as per the ultrasound technologist. Other: None. IMPRESSION: 1. Mild right-sided hydronephrosis with a trace amount of right-sided perinephric fluid. Electronically Signed   By: Virgina Norfolk M.D.   On: 12/24/2022 21:38   Portable chest 1 View  Result Date: 12/24/2022 CLINICAL DATA:  PICC line placement EXAM: PORTABLE CHEST 1 VIEW COMPARISON:  Portable exam 1812 hours compared to 11/30/2013 FINDINGS: Tip of RIGHT arm PICC line projects over SVC. Borderline enlargement of cardiac silhouette. Mediastinal contours and pulmonary vascularity normal. Minimal subsegmental atelectasis at LEFT base. Remaining lungs clear. No pleural effusion or pneumothorax. IMPRESSION: Tip of  RIGHT arm PICC line projects over SVC. Minimal LEFT basilar subsegmental atelectasis. Electronically Signed   By: Lavonia Dana M.D.   On: 12/24/2022 21:25     Scheduled Meds:  sodium chloride   Intravenous Once   Chlorhexidine Gluconate Cloth  6 each Topical Daily   diphenhydrAMINE  25 mg Intravenous Once   pantoprazole (PROTONIX) IV  40 mg Intravenous Q12H   Continuous Infusions:  acetaminophen     dextrose 5 % and 0.2 % NaCl with KCl 20 mEq 100 mL/hr at 12/24/22 2236     LOS: 1 day   Time spent: [place], MD [facility] To contact the attending provider between 7A-7P or the covering provider during after hours 7P-7A, please log into the web site [email or web address] and access using universal [place] password for that web site. If you do not have the password, please call the hospital operator.  12/25/2022, 8:11 AM

## 2022-12-26 DIAGNOSIS — K810 Acute cholecystitis: Secondary | ICD-10-CM | POA: Diagnosis not present

## 2022-12-26 LAB — CBC
HCT: 27.3 % — ABNORMAL LOW (ref 36.0–46.0)
Hemoglobin: 8.4 g/dL — ABNORMAL LOW (ref 12.0–15.0)
MCH: 21.2 pg — ABNORMAL LOW (ref 26.0–34.0)
MCHC: 30.8 g/dL (ref 30.0–36.0)
MCV: 68.8 fL — ABNORMAL LOW (ref 80.0–100.0)
Platelets: 334 10*3/uL (ref 150–400)
RBC: 3.97 MIL/uL (ref 3.87–5.11)
RDW: 25.2 % — ABNORMAL HIGH (ref 11.5–15.5)
WBC: 17.4 10*3/uL — ABNORMAL HIGH (ref 4.0–10.5)
nRBC: 0 % (ref 0.0–0.2)

## 2022-12-26 LAB — COMPREHENSIVE METABOLIC PANEL
ALT: 24 U/L (ref 0–44)
AST: 11 U/L — ABNORMAL LOW (ref 15–41)
Albumin: 2.3 g/dL — ABNORMAL LOW (ref 3.5–5.0)
Alkaline Phosphatase: 142 U/L — ABNORMAL HIGH (ref 38–126)
Anion gap: 8 (ref 5–15)
BUN: 31 mg/dL — ABNORMAL HIGH (ref 6–20)
CO2: 20 mmol/L — ABNORMAL LOW (ref 22–32)
Calcium: 8.1 mg/dL — ABNORMAL LOW (ref 8.9–10.3)
Chloride: 108 mmol/L (ref 98–111)
Creatinine, Ser: 2.06 mg/dL — ABNORMAL HIGH (ref 0.44–1.00)
GFR, Estimated: 29 mL/min — ABNORMAL LOW (ref >60)
Glucose, Bld: 111 mg/dL — ABNORMAL HIGH (ref 70–99)
Potassium: 2.9 mmol/L — ABNORMAL LOW (ref 3.5–5.1)
Sodium: 136 mmol/L (ref 135–145)
Total Bilirubin: 0.2 mg/dL — ABNORMAL LOW (ref 0.3–1.2)
Total Protein: 6 g/dL — ABNORMAL LOW (ref 6.5–8.1)

## 2022-12-26 LAB — TYPE AND SCREEN
ABO/RH(D): O POS
Antibody Screen: NEGATIVE
Unit division: 0

## 2022-12-26 LAB — BPAM RBC
Blood Product Expiration Date: 202402242359
ISSUE DATE / TIME: 202401220918
Unit Type and Rh: 5100

## 2022-12-26 LAB — GLUCOSE, CAPILLARY: Glucose-Capillary: 128 mg/dL — ABNORMAL HIGH (ref 70–99)

## 2022-12-26 MED ORDER — POTASSIUM CHLORIDE 2 MEQ/ML IV SOLN
INTRAVENOUS | Status: DC
  Filled 2022-12-26 (×4): qty 1000

## 2022-12-26 MED ORDER — POTASSIUM CHLORIDE CRYS ER 20 MEQ PO TBCR
40.0000 meq | EXTENDED_RELEASE_TABLET | Freq: Once | ORAL | Status: AC
  Administered 2022-12-26: 40 meq via ORAL
  Filled 2022-12-26: qty 2

## 2022-12-26 MED ORDER — METOPROLOL TARTRATE 5 MG/5ML IV SOLN
1.2500 mg | Freq: Once | INTRAVENOUS | Status: AC
  Administered 2022-12-26: 1.3 mg via INTRAVENOUS
  Filled 2022-12-26: qty 5

## 2022-12-26 MED ORDER — ENSURE ENLIVE PO LIQD
237.0000 mL | Freq: Two times a day (BID) | ORAL | Status: DC
  Administered 2022-12-26: 237 mL via ORAL

## 2022-12-26 NOTE — TOC Initial Note (Signed)
Transition of Care [facility]) - Initial/Assessment Note    Patient Details  Name: Debbie [name] MRN: [id number] Date of Birth: 06-27-1976  [facility]) CM/SW Contact:    Purcell Mouton, RN Phone Number: 12/26/2022, 1:27 PM  Clinical Narrative:                   Transition of Care (TOC) Screening Note   Patient Details  Name: Debbie [name] Date of Birth: 16-May-1976   Transition of Care [facility]) CM/SW Contact:    Purcell Mouton, RN Phone Number: 12/26/2022, 1:27 PM    Transition of Care Department (TOC) has reviewed patient and no TOC needs have been identified at this time. We will continue to monitor patient advancement through interdisciplinary progression rounds. If new patient transition needs arise, please place a TOC consult.         Patient Goals and CMS Choice            Expected Discharge Plan and Services                                              Prior Living Arrangements/Services                       Activities of Daily Living Home Assistive Devices/Equipment: Wheelchair ADL Screening (condition at time of admission) Patient's cognitive ability adequate to safely complete daily activities?: Yes Is the patient deaf or have difficulty hearing?: No Does the patient have difficulty seeing, even when wearing glasses/contacts?: No Does the patient have difficulty concentrating, remembering, or making decisions?: No Patient able to express need for assistance with ADLs?: Yes Does the patient have difficulty dressing or bathing?: No Independently performs ADLs?: No Communication: Independent Dressing (OT): Needs assistance Is this a change from baseline?: Change from baseline, expected to last <3days Grooming: Needs assistance Is this a change from baseline?: Change from baseline, expected to last <3 days Feeding: Independent Bathing: Needs assistance Is this a change from baseline?: Change from baseline,  expected to last <3 days Toileting: Needs assistance Is this a change from baseline?: Change from baseline, expected to last <3 days In/Out Bed: Independent with device (comment) Walks in Home: Independent Does the patient have difficulty walking or climbing stairs?: Yes Weakness of Legs: Both Weakness of Arms/Hands: None  Permission Sought/Granted                  Emotional Assessment              Admission diagnosis:  Acute cholecystitis [K81.0] Intractable pain [R52] Patient Active Problem List   Diagnosis Date Noted   Acute cholecystitis 12/24/2022   Pressure injury of skin 12/24/2022   Intractable pain 12/24/2022   Fibroid 10/20/2021   Abnormal CT scan, pelvis 10/13/2021   Encounter for cervical Pap smear with pelvic exam 10/13/2021   CAP (community acquired pneumonia) 11/26/2013   Tendonitis of elbow, left 05/26/2013   Neurogenic bladder 01/29/2013   Neurogenic bowel 01/29/2013   GERD (gastroesophageal reflux disease) 01/29/2013   Myalgia and myositis, unspecified 12/24/2012   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   Paraplegia ([place]) 11/18/2012   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/09/2012   Lower extremity weakness 11/08/2012   Hypokalemia 11/08/2012   HTN (hypertension) 11/08/2012   Myelopathy ([place]) 11/08/2012  Paraparesis of both lower limbs ([place]) 11/08/2012   PCP:  Neale Burly, MD Pharmacy:   [facility] [place], [place] [address] [place] [address] Phone: [phone] Fax: [phone]     Social Determinants of Health (SDOH) Social History: [place]: No Food Insecurity (12/24/2022)  Housing: Low Risk  (12/24/2022)  Transportation Needs: No Transportation Needs (12/24/2022)  Utilities: Not At Risk (12/24/2022)  Alcohol Screen: Low Risk  (10/13/2021)  Depression (PHQ2-9): Medium  Risk (10/13/2021)  Financial Resource Strain: Low Risk  (10/13/2021)  Physical Activity: Inactive (10/13/2021)  Social Connections: Socially Integrated (10/13/2021)  Stress: Stress Concern Present (10/13/2021)  Tobacco Use: Medium Risk (12/25/2022)   SDOH Interventions: Food Insecurity Interventions: Intervention Not Indicated Housing Interventions: Intervention Not Indicated Utilities Interventions: Intervention Not Indicated   Readmission Risk Interventions     No data to display

## 2022-12-26 NOTE — Progress Notes (Signed)
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 Apolonio [name] for botulinum toxin injections for spasticity - Presented from [facility] 1/21 with 3 days nausea vomiting loose stool - Admit 1/17 E. coli UTI AKI blood culture positive for Salmonella RUQ [place] = acute cholecystitis, creatinine increased to 3.1 and she had rectal bleeding with hemoglobin going from 9-6 and transfused--transferred because of progressive renal failure need for cholecystectomy  General surgery ID as well as IR consulted  1/21 transfusion 1 blood unit 1/22 PERC cholecystotomy performed successfully, echocardiogram performed no endocarditis, transfuse 1 unit PRBC  Hospital-Problem based course  Salmonella bacteremia, acute cholecystitis - TTE done on 1/22 nonsuggestive of any endocarditis unclear if need to workup further as we have a source - continue Levaquin Flagyl blood culture X1 performed 1/21 so far negative - Percutaneous cholecystotomy performed - Can have soft diet per surgery  ?  GI, GU bleed - Hemoglobin at baseline in 2020 was 13 seems to be lower at 7 -Given 1 unit of packed red blood cells on 1/21  AKI on admit versus progression - Was on Mobic, ibuprofen, chlorthalidone, lisinopril prior to admit - Debbie [name] is 3.1% meaning intrinsic causes -Ultrasound kidney shows mild hydronephrosis on the right side with perinephric fluid, CT dosnt show any stone sor any other issues - Discussed with urology but creatinine improving on fluids, do not need nephrology input at this stage-rechekc am labs  Moderate hypokalemia - Continue D5W 100 cc/H+ 40 of K - Check a.m. 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, T6 spinal cord tumor resulting in neurogenic bladder in  addition -Continue botulism toxin as an outpatient -Dosage adjusted tizanidine because of renal function  Uncontrolled HTN - Hold lisinopril chlorthalidone and probably would not resume again - Start metoprolol 12.5 twice daily  DM TY 2 - CBGs less than 150 so do not need any coverage   DVT prophylaxis: SCD Code Status: Full Family Communication: Husband at bedside Disposition:  Status is: Inpatient Remains inpatient appropriate because: Needs multiple procedures     Subjective:  Well coherent no fever chill n/v No other c/o   Objective: Vitals:   12/26/22 0348 12/26/22 0446 12/26/22 0600 12/26/22 0731  BP:  (!) 195/110 (!) 207/103 (!) 171/99  Pulse:  (!) 103  (!) 102  Resp:   17   Temp:      TempSrc:      SpO2:      Weight: 113.1 kg     Height:        Intake/Output Summary (Last 24 hours) at 12/26/2022 1045 Last data filed at 12/25/2022 2300 Gross per 24 hour  Intake 433 ml  Output 2030 ml  Net -1597 ml    Filed Weights   12/25/22 0417 12/26/22 0348  Weight: 114.6 kg 113.1 kg    Examination:  Eomi ncat  Cta b no added sound Abd obese swollen but at baseline Perc drain in place No Le edema Decreased power to both LE's  Data Reviewed: personally reviewed   CBC    Component Value Date/Time   WBC 17.4 (H) 12/26/2022 0400   RBC 3.97 12/26/2022 0400   HGB 8.4 (L) 12/26/2022 0400   HCT 27.3 (L) 12/26/2022 0400   PLT 334 12/26/2022 0400   MCV  68.8 (L) 12/26/2022 0400   MCH 21.2 (L) 12/26/2022 0400   MCHC 30.8 12/26/2022 0400   RDW 25.2 (H) 12/26/2022 0400   LYMPHSABS 2.2 12/24/2022 2302   MONOABS 2.1 (H) 12/24/2022 2302   EOSABS 0.4 12/24/2022 2302   BASOSABS 0.0 12/24/2022 2302      Latest Ref Rng & Units 12/26/2022    4:00 AM 12/25/2022    3:52 AM 12/24/2022   11:02 PM  CMP  Glucose 70 - 99 mg/dL 111  110  99   BUN 6 - 20 mg/dL 31  50  52   Creatinine 0.44 - 1.00 mg/dL 2.06  3.48  3.78   Sodium 135 - 145 mmol/L 136  137  135   Potassium  3.5 - 5.1 mmol/L 2.9  3.2  3.3   Chloride 98 - 111 mmol/L 108  110  109   CO2 22 - 32 mmol/L '20  20  17   '$ Calcium 8.9 - 10.3 mg/dL 8.1  8.3  8.0   Total Protein 6.5 - 8.1 g/dL 6.0  5.8  6.0   Total Bilirubin 0.3 - 1.2 mg/dL 0.2  0.3  0.4   Alkaline Phos 38 - 126 U/L 142  142  148   AST 15 - 41 U/L '11  12  14   '$ ALT 0 - 44 U/L 24  36  41      Radiology Studies: ECHOCARDIOGRAM COMPLETE  Result Date: 12/25/2022    ECHOCARDIOGRAM REPORT   Patient Name:   Debbie [name] Date of Exam: 12/25/2022 Medical Rec #:  [id number]      Height:       68.5 in Accession #:    [id number]     Weight:       252.6 lb Date of Birth:  [birth year]      BSA:          2.269 m Patient Age:    85 years       BP:           181/101 mmHg Patient Gender: F              HR:           101 bpm. Exam Location:  Inpatient Procedure: 2D Echo, Cardiac Doppler and Color Doppler Indications:    bacteremia  History:        Patient has no prior history of Echocardiogram examinations.                 Risk Factors:Hypertension.  Sonographer:    Debbie [name] Referring Phys: [id number] [place]  1. Left ventricular ejection fraction, by estimation, is 55 to 60%. The left ventricle has normal function. The left ventricle has no regional wall motion abnormalities. There is moderate concentric left ventricular hypertrophy. Left ventricular diastolic parameters were normal.  2. Right ventricular systolic function is normal. The right ventricular size is normal.  3. The mitral valve is normal in structure. No evidence of mitral valve regurgitation. No evidence of mitral stenosis.  4. The aortic valve is tricuspid. Aortic valve regurgitation is not visualized. No aortic stenosis is present.  5. Aortic dilatation noted. There is mild dilatation of the aortic root, measuring 39 mm.  6. The inferior vena cava is dilated in size with >50% respiratory variability, suggesting right atrial pressure of 8 mmHg. FINDINGS  Left Ventricle: Left  ventricular ejection fraction, by estimation, is 55 to 60%. The left ventricle has normal function. The  left ventricle has no regional wall motion abnormalities. The left ventricular internal cavity size was normal in size. There is  moderate concentric left ventricular hypertrophy. Left ventricular diastolic parameters were normal. Indeterminate filling pressures. Right Ventricle: The right ventricular size is normal. No increase in right ventricular wall thickness. Right ventricular systolic function is normal. Left Atrium: Left atrial size was normal in size. Right Atrium: Right atrial size was normal in size. Pericardium: There is no evidence of pericardial effusion. Mitral Valve: The mitral valve is normal in structure. No evidence of mitral valve regurgitation. No evidence of mitral valve stenosis. Tricuspid Valve: The tricuspid valve is normal in structure. Tricuspid valve regurgitation is not demonstrated. No evidence of tricuspid stenosis. Aortic Valve: The aortic valve is tricuspid. Aortic valve regurgitation is not visualized. No aortic stenosis is present. Pulmonic Valve: The pulmonic valve was normal in structure. Pulmonic valve regurgitation is not visualized. No evidence of pulmonic stenosis. Aorta: Aortic dilatation noted. There is mild dilatation of the aortic root, measuring 39 mm. Venous: The inferior vena cava is dilated in size with greater than 50% respiratory variability, suggesting right atrial pressure of 8 mmHg. IAS/Shunts: No atrial level shunt detected by color flow Doppler.  LEFT VENTRICLE PLAX 2D LVIDd:         4.70 cm      Diastology LVIDs:         3.30 cm      LV e' medial:    12.80 cm/s LV PW:         1.50 cm      LV E/e' medial:  9.8 LV IVS:        1.50 cm      LV e' lateral:   11.60 cm/s LVOT diam:     2.20 cm      LV E/e' lateral: 10.8 LV SV:         67 LV SV Index:   29 LVOT Area:     3.80 cm  LV Volumes (MOD) LV vol d, MOD A2C: 117.0 ml LV vol d, MOD A4C: 118.0 ml LV vol s, MOD  A2C: 41.9 ml LV vol s, MOD A4C: 47.2 ml LV SV MOD A2C:     75.1 ml LV SV MOD A4C:     118.0 ml LV SV MOD BP:      74.2 ml RIGHT VENTRICLE             IVC RV Basal diam:  3.20 cm     IVC diam: 2.10 cm RV S prime:     19.70 cm/s TAPSE (M-mode): 3.1 cm LEFT ATRIUM             Index        RIGHT ATRIUM           Index LA diam:        3.00 cm 1.32 cm/m   RA Area:     18.90 cm LA Vol (A2C):   67.1 ml 29.57 ml/m  RA Volume:   45.20 ml  19.92 ml/m LA Vol (A4C):   51.0 ml 22.47 ml/m LA Biplane Vol: 61.1 ml 26.92 ml/m  AORTIC VALVE LVOT Vmax:   93.20 cm/s LVOT Vmean:  64.600 cm/s LVOT VTI:    0.176 m  AORTA Ao Root diam: 3.90 cm Ao Asc diam:  3.30 cm MITRAL VALVE MV Area (PHT): 6.37 cm     SHUNTS MV Decel Time: 119 msec     Systemic VTI:  0.18 m  MV E velocity: 125.00 cm/s  Systemic Diam: 2.20 cm MV A velocity: 156.00 cm/s MV E/A ratio:  0.80 Skeet Latch MD Electronically signed by Skeet Latch MD Signature Date/Time: 12/25/2022/6:25:32 PM    Final    IR Perc Cholecystostomy  Result Date: 12/25/2022 INDICATION: Acute cholecystitis EXAM: ULTRASOUND AND FLUOROSCOPIC-GUIDED CHOLECYSTOSTOMY TUBE PLACEMENT COMPARISON:  US Abdomen, 12/24/2022.  CT AP 12/20/2022. MEDICATIONS: 2 g Ancef IV. Antibiotics were administered within an appropriate time frame prior to skin puncture. ANESTHESIA/SEDATION: Moderate (conscious) sedation was employed during this procedure. A total of Versed 2 mg and Fentanyl 100 mcg was administered intravenously. Moderate Sedation Time: 14 minutes. The patient's level of consciousness and vital signs were monitored continuously by radiology nursing throughout the procedure under my direct supervision. CONTRAST:  16m OMNIPAQUE IOHEXOL 300 MG/ML SOLN - administered into the gallbladder fossa. FLUOROSCOPY TIME:  Fluoroscopic dose; 10 mGy COMPLICATIONS: None immediate. PROCEDURE: Informed written consent was obtained from the patient after a discussion of the risks, benefits and alternatives to  treatment. Questions regarding the procedure were encouraged and answered. A timeout was performed prior to the initiation of the procedure. The right upper abdominal quadrant was prepped and draped in the usual sterile fashion, and a sterile drape was applied covering the operative field. Maximum barrier sterile technique with sterile gowns and gloves were used for the procedure. A timeout was performed prior to the initiation of the procedure. Local anesthesia was provided with 1% lidocaine with epinephrine. Ultrasound scanning of the right upper quadrant demonstrates a markedly dilated gallbladder. Utilizing a transhepatic approach, a 22 gauge needle was advanced into the gallbladder under direct ultrasound guidance. An ultrasound image was saved for documentation purposes. Appropriate intraluminal puncture was confirmed with the efflux of bile and advancement of an 0.018 wire into the gallbladder lumen. The needle was exchanged for an AMeridianset. A small amount of contrast was injected to confirm appropriate intraluminal positioning. Over a Benson wire, a 1612-French Cook cholecystomy tube was advanced into the gallbladder fossa, coiled and locked. Bile was aspirated and a small amount of contrast was injected as several post procedural spot radiographic images were obtained in various obliquities. The catheter was secured to the skin with suture, connected to a drainage bag and a dressing was placed. The patient tolerated the procedure well without immediate post procedural complication. IMPRESSION: 1. Successful placement of a 10 Fr percutaneous cholecystostomy drainage catheter, as above. 2. 80 mL of bilious drainage was aspirated. A sample was submitted to microbiology for analysis. PLAN: The patient will return to Vascular Interventional Radiology (VIR) for routine drainage catheter evaluation and exchange in 6-8 weeks. JMichaelle Birks MD [facility] Electronically Signed   By: JMichaelle BirksM.D.   On: 12/25/2022 16:24   CT RENAL STONE STUDY  Result Date: 12/25/2022 CLINICAL DATA:  Flank pain EXAM: CT ABDOMEN AND PELVIS WITHOUT CONTRAST TECHNIQUE: Multidetector CT imaging of the abdomen and pelvis was performed following the standard protocol without IV contrast. RADIATION DOSE REDUCTION: This exam was performed according to the departmental dose-optimization program which includes automated exposure control, adjustment of the mA and/or kV according to patient size and/or use of iterative reconstruction technique. COMPARISON:  Ultrasound 12/24/2022.  CT 12/20/2022 and older FINDINGS: Lower chest: There is some linear opacity lung bases likely scar or atelectasis. No pleural effusion. Coronary artery calcifications are seen. Please correlate for other coronary risk factors. Hepatobiliary: On this non IV contrast exam, the liver is grossly preserved.  However there is placement of a cholecystostomy tube. Within the location of the gallbladder the gallbladder is thickened with sludge or stones and adjacent stranding. There is some ill-defined fluid as well with some ascites along the margin of the liver. Small loculated area posterior to the gallbladder is seen as well on series 2, image 44 measuring 4.4 x 3.3 cm. This could represent sequela of recent intervention. Adjacent stranding as well. Pancreas: Global mild pancreatic atrophy, unchanged from previous. Spleen: Spleen is nonenlarged.  Small splenule. Adrenals/Urinary Tract: Adrenal glands are preserved. Foley catheter within the collapsed urinary bladder. No abnormal calcifications seen in the left kidney nor along the course of the left ureter. Mild ectasia of the right renal collecting system and ureter, nonspecific. No obstructing renal stones. The calcification seen in the right hemipelvis are likely vascular based on the prior examination. Increasing perinephric stranding on the right.  Stomach/Bowel: Stomach is collapsed. There is some wall thickening along the second portion of the duodenal related to the adjacent inflammatory process. No obstruction. The small bowel otherwise is nondilated on this non oral contrast exam. Large bowel has scattered debris in stool. No obstruction. Redundant course to the sigmoid colon. Vascular/Lymphatic: Normal caliber aorta and IVC with scattered vascular calcifications. There are several prominent but less than 1 cm in size nodes identified in the retroperitoneum and upper abdomen, not pathologic by size criteria. Reproductive: Uterus and bilateral adnexa are unremarkable. Other: Scattered ascites identified in the abdomen and pelvis. This is new from prior. Musculoskeletal: Mild degenerative changes along the spine and pelvis. There is some sclerosis identified along both femoral heads. Please correlate for evidence of AVN. IMPRESSION: Interval placement of a percutaneous cholecystostomy tube. The gallbladder is collapsed with wall thickening. There is surrounding mild ascites and ill-defined fluid. Significant stranding tracking along the right anterior perinephric space. Some free fluid in the pelvis as well. Mild ectasia of the right renal collecting system compared to previous without obstructing stone. Etiology is uncertain. Please correlate with patient's renal function and follow up. A contrast study may be of some benefit when clinically appropriate. Bladder is collapsed with Foley catheter. Sclerotic areas along the femoral heads bilaterally. Please correlate for any history of AVN. Electronically Signed   By: Jill Side M.D.   On: 12/25/2022 16:06   US Abdomen Limited RUQ (LIVER/GB)  Result Date: 12/24/2022 CLINICAL DATA:  Evaluate for acute cholecystitis. EXAM: ULTRASOUND ABDOMEN LIMITED RIGHT UPPER QUADRANT COMPARISON:  None Available. FINDINGS: Gallbladder: Echogenic gallstones and a mild-to-moderate amount of layering echogenic material are  again seen within the dependent portion of the gallbladder lumen. The gallbladder wall measures 5.1 mm in thickness. A 3.3 cm x 1.9 cm x 2.2 cm anechoic area is seen adjacent to the gallbladder. Pericholecystic fluid is again seen. No sonographic Murphy sign noted by sonographer. Common bile duct: Diameter: 6.0 mm Liver: No focal lesion identified. Within normal limits in parenchymal echogenicity. Portal vein is patent on color Doppler imaging with normal direction of blood flow towards the liver. Other: None. IMPRESSION: 1. Cholelithiasis and gallbladder sludge with additional findings concerning for acute cholecystitis. 2. Cystic appearing area adjacent to the gallbladder which may represent sequelae associated with gallbladder perforation. Electronically Signed   By: Virgina Norfolk M.D.   On: 12/24/2022 21:51   US RENAL  Result Date: 12/24/2022 CLINICAL DATA:  Acute kidney injury. EXAM: RENAL / URINARY TRACT ULTRASOUND COMPLETE COMPARISON:  None Available. FINDINGS: Right Kidney: Renal measurements: 14.2 cm x 6.9 cm x  7.5 cm = volume: 400.3 mL. Echogenicity within normal limits. No mass is visualized. Mild right-sided hydronephrosis is noted. A trace amount of perinephric fluid is also seen. Left Kidney: Renal measurements: 14.2 cm x 6.4 cm x 7.0 cm = volume: 331.2 mL. Echogenicity within normal limits. No mass or hydronephrosis visualized. Bladder: Appears normal for degree of bladder distention. The ureteral jets are not well visualized, as per the ultrasound technologist. Other: None. IMPRESSION: 1. Mild right-sided hydronephrosis with a trace amount of right-sided perinephric fluid. Electronically Signed   By: Virgina Norfolk M.D.   On: 12/24/2022 21:38   Portable chest 1 View  Result Date: 12/24/2022 CLINICAL DATA:  PICC line placement EXAM: PORTABLE CHEST 1 VIEW COMPARISON:  Portable exam 1812 hours compared to 11/30/2013 FINDINGS: Tip of RIGHT arm PICC line projects over SVC. Borderline  enlargement of cardiac silhouette. Mediastinal contours and pulmonary vascularity normal. Minimal subsegmental atelectasis at LEFT base. Remaining lungs clear. No pleural effusion or pneumothorax. IMPRESSION: Tip of RIGHT arm PICC line projects over SVC. Minimal LEFT basilar subsegmental atelectasis. Electronically Signed   By: Lavonia Dana M.D.   On: 12/24/2022 21:25     Scheduled Meds:  atorvastatin  20 mg Oral Daily   Chlorhexidine Gluconate Cloth  6 each Topical Daily   feeding supplement  237 mL Oral BID BM   metoprolol tartrate  12.5 mg Oral BID   pantoprazole  40 mg Oral Daily   sodium chloride flush  5 mL Intracatheter Q8H   tiZANidine  4 mg Oral Q12H   Continuous Infusions:  dextrose 5 % 1,000 mL with potassium chloride 40 mEq infusion 100 mL/hr at 12/26/22 0956   levofloxacin (LEVAQUIN) IV 750 mg (12/25/22 1616)   metronidazole 500 mg (12/26/22 0141)     LOS: 2 days   Time spent: [place], MD [facility] To contact the attending provider between 7A-7P or the covering provider during after hours 7P-7A, please log into the web site [email or web address] and access using universal [place] password for that web site. If you do not have the password, please call the hospital operator.  12/26/2022, 10:45 AM

## 2022-12-26 NOTE — Progress Notes (Signed)
Debbie [name] for Infectious Disease  Date of Admission:  12/24/2022     Principal Problem:   Acute cholecystitis Active Problems:   Pressure injury of skin   Intractable pain          Assessment: 47 year old female transferred from [facility] with acute cholecystitis worsening renal failure and Salmonella bacteremia.  ID engaged for management of bacteremia #Salmonella bacteremia #E. 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  No abdominal pain.  Found to have acute cholecystitis on imaging.  Hospital course was complicated by E. coli UTI and Salmonella bacteremia.  She was initiated on ceftriaxone(ecoli sens) and metronidazole and transitioned to Levaquin given the somewhat bacteremia. - Right upper quadrant ultrasound repeated: Showed cholelithiasis and gallbladder sludge with additional findings concerning for acute cholecystitis.  Cystic appearing area adjacent to gallbladder may represent sequela of gallbladder perforation. - General surgery has been engaged plan on outpatient f.u discussion of interval lap chole in 6-8 weeks - Radiology following and patient underwent cholecystotomy tube placement for perforated cholecystitis, cultures pending on 1/22 - 1/22 Called and spoke with [facility] lmicro lab per salmonella speciation and sens: Salmonella sent to state lab to identify and [facility] for sens (1/19) Recommendations:  -Continue levaquin and metronidazole. Await salmonella sens from OSH -Follow blood Cx, drain cultures -Follow Surgical plan   Microbiology:   Antibiotics: Levaquin and metronidazole   Cultures: Blood 1/17 blood Cx+ salmonella sp( S     SUBJECTIVE: Resting in bed. No new complaints. Interval: Afebrile overnight  Review of Systems: Review of Systems  All  other systems reviewed and are negative.    Scheduled Meds:  atorvastatin  20 mg Oral Daily   Chlorhexidine Gluconate Cloth  6 each Topical Daily   feeding supplement  237 mL Oral BID BM   metoprolol tartrate  12.5 mg Oral BID   pantoprazole  40 mg Oral Daily   sodium chloride flush  5 mL Intracatheter Q8H   tiZANidine  4 mg Oral Q12H   Continuous Infusions:  dextrose 5 % 1,000 mL with potassium chloride 40 mEq infusion 100 mL/hr at 12/26/22 0956   levofloxacin (LEVAQUIN) IV 750 mg (12/25/22 1616)   metronidazole 500 mg (12/26/22 1357)   PRN Meds:.albuterol, fentaNYL (SUBLIMAZE) injection, HYDROcodone-acetaminophen, ondansetron **OR** ondansetron (ZOFRAN) IV, sodium chloride flush Allergies  Allergen Reactions   Honey Bee Treatment [Bee Venom] Anaphylaxis and Itching   Baclofen     Headaches     OBJECTIVE: Vitals:   12/26/22 0446 12/26/22 0600 12/26/22 0731 12/26/22 1300  BP: (!) 195/110 (!) 207/103 (!) 171/99 131/78  Pulse: (!) 103  (!) 102 73  Resp:  17  17  Temp:    98.3 F (36.8 C)  TempSrc:    Oral  SpO2:    99%  Weight:      Height:       Body mass index is 37.36 kg/m.  Physical Exam Constitutional:      Appearance: Normal appearance.  HENT:     Head: Normocephalic and atraumatic.     Right Ear: Tympanic membrane normal.     Left Ear: Tympanic membrane normal.     Nose: Nose normal.     Mouth/Throat:     Mouth: Mucous membranes are moist.  Eyes:     Extraocular Movements: Extraocular  movements intact.     Conjunctiva/sclera: Conjunctivae normal.     Pupils: Pupils are equal, round, and reactive to light.  Cardiovascular:     Rate and Rhythm: Normal rate and regular rhythm.     Heart sounds: No murmur heard.    No friction rub. No gallop.  Pulmonary:     Effort: Pulmonary effort is normal.     Breath sounds: Normal breath sounds.  Abdominal:     General: Abdomen is flat.     Palpations: Abdomen is soft.  Musculoskeletal:        General: Normal  range of motion.  Skin:    General: Skin is warm and dry.  Neurological:     General: No focal deficit present.     Mental Status: She is alert and oriented to person, place, and time.  Psychiatric:        Mood and Affect: Mood normal.       Lab Results Lab Results  Component Value Date   WBC 17.4 (H) 12/26/2022   HGB 8.4 (L) 12/26/2022   HCT 27.3 (L) 12/26/2022   MCV 68.8 (L) 12/26/2022   PLT 334 12/26/2022    Lab Results  Component Value Date   CREATININE 2.06 (H) 12/26/2022   BUN 31 (H) 12/26/2022   NA 136 12/26/2022   K 2.9 (L) 12/26/2022   CL 108 12/26/2022   CO2 20 (L) 12/26/2022    Lab Results  Component Value Date   ALT 24 12/26/2022   AST 11 (L) 12/26/2022   ALKPHOS 142 (H) 12/26/2022   BILITOT 0.2 (L) 12/26/2022        Laurice Record, MD [facility] 12/26/2022, 4:52 PM

## 2022-12-26 NOTE — Progress Notes (Signed)
Progress Note     Subjective: Complains of pain in right shoulder and at drain site. Epigastric pain resolved. Having nausea with clear liquids and states low appetite.   Objective: Vital signs in last 24 hours: Temp:  [97.8 F (36.6 C)-99 F (37.2 C)] 99 F (37.2 C) (01/23 0347) Pulse Rate:  [92-106] 102 (01/23 0731) Resp:  [15-19] 17 (01/23 0600) BP: (150-207)/(84-112) 171/99 (01/23 0731) SpO2:  [96 %-100 %] 98 % (01/23 0347) Weight:  [113.1 kg] 113.1 kg (01/23 0348) Last BM Date : 12/24/22  Intake/Output from previous day: 01/22 0701 - 01/23 0700 In: 433 [I.V.:5; Blood:428] Out: 3086 [Urine:3050; Drains:80] Intake/Output this shift: No intake/output data recorded.  PE: General: pleasant, WD, female who is laying in bed in NAD Lungs: Respiratory effort nonlabored Abd: soft, ND, +BS, mild TTP RUQ at drain site. Drain with cloudy brown fluid MSK: all 4 extremities are symmetrical with no cyanosis, clubbing, or edema. Skin: warm and dry Psych: A&Ox3 with an appropriate affect.    Lab Results:  Recent Labs    12/25/22 0352 12/26/22 0400  WBC 15.0* 17.4*  HGB 7.0*  7.0* 8.4*  HCT 23.5* 27.3*  PLT 270 334   BMET Recent Labs    12/25/22 0352 12/26/22 0400  NA 137 136  K 3.2* 2.9*  CL 110 108  CO2 20* 20*  GLUCOSE 110* 111*  BUN 50* 31*  CREATININE 3.48* 2.06*  CALCIUM 8.3* 8.1*   PT/INR Recent Labs    12/24/22 2302 12/25/22 0352  LABPROT 14.8 15.6*  INR 1.2 1.3*   CMP     Component Value Date/Time   NA 136 12/26/2022 0400   K 2.9 (L) 12/26/2022 0400   CL 108 12/26/2022 0400   CO2 20 (L) 12/26/2022 0400   GLUCOSE 111 (H) 12/26/2022 0400   BUN 31 (H) 12/26/2022 0400   CREATININE 2.06 (H) 12/26/2022 0400   CREATININE 0.74 01/28/2018 0858   CALCIUM 8.1 (L) 12/26/2022 0400   PROT 6.0 (L) 12/26/2022 0400   ALBUMIN 2.3 (L) 12/26/2022 0400   AST 11 (L) 12/26/2022 0400   ALT 24 12/26/2022 0400   ALKPHOS 142 (H) 12/26/2022 0400   BILITOT 0.2  (L) 12/26/2022 0400   GFRNONAA 29 (L) 12/26/2022 0400   GFRAA >60 07/08/2019 1824   Lipase  No results found for: "LIPASE"     Studies/Results: ECHOCARDIOGRAM COMPLETE  Result Date: 12/25/2022    ECHOCARDIOGRAM REPORT   Patient Name:   Debbie [name] Date of Exam: 12/25/2022 Medical Rec #:  [id number]      Height:       68.5 in Accession #:    [id number]     Weight:       252.6 lb Date of Birth:  03-05-1976      BSA:          2.269 m Patient Age:    47 years       BP:           181/101 mmHg Patient Gender: F              HR:           101 bpm. Exam Location:  Inpatient Procedure: 2D Echo, Cardiac Doppler and Color Doppler Indications:    bacteremia  History:        Patient has no prior history of Echocardiogram examinations.                 Risk Factors:Hypertension.  Sonographer:    Phineas Douglas Referring Phys: [id number] [place] Di­az  1. Left ventricular ejection fraction, by estimation, is 55 to 60%. The left ventricle has normal function. The left ventricle has no regional wall motion abnormalities. There is moderate concentric left ventricular hypertrophy. Left ventricular diastolic parameters were normal.  2. Right ventricular systolic function is normal. The right ventricular size is normal.  3. The mitral valve is normal in structure. No evidence of mitral valve regurgitation. No evidence of mitral stenosis.  4. The aortic valve is tricuspid. Aortic valve regurgitation is not visualized. No aortic stenosis is present.  5. Aortic dilatation noted. There is mild dilatation of the aortic root, measuring 39 mm.  6. The inferior vena cava is dilated in size with >50% respiratory variability, suggesting right atrial pressure of 8 mmHg. FINDINGS  Left Ventricle: Left ventricular ejection fraction, by estimation, is 55 to 60%. The left ventricle has normal function. The left ventricle has no regional wall motion abnormalities. The left ventricular internal cavity size was normal in size.  There is  moderate concentric left ventricular hypertrophy. Left ventricular diastolic parameters were normal. Indeterminate filling pressures. Right Ventricle: The right ventricular size is normal. No increase in right ventricular wall thickness. Right ventricular systolic function is normal. Left Atrium: Left atrial size was normal in size. Right Atrium: Right atrial size was normal in size. Pericardium: There is no evidence of pericardial effusion. Mitral Valve: The mitral valve is normal in structure. No evidence of mitral valve regurgitation. No evidence of mitral valve stenosis. Tricuspid Valve: The tricuspid valve is normal in structure. Tricuspid valve regurgitation is not demonstrated. No evidence of tricuspid stenosis. Aortic Valve: The aortic valve is tricuspid. Aortic valve regurgitation is not visualized. No aortic stenosis is present. Pulmonic Valve: The pulmonic valve was normal in structure. Pulmonic valve regurgitation is not visualized. No evidence of pulmonic stenosis. Aorta: Aortic dilatation noted. There is mild dilatation of the aortic root, measuring 39 mm. Venous: The inferior vena cava is dilated in size with greater than 50% respiratory variability, suggesting right atrial pressure of 8 mmHg. IAS/Shunts: No atrial level shunt detected by color flow Doppler.  LEFT VENTRICLE PLAX 2D LVIDd:         4.70 cm      Diastology LVIDs:         3.30 cm      LV e' medial:    12.80 cm/s LV PW:         1.50 cm      LV E/e' medial:  9.8 LV IVS:        1.50 cm      LV e' lateral:   11.60 cm/s LVOT diam:     2.20 cm      LV E/e' lateral: 10.8 LV SV:         67 LV SV Index:   29 LVOT Area:     3.80 cm  LV Volumes (MOD) LV vol d, MOD A2C: 117.0 ml LV vol d, MOD A4C: 118.0 ml LV vol s, MOD A2C: 41.9 ml LV vol s, MOD A4C: 47.2 ml LV SV MOD A2C:     75.1 ml LV SV MOD A4C:     118.0 ml LV SV MOD BP:      74.2 ml RIGHT VENTRICLE             IVC RV Basal diam:  3.20 cm     IVC diam: 2.10 cm RV S prime:  19.70  cm/s TAPSE (M-mode): 3.1 cm LEFT ATRIUM             Index        RIGHT ATRIUM           Index LA diam:        3.00 cm 1.32 cm/m   RA Area:     18.90 cm LA Vol (A2C):   67.1 ml 29.57 ml/m  RA Volume:   45.20 ml  19.92 ml/m LA Vol (A4C):   51.0 ml 22.47 ml/m LA Biplane Vol: 61.1 ml 26.92 ml/m  AORTIC VALVE LVOT Vmax:   93.20 cm/s LVOT Vmean:  64.600 cm/s LVOT VTI:    0.176 m  AORTA Ao Root diam: 3.90 cm Ao Asc diam:  3.30 cm MITRAL VALVE MV Area (PHT): 6.37 cm     SHUNTS MV Decel Time: 119 msec     Systemic VTI:  0.18 m MV E velocity: 125.00 cm/s  Systemic Diam: 2.20 cm MV A velocity: 156.00 cm/s MV E/A ratio:  0.80 Skeet Latch MD Electronically signed by Skeet Latch MD Signature Date/Time: 12/25/2022/6:25:32 PM    Final    IR Perc Cholecystostomy  Result Date: 12/25/2022 INDICATION: Acute cholecystitis EXAM: ULTRASOUND AND FLUOROSCOPIC-GUIDED CHOLECYSTOSTOMY TUBE PLACEMENT COMPARISON:  US Abdomen, 12/24/2022.  CT AP 12/20/2022. MEDICATIONS: 2 g Ancef IV. Antibiotics were administered within an appropriate time frame prior to skin puncture. ANESTHESIA/SEDATION: Moderate (conscious) sedation was employed during this procedure. A total of Versed 2 mg and Fentanyl 100 mcg was administered intravenously. Moderate Sedation Time: 14 minutes. The patient's level of consciousness and vital signs were monitored continuously by radiology nursing throughout the procedure under my direct supervision. CONTRAST:  71m OMNIPAQUE IOHEXOL 300 MG/ML SOLN - administered into the gallbladder fossa. FLUOROSCOPY TIME:  Fluoroscopic dose; 10 mGy COMPLICATIONS: None immediate. PROCEDURE: Informed written consent was obtained from the patient after a discussion of the risks, benefits and alternatives to treatment. Questions regarding the procedure were encouraged and answered. A timeout was performed prior to the initiation of the procedure. The right upper abdominal quadrant was prepped and draped in the usual sterile  fashion, and a sterile drape was applied covering the operative field. Maximum barrier sterile technique with sterile gowns and gloves were used for the procedure. A timeout was performed prior to the initiation of the procedure. Local anesthesia was provided with 1% lidocaine with epinephrine. Ultrasound scanning of the right upper quadrant demonstrates a markedly dilated gallbladder. Utilizing a transhepatic approach, a 22 gauge needle was advanced into the gallbladder under direct ultrasound guidance. An ultrasound image was saved for documentation purposes. Appropriate intraluminal puncture was confirmed with the efflux of bile and advancement of an 0.018 wire into the gallbladder lumen. The needle was exchanged for an APothset. A small amount of contrast was injected to confirm appropriate intraluminal positioning. Over a Benson wire, a 1542-French Cook cholecystomy tube was advanced into the gallbladder fossa, coiled and locked. Bile was aspirated and a small amount of contrast was injected as several post procedural spot radiographic images were obtained in various obliquities. The catheter was secured to the skin with suture, connected to a drainage bag and a dressing was placed. The patient tolerated the procedure well without immediate post procedural complication. IMPRESSION: 1. Successful placement of a 10 Fr percutaneous cholecystostomy drainage catheter, as above. 2. 80 mL of bilious drainage was aspirated. A sample was submitted to microbiology for analysis. PLAN: The patient will return to Vascular Inter[facility] Radiology (  VIR) for routine drainage catheter evaluation and exchange in 6-8 weeks. Michaelle Birks, MD [facility] Radiology Electronically Signed   By: Michaelle Birks M.D.   On: 12/25/2022 16:24   CT RENAL STONE STUDY  Result Date: 12/25/2022 CLINICAL DATA:  Flank pain EXAM: CT ABDOMEN AND PELVIS WITHOUT CONTRAST TECHNIQUE: Multidetector  CT imaging of the abdomen and pelvis was performed following the standard protocol without IV contrast. RADIATION DOSE REDUCTION: This exam was performed according to the departmental dose-optimization program which includes automated exposure control, adjustment of the mA and/or kV according to patient size and/or use of iterative reconstruction technique. COMPARISON:  Ultrasound 12/24/2022.  CT 12/20/2022 and older FINDINGS: Lower chest: There is some linear opacity lung bases likely scar or atelectasis. No pleural effusion. Coronary artery calcifications are seen. Please correlate for other coronary risk factors. Hepatobiliary: On this non IV contrast exam, the liver is grossly preserved. However there is placement of a cholecystostomy tube. Within the location of the gallbladder the gallbladder is thickened with sludge or stones and adjacent stranding. There is some ill-defined fluid as well with some ascites along the margin of the liver. Small loculated area posterior to the gallbladder is seen as well on series 2, image 44 measuring 4.4 x 3.3 cm. This could represent sequela of recent intervention. Adjacent stranding as well. Pancreas: Global mild pancreatic atrophy, unchanged from previous. Spleen: Spleen is nonenlarged.  Small splenule. Adrenals/Urinary Tract: Adrenal glands are preserved. Foley catheter within the collapsed urinary bladder. No abnormal calcifications seen in the left kidney nor along the course of the left ureter. Mild ectasia of the right renal collecting system and ureter, nonspecific. No obstructing renal stones. The calcification seen in the right hemipelvis are likely vascular based on the prior examination. Increasing perinephric stranding on the right. Stomach/Bowel: Stomach is collapsed. There is some wall thickening along the second portion of the duodenal related to the adjacent inflammatory process. No obstruction. The small bowel otherwise is nondilated on this non oral  contrast exam. Large bowel has scattered debris in stool. No obstruction. Redundant course to the sigmoid colon. Vascular/Lymphatic: Normal caliber aorta and IVC with scattered vascular calcifications. There are several prominent but less than 1 cm in size nodes identified in the retroperitoneum and upper abdomen, not pathologic by size criteria. Reproductive: Uterus and bilateral adnexa are unremarkable. Other: Scattered ascites identified in the abdomen and pelvis. This is new from prior. Musculoskeletal: Mild degenerative changes along the spine and pelvis. There is some sclerosis identified along both femoral heads. Please correlate for evidence of AVN. IMPRESSION: Interval placement of a percutaneous cholecystostomy tube. The gallbladder is collapsed with wall thickening. There is surrounding mild ascites and ill-defined fluid. Significant stranding tracking along the right anterior perinephric space. Some free fluid in the pelvis as well. Mild ectasia of the right renal collecting system compared to previous without obstructing stone. Etiology is uncertain. Please correlate with patient's renal function and follow up. A contrast study may be of some benefit when clinically appropriate. Bladder is collapsed with Foley catheter. Sclerotic areas along the femoral heads bilaterally. Please correlate for any history of AVN. Electronically Signed   By: Jill Side M.D.   On: 12/25/2022 16:06   US Abdomen Limited RUQ (LIVER/GB)  Result Date: 12/24/2022 CLINICAL DATA:  Evaluate for acute cholecystitis. EXAM: ULTRASOUND ABDOMEN LIMITED RIGHT UPPER QUADRANT COMPARISON:  None Available. FINDINGS: Gallbladder: Echogenic gallstones and a mild-to-moderate amount of layering echogenic material are again seen within  the dependent portion of the gallbladder lumen. The gallbladder wall measures 5.1 mm in thickness. A 3.3 cm x 1.9 cm x 2.2 cm anechoic area is seen adjacent to the gallbladder. Pericholecystic fluid is again  seen. No sonographic Murphy sign noted by sonographer. Common bile duct: Diameter: 6.0 mm Liver: No focal lesion identified. Within normal limits in parenchymal echogenicity. Portal vein is patent on color Doppler imaging with normal direction of blood flow towards the liver. Other: None. IMPRESSION: 1. Cholelithiasis and gallbladder sludge with additional findings concerning for acute cholecystitis. 2. Cystic appearing area adjacent to the gallbladder which may represent sequelae associated with gallbladder perforation. Electronically Signed   By: Virgina Norfolk M.D.   On: 12/24/2022 21:51   US RENAL  Result Date: 12/24/2022 CLINICAL DATA:  Acute kidney injury. EXAM: RENAL / URINARY TRACT ULTRASOUND COMPLETE COMPARISON:  None Available. FINDINGS: Right Kidney: Renal measurements: 14.2 cm x 6.9 cm x 7.5 cm = volume: 400.3 mL. Echogenicity within normal limits. No mass is visualized. Mild right-sided hydronephrosis is noted. A trace amount of perinephric fluid is also seen. Left Kidney: Renal measurements: 14.2 cm x 6.4 cm x 7.0 cm = volume: 331.2 mL. Echogenicity within normal limits. No mass or hydronephrosis visualized. Bladder: Appears normal for degree of bladder distention. The ureteral jets are not well visualized, as per the ultrasound technologist. Other: None. IMPRESSION: 1. Mild right-sided hydronephrosis with a trace amount of right-sided perinephric fluid. Electronically Signed   By: Virgina Norfolk M.D.   On: 12/24/2022 21:38   Portable chest 1 View  Result Date: 12/24/2022 CLINICAL DATA:  PICC line placement EXAM: PORTABLE CHEST 1 VIEW COMPARISON:  Portable exam 1812 hours compared to 11/30/2013 FINDINGS: Tip of RIGHT arm PICC line projects over SVC. Borderline enlargement of cardiac silhouette. Mediastinal contours and pulmonary vascularity normal. Minimal subsegmental atelectasis at LEFT base. Remaining lungs clear. No pleural effusion or pneumothorax. IMPRESSION: Tip of RIGHT arm PICC  line projects over SVC. Minimal LEFT basilar subsegmental atelectasis. Electronically Signed   By: Lavonia Dana M.D.   On: 12/24/2022 21:25    Anti-infectives: Anti-infectives (From admission, onward)    Start     Dose/Rate Route Frequency Ordered Stop   12/25/22 1500  levofloxacin (LEVAQUIN) IVPB 750 mg        750 mg 100 mL/hr over 90 Minutes Intravenous Every 48 hours 12/25/22 1326     12/25/22 1427  ceFAZolin (ANCEF) IVPB 2g/100 mL premix        over 30 Minutes Intravenous Continuous PRN 12/25/22 1429 12/25/22 1427   12/25/22 1427  ceFAZolin (ANCEF) 2-4 GM/100ML-% IVPB       Note to Pharmacy: Tressie Stalker R: cabinet override      12/25/22 1427 12/26/22 0244   12/25/22 1415  metroNIDAZOLE (FLAGYL) IVPB 500 mg        500 mg 100 mL/hr over 60 Minutes Intravenous Every 12 hours 12/25/22 1326          Assessment/Plan  Acute cholecystitis  - s/p perc chole by IR 1/22. Culture pending - WBC up to 17, afebrile - continue clears this am and add ensure. Can ADAT lunch - plan for outpatient follow up for discussion of interval lap chole in 6-8 weeks   FEN: CLD, ensure, ADAT soft ID: [email or web address]/flagyl VTE: okay for chemical prophylaxis from surgical perspective   Per primary: MS Paraplegia AKI - Cr 3.48 BRBPR - 1 episode at OSH. Hgb today 8.4 UTI Bacteremia - repeat BCX pending  I reviewed Consultant IR notes, hospitalist notes, last 24 h vitals and pain scores, last 48 h intake and output, last 24 h labs and trends, and last 24 h imaging results.    LOS: 2 days   [facility] 12/26/2022, 8:22 AM Please see Amion for pager number during day hours 7:00am-4:30pm

## 2022-12-26 NOTE — Progress Notes (Addendum)
Referring Physician(s): Clent Demark  Supervising Physician: Michaelle Birks  Patient Status:  [facility] - In-pt  Chief Complaint: Cholecystitis   Subjective: Pt c/o some rt shoulder pain (likely referred from recent GB drain placement), occ nausea   Allergies: Honey bee treatment [bee venom] and Baclofen  Medications: Prior to Admission medications   Medication Sig Start Date End Date Taking? Authorizing Provider  atorvastatin (LIPITOR) 20 MG tablet Take 20 mg by mouth daily.   Yes [provider]  chlorthalidone (HYGROTON) 25 MG tablet Take 25 mg by mouth daily.   Yes [provider]  gabapentin (NEURONTIN) 300 MG capsule take 2 capsules by mouth three times a day Patient taking differently: Take 600 mg by mouth 3 (three) times daily. 11/30/16  Yes Meredith Staggers, MD  insulin glargine (LANTUS SOLOSTAR) 100 UNIT/ML Solostar Pen Inject 24 Units into the skin daily. 04/24/22  Yes [provider]  lisinopril (PRINIVIL,ZESTRIL) 40 MG tablet Take 1 tablet (40 mg total) by mouth daily. 12/06/12  Yes Angiulli, Lavon Paganini, PA-C  meloxicam (MOBIC) 15 MG tablet Take 1 tablet (15 mg total) by mouth daily. Patient taking differently: Take 15 mg by mouth daily as needed for pain. 02/23/16  Yes Meredith Staggers, MD  metFORMIN (GLUCOPHAGE) 1000 MG tablet Take 1,000 mg by mouth 2 (two) times daily with a meal.   Yes [provider]  pantoprazole (PROTONIX) 40 MG tablet Take 1 tablet (40 mg total) by mouth daily. 09/19/13  Yes Meredith Staggers, MD  tiZANidine (ZANAFLEX) 4 MG tablet TAKE 1.5 TABLETS BY MOUTH 4 TIMES DAILY. Patient taking differently: Take 4 mg by mouth 4 (four) times daily. 09/29/16  Yes Meredith Staggers, MD  norethindrone (MICRONOR) 0.35 MG tablet TAKE 1 TABLET(0.35 MG) BY MOUTH DAILY Patient not taking: Reported on 12/24/2022 11/13/22   Estill Dooms, NP     Vital Signs: BP (!) 171/99 (BP Location: Left Arm)   Pulse (!) 102   Temp 99 F  (37.2 C) (Oral)   Resp 17   Ht 5' 8.5" (1.74 m)   Wt 249 lb 5.4 oz (113.1 kg)   SpO2 98%   BMI 37.36 kg/m   Physical Exam awake/alert; GB drain intact, dressing dry, site not sig tender, OP 80 cc brown fluid; drain flushed without difficulty  Imaging: ECHOCARDIOGRAM COMPLETE  Result Date: 12/25/2022    ECHOCARDIOGRAM REPORT   Patient Name:   Debbie [name] Date of Exam: 12/25/2022 Medical Rec #:  [id number]      Height:       68.5 in Accession #:    [id number]     Weight:       252.6 lb Date of Birth:  1976/07/31      BSA:          2.269 m Patient Age:    47 years       BP:           181/101 mmHg Patient Gender: F              HR:           101 bpm. Exam Location:  Inpatient Procedure: 2D Echo, Cardiac Doppler and Color Doppler Indications:    bacteremia  History:        Patient has no prior history of Echocardiogram examinations.                 Risk Factors:Hypertension.  Sonographer:    Phineas Douglas Referring  Phys: [id number] SARA-MAIZ A THOMAS IMPRESSIONS  1. Left ventricular ejection fraction, by estimation, is 55 to 60%. The left ventricle has normal function. The left ventricle has no regional wall motion abnormalities. There is moderate concentric left ventricular hypertrophy. Left ventricular diastolic parameters were normal.  2. Right ventricular systolic function is normal. The right ventricular size is normal.  3. The mitral valve is normal in structure. No evidence of mitral valve regurgitation. No evidence of mitral stenosis.  4. The aortic valve is tricuspid. Aortic valve regurgitation is not visualized. No aortic stenosis is present.  5. Aortic dilatation noted. There is mild dilatation of the aortic root, measuring 39 mm.  6. The inferior vena cava is dilated in size with >50% respiratory variability, suggesting right atrial pressure of 8 mmHg. FINDINGS  Left Ventricle: Left ventricular ejection fraction, by estimation, is 55 to 60%. The left ventricle has normal function. The left  ventricle has no regional wall motion abnormalities. The left ventricular internal cavity size was normal in size. There is  moderate concentric left ventricular hypertrophy. Left ventricular diastolic parameters were normal. Indeterminate filling pressures. Right Ventricle: The right ventricular size is normal. No increase in right ventricular wall thickness. Right ventricular systolic function is normal. Left Atrium: Left atrial size was normal in size. Right Atrium: Right atrial size was normal in size. Pericardium: There is no evidence of pericardial effusion. Mitral Valve: The mitral valve is normal in structure. No evidence of mitral valve regurgitation. No evidence of mitral valve stenosis. Tricuspid Valve: The tricuspid valve is normal in structure. Tricuspid valve regurgitation is not demonstrated. No evidence of tricuspid stenosis. Aortic Valve: The aortic valve is tricuspid. Aortic valve regurgitation is not visualized. No aortic stenosis is present. Pulmonic Valve: The pulmonic valve was normal in structure. Pulmonic valve regurgitation is not visualized. No evidence of pulmonic stenosis. Aorta: Aortic dilatation noted. There is mild dilatation of the aortic root, measuring 39 mm. Venous: The inferior vena cava is dilated in size with greater than 50% respiratory variability, suggesting right atrial pressure of 8 mmHg. IAS/Shunts: No atrial level shunt detected by color flow Doppler.  LEFT VENTRICLE PLAX 2D LVIDd:         4.70 cm      Diastology LVIDs:         3.30 cm      LV e' medial:    12.80 cm/s LV PW:         1.50 cm      LV E/e' medial:  9.8 LV IVS:        1.50 cm      LV e' lateral:   11.60 cm/s LVOT diam:     2.20 cm      LV E/e' lateral: 10.8 LV SV:         67 LV SV Index:   29 LVOT Area:     3.80 cm  LV Volumes (MOD) LV vol d, MOD A2C: 117.0 ml LV vol d, MOD A4C: 118.0 ml LV vol s, MOD A2C: 41.9 ml LV vol s, MOD A4C: 47.2 ml LV SV MOD A2C:     75.1 ml LV SV MOD A4C:     118.0 ml LV SV MOD BP:       74.2 ml RIGHT VENTRICLE             IVC RV Basal diam:  3.20 cm     IVC diam: 2.10 cm RV S prime:     19.70 cm/s  TAPSE (M-mode): 3.1 cm LEFT ATRIUM             Index        RIGHT ATRIUM           Index LA diam:        3.00 cm 1.32 cm/m   RA Area:     18.90 cm LA Vol (A2C):   67.1 ml 29.57 ml/m  RA Volume:   45.20 ml  19.92 ml/m LA Vol (A4C):   51.0 ml 22.47 ml/m LA Biplane Vol: 61.1 ml 26.92 ml/m  AORTIC VALVE LVOT Vmax:   93.20 cm/s LVOT Vmean:  64.600 cm/s LVOT VTI:    0.176 m  AORTA Ao Root diam: 3.90 cm Ao Asc diam:  3.30 cm MITRAL VALVE MV Area (PHT): 6.37 cm     SHUNTS MV Decel Time: 119 msec     Systemic VTI:  0.18 m MV E velocity: 125.00 cm/s  Systemic Diam: 2.20 cm MV A velocity: 156.00 cm/s MV E/A ratio:  0.80 Skeet Latch MD Electronically signed by Skeet Latch MD Signature Date/Time: 12/25/2022/6:25:32 PM    Final    IR Perc Cholecystostomy  Result Date: 12/25/2022 INDICATION: Acute cholecystitis EXAM: ULTRASOUND AND FLUOROSCOPIC-GUIDED CHOLECYSTOSTOMY TUBE PLACEMENT COMPARISON:  US Abdomen, 12/24/2022.  CT AP 12/20/2022. MEDICATIONS: 2 g Ancef IV. Antibiotics were administered within an appropriate time frame prior to skin puncture. ANESTHESIA/SEDATION: Moderate (conscious) sedation was employed during this procedure. A total of Versed 2 mg and Fentanyl 100 mcg was administered intravenously. Moderate Sedation Time: 14 minutes. The patient's level of consciousness and vital signs were monitored continuously by radiology nursing throughout the procedure under my direct supervision. CONTRAST:  75m OMNIPAQUE IOHEXOL 300 MG/ML SOLN - administered into the gallbladder fossa. FLUOROSCOPY TIME:  Fluoroscopic dose; 10 mGy COMPLICATIONS: None immediate. PROCEDURE: Informed written consent was obtained from the patient after a discussion of the risks, benefits and alternatives to treatment. Questions regarding the procedure were encouraged and answered. A timeout was performed prior to  the initiation of the procedure. The right upper abdominal quadrant was prepped and draped in the usual sterile fashion, and a sterile drape was applied covering the operative field. Maximum barrier sterile technique with sterile gowns and gloves were used for the procedure. A timeout was performed prior to the initiation of the procedure. Local anesthesia was provided with 1% lidocaine with epinephrine. Ultrasound scanning of the right upper quadrant demonstrates a markedly dilated gallbladder. Utilizing a transhepatic approach, a 22 gauge needle was advanced into the gallbladder under direct ultrasound guidance. An ultrasound image was saved for documentation purposes. Appropriate intraluminal puncture was confirmed with the efflux of bile and advancement of an 0.018 wire into the gallbladder lumen. The needle was exchanged for an AHollyset. A small amount of contrast was injected to confirm appropriate intraluminal positioning. Over a Benson wire, a 1152-French Cook cholecystomy tube was advanced into the gallbladder fossa, coiled and locked. Bile was aspirated and a small amount of contrast was injected as several post procedural spot radiographic images were obtained in various obliquities. The catheter was secured to the skin with suture, connected to a drainage bag and a dressing was placed. The patient tolerated the procedure well without immediate post procedural complication. IMPRESSION: 1. Successful placement of a 10 Fr percutaneous cholecystostomy drainage catheter, as above. 2. 80 mL of bilious drainage was aspirated. A sample was submitted to microbiology for analysis. PLAN: The patient will return to Vascular Interventional Radiology (VIR) for routine  drainage catheter evaluation and exchange in 6-8 weeks. Michaelle Birks, MD [facility] Electronically Signed   By: Michaelle Birks M.D.   On: 12/25/2022 16:24   CT RENAL STONE STUDY  Result  Date: 12/25/2022 CLINICAL DATA:  Flank pain EXAM: CT ABDOMEN AND PELVIS WITHOUT CONTRAST TECHNIQUE: Multidetector CT imaging of the abdomen and pelvis was performed following the standard protocol without IV contrast. RADIATION DOSE REDUCTION: This exam was performed according to the departmental dose-optimization program which includes automated exposure control, adjustment of the mA and/or kV according to patient size and/or use of iterative reconstruction technique. COMPARISON:  Ultrasound 12/24/2022.  CT 12/20/2022 and older FINDINGS: Lower chest: There is some linear opacity lung bases likely scar or atelectasis. No pleural effusion. Coronary artery calcifications are seen. Please correlate for other coronary risk factors. Hepatobiliary: On this non IV contrast exam, the liver is grossly preserved. However there is placement of a cholecystostomy tube. Within the location of the gallbladder the gallbladder is thickened with sludge or stones and adjacent stranding. There is some ill-defined fluid as well with some ascites along the margin of the liver. Small loculated area posterior to the gallbladder is seen as well on series 2, image 44 measuring 4.4 x 3.3 cm. This could represent sequela of recent intervention. Adjacent stranding as well. Pancreas: Global mild pancreatic atrophy, unchanged from previous. Spleen: Spleen is nonenlarged.  Small splenule. Adrenals/Urinary Tract: Adrenal glands are preserved. Foley catheter within the collapsed urinary bladder. No abnormal calcifications seen in the left kidney nor along the course of the left ureter. Mild ectasia of the right renal collecting system and ureter, nonspecific. No obstructing renal stones. The calcification seen in the right hemipelvis are likely vascular based on the prior examination. Increasing perinephric stranding on the right. Stomach/Bowel: Stomach is collapsed. There is some wall thickening along the second portion of the duodenal related to  the adjacent inflammatory process. No obstruction. The small bowel otherwise is nondilated on this non oral contrast exam. Large bowel has scattered debris in stool. No obstruction. Redundant course to the sigmoid colon. Vascular/Lymphatic: Normal caliber aorta and IVC with scattered vascular calcifications. There are several prominent but less than 1 cm in size nodes identified in the retroperitoneum and upper abdomen, not pathologic by size criteria. Reproductive: Uterus and bilateral adnexa are unremarkable. Other: Scattered ascites identified in the abdomen and pelvis. This is new from prior. Musculoskeletal: Mild degenerative changes along the spine and pelvis. There is some sclerosis identified along both femoral heads. Please correlate for evidence of AVN. IMPRESSION: Interval placement of a percutaneous cholecystostomy tube. The gallbladder is collapsed with wall thickening. There is surrounding mild ascites and ill-defined fluid. Significant stranding tracking along the right anterior perinephric space. Some free fluid in the pelvis as well. Mild ectasia of the right renal collecting system compared to previous without obstructing stone. Etiology is uncertain. Please correlate with patient's renal function and follow up. A contrast study may be of some benefit when clinically appropriate. Bladder is collapsed with Foley catheter. Sclerotic areas along the femoral heads bilaterally. Please correlate for any history of AVN. Electronically Signed   By: Jill Side M.D.   On: 12/25/2022 16:06   US Abdomen Limited RUQ (LIVER/GB)  Result Date: 12/24/2022 CLINICAL DATA:  Evaluate for acute cholecystitis. EXAM: ULTRASOUND ABDOMEN LIMITED RIGHT UPPER QUADRANT COMPARISON:  None Available. FINDINGS: Gallbladder: Echogenic gallstones and a mild-to-moderate amount of layering echogenic material are again seen within the dependent portion  of the gallbladder lumen. The gallbladder wall measures 5.1 mm in thickness. A  3.3 cm x 1.9 cm x 2.2 cm anechoic area is seen adjacent to the gallbladder. Pericholecystic fluid is again seen. No sonographic Murphy sign noted by sonographer. Common bile duct: Diameter: 6.0 mm Liver: No focal lesion identified. Within normal limits in parenchymal echogenicity. Portal vein is patent on color Doppler imaging with normal direction of blood flow towards the liver. Other: None. IMPRESSION: 1. Cholelithiasis and gallbladder sludge with additional findings concerning for acute cholecystitis. 2. Cystic appearing area adjacent to the gallbladder which may represent sequelae associated with gallbladder perforation. Electronically Signed   By: Virgina Norfolk M.D.   On: 12/24/2022 21:51   US RENAL  Result Date: 12/24/2022 CLINICAL DATA:  Acute kidney injury. EXAM: RENAL / URINARY TRACT ULTRASOUND COMPLETE COMPARISON:  None Available. FINDINGS: Right Kidney: Renal measurements: 14.2 cm x 6.9 cm x 7.5 cm = volume: 400.3 mL. Echogenicity within normal limits. No mass is visualized. Mild right-sided hydronephrosis is noted. A trace amount of perinephric fluid is also seen. Left Kidney: Renal measurements: 14.2 cm x 6.4 cm x 7.0 cm = volume: 331.2 mL. Echogenicity within normal limits. No mass or hydronephrosis visualized. Bladder: Appears normal for degree of bladder distention. The ureteral jets are not well visualized, as per the ultrasound technologist. Other: None. IMPRESSION: 1. Mild right-sided hydronephrosis with a trace amount of right-sided perinephric fluid. Electronically Signed   By: Virgina Norfolk M.D.   On: 12/24/2022 21:38   Portable chest 1 View  Result Date: 12/24/2022 CLINICAL DATA:  PICC line placement EXAM: PORTABLE CHEST 1 VIEW COMPARISON:  Portable exam 1812 hours compared to 11/30/2013 FINDINGS: Tip of RIGHT arm PICC line projects over SVC. Borderline enlargement of cardiac silhouette. Mediastinal contours and pulmonary vascularity normal. Minimal subsegmental atelectasis at  LEFT base. Remaining lungs clear. No pleural effusion or pneumothorax. IMPRESSION: Tip of RIGHT arm PICC line projects over SVC. Minimal LEFT basilar subsegmental atelectasis. Electronically Signed   By: Lavonia Dana M.D.   On: 12/24/2022 21:25    Labs:  CBC: Recent Labs    12/24/22 2302 12/25/22 0352 12/26/22 0400  WBC 15.6* 15.0* 17.4*  HGB 7.4* 7.0*  7.0* 8.4*  HCT 24.8* 23.5* 27.3*  PLT 281 270 334    COAGS: Recent Labs    12/24/22 2302 12/25/22 0352  INR 1.2 1.3*  APTT  --  42*    BMP: Recent Labs    12/24/22 2302 12/25/22 0352 12/26/22 0400  NA 135 137 136  K 3.3* 3.2* 2.9*  CL 109 110 108  CO2 17* 20* 20*  GLUCOSE 99 110* 111*  BUN 52* 50* 31*  CALCIUM 8.0* 8.3* 8.1*  CREATININE 3.78* 3.48* 2.06*  GFRNONAA 14* 16* 29*    LIVER FUNCTION TESTS: Recent Labs    12/24/22 2302 12/25/22 0352 12/26/22 0400  BILITOT 0.4 0.3 0.2*  AST 14* 12* 11*  ALT 41 36 24  ALKPHOS 148* 142* 142*  PROT 6.0* 5.8* 6.0*  ALBUMIN 2.4* 2.3* 2.3*    Assessment and Plan: Pt with hx MS, paraplegia, AKI, UTI, bacteremia, acute cholecystitis; s/p perc GB drain 1/22 (10 fr to bag); afebrile, WBC 17.4(15), hgb 8.4(7), creat 2.06(3.48), t bili 0.2, K 2.9-replace; drain fl cx pend; latest blood cx neg to date; cont current tx/drain flushes/close output monitoring/lab checks/hydration/antbx; pt will need to keep drain in place at least 4-6 weeks unless cholecystectomy done in interim; f/u drain exchange in 6-8 weeks;  GB drain injection if problems arise  CT yesterday:   Interval placement of a percutaneous cholecystostomy tube. The gallbladder is collapsed with wall thickening. There is surrounding mild ascites and ill-defined fluid. Significant stranding tracking along the right anterior perinephric space. Some free fluid in the pelvis as well.   Mild ectasia of the right renal collecting system compared to previous without obstructing stone. Etiology is uncertain.  Please correlate with patient's renal function and follow up. A contrast study may be of some benefit when clinically appropriate. Bladder is collapsed with Foley catheter.   Sclerotic areas along the femoral heads bilaterally. Please correlate for any history of AVN.   Electronically Signed: D. Rowe Robert, PA-C 12/26/2022, 10:05 AM   I spent a total of 15 Minutes at the the patient's bedside AND on the patient's hospital floor or unit, greater than 50% of which was counseling/coordinating care for gallbladder drain    Patient ID: Debbie [name], female   DOB: 1976/01/15, 47 y.o.   MRN: [id number]

## 2022-12-27 ENCOUNTER — Other Ambulatory Visit (HOSPITAL_COMMUNITY): Payer: Self-pay

## 2022-12-27 ENCOUNTER — Inpatient Hospital Stay (HOSPITAL_COMMUNITY): Payer: Medicare HMO

## 2022-12-27 DIAGNOSIS — R52 Pain, unspecified: Secondary | ICD-10-CM | POA: Diagnosis not present

## 2022-12-27 DIAGNOSIS — K810 Acute cholecystitis: Secondary | ICD-10-CM | POA: Diagnosis not present

## 2022-12-27 LAB — COMPREHENSIVE METABOLIC PANEL
ALT: 16 U/L (ref 0–44)
AST: 13 U/L — ABNORMAL LOW (ref 15–41)
Albumin: 2.3 g/dL — ABNORMAL LOW (ref 3.5–5.0)
Alkaline Phosphatase: 157 U/L — ABNORMAL HIGH (ref 38–126)
Anion gap: 10 (ref 5–15)
BUN: 17 mg/dL (ref 6–20)
CO2: 20 mmol/L — ABNORMAL LOW (ref 22–32)
Calcium: 8.2 mg/dL — ABNORMAL LOW (ref 8.9–10.3)
Chloride: 106 mmol/L (ref 98–111)
Creatinine, Ser: 1.13 mg/dL — ABNORMAL HIGH (ref 0.44–1.00)
GFR, Estimated: >60 mL/min (ref >60)
Glucose, Bld: 139 mg/dL — ABNORMAL HIGH (ref 70–99)
Potassium: 3.2 mmol/L — ABNORMAL LOW (ref 3.5–5.1)
Sodium: 136 mmol/L (ref 135–145)
Total Bilirubin: 0.2 mg/dL — ABNORMAL LOW (ref 0.3–1.2)
Total Protein: 6.1 g/dL — ABNORMAL LOW (ref 6.5–8.1)

## 2022-12-27 LAB — MAGNESIUM: Magnesium: 1.1 mg/dL — ABNORMAL LOW (ref 1.7–2.4)

## 2022-12-27 LAB — CBC
HCT: 28 % — ABNORMAL LOW (ref 36.0–46.0)
Hemoglobin: 8.7 g/dL — ABNORMAL LOW (ref 12.0–15.0)
MCH: 21.3 pg — ABNORMAL LOW (ref 26.0–34.0)
MCHC: 31.1 g/dL (ref 30.0–36.0)
MCV: 68.6 fL — ABNORMAL LOW (ref 80.0–100.0)
Platelets: 382 10*3/uL (ref 150–400)
RBC: 4.08 MIL/uL (ref 3.87–5.11)
RDW: 25.1 % — ABNORMAL HIGH (ref 11.5–15.5)
WBC: 21.4 10*3/uL — ABNORMAL HIGH (ref 4.0–10.5)
nRBC: 0 % (ref 0.0–0.2)

## 2022-12-27 LAB — GLUCOSE, CAPILLARY
Glucose-Capillary: 149 mg/dL — ABNORMAL HIGH (ref 70–99)
Glucose-Capillary: 127 mg/dL — ABNORMAL HIGH (ref 70–99)

## 2022-12-27 MED ORDER — IOHEXOL 9 MG/ML PO SOLN
ORAL | Status: AC
  Filled 2022-12-27: qty 1000

## 2022-12-27 MED ORDER — LEVOFLOXACIN IN D5W 750 MG/150ML IV SOLN
750.0000 mg | Freq: Every day | INTRAVENOUS | Status: DC
  Filled 2022-12-27: qty 150

## 2022-12-27 MED ORDER — IOHEXOL 9 MG/ML PO SOLN
500.0000 mL | ORAL | Status: AC
  Administered 2022-12-27 (×2): 500 mL via ORAL

## 2022-12-27 MED ORDER — LEVOFLOXACIN IN D5W 750 MG/150ML IV SOLN
750.0000 mg | INTRAVENOUS | Status: AC
  Administered 2022-12-27: 750 mg via INTRAVENOUS
  Filled 2022-12-27: qty 150

## 2022-12-27 MED ORDER — METRONIDAZOLE 500 MG PO TABS
500.0000 mg | ORAL_TABLET | Freq: Two times a day (BID) | ORAL | Status: DC
  Filled 2022-12-27: qty 1

## 2022-12-27 MED ORDER — LEVOFLOXACIN 500 MG PO TABS
750.0000 mg | ORAL_TABLET | Freq: Every day | ORAL | Status: DC
  Administered 2022-12-28 – 2022-12-31 (×4): 750 mg via ORAL
  Filled 2022-12-27 (×4): qty 2

## 2022-12-27 MED ORDER — POTASSIUM CHLORIDE 2 MEQ/ML IV SOLN
INTRAVENOUS | Status: DC
  Filled 2022-12-27 (×3): qty 1000

## 2022-12-27 MED ORDER — POLYETHYLENE GLYCOL 3350 17 G PO PACK
17.0000 g | PACK | Freq: Two times a day (BID) | ORAL | Status: AC
  Administered 2022-12-27 (×2): 17 g via ORAL
  Filled 2022-12-27 (×2): qty 1

## 2022-12-27 MED ORDER — METRONIDAZOLE 500 MG/100ML IV SOLN
500.0000 mg | Freq: Three times a day (TID) | INTRAVENOUS | Status: DC
  Administered 2022-12-27 – 2022-12-30 (×8): 500 mg via INTRAVENOUS
  Filled 2022-12-27 (×8): qty 100

## 2022-12-27 NOTE — TOC Benefit Eligibility Note (Signed)
Patient Teacher, English as a foreign language completed.    The patient is currently admitted and upon discharge could be taking metronidazole (Flagyl) 500 mg tablets.  The current 28 day co-pay is $0.00.   The patient is currently admitted and upon discharge could be taking levofloxacin (Levaquin) 750 mg tablets.  The current 28 day co-pay is $0.00.   The patient is insured through [employer], [place] Patient Advocate Specialist [place] Patient Advocate Team Direct Number: [phone]  Fax: [phone]

## 2022-12-27 NOTE — Progress Notes (Signed)
[facility] PROGRESS NOTE    Progress Note  Debbie [name]  GHW:[id number] DOB: 03/17/1976 DOA: 12/24/2022 PCP: Neale Burly, MD     Brief Narrative:   Debbie [name] is an 47 y.o. 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 Lisette Abu for botulism injections for spasticity presented to [facility] on 12/24/2022 with 3 days of nausea vomiting and loose stools.  Cultures were positive for Salmonella right upper quadrant ultrasound showed acute cholecystitis with a rise in her creatinine and rectal bleeding her hemoglobin dropped from 9-6 she was transfused several units of packed red blood cells.  Transferred to [facility] due to progressive deterioration and need for cholecystectomy.  General surgery and ID were consulted, she status post percutaneous cholecystectomy placement performed by IR on 12/25/2022, 2D echo showed no endocarditis she was transfused 1 unit of packed red blood cells   Assessment/Plan:   Acute cholecystitis/Salmonella bacteremia: 2D echo showed no endocarditis. Cultures repeated which were negative. She is status post percutaneous cholecystectomy drain.  Is draining around 250 in the last 24 hours.  Follow-up with surgery in 6 to 8 weeks for discussion of lap chole She is currently on oral Levaquin  awaiting Salmonella sensitivities. Tmax of 98.9 her leukocytosis is rising. New right lower large and tenderness check a CT scan of the abdomen and pelvis  Acute kidney injury: With a baseline creatinine of less than 1, in the setting of Mobic ibuprofen ACE inhibitor and chlorthalidone. Rater than 1, kidney ultrasound showed right-sided perinephric fluid, CT scan showed no stones. Her creatinine is improved with conservative management.  Hypokalemia Patient potassium is improving with IV supplementation.  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  Uncontrolled hypertension: Started on oral metoprolol and antihypertensive medications were held on admission due to bacteremia and acute kidney injury. Blood pressures remain relatively stable.  Diabetes mellitus type 2 uncontrolled: A1c of 6.0 has not required insulin blood glucose ranging from 1 11-1 49 continue to monitor.   Sacral decubitus ulcer stage I RN Pressure Injury Documentation: Pressure Injury 12/24/22 Sacrum Mid Stage 1 -  Intact skin with non-blanchable redness of a localized area usually over a bony prominence. healing over wound area, skin is white and intact (Active)  12/24/22 1556  Location: Sacrum  Location Orientation: Mid  Staging: Stage 1 -  Intact skin with non-blanchable redness of a localized area usually over a bony prominence.  Wound Description (Comments): healing over wound area, skin is white and intact  Present on Admission: Yes  Dressing Type None 12/26/22 2100    DVT prophylaxis: lovenox Family Communication:none Status is: Inpatient Remains inpatient appropriate because: Salmonella bacteremia    Code Status:     Code Status Orders  (From admission, onward)           Start     Ordered   12/24/22 1621  Full code  Continuous       Question:  By:  Answer:  Consent: discussion documented in EHR   12/24/22 1627           Code Status History     Date Active Date Inactive Code Status Order ID Comments User Context   11/26/2013 1819 12/01/2013 1922 Full Code [id number]  Thurnell Lose, MD ED   11/18/2012 1529 12/06/2012 1336 Full Code [id number]  Mliss Sax, RN Inpatient   11/12/2012 [id number]  11/18/2012 1529 Full Code [id number]  Noelle Penner, RN Inpatient   11/08/2012 0010 11/12/2012 0827 Full Code [id number]  Theodis Blaze, MD ED         IV Access:   Peripheral IV   Procedures and diagnostic studies:   ECHOCARDIOGRAM COMPLETE  Result Date: 12/25/2022    ECHOCARDIOGRAM REPORT    Patient Name:   Debbie [name] Date of Exam: 12/25/2022 Medical Rec #:  [id number]      Height:       68.5 in Accession #:    [id number]     Weight:       252.6 lb Date of Birth:  May 11, 1976      BSA:          2.269 m Patient Age:    57 years       BP:           181/101 mmHg Patient Gender: F              HR:           101 bpm. Exam Location:  Inpatient Procedure: 2D Echo, Cardiac Doppler and Color Doppler Indications:    bacteremia  History:        Patient has no prior history of Echocardiogram examinations.                 Risk Factors:Hypertension.  Sonographer:    Phineas Douglas Referring Phys: [id number] [place]  1. Left ventricular ejection fraction, by estimation, is 55 to 60%. The left ventricle has normal function. The left ventricle has no regional wall motion abnormalities. There is moderate concentric left ventricular hypertrophy. Left ventricular diastolic parameters were normal.  2. Right ventricular systolic function is normal. The right ventricular size is normal.  3. The mitral valve is normal in structure. No evidence of mitral valve regurgitation. No evidence of mitral stenosis.  4. The aortic valve is tricuspid. Aortic valve regurgitation is not visualized. No aortic stenosis is present.  5. Aortic dilatation noted. There is mild dilatation of the aortic root, measuring 39 mm.  6. The inferior vena cava is dilated in size with >50% respiratory variability, suggesting right atrial pressure of 8 mmHg. FINDINGS  Left Ventricle: Left ventricular ejection fraction, by estimation, is 55 to 60%. The left ventricle has normal function. The left ventricle has no regional wall motion abnormalities. The left ventricular internal cavity size was normal in size. There is  moderate concentric left ventricular hypertrophy. Left ventricular diastolic parameters were normal. Indeterminate filling pressures. Right Ventricle: The right ventricular size is normal. No increase in right ventricular  wall thickness. Right ventricular systolic function is normal. Left Atrium: Left atrial size was normal in size. Right Atrium: Right atrial size was normal in size. Pericardium: There is no evidence of pericardial effusion. Mitral Valve: The mitral valve is normal in structure. No evidence of mitral valve regurgitation. No evidence of mitral valve stenosis. Tricuspid Valve: The tricuspid valve is normal in structure. Tricuspid valve regurgitation is not demonstrated. No evidence of tricuspid stenosis. Aortic Valve: The aortic valve is tricuspid. Aortic valve regurgitation is not visualized. No aortic stenosis is present. Pulmonic Valve: The pulmonic valve was normal in structure. Pulmonic valve regurgitation is not visualized. No evidence of pulmonic stenosis. Aorta: Aortic dilatation noted. There is mild dilatation of the aortic root, measuring 39 mm. Venous: The inferior vena cava is dilated in size with greater than 50% respiratory variability, suggesting right atrial pressure  of 8 mmHg. IAS/Shunts: No atrial level shunt detected by color flow Doppler.  LEFT VENTRICLE PLAX 2D LVIDd:         4.70 cm      Diastology LVIDs:         3.30 cm      LV e' medial:    12.80 cm/s LV PW:         1.50 cm      LV E/e' medial:  9.8 LV IVS:        1.50 cm      LV e' lateral:   11.60 cm/s LVOT diam:     2.20 cm      LV E/e' lateral: 10.8 LV SV:         67 LV SV Index:   29 LVOT Area:     3.80 cm  LV Volumes (MOD) LV vol d, MOD A2C: 117.0 ml LV vol d, MOD A4C: 118.0 ml LV vol s, MOD A2C: 41.9 ml LV vol s, MOD A4C: 47.2 ml LV SV MOD A2C:     75.1 ml LV SV MOD A4C:     118.0 ml LV SV MOD BP:      74.2 ml RIGHT VENTRICLE             IVC RV Basal diam:  3.20 cm     IVC diam: 2.10 cm RV S prime:     19.70 cm/s TAPSE (M-mode): 3.1 cm LEFT ATRIUM             Index        RIGHT ATRIUM           Index LA diam:        3.00 cm 1.32 cm/m   RA Area:     18.90 cm LA Vol (A2C):   67.1 ml 29.57 ml/m  RA Volume:   45.20 ml  19.92 ml/m LA Vol  (A4C):   51.0 ml 22.47 ml/m LA Biplane Vol: 61.1 ml 26.92 ml/m  AORTIC VALVE LVOT Vmax:   93.20 cm/s LVOT Vmean:  64.600 cm/s LVOT VTI:    0.176 m  AORTA Ao Root diam: 3.90 cm Ao Asc diam:  3.30 cm MITRAL VALVE MV Area (PHT): 6.37 cm     SHUNTS MV Decel Time: 119 msec     Systemic VTI:  0.18 m MV E velocity: 125.00 cm/s  Systemic Diam: 2.20 cm MV A velocity: 156.00 cm/s MV E/A ratio:  0.80 Skeet Latch MD Electronically signed by Skeet Latch MD Signature Date/Time: 12/25/2022/6:25:32 PM    Final    IR Perc Cholecystostomy  Result Date: 12/25/2022 INDICATION: Acute cholecystitis EXAM: ULTRASOUND AND FLUOROSCOPIC-GUIDED CHOLECYSTOSTOMY TUBE PLACEMENT COMPARISON:  US Abdomen, 12/24/2022.  CT AP 12/20/2022. MEDICATIONS: 2 g Ancef IV. Antibiotics were administered within an appropriate time frame prior to skin puncture. ANESTHESIA/SEDATION: Moderate (conscious) sedation was employed during this procedure. A total of Versed 2 mg and Fentanyl 100 mcg was administered intravenously. Moderate Sedation Time: 14 minutes. The patient's level of consciousness and vital signs were monitored continuously by radiology nursing throughout the procedure under my direct supervision. CONTRAST:  19m OMNIPAQUE IOHEXOL 300 MG/ML SOLN - administered into the gallbladder fossa. FLUOROSCOPY TIME:  Fluoroscopic dose; 10 mGy COMPLICATIONS: None immediate. PROCEDURE: Informed written consent was obtained from the patient after a discussion of the risks, benefits and alternatives to treatment. Questions regarding the procedure were encouraged and answered. A timeout was performed prior to the initiation of the procedure. The  right upper abdominal quadrant was prepped and draped in the usual sterile fashion, and a sterile drape was applied covering the operative field. Maximum barrier sterile technique with sterile gowns and gloves were used for the procedure. A timeout was performed prior to the initiation of the procedure. Local  anesthesia was provided with 1% lidocaine with epinephrine. Ultrasound scanning of the right upper quadrant demonstrates a markedly dilated gallbladder. Utilizing a transhepatic approach, a 22 gauge needle was advanced into the gallbladder under direct ultrasound guidance. An ultrasound image was saved for documentation purposes. Appropriate intraluminal puncture was confirmed with the efflux of bile and advancement of an 0.018 wire into the gallbladder lumen. The needle was exchanged for an Badger set. A small amount of contrast was injected to confirm appropriate intraluminal positioning. Over a Benson wire, a 4.2-French Cook cholecystomy tube was advanced into the gallbladder fossa, coiled and locked. Bile was aspirated and a small amount of contrast was injected as several post procedural spot radiographic images were obtained in various obliquities. The catheter was secured to the skin with suture, connected to a drainage bag and a dressing was placed. The patient tolerated the procedure well without immediate post procedural complication. IMPRESSION: 1. Successful placement of a 10 Fr percutaneous cholecystostomy drainage catheter, as above. 2. 80 mL of bilious drainage was aspirated. A sample was submitted to microbiology for analysis. PLAN: The patient will return to [facility]iology (VIR) for routine drainage catheter evaluation and exchange in 6-8 weeks. Michaelle Birks, MD [facility] Electronically Signed   By: Michaelle Birks M.D.   On: 12/25/2022 16:24   CT RENAL STONE STUDY  Result Date: 12/25/2022 CLINICAL DATA:  Flank pain EXAM: CT ABDOMEN AND PELVIS WITHOUT CONTRAST TECHNIQUE: Multidetector CT imaging of the abdomen and pelvis was performed following the standard protocol without IV contrast. RADIATION DOSE REDUCTION: This exam was performed according to the departmental dose-optimization program which includes automated  exposure control, adjustment of the mA and/or kV according to patient size and/or use of iterative reconstruction technique. COMPARISON:  Ultrasound 12/24/2022.  CT 12/20/2022 and older FINDINGS: Lower chest: There is some linear opacity lung bases likely scar or atelectasis. No pleural effusion. Coronary artery calcifications are seen. Please correlate for other coronary risk factors. Hepatobiliary: On this non IV contrast exam, the liver is grossly preserved. However there is placement of a cholecystostomy tube. Within the location of the gallbladder the gallbladder is thickened with sludge or stones and adjacent stranding. There is some ill-defined fluid as well with some ascites along the margin of the liver. Small loculated area posterior to the gallbladder is seen as well on series 2, image 44 measuring 4.4 x 3.3 cm. This could represent sequela of recent intervention. Adjacent stranding as well. Pancreas: Global mild pancreatic atrophy, unchanged from previous. Spleen: Spleen is nonenlarged.  Small splenule. Adrenals/Urinary Tract: Adrenal glands are preserved. Foley catheter within the collapsed urinary bladder. No abnormal calcifications seen in the left kidney nor along the course of the left ureter. Mild ectasia of the right renal collecting system and ureter, nonspecific. No obstructing renal stones. The calcification seen in the right hemipelvis are likely vascular based on the prior examination. Increasing perinephric stranding on the right. Stomach/Bowel: Stomach is collapsed. There is some wall thickening along the second portion of the duodenal related to the adjacent inflammatory process. No obstruction. The small bowel otherwise is nondilated on this non oral contrast exam. Large bowel has scattered debris in  stool. No obstruction. Redundant course to the sigmoid colon. Vascular/Lymphatic: Normal caliber aorta and IVC with scattered vascular calcifications. There are several prominent but less  than 1 cm in size nodes identified in the retroperitoneum and upper abdomen, not pathologic by size criteria. Reproductive: Uterus and bilateral adnexa are unremarkable. Other: Scattered ascites identified in the abdomen and pelvis. This is new from prior. Musculoskeletal: Mild degenerative changes along the spine and pelvis. There is some sclerosis identified along both femoral heads. Please correlate for evidence of AVN. IMPRESSION: Interval placement of a percutaneous cholecystostomy tube. The gallbladder is collapsed with wall thickening. There is surrounding mild ascites and ill-defined fluid. Significant stranding tracking along the right anterior perinephric space. Some free fluid in the pelvis as well. Mild ectasia of the right renal collecting system compared to previous without obstructing stone. Etiology is uncertain. Please correlate with patient's renal function and follow up. A contrast study may be of some benefit when clinically appropriate. Bladder is collapsed with Foley catheter. Sclerotic areas along the femoral heads bilaterally. Please correlate for any history of AVN. Electronically Signed   By: Jill Side M.D.   On: 12/25/2022 16:06     Medical Consultants:   None.   Subjective:    Debbie [name] relates she feels better just complaining of mild right lower quadrant pain  Objective:    Vitals:   12/26/22 0731 12/26/22 1300 12/26/22 2026 12/26/22 2259  BP: (!) 171/99 131/78 (!) 191/96 105/65  Pulse: (!) 102 73 98 74  Resp:  17 (!) 22   Temp:  98.3 F (36.8 C) 98.9 F (37.2 C) 98 F (36.7 C)  TempSrc:  Oral Oral Oral  SpO2:  99% 99% 98%  Weight:      Height:       SpO2: 98 % O2 Flow Rate (L/min): 2 L/min   Intake/Output Summary (Last 24 hours) at 12/27/2022 1015 Last data filed at 12/27/2022 0900 Gross per 24 hour  Intake 5 ml  Output 4000 ml  Net -3995 ml   Filed Weights   12/25/22 0417 12/26/22 0348  Weight: 114.6 kg 113.1 kg    Exam: General  exam: In no acute distress. Respiratory system: Good air movement and clear to auscultation. Cardiovascular system: S1 & S2 heard, RRR. No JVD. Gastrointestinal system: Abdomen is nondistended, soft and right lower quadrant tenderness Extremities: No pedal edema. Skin: No rashes, lesions or ulcers Psychiatry: Judgement and insight appear normal. Mood & affect appropriate.    Data Reviewed:    Labs: Basic Metabolic Panel: Recent Labs  Lab 12/24/22 2302 12/25/22 0352 12/26/22 0400 12/27/22 0415  NA 135 137 136 136  K 3.3* 3.2* 2.9* 3.2*  CL 109 110 108 106  CO2 17* 20* 20* 20*  GLUCOSE 99 110* 111* 139*  BUN 52* 50* 31* 17  CREATININE 3.78* 3.48* 2.06* 1.13*  CALCIUM 8.0* 8.3* 8.1* 8.2*  MG  --   --   --  1.1*   GFR Estimated Creatinine Clearance: 81.9 mL/min (A) (by C-G formula based on SCr of 1.13 mg/dL (H)). Liver Function Tests: Recent Labs  Lab 12/24/22 2302 12/25/22 0352 12/26/22 0400 12/27/22 0415  AST 14* 12* 11* 13*  ALT 41 36 24 16  ALKPHOS 148* 142* 142* 157*  BILITOT 0.4 0.3 0.2* 0.2*  PROT 6.0* 5.8* 6.0* 6.1*  ALBUMIN 2.4* 2.3* 2.3* 2.3*   No results for input(s): "LIPASE", "AMYLASE" in the last 168 hours. No results for input(s): "AMMONIA" in the  last 168 hours. Coagulation profile Recent Labs  Lab 12/24/22 2302 12/25/22 0352  INR 1.2 1.3*   COVID-19 Labs  No results for input(s): "DDIMER", "FERRITIN", "LDH", "CRP" in the last 72 hours.  No results found for: "SARSCOV2NAA"  CBC: Recent Labs  Lab 12/24/22 2302 12/25/22 0352 12/26/22 0400 12/27/22 0415  WBC 15.6* 15.0* 17.4* 21.4*  NEUTROABS 10.6*  --   --   --   HGB 7.4* 7.0*  7.0* 8.4* 8.7*  HCT 24.8* 23.5* 27.3* 28.0*  MCV 65.6* 65.8* 68.8* 68.6*  PLT 281 270 334 382   Cardiac Enzymes: No results for input(s): "CKTOTAL", "CKMB", "CKMBINDEX", "TROPONINI" in the last 168 hours. BNP (last 3 results) No results for input(s): "PROBNP" in the last 8760 hours. CBG: Recent Labs   Lab 12/25/22 0735 12/26/22 0753 12/27/22 0413 12/27/22 0807  GLUCAP 112* 128* 149* 127*   D-Dimer: No results for input(s): "DDIMER" in the last 72 hours. Hgb A1c: Recent Labs    12/24/22 1924  HGBA1C 6.0*   Lipid Profile: No results for input(s): "CHOL", "HDL", "LDLCALC", "TRIG", "CHOLHDL", "LDLDIRECT" in the last 72 hours. Thyroid function studies: Recent Labs    12/24/22 1924  TSH 1.101   Anemia work up: No results for input(s): "VITAMINB12", "FOLATE", "FERRITIN", "TIBC", "IRON", "RETICCTPCT" in the last 72 hours. Sepsis Labs: Recent Labs  Lab 12/24/22 1924 12/24/22 2302 12/25/22 0352 12/26/22 0400 12/27/22 0415  PROCALCITON  --  2.37  --   --   --   WBC  --  15.6* 15.0* 17.4* 21.4*  LATICACIDVEN 0.9  --   --   --   --    Microbiology Recent Results (from the past 240 hour(s))  Culture, blood (Routine X 2) w Reflex to ID Panel     Status: None (Preliminary result)   Collection Time: 12/24/22  7:24 PM   Specimen: BLOOD  Result Value Ref Range Status   Specimen Description   Final    BLOOD SITE NOT SPECIFIED Performed at [facility] [address], [place], [place] [address]    Special Requests   Final    BOTTLES DRAWN AEROBIC ONLY Blood Culture adequate volume Performed at [facility] [address] [place], [place] [address]    Culture   Final    NO GROWTH 2 DAYS Performed at [facility] [address], [place], [place] [address]    Report Status PENDING  Incomplete  Culture, blood (Routine X 2) w Reflex to ID Panel     Status: None (Preliminary result)   Collection Time: 12/24/22  7:24 PM   Specimen: BLOOD  Result Value Ref Range Status   Specimen Description   Final    BLOOD SITE NOT SPECIFIED Performed at [facility] [address] [place], [place] [address]    Special Requests   Final    BOTTLES DRAWN AEROBIC ONLY Blood Culture adequate volume Performed at [facility] [address] [place], [place] [address]    Culture   Final    NO GROWTH 2 DAYS Performed at [facility] [address], [place], [place] [address]    Report Status PENDING  Incomplete  Aerobic/Anaerobic Culture w Gram Stain (surgical/deep wound)     Status: None (Preliminary result)   Collection Time: 12/25/22  2:32 PM   Specimen: BILE  Result Value Ref Range Status   Specimen Description   Final    BILE  Performed at [facility], [place] [address] [place], [place] [address]    Special Requests   Final    NONE Performed at [facility], [place] [address], [place], [place] [address]    Gram Stain NO WBC SEEN NO ORGANISMS SEEN   Final   Culture   Final    NO GROWTH 2 DAYS Performed at [facility], [place] [address] [place], [place] [address]    Report Status PENDING  Incomplete     Medications:    atorvastatin  20 mg Oral Daily   Chlorhexidine Gluconate Cloth  6 each Topical Daily   [START ON 12/28/2022] levofloxacin  750 mg Oral Daily   metoprolol tartrate  12.5 mg Oral BID   metroNIDAZOLE  500 mg Oral Q12H   pantoprazole  40 mg Oral Daily   sodium chloride flush  5 mL Intracatheter Q8H   tiZANidine  4 mg Oral Q12H   Continuous Infusions:  dextrose 5 % 1,000 mL with potassium chloride 40 mEq infusion 100 mL/hr at 12/27/22 0630   levofloxacin (LEVAQUIN) IV 750 mg (12/27/22 0920)      LOS: 3 days   Charlynne Cousins  [facility]  12/27/2022, 10:15 AM

## 2022-12-27 NOTE — Care Management Important Message (Signed)
Important Message  Patient Details IM Letter given. Name: Debbie [name] MRN: [id number] Date of Birth: 1976-11-30   Medicare Important Message Given:  Yes     Kerin Salen 12/27/2022, 2:44 PM

## 2022-12-27 NOTE — Progress Notes (Signed)
Progress Note     Subjective: Having some lower abdominal pain this am. Also with epigastric pain she attributes to episode of emesis yesterday after drinking ensure. She does not know if emesis was bloody or black. She ate toast after incident without further nausea or emesis. BM on date of transfer here and not since. She states NT noted some blood with pericare yesterday with source unclear. She states she does not think blood would be related to menses as she normally has heavy bleeding  Husband and friend bedside  Objective: Vital signs in last 24 hours: Temp:  [98 F (36.7 C)-98.9 F (37.2 C)] 98 F (36.7 C) (01/23 2259) Pulse Rate:  [73-98] 74 (01/23 2259) Resp:  [17-22] 22 (01/23 2026) BP: (105-191)/(65-96) 105/65 (01/23 2259) SpO2:  [98 %-99 %] 98 % (01/23 2259) Last BM Date : 12/24/22  Intake/Output from previous day: 01/23 0701 - 01/24 0700 In: 5 [I.V.:5] Out: 3750 [Urine:3350; Drains:400] Intake/Output this shift: Total I/O In: -  Out: 250 [Drains:250]  PE: General: pleasant, WD, female who is laying in bed in NAD Lungs: Respiratory effort nonlabored Abd: soft, ND, +BS, mild TTP RUQ at drain site, epigastrium and across lower abdomen. No rebound or guarding. Drain with cloudy brown fluid - 400cc/24 h documented MSK: all 4 extremities are symmetrical with no cyanosis, clubbing, or edema. Skin: warm and dry Psych: A&Ox3 with an appropriate affect.    Lab Results:  Recent Labs    12/26/22 0400 12/27/22 0415  WBC 17.4* 21.4*  HGB 8.4* 8.7*  HCT 27.3* 28.0*  PLT 334 382    BMET Recent Labs    12/26/22 0400 12/27/22 0415  NA 136 136  K 2.9* 3.2*  CL 108 106  CO2 20* 20*  GLUCOSE 111* 139*  BUN 31* 17  CREATININE 2.06* 1.13*  CALCIUM 8.1* 8.2*    PT/INR Recent Labs    12/24/22 2302 12/25/22 0352  LABPROT 14.8 15.6*  INR 1.2 1.3*    CMP     Component Value Date/Time   NA 136 12/27/2022 0415   K 3.2 (L) 12/27/2022 0415   CL 106  12/27/2022 0415   CO2 20 (L) 12/27/2022 0415   GLUCOSE 139 (H) 12/27/2022 0415   BUN 17 12/27/2022 0415   CREATININE 1.13 (H) 12/27/2022 0415   CREATININE 0.74 01/28/2018 0858   CALCIUM 8.2 (L) 12/27/2022 0415   PROT 6.1 (L) 12/27/2022 0415   ALBUMIN 2.3 (L) 12/27/2022 0415   AST 13 (L) 12/27/2022 0415   ALT 16 12/27/2022 0415   ALKPHOS 157 (H) 12/27/2022 0415   BILITOT 0.2 (L) 12/27/2022 0415   GFRNONAA >60 12/27/2022 0415   GFRAA >60 07/08/2019 1824   Lipase  No results found for: "LIPASE"     Studies/Results: ECHOCARDIOGRAM COMPLETE  Result Date: 12/25/2022    ECHOCARDIOGRAM REPORT   Patient Name:   JOSEFINA RYNDERS Date of Exam: 12/25/2022 Medical Rec #:  [id number]      Height:       68.5 in Accession #:    [id number]     Weight:       252.6 lb Date of Birth:  1976/04/15      BSA:          2.269 m Patient Age:    32 years       BP:           181/101 mmHg Patient Gender: F  HR:           101 bpm. Exam Location:  Inpatient Procedure: 2D Echo, Cardiac Doppler and Color Doppler Indications:    bacteremia  History:        Patient has no prior history of Echocardiogram examinations.                 Risk Factors:Hypertension.  Sonographer:    Phineas Douglas Referring Phys: [id number] [place]  1. Left ventricular ejection fraction, by estimation, is 55 to 60%. The left ventricle has normal function. The left ventricle has no regional wall motion abnormalities. There is moderate concentric left ventricular hypertrophy. Left ventricular diastolic parameters were normal.  2. Right ventricular systolic function is normal. The right ventricular size is normal.  3. The mitral valve is normal in structure. No evidence of mitral valve regurgitation. No evidence of mitral stenosis.  4. The aortic valve is tricuspid. Aortic valve regurgitation is not visualized. No aortic stenosis is present.  5. Aortic dilatation noted. There is mild dilatation of the aortic root, measuring 39  mm.  6. The inferior vena cava is dilated in size with >50% respiratory variability, suggesting right atrial pressure of 8 mmHg. FINDINGS  Left Ventricle: Left ventricular ejection fraction, by estimation, is 55 to 60%. The left ventricle has normal function. The left ventricle has no regional wall motion abnormalities. The left ventricular internal cavity size was normal in size. There is  moderate concentric left ventricular hypertrophy. Left ventricular diastolic parameters were normal. Indeterminate filling pressures. Right Ventricle: The right ventricular size is normal. No increase in right ventricular wall thickness. Right ventricular systolic function is normal. Left Atrium: Left atrial size was normal in size. Right Atrium: Right atrial size was normal in size. Pericardium: There is no evidence of pericardial effusion. Mitral Valve: The mitral valve is normal in structure. No evidence of mitral valve regurgitation. No evidence of mitral valve stenosis. Tricuspid Valve: The tricuspid valve is normal in structure. Tricuspid valve regurgitation is not demonstrated. No evidence of tricuspid stenosis. Aortic Valve: The aortic valve is tricuspid. Aortic valve regurgitation is not visualized. No aortic stenosis is present. Pulmonic Valve: The pulmonic valve was normal in structure. Pulmonic valve regurgitation is not visualized. No evidence of pulmonic stenosis. Aorta: Aortic dilatation noted. There is mild dilatation of the aortic root, measuring 39 mm. Venous: The inferior vena cava is dilated in size with greater than 50% respiratory variability, suggesting right atrial pressure of 8 mmHg. IAS/Shunts: No atrial level shunt detected by color flow Doppler.  LEFT VENTRICLE PLAX 2D LVIDd:         4.70 cm      Diastology LVIDs:         3.30 cm      LV e' medial:    12.80 cm/s LV PW:         1.50 cm      LV E/e' medial:  9.8 LV IVS:        1.50 cm      LV e' lateral:   11.60 cm/s LVOT diam:     2.20 cm      LV E/e'  lateral: 10.8 LV SV:         67 LV SV Index:   29 LVOT Area:     3.80 cm  LV Volumes (MOD) LV vol d, MOD A2C: 117.0 ml LV vol d, MOD A4C: 118.0 ml LV vol s, MOD A2C: 41.9 ml LV vol s,  MOD A4C: 47.2 ml LV SV MOD A2C:     75.1 ml LV SV MOD A4C:     118.0 ml LV SV MOD BP:      74.2 ml RIGHT VENTRICLE             IVC RV Basal diam:  3.20 cm     IVC diam: 2.10 cm RV S prime:     19.70 cm/s TAPSE (M-mode): 3.1 cm LEFT ATRIUM             Index        RIGHT ATRIUM           Index LA diam:        3.00 cm 1.32 cm/m   RA Area:     18.90 cm LA Vol (A2C):   67.1 ml 29.57 ml/m  RA Volume:   45.20 ml  19.92 ml/m LA Vol (A4C):   51.0 ml 22.47 ml/m LA Biplane Vol: 61.1 ml 26.92 ml/m  AORTIC VALVE LVOT Vmax:   93.20 cm/s LVOT Vmean:  64.600 cm/s LVOT VTI:    0.176 m  AORTA Ao Root diam: 3.90 cm Ao Asc diam:  3.30 cm MITRAL VALVE MV Area (PHT): 6.37 cm     SHUNTS MV Decel Time: 119 msec     Systemic VTI:  0.18 m MV E velocity: 125.00 cm/s  Systemic Diam: 2.20 cm MV A velocity: 156.00 cm/s MV E/A ratio:  0.80 Skeet Latch MD Electronically signed by Skeet Latch MD Signature Date/Time: 12/25/2022/6:25:32 PM    Final    IR Perc Cholecystostomy  Result Date: 12/25/2022 INDICATION: Acute cholecystitis EXAM: ULTRASOUND AND FLUOROSCOPIC-GUIDED CHOLECYSTOSTOMY TUBE PLACEMENT COMPARISON:  US Abdomen, 12/24/2022.  CT AP 12/20/2022. MEDICATIONS: 2 g Ancef IV. Antibiotics were administered within an appropriate time frame prior to skin puncture. ANESTHESIA/SEDATION: Moderate (conscious) sedation was employed during this procedure. A total of Versed 2 mg and Fentanyl 100 mcg was administered intravenously. Moderate Sedation Time: 14 minutes. The patient's level of consciousness and vital signs were monitored continuously by radiology nursing throughout the procedure under my direct supervision. CONTRAST:  30m OMNIPAQUE IOHEXOL 300 MG/ML SOLN - administered into the gallbladder fossa. FLUOROSCOPY TIME:  Fluoroscopic dose; 10  mGy COMPLICATIONS: None immediate. PROCEDURE: Informed written consent was obtained from the patient after a discussion of the risks, benefits and alternatives to treatment. Questions regarding the procedure were encouraged and answered. A timeout was performed prior to the initiation of the procedure. The right upper abdominal quadrant was prepped and draped in the usual sterile fashion, and a sterile drape was applied covering the operative field. Maximum barrier sterile technique with sterile gowns and gloves were used for the procedure. A timeout was performed prior to the initiation of the procedure. Local anesthesia was provided with 1% lidocaine with epinephrine. Ultrasound scanning of the right upper quadrant demonstrates a markedly dilated gallbladder. Utilizing a transhepatic approach, a 22 gauge needle was advanced into the gallbladder under direct ultrasound guidance. An ultrasound image was saved for documentation purposes. Appropriate intraluminal puncture was confirmed with the efflux of bile and advancement of an 0.018 wire into the gallbladder lumen. The needle was exchanged for an AMelvilleset. A small amount of contrast was injected to confirm appropriate intraluminal positioning. Over a Benson wire, a 1472-French Cook cholecystomy tube was advanced into the gallbladder fossa, coiled and locked. Bile was aspirated and a small amount of contrast was injected as several post procedural spot radiographic images were obtained in various  obliquities. The catheter was secured to the skin with suture, connected to a drainage bag and a dressing was placed. The patient tolerated the procedure well without immediate post procedural complication. IMPRESSION: 1. Successful placement of a 10 Fr percutaneous cholecystostomy drainage catheter, as above. 2. 80 mL of bilious drainage was aspirated. A sample was submitted to microbiology for analysis. PLAN: The patient will return to Vascular Interventional  Radiology (VIR) for routine drainage catheter evaluation and exchange in 6-8 weeks. Michaelle Birks, MD [facility] Electronically Signed   By: Michaelle Birks M.D.   On: 12/25/2022 16:24   CT RENAL STONE STUDY  Result Date: 12/25/2022 CLINICAL DATA:  Flank pain EXAM: CT ABDOMEN AND PELVIS WITHOUT CONTRAST TECHNIQUE: Multidetector CT imaging of the abdomen and pelvis was performed following the standard protocol without IV contrast. RADIATION DOSE REDUCTION: This exam was performed according to the departmental dose-optimization program which includes automated exposure control, adjustment of the mA and/or kV according to patient size and/or use of iterative reconstruction technique. COMPARISON:  Ultrasound 12/24/2022.  CT 12/20/2022 and older FINDINGS: Lower chest: There is some linear opacity lung bases likely scar or atelectasis. No pleural effusion. Coronary artery calcifications are seen. Please correlate for other coronary risk factors. Hepatobiliary: On this non IV contrast exam, the liver is grossly preserved. However there is placement of a cholecystostomy tube. Within the location of the gallbladder the gallbladder is thickened with sludge or stones and adjacent stranding. There is some ill-defined fluid as well with some ascites along the margin of the liver. Small loculated area posterior to the gallbladder is seen as well on series 2, image 44 measuring 4.4 x 3.3 cm. This could represent sequela of recent intervention. Adjacent stranding as well. Pancreas: Global mild pancreatic atrophy, unchanged from previous. Spleen: Spleen is nonenlarged.  Small splenule. Adrenals/Urinary Tract: Adrenal glands are preserved. Foley catheter within the collapsed urinary bladder. No abnormal calcifications seen in the left kidney nor along the course of the left ureter. Mild ectasia of the right renal collecting system and ureter, nonspecific. No obstructing renal  stones. The calcification seen in the right hemipelvis are likely vascular based on the prior examination. Increasing perinephric stranding on the right. Stomach/Bowel: Stomach is collapsed. There is some wall thickening along the second portion of the duodenal related to the adjacent inflammatory process. No obstruction. The small bowel otherwise is nondilated on this non oral contrast exam. Large bowel has scattered debris in stool. No obstruction. Redundant course to the sigmoid colon. Vascular/Lymphatic: Normal caliber aorta and IVC with scattered vascular calcifications. There are several prominent but less than 1 cm in size nodes identified in the retroperitoneum and upper abdomen, not pathologic by size criteria. Reproductive: Uterus and bilateral adnexa are unremarkable. Other: Scattered ascites identified in the abdomen and pelvis. This is new from prior. Musculoskeletal: Mild degenerative changes along the spine and pelvis. There is some sclerosis identified along both femoral heads. Please correlate for evidence of AVN. IMPRESSION: Interval placement of a percutaneous cholecystostomy tube. The gallbladder is collapsed with wall thickening. There is surrounding mild ascites and ill-defined fluid. Significant stranding tracking along the right anterior perinephric space. Some free fluid in the pelvis as well. Mild ectasia of the right renal collecting system compared to previous without obstructing stone. Etiology is uncertain. Please correlate with patient's renal function and follow up. A contrast study may be of some benefit when clinically appropriate. Bladder is collapsed with Foley catheter. Sclerotic areas along  the femoral heads bilaterally. Please correlate for any history of AVN. Electronically Signed   By: Jill Side M.D.   On: 12/25/2022 16:06    Anti-infectives: Anti-infectives (From admission, onward)    Start     Dose/Rate Route Frequency Ordered Stop   12/27/22 0830  levofloxacin  (LEVAQUIN) IVPB 750 mg        750 mg 100 mL/hr over 90 Minutes Intravenous Every 24 hours 12/27/22 0723     12/27/22 0815  levofloxacin (LEVAQUIN) IVPB 750 mg  Status:  Discontinued        750 mg 100 mL/hr over 90 Minutes Intravenous Daily with breakfast 12/27/22 0720 12/27/22 0723   12/25/22 1500  levofloxacin (LEVAQUIN) IVPB 750 mg  Status:  Discontinued        750 mg 100 mL/hr over 90 Minutes Intravenous Every 48 hours 12/25/22 1326 12/27/22 0720   12/25/22 1427  ceFAZolin (ANCEF) IVPB 2g/100 mL premix        over 30 Minutes Intravenous Continuous PRN 12/25/22 1429 12/25/22 1427   12/25/22 1427  ceFAZolin (ANCEF) 2-4 GM/100ML-% IVPB       Note to Pharmacy: Tressie Stalker R: cabinet override      12/25/22 1427 12/26/22 0244   12/25/22 1415  metroNIDAZOLE (FLAGYL) IVPB 500 mg        500 mg 100 mL/hr over 60 Minutes Intravenous Every 12 hours 12/25/22 1326          Assessment/Plan  Acute cholecystitis  - s/p perc chole by IR 1/22. Culture NGTD - WBC up to 21.4, afebrile - episode of emesis after ensure. Will leave soft diet for now but advised patient to order FLD this am - plan for outpatient follow up IR and surgical follow up for discussion of interval lap chole in 6-8 weeks  BRBPR - hgb stable at 8.7 s/p 1 unit prbc OSH and 1 u here 1/22 - no notable bloody BM during admission however NT noted some bright red blood with pericare 1/23   FEN: soft ID: [email or web address]/flagyl VTE: okay for chemical prophylaxis from surgical perspective   Per primary: MS Paraplegia AKI - Cr 1.13 BRBPR - 1 episode at OSH UTI Bacteremia - repeat [place] pending    I reviewed Consultant IR, ID notes, hospitalist notes, last 24 h vitals and pain scores, last 48 h intake and output, last 24 h labs and trends, and last 24 h imaging results.    LOS: 3 days   [facility] 12/27/2022, 9:41 AM Please see Amion for pager number during day hours 7:00am-4:30pm

## 2022-12-28 ENCOUNTER — Other Ambulatory Visit (HOSPITAL_COMMUNITY): Payer: Self-pay

## 2022-12-28 DIAGNOSIS — R7881 Bacteremia: Secondary | ICD-10-CM | POA: Insufficient documentation

## 2022-12-28 DIAGNOSIS — K810 Acute cholecystitis: Secondary | ICD-10-CM | POA: Diagnosis not present

## 2022-12-28 DIAGNOSIS — K8309 Other cholangitis: Secondary | ICD-10-CM

## 2022-12-28 DIAGNOSIS — N179 Acute kidney failure, unspecified: Secondary | ICD-10-CM | POA: Insufficient documentation

## 2022-12-28 LAB — COMPREHENSIVE METABOLIC PANEL
ALT: 13 U/L (ref 0–44)
AST: 8 U/L — ABNORMAL LOW (ref 15–41)
Albumin: 2.2 g/dL — ABNORMAL LOW (ref 3.5–5.0)
Alkaline Phosphatase: 123 U/L (ref 38–126)
Anion gap: 10 (ref 5–15)
BUN: 9 mg/dL (ref 6–20)
CO2: 22 mmol/L (ref 22–32)
Calcium: 7.9 mg/dL — ABNORMAL LOW (ref 8.9–10.3)
Chloride: 104 mmol/L (ref 98–111)
Creatinine, Ser: 0.83 mg/dL (ref 0.44–1.00)
GFR, Estimated: >60 mL/min (ref >60)
Glucose, Bld: 107 mg/dL — ABNORMAL HIGH (ref 70–99)
Potassium: 3.2 mmol/L — ABNORMAL LOW (ref 3.5–5.1)
Sodium: 136 mmol/L (ref 135–145)
Total Bilirubin: 0.4 mg/dL (ref 0.3–1.2)
Total Protein: 5.9 g/dL — ABNORMAL LOW (ref 6.5–8.1)

## 2022-12-28 LAB — CBC
HCT: 26.6 % — ABNORMAL LOW (ref 36.0–46.0)
Hemoglobin: 8.2 g/dL — ABNORMAL LOW (ref 12.0–15.0)
MCH: 21.2 pg — ABNORMAL LOW (ref 26.0–34.0)
MCHC: 30.8 g/dL (ref 30.0–36.0)
MCV: 68.9 fL — ABNORMAL LOW (ref 80.0–100.0)
Platelets: 392 10*3/uL (ref 150–400)
RBC: 3.86 MIL/uL — ABNORMAL LOW (ref 3.87–5.11)
RDW: 25.5 % — ABNORMAL HIGH (ref 11.5–15.5)
WBC: 21.4 10*3/uL — ABNORMAL HIGH (ref 4.0–10.5)
nRBC: 0 % (ref 0.0–0.2)

## 2022-12-28 LAB — GLUCOSE, CAPILLARY: Glucose-Capillary: 116 mg/dL — ABNORMAL HIGH (ref 70–99)

## 2022-12-28 MED ORDER — POTASSIUM CHLORIDE CRYS ER 20 MEQ PO TBCR
40.0000 meq | EXTENDED_RELEASE_TABLET | Freq: Once | ORAL | Status: AC
  Administered 2022-12-29: 40 meq via ORAL
  Filled 2022-12-28: qty 2

## 2022-12-28 MED ORDER — DOCUSATE SODIUM 100 MG PO CAPS
100.0000 mg | ORAL_CAPSULE | Freq: Two times a day (BID) | ORAL | Status: DC
  Administered 2022-12-28 – 2022-12-31 (×7): 100 mg via ORAL
  Filled 2022-12-28 (×7): qty 1

## 2022-12-28 MED ORDER — CHLORTHALIDONE 25 MG PO TABS
25.0000 mg | ORAL_TABLET | Freq: Every day | ORAL | Status: DC
  Administered 2022-12-28 – 2022-12-31 (×4): 25 mg via ORAL
  Filled 2022-12-28 (×4): qty 1

## 2022-12-28 MED ORDER — MAGNESIUM SULFATE 2 GM/50ML IV SOLN
2.0000 g | Freq: Once | INTRAVENOUS | Status: AC
  Administered 2022-12-28: 2 g via INTRAVENOUS
  Filled 2022-12-28: qty 50

## 2022-12-28 MED ORDER — POTASSIUM CHLORIDE 20 MEQ PO PACK
40.0000 meq | PACK | Freq: Two times a day (BID) | ORAL | Status: AC
  Administered 2022-12-28: 40 meq via ORAL
  Filled 2022-12-28 (×2): qty 2

## 2022-12-28 MED ORDER — AMLODIPINE BESYLATE 5 MG PO TABS
5.0000 mg | ORAL_TABLET | Freq: Every day | ORAL | Status: DC
  Administered 2022-12-28 – 2022-12-29 (×2): 5 mg via ORAL
  Filled 2022-12-28 (×2): qty 1

## 2022-12-28 MED ORDER — POTASSIUM CHLORIDE 2 MEQ/ML IV SOLN
INTRAVENOUS | Status: AC
  Filled 2022-12-28: qty 1000

## 2022-12-28 MED ORDER — POTASSIUM CHLORIDE IN NACL 40-0.9 MEQ/L-% IV SOLN
INTRAVENOUS | Status: DC
  Filled 2022-12-28 (×3): qty 1000

## 2022-12-28 MED ORDER — METRONIDAZOLE 500 MG PO TABS
500.0000 mg | ORAL_TABLET | Freq: Two times a day (BID) | ORAL | 0 refills | Status: DC
  Filled 2022-12-28: qty 56, 28d supply, fill #0

## 2022-12-28 MED ORDER — SODIUM CHLORIDE 0.9% FLUSH
5.0000 mL | Freq: Every day | INTRAVENOUS | 0 refills | Status: AC
  Filled 2022-12-28: qty 300, 30d supply, fill #0

## 2022-12-28 MED ORDER — LEVOFLOXACIN 750 MG PO TABS
750.0000 mg | ORAL_TABLET | Freq: Every day | ORAL | 0 refills | Status: DC
  Filled 2022-12-28: qty 23, 23d supply, fill #0
  Filled 2022-12-28: qty 5, 5d supply, fill #0
  Filled 2022-12-29: qty 28, 28d supply, fill #0

## 2022-12-28 MED ORDER — HYDRALAZINE HCL 10 MG PO TABS: 10.0000 mg | ORAL_TABLET | Freq: Four times a day (QID) | ORAL | Status: DC | PRN

## 2022-12-28 MED ORDER — MAGNESIUM OXIDE -MG SUPPLEMENT 400 (240 MG) MG PO TABS
400.0000 mg | ORAL_TABLET | Freq: Two times a day (BID) | ORAL | Status: AC
  Administered 2022-12-28 (×2): 400 mg via ORAL
  Filled 2022-12-28 (×2): qty 1

## 2022-12-28 NOTE — Progress Notes (Signed)
Pharmacy note - antibiotics transition of care  Prescriptions for levofloxacin '750mg'$  po daily #28 and metronidazole '500mg'$  BID #56 sent to [facility].  Should be delivered today to the patient's bedside.  Patient instructed that exact length of antibiotic therapy to be determined on her clinical improvement at her follow-up ID clinic visit.  Heide Guile, PharmD, [place] Clinical Pharmacist Phone [phone]

## 2022-12-28 NOTE — Progress Notes (Signed)
Subjective/Chief Complaint: Patient complains of nausea but no vomiting. She reports an episode of right scapular pain last night that subsided with pain meds. Today denies pain, just some generalized soreness. Also denies blood per rectum. States she would like to be able to eat and go home.    Objective: Vital signs in last 24 hours: Temp:  [97.9 F (36.6 C)-98.5 F (36.9 C)] 98.5 F (36.9 C) (01/25 0407) Pulse Rate:  [77-93] 89 (01/25 0407) Resp:  [18-20] 18 (01/25 0407) BP: (157-180)/(91-99) 180/99 (01/25 0407) SpO2:  [96 %-100 %] 96 % (01/25 0407) Weight:  [108.7 kg] 108.7 kg (01/25 0407) Last BM Date : 12/24/22  Intake/Output from previous day: 01/24 0701 - 01/25 0700 In: 2447.7 [P.O.:840; I.V.:1399.2; IV Piggyback:198.5] Out: 4098 [Urine:3000; Drains:875] Intake/Output this shift: No intake/output data recorded.  General appearance: alert and no distress Head: Normocephalic, without obvious abnormality, atraumatic Eyes: EOM intact Neck: Neck supple Resp: Respiratory effort normal Cardio: regular rate and rhythm GI: Abdomen soft, ND, tenderness to palpation of epigastrium and around perc chole which is draining bilious fluid  Skin: Skin color, texture, turgor normal. No rashes or lesions Neurologic: Grossly normal Incision/Wound: CDI, bilious drain output   Lab Results:  Recent Labs    12/27/22 0415 12/28/22 0240  WBC 21.4* 21.4*  HGB 8.7* 8.2*  HCT 28.0* 26.6*  PLT 382 392   BMET Recent Labs    12/27/22 0415 12/28/22 0240  NA 136 136  K 3.2* 3.2*  CL 106 104  CO2 20* 22  GLUCOSE 139* 107*  BUN 17 9  CREATININE 1.13* 0.83  CALCIUM 8.2* 7.9*   PT/INR No results for input(s): "LABPROT", "INR" in the last 72 hours. ABG No results for input(s): "PHART", "HCO3" in the last 72 hours.  Invalid input(s): "PCO2", "PO2"  Studies/Results: CT ABDOMEN PELVIS WO CONTRAST  Result Date: 12/27/2022 CLINICAL DATA:  Right lower quadrant abdominal pain  EXAM: CT ABDOMEN AND PELVIS WITHOUT CONTRAST TECHNIQUE: Multidetector CT imaging of the abdomen and pelvis was performed following the standard protocol without IV contrast. RADIATION DOSE REDUCTION: This exam was performed according to the departmental dose-optimization program which includes automated exposure control, adjustment of the mA and/or kV according to patient size and/or use of iterative reconstruction technique. COMPARISON:  12/25/2022 FINDINGS: Lower chest: Trace bilateral pleural effusions and adjacent lower lobe atelectasis. Mild cardiomegaly. Hepatobiliary: Percutaneous cholecystostomy tube in place. Trace amount of gas in the decompressed but thick-walled gallbladder. Surrounding inflammatory findings and ascites similar to previous. Pancreas: Unremarkable Spleen: Unremarkable Adrenals/Urinary Tract: Borderline fullness of the right renal collecting system as on the prior exam, without hydroureter. There is a Foley catheter in the nearly empty urinary bladder. No urinary tract calculi observed. Stomach/Bowel: The fluid and inflammatory findings in the vicinity of the gallbladder also tracks around the descending duodenum, without a definite and duodenal abnormality. Contrast medium in the small bowel and colon. No dilated bowel. Vascular/Lymphatic: Mild aortoiliac atherosclerotic vascular disease. Scattered small lymph nodes along the porta hepatis and retroperitoneum are likely reactive. Reproductive: Unremarkable Other: Subcutaneous and flank edema. Continued mesenteric edema along with perihepatic ascites and ascites tracking along the right paracolic gutter. Small to moderate amount of pelvic ascites similar to prior. Trace presacral edema. Subcutaneous edema extends down along the pubis, mildly increased from prior. 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 No collapse or flattening. Lower lumbar spondylosis and degenerative disc disease.  IMPRESSION: 1. Percutaneous cholecystostomy tube in place. The  gallbladder is decompressed but thick-walled with surrounding fluid and inflammatory findings and ascites similar to prior. 2. Continued mesenteric edema along with perihepatic ascites and ascites tracking along the right paracolic gutter. 3. Trace bilateral pleural effusions and adjacent lower lobe atelectasis. 4. Mild cardiomegaly. 5. 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 6. Chronic borderline fullness of the right renal collecting system without hydroureter. 7. Aortic atherosclerosis. Aortic Atherosclerosis (ICD10-I70.0). Electronically Signed   By: Van Clines M.D.   On: 12/27/2022 16:44    Anti-infectives: Anti-infectives (From admission, onward)    Start     Dose/Rate Route Frequency Ordered Stop   12/28/22 1000  levofloxacin (LEVAQUIN) tablet 750 mg        750 mg Oral Daily 12/27/22 0942     12/27/22 1800  metroNIDAZOLE (FLAGYL) tablet 500 mg  Status:  Discontinued        500 mg Oral Every 12 hours 12/27/22 0942 12/27/22 1656   12/27/22 1800  metroNIDAZOLE (FLAGYL) IVPB 500 mg        500 mg 100 mL/hr over 60 Minutes Intravenous Every 8 hours 12/27/22 1656     12/27/22 0830  levofloxacin (LEVAQUIN) IVPB 750 mg        750 mg 100 mL/hr over 90 Minutes Intravenous Every 24 hours 12/27/22 0723 12/27/22 1950   12/27/22 0815  levofloxacin (LEVAQUIN) IVPB 750 mg  Status:  Discontinued        750 mg 100 mL/hr over 90 Minutes Intravenous Daily with breakfast 12/27/22 0720 12/27/22 0723   12/25/22 1500  levofloxacin (LEVAQUIN) IVPB 750 mg  Status:  Discontinued        750 mg 100 mL/hr over 90 Minutes Intravenous Every 48 hours 12/25/22 1326 12/27/22 0720   12/25/22 1427  ceFAZolin (ANCEF) IVPB 2g/100 mL premix        over 30 Minutes Intravenous Continuous PRN 12/25/22 1429 12/25/22 1427   12/25/22 1427  ceFAZolin (ANCEF) 2-4 GM/100ML-% IVPB       Note to Pharmacy: Tressie Stalker R:  cabinet override      12/25/22 1427 12/26/22 0244   12/25/22 1415  metroNIDAZOLE (FLAGYL) IVPB 500 mg  Status:  Discontinued        500 mg 100 mL/hr over 60 Minutes Intravenous Every 12 hours 12/25/22 1326 12/27/22 0942       Assessment/Plan: Acute cholecystitis  - s/p perc chole by IR 1/22. High bile output with 861m/24h documented - continue to monitor - WBC stable at 21.4, afebrile - plan for outpatient follow up IR and surgical follow up for discussion of interval lap chole in 6-8 weeks   BRBPR - hgb stable at 8.2 s/p 1 unit prbc OSH and 1 u here 1/22 - no notable bloody BM during admission however NT noted some bright red blood with pericare 1/23   FEN: soft, add colace ID: [email or web address] VTE: okay for chemical prophylaxis from surgical perspective    Per primary: MS Paraplegia AKI - Cr 1.13 BRBPR - 1 episode at OSH UTI Bacteremia - repeat [place] pending      I reviewed Consultant IR, ID notes, hospitalist notes, last 24 h vitals and pain scores, last 48 h intake and output, last 24 h labs and trends, and last 24 h imaging results.      LOS: 3 days    ELorenso Quarry PA-S 12/28/2022

## 2022-12-28 NOTE — Progress Notes (Signed)
[facility] PROGRESS NOTE    Progress Note  Debbie [name]  FAO:[id number] DOB: 1976/01/30 DOA: 12/24/2022 PCP: Neale Burly, MD     Brief Narrative:   Debbie [name] is an 47 y.o. 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  Cultures were positive for Salmonella right upper quadrant ultrasound showed acute cholecystitis with a rise in her creatinine and rectal bleeding her hemoglobin dropped from 9-6 she was transfused several units of packed red blood cells.  Transferred to [facility] due to progressive deterioration and need for cholecystectomy.  Blood culture grew Salmonella, general surgery and ID were consulted, she status post percutaneous cholecystectomy placement performed by IR on 12/25/2022, 2D echo showed no endocarditis she was transfused 1 unit of packed red blood cells   Assessment/Plan:   Acute cholecystitis/Salmonella bacteremia: Surveillance blood cultures negative till date.   She is status post percutaneous cholecystectomy drain.   Continues to have large amount of drainage through percutaneous tube about 800 cc. Follow-up with surgery in 6 to 8 weeks for discussion of lap chole She is currently on oral Levaquin  awaiting Salmonella sensitivities from OSH.  Tmax of 98.9 her leukocytosis is rising. CT of the abdomen pelvis was done that showed compression bladder thickening with fluid and inflammatory changes.  Right lower quadrant tenderness CT scan showed no acute findings probably referred pain  Acute kidney injury: With a baseline creatinine of less than 1, in the setting of Mobic ibuprofen ACE inhibitor and chlorthalidone. Renal ultrasound showed right-sided perinephric fluid, CT scan showed no stones. Creatinine improved  to IV fluid hydration.  Hypokalemia Replete orally recheck in the morning.  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  Uncontrolled hypertension: Continue oral metoprolol, add oral Norvasc chedule and hydralazine orally as needed. As her blood pressure continues to be elevated.  Diabetes mellitus type 2 uncontrolled: A1c of 6.0 has not required insulin blood glucose ranging from 1 11-1 49 continue to monitor.   Sacral decubitus ulcer stage I RN Pressure Injury Documentation: Pressure Injury 12/24/22 Sacrum Mid Stage 1 -  Intact skin with non-blanchable redness of a localized area usually over a bony prominence. healing over wound area, skin is white and intact (Active)  12/24/22 1556  Location: Sacrum  Location Orientation: Mid  Staging: Stage 1 -  Intact skin with non-blanchable redness of a localized area usually over a bony prominence.  Wound Description (Comments): healing over wound area, skin is white and intact  Present on Admission: Yes  Dressing Type None 12/27/22 2000    DVT prophylaxis: lovenox Family Communication:none Status is: Inpatient Remains inpatient appropriate because: Salmonella bacteremia    Code Status:     Code Status Orders  (From admission, onward)           Start     Ordered   12/24/22 1621  Full code  Continuous       Question:  By:  Answer:  Consent: discussion documented in EHR   12/24/22 1627           Code Status History     Date Active Date Inactive Code Status Order ID Comments User Context   11/26/2013 1819 12/01/2013 1922 Full Code [id number]  Thurnell Lose, MD ED   11/18/2012  1529 12/06/2012 1336 Full Code [id number]  Mliss Sax, RN Inpatient   11/12/2012 0827 11/18/2012 1529 Full Code [id number]  Noelle Penner, RN Inpatient   11/08/2012 0010 11/12/2012 0827 Full Code [id number]  Theodis Blaze, MD ED         IV Access:   Peripheral IV   Procedures and  diagnostic studies:   CT ABDOMEN PELVIS WO CONTRAST  Result Date: 12/27/2022 CLINICAL DATA:  Right lower quadrant abdominal pain EXAM: CT ABDOMEN AND PELVIS WITHOUT CONTRAST TECHNIQUE: Multidetector CT imaging of the abdomen and pelvis was performed following the standard protocol without IV contrast. RADIATION DOSE REDUCTION: This exam was performed according to the departmental dose-optimization program which includes automated exposure control, adjustment of the mA and/or kV according to patient size and/or use of iterative reconstruction technique. COMPARISON:  12/25/2022 FINDINGS: Lower chest: Trace bilateral pleural effusions and adjacent lower lobe atelectasis. Mild cardiomegaly. Hepatobiliary: Percutaneous cholecystostomy tube in place. Trace amount of gas in the decompressed but thick-walled gallbladder. Surrounding inflammatory findings and ascites similar to previous. Pancreas: Unremarkable Spleen: Unremarkable Adrenals/Urinary Tract: Borderline fullness of the right renal collecting system as on the prior exam, without hydroureter. There is a Foley catheter in the nearly empty urinary bladder. No urinary tract calculi observed. Stomach/Bowel: The fluid and inflammatory findings in the vicinity of the gallbladder also tracks around the descending duodenum, without a definite and duodenal abnormality. Contrast medium in the small bowel and colon. No dilated bowel. Vascular/Lymphatic: Mild aortoiliac atherosclerotic vascular disease. Scattered small lymph nodes along the porta hepatis and retroperitoneum are likely reactive. Reproductive: Unremarkable Other: Subcutaneous and flank edema. Continued mesenteric edema along with perihepatic ascites and ascites tracking along the right paracolic gutter. Small to moderate amount of pelvic ascites similar to prior. Trace presacral edema. Subcutaneous edema extends down along the pubis, mildly increased from prior. 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 No collapse or flattening. Lower lumbar spondylosis and degenerative disc disease. IMPRESSION: 1. Percutaneous cholecystostomy tube in place. The gallbladder is decompressed but thick-walled with surrounding fluid and inflammatory findings and ascites similar to prior. 2. Continued mesenteric edema along with perihepatic ascites and ascites tracking along the right paracolic gutter. 3. Trace bilateral pleural effusions and adjacent lower lobe atelectasis. 4. Mild cardiomegaly. 5. 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 6. Chronic borderline fullness of the right renal collecting system without hydroureter. 7. Aortic atherosclerosis. Aortic Atherosclerosis (ICD10-I70.0). Electronically Signed   By: Van Clines M.D.   On: 12/27/2022 16:44     Medical Consultants:   None.   Subjective:    [name] tolerating her diet no further nausea or vomiting.  Objective:    Vitals:   12/26/22 2259 12/27/22 1244 12/27/22 2004 12/28/22 0407  BP: 105/65 (!) 157/91  (!) 180/99  Pulse: 74 77 93 89  Resp:  '20 18 18  '$ Temp: 98 F (36.7 C) 98.4 F (36.9 C) 97.9 F (36.6 C) 98.5 F (36.9 C)  TempSrc: Oral Oral Oral Oral  SpO2: 98% 100% 98% 96%  Weight:    108.7 kg  Height:       SpO2: 96 % O2 Flow Rate (L/min): 2 L/min   Intake/Output Summary (Last 24 hours) at 12/28/2022 0915 Last data filed at 12/28/2022 0600 Gross per 24 hour  Intake 2447.67 ml  Output 3625 ml  Net -1177.33 ml    Filed Weights   12/25/22 0417 12/26/22 0348 12/28/22  0407  Weight: 114.6 kg 113.1 kg 108.7 kg    Exam: General exam: In no acute distress. Respiratory system: Good air movement and clear to auscultation. Cardiovascular system: S1 & S2 heard, RRR. No JVD. Gastrointestinal system: Abdomen is nondistended, soft and right lower quadrant tenderness Extremities: No pedal edema. Skin: No rashes, lesions or  ulcers Psychiatry: Judgement and insight appear normal. Mood & affect appropriate.   Data Reviewed:    Labs: Basic Metabolic Panel: Recent Labs  Lab 12/24/22 2302 12/25/22 0352 12/26/22 0400 12/27/22 0415 12/28/22 0240  NA 135 137 136 136 136  K 3.3* 3.2* 2.9* 3.2* 3.2*  CL 109 110 108 106 104  CO2 17* 20* 20* 20* 22  GLUCOSE 99 110* 111* 139* 107*  BUN 52* 50* 31* 17 9  CREATININE 3.78* 3.48* 2.06* 1.13* 0.83  CALCIUM 8.0* 8.3* 8.1* 8.2* 7.9*  MG  --   --   --  1.1*  --     GFR Estimated Creatinine Clearance: 109.1 mL/min (by C-G formula based on SCr of 0.83 mg/dL). Liver Function Tests: Recent Labs  Lab 12/24/22 2302 12/25/22 0352 12/26/22 0400 12/27/22 0415 12/28/22 0240  AST 14* 12* 11* 13* 8*  ALT 41 36 '24 16 13  '$ ALKPHOS 148* 142* 142* 157* 123  BILITOT 0.4 0.3 0.2* 0.2* 0.4  PROT 6.0* 5.8* 6.0* 6.1* 5.9*  ALBUMIN 2.4* 2.3* 2.3* 2.3* 2.2*    No results for input(s): "LIPASE", "AMYLASE" in the last 168 hours. No results for input(s): "AMMONIA" in the last 168 hours. Coagulation profile Recent Labs  Lab 12/24/22 2302 12/25/22 0352  INR 1.2 1.3*    COVID-19 Labs  No results for input(s): "DDIMER", "FERRITIN", "LDH", "CRP" in the last 72 hours.  No results found for: "SARSCOV2NAA"  CBC: Recent Labs  Lab 12/24/22 2302 12/25/22 0352 12/26/22 0400 12/27/22 0415 12/28/22 0240  WBC 15.6* 15.0* 17.4* 21.4* 21.4*  NEUTROABS 10.6*  --   --   --   --   HGB 7.4* 7.0*  7.0* 8.4* 8.7* 8.2*  HCT 24.8* 23.5* 27.3* 28.0* 26.6*  MCV 65.6* 65.8* 68.8* 68.6* 68.9*  PLT 281 270 334 382 392    Cardiac Enzymes: No results for input(s): "CKTOTAL", "CKMB", "CKMBINDEX", "TROPONINI" in the last 168 hours. BNP (last 3 results) No results for input(s): "PROBNP" in the last 8760 hours. CBG: Recent Labs  Lab 12/25/22 0735 12/26/22 0753 12/27/22 0413 12/27/22 0807 12/28/22 0745  GLUCAP 112* 128* 149* 127* 116*    D-Dimer: No results for input(s):  "DDIMER" in the last 72 hours. Hgb A1c: No results for input(s): "HGBA1C" in the last 72 hours.  Lipid Profile: No results for input(s): "CHOL", "HDL", "LDLCALC", "TRIG", "CHOLHDL", "LDLDIRECT" in the last 72 hours. Thyroid function studies: No results for input(s): "TSH", "T4TOTAL", "T3FREE", "THYROIDAB" in the last 72 hours.  Invalid input(s): "FREET3"  Anemia work up: No results for input(s): "VITAMINB12", "FOLATE", "FERRITIN", "TIBC", "IRON", "RETICCTPCT" in the last 72 hours. Sepsis Labs: Recent Labs  Lab 12/24/22 1924 12/24/22 2302 12/24/22 2302 12/25/22 0352 12/26/22 0400 12/27/22 0415 12/28/22 0240  PROCALCITON  --  2.37  --   --   --   --   --   WBC  --  15.6*   < > 15.0* 17.4* 21.4* 21.4*  LATICACIDVEN 0.9  --   --   --   --   --   --    < > = values in this interval not displayed.  Microbiology Recent Results (from the past 240 hour(s))  Culture, blood (Routine X 2) w Reflex to ID Panel     Status: None (Preliminary result)   Collection Time: 12/24/22  7:24 PM   Specimen: BLOOD  Result Value Ref Range Status   Specimen Description   Final    BLOOD SITE NOT SPECIFIED Performed at [facility] [address] [place], [place] [address]    Special Requests   Final    BOTTLES DRAWN AEROBIC ONLY Blood Culture adequate volume Performed at [facility] [address] [place], [place] [address]    Culture   Final    NO GROWTH 3 DAYS Performed at [facility], [place] [address] [place], [place] [address]    Report Status PENDING  Incomplete  Culture, blood (Routine X 2) w Reflex to ID Panel     Status: None (Preliminary result)   Collection Time: 12/24/22  7:24 PM   Specimen: BLOOD  Result Value Ref Range Status   Specimen Description   Final    BLOOD SITE NOT SPECIFIED Performed at [facility] [address], [place], [place] [address]    Special Requests   Final    BOTTLES DRAWN AEROBIC  ONLY Blood Culture adequate volume Performed at [facility] [address] [place], [place] [address]    Culture   Final    NO GROWTH 3 DAYS Performed at [facility], [place] [address] [place], [place] [address]    Report Status PENDING  Incomplete  Aerobic/Anaerobic Culture w Gram Stain (surgical/deep wound)     Status: None (Preliminary result)   Collection Time: 12/25/22  2:32 PM   Specimen: BILE  Result Value Ref Range Status   Specimen Description   Final    BILE Performed at [facility] [address], [place], [place] [address]    Special Requests   Final    NONE Performed at [facility], [place] [address] [place], [place] [address]    Gram Stain NO WBC SEEN NO ORGANISMS SEEN   Final   Culture   Final    NO GROWTH 2 DAYS Performed at [facility], [place] [address], [place], [place] [address]    Report Status PENDING  Incomplete     Medications:    atorvastatin  20 mg Oral Daily   Chlorhexidine Gluconate Cloth  6 each Topical Daily   docusate sodium  100 mg Oral BID   levofloxacin  750 mg Oral Daily   metoprolol tartrate  12.5 mg Oral BID   pantoprazole  40 mg Oral Daily   sodium chloride flush  5 mL Intracatheter Q8H   tiZANidine  4 mg Oral Q12H   Continuous Infusions:  dextrose 5 % 1,000 mL with potassium chloride 40 mEq infusion 75 mL/hr at 12/28/22 0328   metronidazole Stopped (12/28/22 0242)      LOS: 4 days   Charlynne Cousins  [facility]  12/28/2022, 9:15 AM

## 2022-12-28 NOTE — Progress Notes (Signed)
Patient c/o nausea. Zofran given earlier. MD informed. IVF restarted.

## 2022-12-29 ENCOUNTER — Inpatient Hospital Stay (HOSPITAL_COMMUNITY): Payer: Medicare HMO

## 2022-12-29 ENCOUNTER — Other Ambulatory Visit (HOSPITAL_COMMUNITY): Payer: Self-pay

## 2022-12-29 DIAGNOSIS — K810 Acute cholecystitis: Secondary | ICD-10-CM | POA: Diagnosis not present

## 2022-12-29 DIAGNOSIS — R7881 Bacteremia: Secondary | ICD-10-CM | POA: Diagnosis not present

## 2022-12-29 DIAGNOSIS — K8309 Other cholangitis: Secondary | ICD-10-CM | POA: Diagnosis not present

## 2022-12-29 HISTORY — PX: IR CHOLANGIOGRAM EXISTING TUBE: IMG6040

## 2022-12-29 LAB — BASIC METABOLIC PANEL
Anion gap: 9 (ref 5–15)
BUN: 9 mg/dL (ref 6–20)
CO2: 25 mmol/L (ref 22–32)
Calcium: 7.6 mg/dL — ABNORMAL LOW (ref 8.9–10.3)
Chloride: 103 mmol/L (ref 98–111)
Creatinine, Ser: 0.8 mg/dL (ref 0.44–1.00)
GFR, Estimated: >60 mL/min (ref >60)
Glucose, Bld: 98 mg/dL (ref 70–99)
Potassium: 3.3 mmol/L — ABNORMAL LOW (ref 3.5–5.1)
Sodium: 137 mmol/L (ref 135–145)

## 2022-12-29 LAB — CBC
HCT: 26 % — ABNORMAL LOW (ref 36.0–46.0)
Hemoglobin: 7.9 g/dL — ABNORMAL LOW (ref 12.0–15.0)
MCH: 21.1 pg — ABNORMAL LOW (ref 26.0–34.0)
MCHC: 30.4 g/dL (ref 30.0–36.0)
MCV: 69.5 fL — ABNORMAL LOW (ref 80.0–100.0)
Platelets: 434 10*3/uL — ABNORMAL HIGH (ref 150–400)
RBC: 3.74 MIL/uL — ABNORMAL LOW (ref 3.87–5.11)
RDW: 25.5 % — ABNORMAL HIGH (ref 11.5–15.5)
WBC: 18.4 10*3/uL — ABNORMAL HIGH (ref 4.0–10.5)
nRBC: 0 % (ref 0.0–0.2)

## 2022-12-29 LAB — HEPATIC FUNCTION PANEL
ALT: 11 U/L (ref 0–44)
AST: 14 U/L — ABNORMAL LOW (ref 15–41)
Albumin: 2.3 g/dL — ABNORMAL LOW (ref 3.5–5.0)
Alkaline Phosphatase: 117 U/L (ref 38–126)
Bilirubin, Direct: 0.1 mg/dL (ref 0.0–0.2)
Indirect Bilirubin: 0.2 mg/dL — ABNORMAL LOW (ref 0.3–0.9)
Total Bilirubin: 0.3 mg/dL (ref 0.3–1.2)
Total Protein: 6 g/dL — ABNORMAL LOW (ref 6.5–8.1)

## 2022-12-29 MED ORDER — POTASSIUM CHLORIDE CRYS ER 20 MEQ PO TBCR
40.0000 meq | EXTENDED_RELEASE_TABLET | Freq: Two times a day (BID) | ORAL | Status: AC
  Administered 2022-12-29 (×2): 40 meq via ORAL
  Filled 2022-12-29 (×2): qty 2

## 2022-12-29 MED ORDER — POTASSIUM CHLORIDE 10 MEQ/100ML IV SOLN: 10.0000 meq | INTRAVENOUS | Status: DC

## 2022-12-29 MED ORDER — IOHEXOL 300 MG/ML  SOLN
50.0000 mL | Freq: Once | INTRAMUSCULAR | Status: AC | PRN
  Administered 2022-12-29: 20 mL

## 2022-12-29 MED ORDER — POLYETHYLENE GLYCOL 3350 17 G PO PACK
17.0000 g | PACK | Freq: Every day | ORAL | Status: DC
  Administered 2022-12-29 – 2022-12-31 (×3): 17 g via ORAL
  Filled 2022-12-29 (×3): qty 1

## 2022-12-29 MED ORDER — AMLODIPINE BESYLATE 10 MG PO TABS
10.0000 mg | ORAL_TABLET | Freq: Every day | ORAL | Status: DC
Start: 2022-12-30 — End: 2022-12-31
  Administered 2022-12-30 – 2022-12-31 (×2): 10 mg via ORAL
  Filled 2022-12-29 (×2): qty 1

## 2022-12-29 MED ORDER — HYDRALAZINE HCL 10 MG PO TABS
10.0000 mg | ORAL_TABLET | Freq: Four times a day (QID) | ORAL | Status: DC
  Administered 2022-12-29 – 2022-12-30 (×4): 10 mg via ORAL
  Filled 2022-12-29 (×4): qty 1

## 2022-12-29 MED ORDER — LIDOCAINE HCL 1 % IJ SOLN
INTRAMUSCULAR | Status: AC
  Filled 2022-12-29: qty 20

## 2022-12-29 NOTE — Progress Notes (Signed)
Assumed care of patient from off going nurse. Agree with previous assessment. Introduced self to patient and addressed needs. Call light within reach, bed alarm armed.

## 2022-12-29 NOTE — Procedures (Signed)
Interventional Radiology Procedure:   Indications: High output from cholecystostomy tube  Procedure: Cholangiogram through existing tube  Findings: Tube in gallbladder.  Gallbladder filled with 40 ml of dilute contrast and no filling of the CBD.  Cystic duct appears to be occluded.    Complications: No immediate complications noted.     EBL: Minimal  Plan: Continue with drainage of the cholecystostomy tube.    Thressa Shiffer R. Anselm Pancoast, MD  Pager: [phone]

## 2022-12-29 NOTE — Progress Notes (Signed)
Subjective/Chief Complaint: Patient is having abdominal pain this morning that began around 3 am around the epigastrium and started after assisted movement in bed by staff. She is waiting on her pain med. She is also having some generalized spasms which she states is because she has not gotten her meds yet. No nausea, vomiting, diarrhea. Has had a BM since day of admission. She has tolerated some soft foods but is taking it slow.   Objective: Vital signs in last 24 hours: Temp:  [98 F (36.7 C)-98.9 F (37.2 C)] 98.3 F (36.8 C) (01/26 0631) Pulse Rate:  [87-95] 95 (01/26 0631) Resp:  [18] 18 (01/26 0631) BP: (166-192)/(94-111) 192/111 (01/26 0631) SpO2:  [96 %-98 %] 96 % (01/26 0631) Weight:  [106.1 kg] 106.1 kg (01/26 0631) Last BM Date : 12/24/22  Intake/Output from previous day: 01/25 0701 - 01/26 0700 In: 1719 [P.O.:170; I.V.:1198.9; IV Piggyback:350.1] Out: 4725 [Urine:4150; Drains:575] Intake/Output this shift: No intake/output data recorded.  General appearance: alert, cooperative, and no distress Head: Normocephalic, without obvious abnormality, atraumatic Eyes: conjunctiva clear, EOM intact  Resp: respiratory effort normal, unlabored Cardio: regular rate and rhythm GI: abdominal tenderness to palpation at epigastric region and right abdomen around drain, voluntary guarding to epigastrium and RUQ without s/s of peritonitis. drain is producing bilious drainage 575 cc/24 hours documented Extremities: extremities normal, atraumatic, no cyanosis or edema  Lab Results:  Recent Labs    12/27/22 0415 12/28/22 0240  WBC 21.4* 21.4*  HGB 8.7* 8.2*  HCT 28.0* 26.6*  PLT 382 392   BMET Recent Labs    12/28/22 0240 12/29/22 0244  NA 136 137  K 3.2* 3.3*  CL 104 103  CO2 22 25  GLUCOSE 107* 98  BUN 9 9  CREATININE 0.83 0.80  CALCIUM 7.9* 7.6*   PT/INR No results for input(s): "LABPROT", "INR" in the last 72 hours. ABG No results for input(s): "PHART",  "HCO3" in the last 72 hours.  Invalid input(s): "PCO2", "PO2"  Studies/Results: CT ABDOMEN PELVIS WO CONTRAST  Result Date: 12/27/2022 CLINICAL DATA:  Right lower quadrant abdominal pain EXAM: CT ABDOMEN AND PELVIS WITHOUT CONTRAST TECHNIQUE: Multidetector CT imaging of the abdomen and pelvis was performed following the standard protocol without IV contrast. RADIATION DOSE REDUCTION: This exam was performed according to the departmental dose-optimization program which includes automated exposure control, adjustment of the mA and/or kV according to patient size and/or use of iterative reconstruction technique. COMPARISON:  12/25/2022 FINDINGS: Lower chest: Trace bilateral pleural effusions and adjacent lower lobe atelectasis. Mild cardiomegaly. Hepatobiliary: Percutaneous cholecystostomy tube in place. Trace amount of gas in the decompressed but thick-walled gallbladder. Surrounding inflammatory findings and ascites similar to previous. Pancreas: Unremarkable Spleen: Unremarkable Adrenals/Urinary Tract: Borderline fullness of the right renal collecting system as on the prior exam, without hydroureter. There is a Foley catheter in the nearly empty urinary bladder. No urinary tract calculi observed. Stomach/Bowel: The fluid and inflammatory findings in the vicinity of the gallbladder also tracks around the descending duodenum, without a definite and duodenal abnormality. Contrast medium in the small bowel and colon. No dilated bowel. Vascular/Lymphatic: Mild aortoiliac atherosclerotic vascular disease. Scattered small lymph nodes along the porta hepatis and retroperitoneum are likely reactive. Reproductive: Unremarkable Other: Subcutaneous and flank edema. Continued mesenteric edema along with perihepatic ascites and ascites tracking along the right paracolic gutter. Small to moderate amount of pelvic ascites similar to prior. Trace presacral edema. Subcutaneous edema extends down along the pubis, mildly  increased from prior. Musculoskeletal:  There is evidence of chronic avascular necrosis in both femoral heads. No collapse or flattening. Lower lumbar spondylosis and degenerative disc disease. IMPRESSION: 1. Percutaneous cholecystostomy tube in place. The gallbladder is decompressed but thick-walled with surrounding fluid and inflammatory findings and ascites similar to prior. 2. Continued mesenteric edema along with perihepatic ascites and ascites tracking along the right paracolic gutter. 3. Trace bilateral pleural effusions and adjacent lower lobe atelectasis. 4. Mild cardiomegaly. 5. 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 6. Chronic borderline fullness of the right renal collecting system without hydroureter. 7. Aortic atherosclerosis. Aortic Atherosclerosis (ICD10-I70.0). Electronically Signed   By: Van Clines M.D.   On: 12/27/2022 16:44    Anti-infectives: Anti-infectives (From admission, onward)    Start     Dose/Rate Route Frequency Ordered Stop   12/29/22 0000  levofloxacin (LEVAQUIN) 750 MG tablet        750 mg Oral Daily 12/28/22 1052 01/26/23 2359   12/29/22 0000  metroNIDAZOLE (FLAGYL) 500 MG tablet       Note to Pharmacy: Patient at [facility] room 1411.  Please deliver when ready.  Also has levofloxacin Rx.   500 mg Oral 2 times daily 12/28/22 1052 01/26/23 2359   12/28/22 1000  levofloxacin (LEVAQUIN) tablet 750 mg        750 mg Oral Daily 12/27/22 0942     12/27/22 1800  metroNIDAZOLE (FLAGYL) tablet 500 mg  Status:  Discontinued        500 mg Oral Every 12 hours 12/27/22 0942 12/27/22 1656   12/27/22 1800  metroNIDAZOLE (FLAGYL) IVPB 500 mg        500 mg 100 mL/hr over 60 Minutes Intravenous Every 8 hours 12/27/22 1656     12/27/22 0830  levofloxacin (LEVAQUIN) IVPB 750 mg        750 mg 100 mL/hr over 90 Minutes Intravenous Every 24 hours 12/27/22 0723 12/27/22 1950   12/27/22 0815  levofloxacin (LEVAQUIN) IVPB 750 mg  Status:   Discontinued        750 mg 100 mL/hr over 90 Minutes Intravenous Daily with breakfast 12/27/22 0720 12/27/22 0723   12/25/22 1500  levofloxacin (LEVAQUIN) IVPB 750 mg  Status:  Discontinued        750 mg 100 mL/hr over 90 Minutes Intravenous Every 48 hours 12/25/22 1326 12/27/22 0720   12/25/22 1427  ceFAZolin (ANCEF) IVPB 2g/100 mL premix        over 30 Minutes Intravenous Continuous PRN 12/25/22 1429 12/25/22 1427   12/25/22 1427  ceFAZolin (ANCEF) 2-4 GM/100ML-% IVPB       Note to Pharmacy: Tressie Stalker R: cabinet override      12/25/22 1427 12/26/22 0244   12/25/22 1415  metroNIDAZOLE (FLAGYL) IVPB 500 mg  Status:  Discontinued        500 mg 100 mL/hr over 60 Minutes Intravenous Every 12 hours 12/25/22 1326 12/27/22 0942       Assessment/Plan: Acute cholecystitis  - s/p perc chole by IR 1/22. High bile output documented - continue to monitor - WBC stable at 21.4 yesterday, afebrile. Recheck pending - plan for outpatient follow up IR and surgical follow up for discussion of interval lap chole in 6-8 weeks. Due to high drain output have reached out to IR to discuss possible further evaluation   BRBPR - hgb stable at 8.2 1/25 s/p 1 unit prbc OSH and 1 u here 1/22 - no notable bloody BM during admission however NT noted some  bright red blood with pericare 1/23  FEN: soft, colace, add daily miralax ID: [email or web address] VTE: okay for chemical prophylaxis from surgical perspective    Per primary: MS Paraplegia AKI - improved BRBPR - 1 episode at OSH UTI Bacteremia - repeat [place] pending      I reviewed Consultant , ID notes, hospitalist notes, last 24 h vitals and pain scores, last 48 h intake and output, last 24 h labs and trends, and last 24 h imaging results.  Winferd Humphrey, [facility] Surgery 12/29/2022, 10:45 AM Please see Amion for pager number during day hours 7:00am-4:30pm

## 2022-12-29 NOTE — Progress Notes (Signed)
[facility] PROGRESS NOTE    Progress Note  Debbie [name]  GLO:[id number] DOB: Jan 04, 1976 DOA: 12/24/2022 PCP: Neale Burly, MD     Brief Narrative:   Debbie [name] is an 47 y.o. 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  Cultures were positive for Salmonella right upper quadrant ultrasound showed acute cholecystitis with a rise in her creatinine and rectal bleeding her hemoglobin dropped from 9-6 she was transfused several units of packed red blood cells.  Transferred to [facility] due to progressive deterioration and need for cholecystectomy.  Blood culture grew Salmonella, general surgery and ID were consulted, she status post percutaneous cholecystectomy placement performed by IR on 12/25/2022, 2D echo showed no endocarditis. Surveillance blood cultures on 12/24/2022 negative till date.  Follow-up with surgery in 6 to 8 weeks for discussion of lap chole   Assessment/Plan:   Acute cholecystitis/Salmonella bacteremia: Continues to have large amount of drainage through percutaneous tube about 575cc ID was consulted continue oral Levaquin and IV Flagyl. CT of the abdomen done that showed compression bladder thickening with fluid and inflammatory changes.    Acute kidney injury: With a baseline creatinine of less than 1, in the setting of Mobic ibuprofen ACE inhibitor and chlorthalidone. Renal ultrasound showed right-sided perinephric fluid, CT scan showed no stones. Creatinine improved to IV fluid hydration.  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  Uncontrolled hypertension: Blood pressure continues to be elevated, continue metoprolol  increase Norvasc and add hydralazine. Continue chlorthalidone, KVO IV fluids.  Diabetes mellitus type 2 uncontrolled: A1c of 6.0 has not required insulin blood glucose ranging from 1 11-1 49 continue to monitor.   Sacral decubitus ulcer stage I RN Pressure Injury Documentation: Pressure Injury 12/24/22 Sacrum Mid Stage 1 -  Intact skin with non-blanchable redness of a localized area usually over a bony prominence. healing over wound area, skin is white and intact (Active)  12/24/22 1556  Location: Sacrum  Location Orientation: Mid  Staging: Stage 1 -  Intact skin with non-blanchable redness of a localized area usually over a bony prominence.  Wound Description (Comments): healing over wound area, skin is white and intact  Present on Admission: Yes  Dressing Type None 12/28/22 2239    DVT prophylaxis: lovenox Family Communication:none Status is: Inpatient Remains inpatient appropriate because: Salmonella bacteremia    Code Status:     Code Status Orders  (From admission, onward)           Start     Ordered   12/24/22 1621  Full code  Continuous       Question:  By:  Answer:  Consent: discussion documented in EHR   12/24/22 1627           Code Status History     Date Active Date Inactive Code Status Order ID Comments User Context   11/26/2013 1819 12/01/2013 1922 Full Code [id number]  Thurnell Lose, MD ED   11/18/2012 1529 12/06/2012 1336 Full Code [id number]  Mliss Sax, RN Inpatient   11/12/2012 0827 11/18/2012 1529 Full Code [id number]  Noelle Penner, RN Inpatient   11/08/2012 0010 11/12/2012 0827 Full Code [id number]  Theodis Blaze, MD ED  IV Access:   Peripheral IV   Procedures and diagnostic studies:   CT ABDOMEN PELVIS WO CONTRAST  Result Date: 12/27/2022 CLINICAL DATA:  Right lower quadrant abdominal pain EXAM: CT ABDOMEN AND PELVIS WITHOUT CONTRAST TECHNIQUE: Multidetector CT imaging of the abdomen and pelvis was performed  following the standard protocol without IV contrast. RADIATION DOSE REDUCTION: This exam was performed according to the departmental dose-optimization program which includes automated exposure control, adjustment of the mA and/or kV according to patient size and/or use of iterative reconstruction technique. COMPARISON:  12/25/2022 FINDINGS: Lower chest: Trace bilateral pleural effusions and adjacent lower lobe atelectasis. Mild cardiomegaly. Hepatobiliary: Percutaneous cholecystostomy tube in place. Trace amount of gas in the decompressed but thick-walled gallbladder. Surrounding inflammatory findings and ascites similar to previous. Pancreas: Unremarkable Spleen: Unremarkable Adrenals/Urinary Tract: Borderline fullness of the right renal collecting system as on the prior exam, without hydroureter. There is a Foley catheter in the nearly empty urinary bladder. No urinary tract calculi observed. Stomach/Bowel: The fluid and inflammatory findings in the vicinity of the gallbladder also tracks around the descending duodenum, without a definite and duodenal abnormality. Contrast medium in the small bowel and colon. No dilated bowel. Vascular/Lymphatic: Mild aortoiliac atherosclerotic vascular disease. Scattered small lymph nodes along the porta hepatis and retroperitoneum are likely reactive. Reproductive: Unremarkable Other: Subcutaneous and flank edema. Continued mesenteric edema along with perihepatic ascites and ascites tracking along the right paracolic gutter. Small to moderate amount of pelvic ascites similar to prior. Trace presacral edema. Subcutaneous edema extends down along the pubis, mildly increased from prior. 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 No collapse or flattening. Lower lumbar spondylosis and degenerative disc disease. IMPRESSION: 1. Percutaneous cholecystostomy tube in place. The gallbladder is decompressed but thick-walled with surrounding  fluid and inflammatory findings and ascites similar to prior. 2. Continued mesenteric edema along with perihepatic ascites and ascites tracking along the right paracolic gutter. 3. Trace bilateral pleural effusions and adjacent lower lobe atelectasis. 4. Mild cardiomegaly. 5. 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 6. Chronic borderline fullness of the right renal collecting system without hydroureter. 7. Aortic atherosclerosis. Aortic Atherosclerosis (ICD10-I70.0). Electronically Signed   By: Van Clines M.D.   On: 12/27/2022 16:44     Medical Consultants:   None.   Subjective:    [name] no further vomiting tolerating some diet.  Objective:    Vitals:   12/28/22 0407 12/28/22 1349 12/28/22 2025 12/29/22 0631  BP: (!) 180/99 (!) 166/94 (!) 182/96 (!) 192/111  Pulse: 89 87 91 95  Resp: '18  18 18  '$ Temp: 98.5 F (36.9 C) 98.9 F (37.2 C) 98 F (36.7 C) 98.3 F (36.8 C)  TempSrc: Oral Oral Oral Oral  SpO2: 96%  98% 96%  Weight: 108.7 kg   106.1 kg  Height:       SpO2: 96 % O2 Flow Rate (L/min): 2 L/min   Intake/Output Summary (Last 24 hours) at 12/29/2022 0901 Last data filed at 12/29/2022 0630 Gross per 24 hour  Intake 1719 ml  Output 4725 ml  Net -3006 ml    Filed Weights   12/26/22 0348 12/28/22 0407 12/29/22 0631  Weight: 113.1 kg 108.7 kg 106.1 kg    Exam: General exam: In no acute distress. Respiratory system: Good air movement and clear to auscultation. Cardiovascular system: S1 & S2 heard, RRR. No JVD. Gastrointestinal system: Abdomen is nondistended, soft and nontender.  Extremities: No pedal  edema. Skin: No rashes, lesions or ulcers Psychiatry: Judgement and insight appear normal. Mood & affect appropriate.   Data Reviewed:    Labs: Basic Metabolic Panel: Recent Labs  Lab 12/25/22 0352 12/26/22 0400 12/27/22 0415 12/28/22 0240 12/29/22 0244  NA 137 136 136 136 137  K 3.2* 2.9* 3.2* 3.2*  3.3*  CL 110 108 106 104 103  CO2 20* 20* 20* 22 25  GLUCOSE 110* 111* 139* 107* 98  BUN 50* 31* '17 9 9  '$ CREATININE 3.48* 2.06* 1.13* 0.83 0.80  CALCIUM 8.3* 8.1* 8.2* 7.9* 7.6*  MG  --   --  1.1*  --   --     GFR Estimated Creatinine Clearance: 111.9 mL/min (by C-G formula based on SCr of 0.8 mg/dL). Liver Function Tests: Recent Labs  Lab 12/24/22 2302 12/25/22 0352 12/26/22 0400 12/27/22 0415 12/28/22 0240  AST 14* 12* 11* 13* 8*  ALT 41 36 '24 16 13  '$ ALKPHOS 148* 142* 142* 157* 123  BILITOT 0.4 0.3 0.2* 0.2* 0.4  PROT 6.0* 5.8* 6.0* 6.1* 5.9*  ALBUMIN 2.4* 2.3* 2.3* 2.3* 2.2*    No results for input(s): "LIPASE", "AMYLASE" in the last 168 hours. No results for input(s): "AMMONIA" in the last 168 hours. Coagulation profile Recent Labs  Lab 12/24/22 2302 12/25/22 0352  INR 1.2 1.3*    COVID-19 Labs  No results for input(s): "DDIMER", "FERRITIN", "LDH", "CRP" in the last 72 hours.  No results found for: "SARSCOV2NAA"  CBC: Recent Labs  Lab 12/24/22 2302 12/25/22 0352 12/26/22 0400 12/27/22 0415 12/28/22 0240  WBC 15.6* 15.0* 17.4* 21.4* 21.4*  NEUTROABS 10.6*  --   --   --   --   HGB 7.4* 7.0*  7.0* 8.4* 8.7* 8.2*  HCT 24.8* 23.5* 27.3* 28.0* 26.6*  MCV 65.6* 65.8* 68.8* 68.6* 68.9*  PLT 281 270 334 382 392    Cardiac Enzymes: No results for input(s): "CKTOTAL", "CKMB", "CKMBINDEX", "TROPONINI" in the last 168 hours. BNP (last 3 results) No results for input(s): "PROBNP" in the last 8760 hours. CBG: Recent Labs  Lab 12/25/22 0735 12/26/22 0753 12/27/22 0413 12/27/22 0807 12/28/22 0745  GLUCAP 112* 128* 149* 127* 116*    D-Dimer: No results for input(s): "DDIMER" in the last 72 hours. Hgb A1c: No results for input(s): "HGBA1C" in the last 72 hours.  Lipid Profile: No results for input(s): "CHOL", "HDL", "LDLCALC", "TRIG", "CHOLHDL", "LDLDIRECT" in the last 72 hours. Thyroid function studies: No results for input(s): "TSH", "T4TOTAL",  "T3FREE", "THYROIDAB" in the last 72 hours.  Invalid input(s): "FREET3"  Anemia work up: No results for input(s): "VITAMINB12", "FOLATE", "FERRITIN", "TIBC", "IRON", "RETICCTPCT" in the last 72 hours. Sepsis Labs: Recent Labs  Lab 12/24/22 1924 12/24/22 2302 12/24/22 2302 12/25/22 0352 12/26/22 0400 12/27/22 0415 12/28/22 0240  PROCALCITON  --  2.37  --   --   --   --   --   WBC  --  15.6*   < > 15.0* 17.4* 21.4* 21.4*  LATICACIDVEN 0.9  --   --   --   --   --   --    < > = values in this interval not displayed.    Microbiology Recent Results (from the past 240 hour(s))  Culture, blood (Routine X 2) w Reflex to ID Panel     Status: None (Preliminary result)   Collection Time: 12/24/22  7:24 PM   Specimen: BLOOD  Result Value Ref Range Status   Specimen Description  Final    BLOOD SITE NOT SPECIFIED Performed at [facility], [place] [address], [place], [place] [address]    Special Requests   Final    BOTTLES DRAWN AEROBIC ONLY Blood Culture adequate volume Performed at [place] [address] [place], [place] [address]    Culture   Final    NO GROWTH 3 DAYS Performed at [facility], [place] [address], [place], [place] [address]    Report Status PENDING  Incomplete  Culture, blood (Routine X 2) w Reflex to ID Panel     Status: None (Preliminary result)   Collection Time: 12/24/22  7:24 PM   Specimen: BLOOD  Result Value Ref Range Status   Specimen Description   Final    BLOOD SITE NOT SPECIFIED Performed at [place] [address] [place], [place] [address]    Special Requests   Final    BOTTLES DRAWN AEROBIC ONLY Blood Culture adequate volume Performed at [place] [address] [place], [place] [address]    Culture   Final    NO GROWTH 3 DAYS Performed at [facility], [place] [address], [place], [place] [address]    Report Status PENDING  Incomplete   Aerobic/Anaerobic Culture w Gram Stain (surgical/deep wound)     Status: None (Preliminary result)   Collection Time: 12/25/22  2:32 PM   Specimen: BILE  Result Value Ref Range Status   Specimen Description   Final    BILE Performed at [place] [address] [place], [place] [address]    Special Requests   Final    NONE Performed at [facility], [place] [address], [place], [place] [address]    Gram Stain NO WBC SEEN NO ORGANISMS SEEN   Final   Culture   Final    NO GROWTH 4 DAYS Performed at [facility], [place] [address] [place],  [address]    Report Status PENDING  Incomplete     Medications:    amLODipine  5 mg Oral Daily   atorvastatin  20 mg Oral Daily   Chlorhexidine Gluconate Cloth  6 each Topical Daily   chlorthalidone  25 mg Oral Daily   docusate sodium  100 mg Oral BID   levofloxacin  750 mg Oral Daily   metoprolol tartrate  12.5 mg Oral BID   pantoprazole  40 mg Oral Daily   potassium chloride  40 mEq Oral BID   sodium chloride flush  5 mL Intracatheter Q8H   tiZANidine  4 mg Oral Q12H   Continuous Infusions:  0.9 % NaCl with KCl 40 mEq / L 75 mL/hr at 12/29/22 0314   metronidazole 500 mg (12/29/22 0107)      LOS: 5 days   Charlynne Cousins  [facility]  12/29/2022, 9:01 AM

## 2022-12-30 DIAGNOSIS — K8309 Other cholangitis: Secondary | ICD-10-CM | POA: Diagnosis not present

## 2022-12-30 DIAGNOSIS — R7881 Bacteremia: Secondary | ICD-10-CM | POA: Diagnosis not present

## 2022-12-30 DIAGNOSIS — K810 Acute cholecystitis: Secondary | ICD-10-CM | POA: Diagnosis not present

## 2022-12-30 LAB — CULTURE, BLOOD (ROUTINE X 2)
Culture: NO GROWTH
Culture: NO GROWTH
Special Requests: ADEQUATE
Special Requests: ADEQUATE

## 2022-12-30 LAB — BASIC METABOLIC PANEL
Anion gap: 9 (ref 5–15)
BUN: 8 mg/dL (ref 6–20)
CO2: 25 mmol/L (ref 22–32)
Calcium: 8.1 mg/dL — ABNORMAL LOW (ref 8.9–10.3)
Chloride: 101 mmol/L (ref 98–111)
Creatinine, Ser: 0.72 mg/dL (ref 0.44–1.00)
GFR, Estimated: >60 mL/min (ref >60)
Glucose, Bld: 117 mg/dL — ABNORMAL HIGH (ref 70–99)
Potassium: 3.4 mmol/L — ABNORMAL LOW (ref 3.5–5.1)
Sodium: 135 mmol/L (ref 135–145)

## 2022-12-30 LAB — AEROBIC/ANAEROBIC CULTURE W GRAM STAIN (SURGICAL/DEEP WOUND)
Culture: NO GROWTH
Gram Stain: NONE SEEN

## 2022-12-30 MED ORDER — LISINOPRIL 20 MG PO TABS
40.0000 mg | ORAL_TABLET | Freq: Every day | ORAL | Status: DC
  Administered 2022-12-30 – 2022-12-31 (×2): 40 mg via ORAL
  Filled 2022-12-30 (×2): qty 2

## 2022-12-30 MED ORDER — METRONIDAZOLE 500 MG PO TABS
500.0000 mg | ORAL_TABLET | Freq: Two times a day (BID) | ORAL | Status: DC
  Administered 2022-12-30 – 2022-12-31 (×3): 500 mg via ORAL
  Filled 2022-12-30 (×3): qty 1

## 2022-12-30 MED ORDER — HYDRALAZINE HCL 10 MG PO TABS
10.0000 mg | ORAL_TABLET | Freq: Three times a day (TID) | ORAL | Status: DC
  Administered 2022-12-30 – 2022-12-31 (×4): 10 mg via ORAL
  Filled 2022-12-30 (×4): qty 1

## 2022-12-30 MED ORDER — POTASSIUM CHLORIDE CRYS ER 20 MEQ PO TBCR
40.0000 meq | EXTENDED_RELEASE_TABLET | Freq: Two times a day (BID) | ORAL | Status: AC
  Administered 2022-12-30 (×2): 40 meq via ORAL
  Filled 2022-12-30 (×2): qty 2

## 2022-12-30 MED ORDER — ONDANSETRON 4 MG PO TBDP: 4.0000 mg | ORAL_TABLET | Freq: Three times a day (TID) | ORAL | Status: DC | PRN

## 2022-12-30 NOTE — Evaluation (Signed)
Physical Therapy Evaluation Patient Details Name: Debbie [name] MRN: [id number] DOB: 06-May-1976 Today's Date: 12/30/2022  History of Present Illness  Pt is a 47 y.o. 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  Found to have Salmonella and  acute cholecystitis. Status post percutaneous cholecystectomy placement performed by IR on 12/25/2022.   Clinical Impression  Pt is a 47 y.o. female with above HPI resulting in with generalized weakness, decreased activity tolerance, impaired balance and pain  limiting functional mobility. Pt reports she is independent with transfers to wheelchairs and BSC at baseline with use of slide board. Pt able to progress to EOB slowly-Limited by dizziness and pain- did not progress to performance of transfers today. Currently she is not agreeable to rehab after discharge and unsure if she would agree to [facility] therapy. Pt will benefit from continued skilled PT to maximize functional mobility and increase independence.         Recommendations for follow up therapy are one component of a multi-disciplinary discharge planning process, led by the attending physician.  Recommendations may be updated based on patient status, additional functional criteria and insurance authorization.  Follow Up Recommendations Home health PT (if pt agreeable)      Assistance Recommended at Discharge Frequent or constant Supervision/Assistance  Patient can return home with the following  A little help with walking and/or transfers;Help with stairs or ramp for entrance;Assist for transportation;Assistance with cooking/housework    Equipment Recommendations None recommended by PT  Recommendations for Other Services       Functional Status Assessment Patient has had a  recent decline in their functional status and demonstrates the ability to make significant improvements in function in a reasonable and predictable amount of time.     Precautions / Restrictions Precautions Precautions: Fall Precaution Comments: paraplegic Restrictions Weight Bearing Restrictions: No      Mobility  Bed Mobility Overal bed mobility: Needs Assistance Bed Mobility: Supine to Sit     Supine to sit: Min guard, HOB elevated Sit to supine: Min guard   General bed mobility comments: slow increments to come into long sitting due to reported lightheaded/dizziness and pain upon initial attempts to sit EOB. BP 172/87. Pt with use of UEs to bring B LEs to EOB with heavy use of bed rails. Able to perform lateral scoots x3 toward R for repositioning    Transfers                   General transfer comment: performance of transfers deferred due to pt symptoms with bed mobility- pt reports she has not been OOB/sitting up in past 2 weeks since initial hospitalization and transfer from [place]    Modified Rankin (Stroke Patients Only)       Balance Overall balance assessment: Needs assistance Sitting-balance support: No upper extremity supported Sitting balance-Leahy Scale: Fair                                       Pertinent Vitals/Pain Pain Assessment  Pain Assessment: 0-10 Pain Score: 9  Pain Location: Bilateral Hips Pain Descriptors / Indicators: Aching Pain Intervention(s): Monitored during session    Home Living Family/patient expects to be discharged to:: Private residence Living Arrangements: Spouse/significant other Available Help at Discharge: Family;Available PRN/intermittently Type of Home: Mobile home Home Access: Ramped entrance       Home Layout: One level Home Equipment: BSC/3in1;Wheelchair - power;Wheelchair -  manual;Hospital bed      Prior Function Prior Level of Function : Independent/Modified Independent             Mobility Comments: slide board transfers. electric wheelchair in community, manual in home/shorter distances ADLs Comments: independent from wc level     Hand Dominance   Dominant Hand: Right    Extremity/Trunk Assessment   Upper Extremity Assessment Upper Extremity Assessment: Overall WFL for tasks assessed    Lower Extremity Assessment Lower Extremity Assessment: Defer to PT evaluation RLE Deficits / Details: reports no sensation in R LE at all. (+) clonus LLE Deficits / Details: Pt reports that she has some sensation to deep touch/pressure, but diminshed sensation to light touch. (+) clonus LLE Sensation: decreased light touch    Cervical / Trunk Assessment Cervical / Trunk Assessment: Normal  Communication   Communication: No difficulties  Cognition Arousal/Alertness: Awake/alert Behavior During Therapy: WFL for tasks assessed/performed Overall Cognitive Status: Within Functional Limits for tasks assessed                                          General Comments      Exercises     Assessment/Plan    PT Assessment Patient needs continued PT services  PT Problem List Decreased strength;Decreased activity tolerance;Decreased mobility       PT Treatment Interventions Functional mobility training;Therapeutic activities;Therapeutic exercise;Patient/family education;Balance training;Wheelchair mobility training    PT Goals (Current goals can be found in the Care Plan section)  Acute Rehab PT Goals Patient Stated Goal: Get strength and independence back PT Goal Formulation: With patient Time For Goal Achievement: 01/13/23 Potential to Achieve Goals: Good Additional Goals Additional Goal #1: Pt will propel manual wheelchair 115f independently    Frequency Min 3X/week     Co-evaluation               AM-PAC PT "6  Clicks" Mobility  Outcome Measure Help needed turning from your back to your side while in a flat bed without using bedrails?: A Little Help needed moving from lying on your back to sitting on the side of a flat bed without using bedrails?: A Little Help needed moving to and from a bed to a chair (including a wheelchair)?: A Little Help needed standing up from a chair using your arms (e.g., wheelchair or bedside chair)?: Total Help needed to walk in hospital room?: Total Help needed climbing 3-5 steps with a railing? : Total 6 Click Score: 12    End of Session   Activity Tolerance: Patient limited by pain;Treatment limited secondary to medical complications (Comment) (dizziness and pain limited progression of mobility) Patient left: in bed;with call bell/phone within reach;with bed alarm set;with family/visitor present Nurse Communication: Mobility status PT Visit Diagnosis: Muscle weakness (generalized) (M62.81)    Time: 14818-5631PT Time Calculation (min) (ACUTE ONLY): 19 min   Charges:   PT Evaluation $PT Eval Low Complexity: 1 Low  Festus Barren., PT, DPT  Acute Rehabilitation Services  Office [phone]  12/30/2022, 4:18 PM

## 2022-12-30 NOTE — Evaluation (Signed)
Occupational Therapy Evaluation Patient Details Name: Debbie [name] MRN: [id number] DOB: [birth year] Today's Date: 12/30/2022   History of Present Illness Debbie [name] is an 47 y.o. 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  Found to have Salmonella and  acute cholecystitis. Status post percutaneous cholecystectomy placement performed by IR on 12/25/2022,   Clinical Impression   Mrs. Debbie [name] is a 47 year old woman who presents with generalized weakness, decreased activity tolerance, impaired balance and pain. At baseline she is modified independent and transfers herself via slide board. Today she was able to sit edge of bed - slowly transferred herself using bed rails. She reports dizziness in sitting initially but mor so pain at drain sit. She is not agreeable to rehab after discharge and unsure if she would agree to [facility] therapy. Patient will benefit from skilled OT services while in hospital to improve deficits and learn compensatory strategies as needed in order to return to PLOF.       Recommendations for follow up therapy are one component of a multi-disciplinary discharge planning process, led by the attending physician.  Recommendations may be updated based on patient status, additional functional criteria and insurance authorization.   Follow Up Recommendations  No OT follow up     Assistance Recommended at Discharge Intermittent Supervision/Assistance  Patient can return home with the following A little help with walking and/or transfers;A little help with bathing/dressing/bathroom;Assistance with cooking/housework;Assist for transportation;Help with stairs or ramp for entrance    Functional Status Assessment  Patient has had a recent decline in their  functional status and demonstrates the ability to make significant improvements in function in a reasonable and predictable amount of time.  Equipment Recommendations  None recommended by OT    Recommendations for Other Services       Precautions / Restrictions Precautions Precautions: Fall Precaution Comments: paraplegic Restrictions Weight Bearing Restrictions: No      Mobility Bed Mobility Overal bed mobility: Needs Assistance Bed Mobility: Supine to Sit, Sit to Supine     Supine to sit: Min guard Sit to supine: Min guard   General bed mobility comments: increased time and effort to perform. Dizzy, fatigued and pain in sitting    Transfers                          Balance Overall balance assessment: Needs assistance Sitting-balance support: No upper extremity supported, Feet supported Sitting balance-Leahy Scale: Fair                                     ADL either performed or assessed with clinical judgement   ADL   Eating/Feeding: Independent   Grooming: Set up;Bed level   Upper Body Bathing: Set up;Bed level   Lower Body Bathing: Bed level;Moderate assistance   Upper Body Dressing : Minimal assistance;Sitting   Lower Body Dressing: Bed level;Minimal assistance     Toilet Transfer Details (indicate cue type and reason): unable Toileting- Clothing Manipulation and Hygiene: Total assistance;Bed level               Vision Patient Visual Report: No change from baseline       Perception  Praxis      Pertinent Vitals/Pain Pain Assessment Pain Assessment: 0-10 Pain Score: 9  Pain Location: Bilateral Hips Pain Descriptors / Indicators: Aching Pain Intervention(s): Monitored during session, RN gave pain meds during session     Hand Dominance Right   Extremity/Trunk Assessment Upper Extremity Assessment Upper Extremity Assessment: Overall WFL for tasks assessed   Lower Extremity Assessment Lower Extremity  Assessment: Defer to PT evaluation RLE Deficits / Details: reports no sensation in R LE at all LLE Deficits / Details: Pt reports that she has some sensation to deep touch/pressure, but diminshed sensation to light touch LLE Sensation: decreased light touch   Cervical / Trunk Assessment Cervical / Trunk Assessment: Normal   Communication Communication Communication: No difficulties   Cognition Arousal/Alertness: Awake/alert Behavior During Therapy: WFL for tasks assessed/performed Overall Cognitive Status: Within Functional Limits for tasks assessed                                       General Comments       Exercises     Shoulder Instructions      Home Living Family/patient expects to be discharged to:: Private residence Living Arrangements: Spouse/significant other Available Help at Discharge: Family;Available PRN/intermittently Type of Home: Mobile home Home Access: Ramped entrance     Home Layout: One level     Bathroom Shower/Tub: Teacher, early years/pre: Standard Bathroom Accessibility: No   Home Equipment: BSC/3in1;Wheelchair - power;Wheelchair - manual;Hospital bed          Prior Functioning/Environment Prior Level of Function : Independent/Modified Independent             Mobility Comments: slide board transfers. electric wheelchair in community, manual in home/shorter distances ADLs Comments: independent from wc level        OT Problem List: Decreased strength;Decreased activity tolerance;Impaired balance (sitting and/or standing);Obesity      OT Treatment/Interventions: Self-care/ADL training;Therapeutic exercise;DME and/or AE instruction;Therapeutic activities;Balance training;Patient/family education    OT Goals(Current goals can be found in the care plan section) Acute Rehab OT Goals Patient Stated Goal: transfer to chair OT Goal Formulation: With patient Time For Goal Achievement: 01/13/23 Potential to  Achieve Goals: Good  OT Frequency: Min 2X/week    Co-evaluation              AM-PAC OT "6 Clicks" Daily Activity     Outcome Measure Help from another person eating meals?: None Help from another person taking care of personal grooming?: A Little Help from another person toileting, which includes using toliet, bedpan, or urinal?: Total Help from another person bathing (including washing, rinsing, drying)?: A Lot Help from another person to put on and taking off regular upper body clothing?: A Little Help from another person to put on and taking off regular lower body clothing?: A Little 6 Click Score: 16   End of Session Nurse Communication: Mobility status  Activity Tolerance: Patient limited by fatigue Patient left: in bed;with call bell/phone within reach  OT Visit Diagnosis: Muscle weakness (generalized) (M62.81)                Time: 4782-9562 OT Time Calculation (min): 19 min Charges:  OT General Charges $OT Visit: 1 Visit OT Evaluation $OT Eval Low Complexity: 1 Low  Gustavo Lah, OTR/L [place]  Office [phone]   Lenward Chancellor 12/30/2022, 4:07 PM

## 2022-12-30 NOTE — Progress Notes (Signed)
[facility] PROGRESS NOTE    Progress Note  Debbie [name]  OJJ:[id number] DOB: [birth year] DOA: 12/24/2022 PCP: Neale Burly, MD     Brief Narrative:   Debbie [name] is an 47 y.o. 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  Cultures were positive for Salmonella right upper quadrant ultrasound showed acute cholecystitis with a rise in her creatinine and rectal bleeding her hemoglobin dropped from 9-6 she was transfused several units of packed red blood cells.  Transferred to [facility] due to progressive deterioration and need for cholecystectomy.  Blood culture grew Salmonella, general surgery and ID were consulted, she status post percutaneous cholecystectomy placement performed by IR on 12/25/2022, 2D echo showed no endocarditis. Surveillance blood cultures on 12/24/2022 negative till date.  Follow-up with surgery in 6 to 8 weeks for discussion of lap chole   Assessment/Plan:   Acute cholecystitis/Salmonella bacteremia: drainage through percutaneous tube has improved about 330cc ID was consulted continue oral Levaquin and IV Flagyl.  they recommended to continue continue Flagyl and Levaquin orally prior to discharge plan to complete 2 to 3 weeks of antibiotics starting on 12/20/2022.  Patient has an appointment on 01/08/2023. High output from percutaneous drain IR perform cholangiogram, show no complications. Further management of percutaneous drain as needed surgery.  Acute kidney injury: With a baseline creatinine of less than 1, in the setting of Mobic ibuprofen ACE inhibitor and chlorthalidone. Renal ultrasound showed right-sided perinephric fluid, CT scan showed no stones. Creatinine improved to IV fluid hydration.  KVO IV  fluids.  Hypokalemia Repleted orally now improved.  3.4 continue oral repletion recheck in the morning.  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  Uncontrolled hypertension: Blood pressure is improved but continues to be elevated on metoprolol Norvasc and hydralazine.  Start back lisinopril.  Continue chlorthalidone  Diabetes mellitus type 2 uncontrolled: A1c of 6.0 has not required insulin blood glucose ranging from 1 11-1 49 continue to monitor.   Sacral decubitus ulcer stage I RN Pressure Injury Documentation: Pressure Injury 12/24/22 Sacrum Mid Stage 1 -  Intact skin with non-blanchable redness of a localized area usually over a bony prominence. healing over wound area, skin is white and intact (Active)  12/24/22 1556  Location: Sacrum  Location Orientation: Mid  Staging: Stage 1 -  Intact skin with non-blanchable redness of a localized area usually over a bony prominence.  Wound Description (Comments): healing over wound area, skin is white and intact  Present on Admission: Yes  Dressing Type None 12/29/22 0930    DVT prophylaxis: lovenox Family Communication:none Status is: Inpatient Remains inpatient appropriate because: Salmonella bacteremia    Code Status:     Code Status Orders  (From admission, onward)           Start     Ordered   12/24/22 1621  Full code  Continuous       Question:  By:  Answer:  Consent: discussion documented in EHR   12/24/22 1627           Code Status History     Date Active Date Inactive Code Status Order ID Comments User Context   11/26/2013 1819 12/01/2013 1922 Full Code [id number]  Thurnell Lose, MD ED   11/18/2012 1529 12/06/2012  [place] Full Code [id number]  Mliss Sax, RN Inpatient   11/12/2012 0827 11/18/2012 1529 Full Code [id number]  Noelle Penner, RN Inpatient   11/08/2012 0010 11/12/2012 0827 Full Code [id number]  Theodis Blaze, MD ED         IV Access:    Peripheral IV   Procedures and diagnostic studies:   IR CHOLANGIOGRAM EXISTING TUBE  Result Date: 12/29/2022 CLINICAL DATA:  47 year old with acute cholecystitis and status post percutaneous cholecystostomy tube placement. Concern for high output from the cholecystostomy tube drain. EXAM: CHOLANGIOGRAM VIA EXISTING CATHETER Physician: Stephan Minister. Anselm Pancoast, MD FLUOROSCOPY: Radiation Exposure Index (as provided by the fluoroscopic device): 72 mGy Kerma MEDICATIONS: None ANESTHESIA/SEDATION: None CONTRAST:  20 mL Omnipaque 300 PROCEDURE: Patient was placed supine on the interventional table. Scout image was obtained. The drain was injected with diluted contrast. Total fluid volume injected was approximately 40 mL. Contrast was aspirated at the end of the procedure. Drain was attached to a gravity bag. FINDINGS: Drain is positioned within the gallbladder. Partial filling of the cystic duct but the entire cystic duct does not fill and there is no contrast within the common bile duct. No evidence for contrast extravasation outside of the gallbladder. COMPLICATIONS: None IMPRESSION: 1. Cholecystostomy tube is positioned in the gallbladder. 2. Cystic duct appears to be obstructed. No filling of the common bile duct. Electronically Signed   By: Markus Daft M.D.   On: 12/29/2022 14:31     Medical Consultants:   None.   Subjective:    Debbie [name] tolerating her diet some nausea  Objective:    Vitals:   12/29/22 1800 12/29/22 2100 12/30/22 0045 12/30/22 0540  BP: (!) 170/94 (!) 180/93 130/73 (!) 158/92  Pulse:  (!) 101 84 94  Resp:  '18 16 16  '$ Temp:  98.4 F (36.9 C) 98.7 F (37.1 C) 98 F (36.7 C)  TempSrc:  Oral Oral Oral  SpO2:  98% 97% 97%  Weight:    105.3 kg  Height:       SpO2: 97 % O2 Flow Rate (L/min): 2 L/min   Intake/Output Summary (Last 24 hours) at 12/30/2022 0934 Last data filed at 12/30/2022 0541 Gross per 24 hour  Intake 1191.09 ml  Output 3480 ml  Net -2288.91 ml     Filed Weights   12/28/22 0407 12/29/22 0631 12/30/22 0540  Weight: 108.7 kg 106.1 kg 105.3 kg    Exam: General exam: In no acute distress. Respiratory system: Good air movement and clear to auscultation. Cardiovascular system: S1 & S2 heard, RRR. No JVD. Gastrointestinal system: Abdomen is nondistended, soft and nontender.  Extremities: No pedal edema. Skin: No rashes, lesions or ulcers Psychiatry: Judgement and insight appear normal. Mood & affect appropriate. Data Reviewed:    Labs: Basic Metabolic Panel: Recent Labs  Lab 12/26/22 0400 12/27/22 0415 12/28/22 0240 12/29/22 0244 12/30/22 0157  NA 136 136 136 137 135  K 2.9* 3.2* 3.2* 3.3* 3.4*  CL 108 106 104 103 101  CO2 20* 20* '22 25 25  '$ GLUCOSE 111* 139* 107* 98 117*  BUN 31* '17 9 9 8  '$ CREATININE 2.06* 1.13* 0.83 0.80 0.72  CALCIUM 8.1* 8.2* 7.9* 7.6* 8.1*  MG  --  1.1*  --   --   --     GFR Estimated Creatinine Clearance: 111.4 mL/min (by C-G formula based on SCr of 0.72 mg/dL). Liver Function Tests: Recent Labs  Lab 12/25/22 0352 12/26/22 0400 12/27/22 0415 12/28/22 0240  12/29/22 1047  AST 12* 11* 13* 8* 14*  ALT 36 '24 16 13 11  '$ ALKPHOS 142* 142* 157* 123 117  BILITOT 0.3 0.2* 0.2* 0.4 0.3  PROT 5.8* 6.0* 6.1* 5.9* 6.0*  ALBUMIN 2.3* 2.3* 2.3* 2.2* 2.3*    No results for input(s): "LIPASE", "AMYLASE" in the last 168 hours. No results for input(s): "AMMONIA" in the last 168 hours. Coagulation profile Recent Labs  Lab 12/24/22 2302 12/25/22 0352  INR 1.2 1.3*    COVID-19 Labs  No results for input(s): "DDIMER", "FERRITIN", "LDH", "CRP" in the last 72 hours.  No results found for: "SARSCOV2NAA"  CBC: Recent Labs  Lab 12/24/22 2302 12/25/22 0352 12/26/22 0400 12/27/22 0415 12/28/22 0240 12/29/22 1047  WBC 15.6* 15.0* 17.4* 21.4* 21.4* 18.4*  NEUTROABS 10.6*  --   --   --   --   --   HGB 7.4* 7.0*  7.0* 8.4* 8.7* 8.2* 7.9*  HCT 24.8* 23.5* 27.3* 28.0* 26.6* 26.0*  MCV 65.6*  65.8* 68.8* 68.6* 68.9* 69.5*  PLT 281 270 334 382 392 434*    Cardiac Enzymes: No results for input(s): "CKTOTAL", "CKMB", "CKMBINDEX", "TROPONINI" in the last 168 hours. BNP (last 3 results) No results for input(s): "PROBNP" in the last 8760 hours. CBG: Recent Labs  Lab 12/25/22 0735 12/26/22 0753 12/27/22 0413 12/27/22 0807 12/28/22 0745  GLUCAP 112* 128* 149* 127* 116*    D-Dimer: No results for input(s): "DDIMER" in the last 72 hours. Hgb A1c: No results for input(s): "HGBA1C" in the last 72 hours.  Lipid Profile: No results for input(s): "CHOL", "HDL", "LDLCALC", "TRIG", "CHOLHDL", "LDLDIRECT" in the last 72 hours. Thyroid function studies: No results for input(s): "TSH", "T4TOTAL", "T3FREE", "THYROIDAB" in the last 72 hours.  Invalid input(s): "FREET3"  Anemia work up: No results for input(s): "VITAMINB12", "FOLATE", "FERRITIN", "TIBC", "IRON", "RETICCTPCT" in the last 72 hours. Sepsis Labs: Recent Labs  Lab 12/24/22 1924 12/24/22 2302 12/25/22 0352 12/26/22 0400 12/27/22 0415 12/28/22 0240 12/29/22 1047  PROCALCITON  --  2.37  --   --   --   --   --   WBC  --  15.6*   < > 17.4* 21.4* 21.4* 18.4*  LATICACIDVEN 0.9  --   --   --   --   --   --    < > = values in this interval not displayed.    Microbiology Recent Results (from the past 240 hour(s))  Culture, blood (Routine X 2) w Reflex to ID Panel     Status: None   Collection Time: 12/24/22  7:24 PM   Specimen: BLOOD  Result Value Ref Range Status   Specimen Description   Final    BLOOD SITE NOT SPECIFIED Performed at [facility] [address] [place], [place] [address]    Special Requests   Final    BOTTLES DRAWN AEROBIC ONLY Blood Culture adequate volume Performed at [facility] [address] [place], [place] [address]    Culture   Final    NO GROWTH 5 DAYS Performed at [facility], [place] [address], [place], [place] [address]    Report  Status 12/30/2022 FINAL  Final  Culture, blood (Routine X 2) w Reflex to ID Panel     Status: None   Collection Time: 12/24/22  7:24 PM   Specimen: BLOOD  Result Value Ref Range Status   Specimen Description   Final    BLOOD SITE NOT  SPECIFIED Performed at [facility], [place] [address] [place], [place] [address]    Special Requests   Final    BOTTLES DRAWN AEROBIC ONLY Blood Culture adequate volume Performed at [facility] [address], [place], [place] [address]    Culture   Final    NO GROWTH 5 DAYS Performed at [facility], [place] [address], [place], [place] [address]    Report Status 12/30/2022 FINAL  Final  Aerobic/Anaerobic Culture w Gram Stain (surgical/deep wound)     Status: None (Preliminary result)   Collection Time: 12/25/22  2:32 PM   Specimen: BILE  Result Value Ref Range Status   Specimen Description   Final    BILE Performed at [facility] [address] [place], [place] [address]    Special Requests   Final    NONE Performed at [facility], [place] [address] [place], [place] [address]    Gram Stain NO WBC SEEN NO ORGANISMS SEEN   Final   Culture   Final    NO GROWTH 4 DAYS Performed at [facility], [place] [address] [place], [place] [address]    Report Status PENDING  Incomplete     Medications:    amLODipine  10 mg Oral Daily   atorvastatin  20 mg Oral Daily   Chlorhexidine Gluconate Cloth  6 each Topical Daily   chlorthalidone  25 mg Oral Daily   docusate sodium  100 mg Oral BID   hydrALAZINE  10 mg Oral Q6H   levofloxacin  750 mg Oral Daily   metoprolol tartrate  12.5 mg Oral BID   pantoprazole  40 mg Oral Daily   polyethylene glycol  17 g Oral Daily   sodium chloride flush  5 mL Intracatheter Q8H   tiZANidine  4 mg Oral Q12H   Continuous Infusions:  0.9 % NaCl with KCl 40 mEq / L 10 mL/hr at 12/29/22 0912   metronidazole 500 mg (12/30/22 0147)       LOS: 6 days   Charlynne Cousins  [facility]  12/30/2022, 9:34 AM

## 2022-12-30 NOTE — Progress Notes (Signed)
Subjective/Chief Complaint: Patient feels like her progress is up and down, having less pain and nausea  Objective: Vital signs in last 24 hours: Temp:  [98 F (36.7 C)-98.7 F (37.1 C)] 98 F (36.7 C) (01/27 0540) Pulse Rate:  [74-101] 94 (01/27 0540) Resp:  [16-20] 16 (01/27 0540) BP: (114-180)/(60-94) 158/92 (01/27 0540) SpO2:  [97 %-98 %] 97 % (01/27 0540) Weight:  [105.3 kg] 105.3 kg (01/27 0540) Last BM Date : 12/24/22  Intake/Output from previous day: 01/26 0701 - 01/27 0700 In: 1551.1 [P.O.:600; I.V.:635.8; IV Piggyback:310.3] Out: 3480 [Urine:3150; Drains:330] Intake/Output this shift: No intake/output data recorded.  General appearance: alert, cooperative, and no distress  GI: abdominal tenderness to palpation at epigastric region and right abdomen around drain, drain is producing bilious drainage  Extremities: extremities normal, atraumatic, no cyanosis or edema  Lab Results:  Recent Labs    12/28/22 0240 12/29/22 1047  WBC 21.4* 18.4*  HGB 8.2* 7.9*  HCT 26.6* 26.0*  PLT 392 434*    BMET Recent Labs    12/29/22 0244 12/30/22 0157  NA 137 135  K 3.3* 3.4*  CL 103 101  CO2 25 25  GLUCOSE 98 117*  BUN 9 8  CREATININE 0.80 0.72  CALCIUM 7.6* 8.1*    PT/INR No results for input(s): "LABPROT", "INR" in the last 72 hours. ABG No results for input(s): "PHART", "HCO3" in the last 72 hours.  Invalid input(s): "PCO2", "PO2"  Studies/Results: IR CHOLANGIOGRAM EXISTING TUBE  Result Date: 12/29/2022 CLINICAL DATA:  47 year old with acute cholecystitis and status post percutaneous cholecystostomy tube placement. Concern for high output from the cholecystostomy tube drain. EXAM: CHOLANGIOGRAM VIA EXISTING CATHETER Physician: Stephan Minister. Anselm Pancoast, MD FLUOROSCOPY: Radiation Exposure Index (as provided by the fluoroscopic device): 72 mGy Kerma MEDICATIONS: None ANESTHESIA/SEDATION: None CONTRAST:  20 mL Omnipaque 300 PROCEDURE: Patient was placed supine on the  interventional table. Scout image was obtained. The drain was injected with diluted contrast. Total fluid volume injected was approximately 40 mL. Contrast was aspirated at the end of the procedure. Drain was attached to a gravity bag. FINDINGS: Drain is positioned within the gallbladder. Partial filling of the cystic duct but the entire cystic duct does not fill and there is no contrast within the common bile duct. No evidence for contrast extravasation outside of the gallbladder. COMPLICATIONS: None IMPRESSION: 1. Cholecystostomy tube is positioned in the gallbladder. 2. Cystic duct appears to be obstructed. No filling of the common bile duct. Electronically Signed   By: Markus Daft M.D.   On: 12/29/2022 14:31    Anti-infectives: Anti-infectives (From admission, onward)    Start     Dose/Rate Route Frequency Ordered Stop   12/29/22 0000  levofloxacin (LEVAQUIN) 750 MG tablet        750 mg Oral Daily 12/28/22 1052 01/26/23 2359   12/29/22 0000  metroNIDAZOLE (FLAGYL) 500 MG tablet       Note to Pharmacy: Patient at [facility] room 1411.  Please deliver when ready.  Also has levofloxacin Rx.   500 mg Oral 2 times daily 12/28/22 1052 01/26/23 2359   12/28/22 1000  levofloxacin (LEVAQUIN) tablet 750 mg        750 mg Oral Daily 12/27/22 0942     12/27/22 1800  metroNIDAZOLE (FLAGYL) tablet 500 mg  Status:  Discontinued        500 mg Oral Every 12 hours 12/27/22 0942 12/27/22 1656   12/27/22 1800  metroNIDAZOLE (FLAGYL) IVPB 500 mg  500 mg 100 mL/hr over 60 Minutes Intravenous Every 8 hours 12/27/22 1656     12/27/22 0830  levofloxacin (LEVAQUIN) IVPB 750 mg        750 mg 100 mL/hr over 90 Minutes Intravenous Every 24 hours 12/27/22 0723 12/27/22 1950   12/27/22 0815  levofloxacin (LEVAQUIN) IVPB 750 mg  Status:  Discontinued        750 mg 100 mL/hr over 90 Minutes Intravenous Daily with breakfast 12/27/22 0720 12/27/22 0723   12/25/22 1500  levofloxacin (LEVAQUIN) IVPB 750 mg  Status:  Discontinued         750 mg 100 mL/hr over 90 Minutes Intravenous Every 48 hours 12/25/22 1326 12/27/22 0720   12/25/22 1427  ceFAZolin (ANCEF) IVPB 2g/100 mL premix        over 30 Minutes Intravenous Continuous PRN 12/25/22 1429 12/25/22 1427   12/25/22 1427  ceFAZolin (ANCEF) 2-4 GM/100ML-% IVPB       Note to Pharmacy: Tressie Stalker R: cabinet override      12/25/22 1427 12/26/22 0244   12/25/22 1415  metroNIDAZOLE (FLAGYL) IVPB 500 mg  Status:  Discontinued        500 mg 100 mL/hr over 60 Minutes Intravenous Every 12 hours 12/25/22 1326 12/27/22 0942       Assessment/Plan: Acute cholecystitis  - s/p perc chole by IR 1/22. High bile output documented - continue to monitor - WBC decreasing, afebrile.  - plan for outpatient follow up IR and surgical follow up for discussion of interval lap chole in 6-8 weeks. Due to high drain output have reached out to IR to discuss possible further evaluation   BRBPR - hgb stable, s/p 1 unit prbc OSH and 1 u here 1/22   FEN: soft diet, colace, add daily miralax ID: levoquin/flagyl VTE: okay for chemical prophylaxis from surgical perspective    Per primary: MS Paraplegia AKI - improved BRBPR - 1 episode at OSH UTI Bacteremia - repeat [place] pending      I reviewed Consultant , ID notes, hospitalist notes, last 24 h vitals and pain scores, last 48 h intake and output, last 24 h labs and trends, and last 24 h imaging results.  Straightforward decision making  Rosario Adie, [facility] 12/30/2022, 9:18 AM Please see Amion for pager number during day hours 7:00am-4:30pm

## 2022-12-31 DIAGNOSIS — R7881 Bacteremia: Secondary | ICD-10-CM | POA: Diagnosis not present

## 2022-12-31 DIAGNOSIS — E876 Hypokalemia: Secondary | ICD-10-CM | POA: Diagnosis not present

## 2022-12-31 DIAGNOSIS — K810 Acute cholecystitis: Secondary | ICD-10-CM | POA: Diagnosis not present

## 2022-12-31 LAB — BASIC METABOLIC PANEL
Anion gap: 10 (ref 5–15)
BUN: 9 mg/dL (ref 6–20)
CO2: 26 mmol/L (ref 22–32)
Calcium: 8.4 mg/dL — ABNORMAL LOW (ref 8.9–10.3)
Chloride: 100 mmol/L (ref 98–111)
Creatinine, Ser: 0.76 mg/dL (ref 0.44–1.00)
GFR, Estimated: >60 mL/min (ref >60)
Glucose, Bld: 105 mg/dL — ABNORMAL HIGH (ref 70–99)
Potassium: 4.1 mmol/L (ref 3.5–5.1)
Sodium: 136 mmol/L (ref 135–145)

## 2022-12-31 LAB — GLUCOSE, CAPILLARY: Glucose-Capillary: 91 mg/dL (ref 70–99)

## 2022-12-31 MED ORDER — SODIUM CHLORIDE 0.9% FLUSH: 5.0000 mL | Freq: Three times a day (TID) | INTRAVENOUS | 0 refills | Status: AC

## 2022-12-31 MED ORDER — ONDANSETRON 4 MG PO TBDP: 4.0000 mg | ORAL_TABLET | Freq: Three times a day (TID) | ORAL | 0 refills | Status: DC | PRN

## 2022-12-31 MED ORDER — LEVOFLOXACIN 750 MG PO TABS: 750.0000 mg | ORAL_TABLET | Freq: Every day | ORAL | 0 refills | Status: DC

## 2022-12-31 MED ORDER — METRONIDAZOLE 500 MG PO TABS: 500.0000 mg | ORAL_TABLET | Freq: Two times a day (BID) | ORAL | 0 refills | Status: DC

## 2022-12-31 NOTE — Progress Notes (Signed)
Pt for d/c today. PICC line removed by IV team.  Foley removed by primary RN per MD order.  Pt was give d/c instruction. Going home with bilary drain per MD. Education how to flush the drain was given. All questions answered.AVS and prescription given.  Pt awaiting on husband to come and bring her w/c since she is w/c dependent.

## 2022-12-31 NOTE — Discharge Summary (Addendum)
Physician Discharge Summary  Debbie [name]:[id number] DOB: 01-Nov-1976 DOA: 12/24/2022  PCP: Debbie Deiters, MD  Admit date: 12/24/2022 Discharge date: 12/31/2022  Admitted From: Home Disposition:  Home  Recommendations for Outpatient Follow-up:  Follow up with PCP in 1-2 weeks Please obtain BMP/CBC in one week   Home Health:No Equipment/Devices:None  Discharge Condition:Stable CODE STATUS:Full Diet recommendation: Heart Healthy   Brief/Interim Summary: 47 y.o. 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  Cultures were positive for Salmonella right upper quadrant ultrasound showed acute cholecystitis with a rise in her creatinine and rectal bleeding her hemoglobin dropped from 9-6 she was transfused several units of packed red blood cells.  [facility] due to progressive deterioration and need for cholecystectomy.  Blood culture grew Salmonella, general surgery and ID were consulted, she status post percutaneous cholecystectomy placement performed by IR on 12/25/2022, 2D echo showed no endocarditis. Surveillance blood cultures on 12/24/2022 negative till date.  Follow-up with surgery in 6 to 8 weeks for discussion of lap chole   Discharge Diagnoses:  Principal Problem:   Acute cholecystitis Active Problems:   HTN (hypertension)   Hypokalemia   Pressure injury of skin   Intractable pain   Salmonella bacteremia   AKI (acute kidney injury) (HCC)  Severe sepsis secondary to acute cholecystitis/ Salmonella bacteremia: General surgery and ID were consulted surgery recommended percutaneous biliary drain placed by IR on 12/25/2022. 2D echo showed no vegetation. Surveillance blood cultures on 12/24/2022 were negative till date. She will  need to follow-up with surgery as an outpatient in 8 weeks for lap chole. ID recommended to continue Flagyl and Levaquin as an outpatient.  Acute kidney injury: With a baseline creatinine of less than 1 in the setting of Mobic, ACE inhibitor and ibuprofen. These were held she was started on IV fluids creatinine returned to baseline.  Hypokalemia: It was very hard to replete but after several days of oral repletion and came up.  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  Uncontrolled essential hypertension: Antihypertensive medications were held on admission they will be resumed as an outpatient no changes made to her medication.  Diabetes mellitus type 2 controlled: With an A1c of 6.0, no changes made to her medication.  Stage I sacral decubitus ulcer present on admission: Noted.   Discharge Instructions  Discharge Instructions     Diet - low sodium heart healthy   Complete by: As directed    Increase activity slowly   Complete by: As directed    No wound care   Complete by: As directed       Allergies as of 12/31/2022       Reactions   Honey Bee Treatment [bee Venom] Anaphylaxis, Itching   Baclofen    Headaches         Medication List     TAKE these medications    atorvastatin 20 MG tablet Commonly known as: LIPITOR Take 20 mg by mouth daily.   chlorthalidone 25 MG tablet Commonly known as: HYGROTON Take 25 mg by mouth daily.   gabapentin 300 MG capsule Commonly known as: NEURONTIN take 2 capsules by mouth three times a day What changed: See the new instructions.   Lantus SoloStar 100 UNIT/ML Solostar Pen  Generic drug: insulin glargine Inject 24 Units into the skin daily.   levofloxacin 750 MG tablet Commonly known as: LEVAQUIN Take 1 tablet (750 mg total) by mouth daily for 28 days.   lisinopril 40 MG tablet Commonly known as: ZESTRIL Take 1 tablet (40 mg total) by mouth daily.    meloxicam 15 MG tablet Commonly known as: MOBIC Take 1 tablet (15 mg total) by mouth daily. What changed:  when to take this reasons to take this   metFORMIN 1000 MG tablet Commonly known as: GLUCOPHAGE Take 1,000 mg by mouth 2 (two) times daily with a meal.   metroNIDAZOLE 500 MG tablet Commonly known as: FLAGYL Take 1 tablet (500 mg total) by mouth 2 (two) times daily for 28 days. (no alcohol)   metroNIDAZOLE 500 MG tablet Commonly known as: FLAGYL Take 1 tablet (500 mg total) by mouth 2 (two) times daily for 28 days.   norethindrone 0.35 MG tablet Commonly known as: MICRONOR TAKE 1 TABLET(0.35 MG) BY MOUTH DAILY   ondansetron 4 MG disintegrating tablet Commonly known as: ZOFRAN-ODT Take 1 tablet (4 mg total) by mouth every 8 (eight) hours as needed for nausea or vomiting.   pantoprazole 40 MG tablet Commonly known as: PROTONIX Take 1 tablet (40 mg total) by mouth daily.   sodium chloride flush 0.9 % Soln Commonly known as: NS Use 5 mLs by Intracatheter route daily.   tiZANidine 4 MG tablet Commonly known as: ZANAFLEX TAKE 1.5 TABLETS BY MOUTH 4 TIMES DAILY. What changed: See the new instructions.        Allergies  Allergen Reactions   Honey Bee Treatment [Bee Venom] Anaphylaxis and Itching   Baclofen     Headaches     Consultations: General surgery Interventional radiology Infectious disease.   Procedures/Studies: IR CHOLANGIOGRAM EXISTING TUBE  Result Date: 12/29/2022 CLINICAL DATA:  47 year old with acute cholecystitis and status post percutaneous cholecystostomy tube placement. Concern for high output from the cholecystostomy tube drain. EXAM: CHOLANGIOGRAM VIA EXISTING CATHETER Physician: Rachelle Hora. Lowella Dandy, MD FLUOROSCOPY: Radiation Exposure Index (as provided by the fluoroscopic device): 72 mGy Kerma MEDICATIONS: None ANESTHESIA/SEDATION: None CONTRAST:  20 mL Omnipaque 300 PROCEDURE: Patient was placed supine on the interventional table. Scout image  was obtained. The drain was injected with diluted contrast. Total fluid volume injected was approximately 40 mL. Contrast was aspirated at the end of the procedure. Drain was attached to a gravity bag. FINDINGS: Drain is positioned within the gallbladder. Partial filling of the cystic duct but the entire cystic duct does not fill and there is no contrast within the common bile duct. No evidence for contrast extravasation outside of the gallbladder. COMPLICATIONS: None IMPRESSION: 1. Cholecystostomy tube is positioned in the gallbladder. 2. Cystic duct appears to be obstructed. No filling of the common bile duct. Electronically Signed   By: Richarda Overlie M.D.   On: 12/29/2022 14:31   CT ABDOMEN PELVIS WO CONTRAST  Result Date: 12/27/2022 CLINICAL DATA:  Right lower quadrant abdominal pain EXAM: CT ABDOMEN AND PELVIS WITHOUT CONTRAST TECHNIQUE: Multidetector CT imaging of the abdomen and pelvis was performed following the standard protocol without IV contrast. RADIATION DOSE REDUCTION: This exam was performed according to the departmental dose-optimization program which includes automated exposure control, adjustment of the mA and/or kV according to patient size and/or use of iterative reconstruction technique. COMPARISON:  12/25/2022 FINDINGS: Lower chest: Trace bilateral pleural effusions and adjacent lower lobe atelectasis. Mild cardiomegaly. Hepatobiliary: Percutaneous cholecystostomy tube in place. Trace amount of  gas in the decompressed but thick-walled gallbladder. Surrounding inflammatory findings and ascites similar to previous. Pancreas: Unremarkable Spleen: Unremarkable Adrenals/Urinary Tract: Borderline fullness of the right renal collecting system as on the prior exam, without hydroureter. There is a Foley catheter in the nearly empty urinary bladder. No urinary tract calculi observed. Stomach/Bowel: The fluid and inflammatory findings in the vicinity of the gallbladder also tracks around the descending  duodenum, without a definite and duodenal abnormality. Contrast medium in the small bowel and colon. No dilated bowel. Vascular/Lymphatic: Mild aortoiliac atherosclerotic vascular disease. Scattered small lymph nodes along the porta hepatis and retroperitoneum are likely reactive. Reproductive: Unremarkable Other: Subcutaneous and flank edema. Continued mesenteric edema along with perihepatic ascites and ascites tracking along the right paracolic gutter. Small to moderate amount of pelvic ascites similar to prior. Trace presacral edema. Subcutaneous edema extends down along the pubis, mildly increased from prior. 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 No collapse or flattening. Lower lumbar spondylosis and degenerative disc disease. IMPRESSION: 1. Percutaneous cholecystostomy tube in place. The gallbladder is decompressed but thick-walled with surrounding fluid and inflammatory findings and ascites similar to prior. 2. Continued mesenteric edema along with perihepatic ascites and ascites tracking along the right paracolic gutter. 3. Trace bilateral pleural effusions and adjacent lower lobe atelectasis. 4. Mild cardiomegaly. 5. 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 6. Chronic borderline fullness of the right renal collecting system without hydroureter. 7. Aortic atherosclerosis. Aortic Atherosclerosis (ICD10-I70.0). Electronically Signed   By: Gaylyn Rong M.D.   On: 12/27/2022 16:44   ECHOCARDIOGRAM COMPLETE  Result Date: 12/25/2022    ECHOCARDIOGRAM REPORT   Patient Name:   DELSA MILNOR Date of Exam: 12/25/2022 Medical Rec #:  [id number]      Height:       68.5 in Accession #:    [id number]     Weight:       252.6 lb Date of Birth:  [birth year]      BSA:          2.269 m Patient Age:    46 years       BP:           181/101 mmHg Patient Gender: F              HR:           101 bpm. Exam Location:  Inpatient  Procedure: 2D Echo, Cardiac Doppler and Color Doppler Indications:    bacteremia  History:        Patient has no prior history of Echocardiogram examinations.                 Risk Factors:Hypertension.  Sonographer:    Mike Gip Referring Phys: [id number] SARA-MAIZ A THOMAS IMPRESSIONS  1. Left ventricular ejection fraction, by estimation, is 55 to 60%. The left ventricle has normal function. The left ventricle has no regional wall motion abnormalities. There is moderate concentric left ventricular hypertrophy. Left ventricular diastolic parameters were normal.  2. Right ventricular systolic function is normal. The right ventricular size is normal.  3. The mitral valve is normal in structure. No evidence of mitral valve regurgitation. No evidence of mitral stenosis.  4. The aortic valve is tricuspid. Aortic valve regurgitation is not visualized. No aortic stenosis is present.  5. Aortic dilatation noted. There is mild dilatation of the aortic root, measuring 39 mm.  6. The inferior vena cava is dilated in size with >  50% respiratory variability, suggesting right atrial pressure of 8 mmHg. FINDINGS  Left Ventricle: Left ventricular ejection fraction, by estimation, is 55 to 60%. The left ventricle has normal function. The left ventricle has no regional wall motion abnormalities. The left ventricular internal cavity size was normal in size. There is  moderate concentric left ventricular hypertrophy. Left ventricular diastolic parameters were normal. Indeterminate filling pressures. Right Ventricle: The right ventricular size is normal. No increase in right ventricular wall thickness. Right ventricular systolic function is normal. Left Atrium: Left atrial size was normal in size. Right Atrium: Right atrial size was normal in size. Pericardium: There is no evidence of pericardial effusion. Mitral Valve: The mitral valve is normal in structure. No evidence of mitral valve regurgitation. No evidence of mitral valve  stenosis. Tricuspid Valve: The tricuspid valve is normal in structure. Tricuspid valve regurgitation is not demonstrated. No evidence of tricuspid stenosis. Aortic Valve: The aortic valve is tricuspid. Aortic valve regurgitation is not visualized. No aortic stenosis is present. Pulmonic Valve: The pulmonic valve was normal in structure. Pulmonic valve regurgitation is not visualized. No evidence of pulmonic stenosis. Aorta: Aortic dilatation noted. There is mild dilatation of the aortic root, measuring 39 mm. Venous: The inferior vena cava is dilated in size with greater than 50% respiratory variability, suggesting right atrial pressure of 8 mmHg. IAS/Shunts: No atrial level shunt detected by color flow Doppler.  LEFT VENTRICLE PLAX 2D LVIDd:         4.70 cm      Diastology LVIDs:         3.30 cm      LV e' medial:    12.80 cm/s LV PW:         1.50 cm      LV E/e' medial:  9.8 LV IVS:        1.50 cm      LV e' lateral:   11.60 cm/s LVOT diam:     2.20 cm      LV E/e' lateral: 10.8 LV SV:         67 LV SV Index:   29 LVOT Area:     3.80 cm  LV Volumes (MOD) LV vol d, MOD A2C: 117.0 ml LV vol d, MOD A4C: 118.0 ml LV vol s, MOD A2C: 41.9 ml LV vol s, MOD A4C: 47.2 ml LV SV MOD A2C:     75.1 ml LV SV MOD A4C:     118.0 ml LV SV MOD BP:      74.2 ml RIGHT VENTRICLE             IVC RV Basal diam:  3.20 cm     IVC diam: 2.10 cm RV S prime:     19.70 cm/s TAPSE (M-mode): 3.1 cm LEFT ATRIUM             Index        RIGHT ATRIUM           Index LA diam:        3.00 cm 1.32 cm/m   RA Area:     18.90 cm LA Vol (A2C):   67.1 ml 29.57 ml/m  RA Volume:   45.20 ml  19.92 ml/m LA Vol (A4C):   51.0 ml 22.47 ml/m LA Biplane Vol: 61.1 ml 26.92 ml/m  AORTIC VALVE LVOT Vmax:   93.20 cm/s LVOT Vmean:  64.600 cm/s LVOT VTI:    0.176 m  AORTA Ao Root diam: 3.90 cm Ao  Asc diam:  3.30 cm MITRAL VALVE MV Area (PHT): 6.37 cm     SHUNTS MV Decel Time: 119 msec     Systemic VTI:  0.18 m MV E velocity: 125.00 cm/s  Systemic Diam: 2.20 cm  MV A velocity: 156.00 cm/s MV E/A ratio:  0.80 Chilton Si MD Electronically signed by Chilton Si MD Signature Date/Time: 12/25/2022/6:25:32 PM    Final    IR Perc Cholecystostomy  Result Date: 12/25/2022 INDICATION: Acute cholecystitis EXAM: ULTRASOUND AND FLUOROSCOPIC-GUIDED CHOLECYSTOSTOMY TUBE PLACEMENT COMPARISON:  US Abdomen, 12/24/2022.  CT AP 12/20/2022. MEDICATIONS: 2 g Ancef IV. Antibiotics were administered within an appropriate time frame prior to skin puncture. ANESTHESIA/SEDATION: Moderate (conscious) sedation was employed during this procedure. A total of Versed 2 mg and Fentanyl 100 mcg was administered intravenously. Moderate Sedation Time: 14 minutes. The patient's level of consciousness and vital signs were monitored continuously by radiology nursing throughout the procedure under my direct supervision. CONTRAST:  15mL OMNIPAQUE IOHEXOL 300 MG/ML SOLN - administered into the gallbladder fossa. FLUOROSCOPY TIME:  Fluoroscopic dose; 10 mGy COMPLICATIONS: None immediate. PROCEDURE: Informed written consent was obtained from the patient after a discussion of the risks, benefits and alternatives to treatment. Questions regarding the procedure were encouraged and answered. A timeout was performed prior to the initiation of the procedure. The right upper abdominal quadrant was prepped and draped in the usual sterile fashion, and a sterile drape was applied covering the operative field. Maximum barrier sterile technique with sterile gowns and gloves were used for the procedure. A timeout was performed prior to the initiation of the procedure. Local anesthesia was provided with 1% lidocaine with epinephrine. Ultrasound scanning of the right upper quadrant demonstrates a markedly dilated gallbladder. Utilizing a transhepatic approach, a 22 gauge needle was advanced into the gallbladder under direct ultrasound guidance. An ultrasound image was saved for documentation purposes. Appropriate  intraluminal puncture was confirmed with the efflux of bile and advancement of an 0.018 wire into the gallbladder lumen. The needle was exchanged for an Accustick set. A small amount of contrast was injected to confirm appropriate intraluminal positioning. Over a Benson wire, a 10.2-French Cook cholecystomy tube was advanced into the gallbladder fossa, coiled and locked. Bile was aspirated and a small amount of contrast was injected as several post procedural spot radiographic images were obtained in various obliquities. The catheter was secured to the skin with suture, connected to a drainage bag and a dressing was placed. The patient tolerated the procedure well without immediate post procedural complication. IMPRESSION: 1. Successful placement of a 10 Fr percutaneous cholecystostomy drainage catheter, as above. 2. 80 mL of bilious drainage was aspirated. A sample was submitted to microbiology for analysis. PLAN: The patient will return to Vascular Interventional Radiology (VIR) for routine drainage catheter evaluation and exchange in 6-8 weeks. Roanna Banning, MD [facility] Electronically Signed   By: Roanna Banning M.D.   On: 12/25/2022 16:24   CT RENAL STONE STUDY  Result Date: 12/25/2022 CLINICAL DATA:  Flank pain EXAM: CT ABDOMEN AND PELVIS WITHOUT CONTRAST TECHNIQUE: Multidetector CT imaging of the abdomen and pelvis was performed following the standard protocol without IV contrast. RADIATION DOSE REDUCTION: This exam was performed according to the departmental dose-optimization program which includes automated exposure control, adjustment of the mA and/or kV according to patient size and/or use of iterative reconstruction technique. COMPARISON:  Ultrasound 12/24/2022.  CT 12/20/2022 and older FINDINGS: Lower chest: There is some linear opacity lung bases  likely scar or atelectasis. No pleural effusion. Coronary artery calcifications are seen. Please  correlate for other coronary risk factors. Hepatobiliary: On this non IV contrast exam, the liver is grossly preserved. However there is placement of a cholecystostomy tube. Within the location of the gallbladder the gallbladder is thickened with sludge or stones and adjacent stranding. There is some ill-defined fluid as well with some ascites along the margin of the liver. Small loculated area posterior to the gallbladder is seen as well on series 2, image 44 measuring 4.4 x 3.3 cm. This could represent sequela of recent intervention. Adjacent stranding as well. Pancreas: Global mild pancreatic atrophy, unchanged from previous. Spleen: Spleen is nonenlarged.  Small splenule. Adrenals/Urinary Tract: Adrenal glands are preserved. Foley catheter within the collapsed urinary bladder. No abnormal calcifications seen in the left kidney nor along the course of the left ureter. Mild ectasia of the right renal collecting system and ureter, nonspecific. No obstructing renal stones. The calcification seen in the right hemipelvis are likely vascular based on the prior examination. Increasing perinephric stranding on the right. Stomach/Bowel: Stomach is collapsed. There is some wall thickening along the second portion of the duodenal related to the adjacent inflammatory process. No obstruction. The small bowel otherwise is nondilated on this non oral contrast exam. Large bowel has scattered debris in stool. No obstruction. Redundant course to the sigmoid colon. Vascular/Lymphatic: Normal caliber aorta and IVC with scattered vascular calcifications. There are several prominent but less than 1 cm in size nodes identified in the retroperitoneum and upper abdomen, not pathologic by size criteria. Reproductive: Uterus and bilateral adnexa are unremarkable. Other: Scattered ascites identified in the abdomen and pelvis. This is new from prior. Musculoskeletal: Mild degenerative changes along the spine and pelvis. There is some  sclerosis identified along both femoral heads. Please correlate for evidence of AVN. IMPRESSION: Interval placement of a percutaneous cholecystostomy tube. The gallbladder is collapsed with wall thickening. There is surrounding mild ascites and ill-defined fluid. Significant stranding tracking along the right anterior perinephric space. Some free fluid in the pelvis as well. Mild ectasia of the right renal collecting system compared to previous without obstructing stone. Etiology is uncertain. Please correlate with patient's renal function and follow up. A contrast study may be of some benefit when clinically appropriate. Bladder is collapsed with Foley catheter. Sclerotic areas along the femoral heads bilaterally. Please correlate for any history of AVN. Electronically Signed   By: Karen Kays M.D.   On: 12/25/2022 16:06   US Abdomen Limited RUQ (LIVER/GB)  Result Date: 12/24/2022 CLINICAL DATA:  Evaluate for acute cholecystitis. EXAM: ULTRASOUND ABDOMEN LIMITED RIGHT UPPER QUADRANT COMPARISON:  None Available. FINDINGS: Gallbladder: Echogenic gallstones and a mild-to-moderate amount of layering echogenic material are again seen within the dependent portion of the gallbladder lumen. The gallbladder wall measures 5.1 mm in thickness. A 3.3 cm x 1.9 cm x 2.2 cm anechoic area is seen adjacent to the gallbladder. Pericholecystic fluid is again seen. No sonographic Murphy sign noted by sonographer. Common bile duct: Diameter: 6.0 mm Liver: No focal lesion identified. Within normal limits in parenchymal echogenicity. Portal vein is patent on color Doppler imaging with normal direction of blood flow towards the liver. Other: None. IMPRESSION: 1. Cholelithiasis and gallbladder sludge with additional findings concerning for acute cholecystitis. 2. Cystic appearing area adjacent to the gallbladder which may represent sequelae associated with gallbladder perforation. Electronically Signed   By: Aram Candela M.D.    On: 12/24/2022 21:51   US RENAL  Result Date: 12/24/2022 CLINICAL DATA:  Acute kidney injury. EXAM: RENAL / URINARY TRACT ULTRASOUND COMPLETE COMPARISON:  None Available. FINDINGS: Right Kidney: Renal measurements: 14.2 cm x 6.9 cm x 7.5 cm = volume: 400.3 mL. Echogenicity within normal limits. No mass is visualized. Mild right-sided hydronephrosis is noted. A trace amount of perinephric fluid is also seen. Left Kidney: Renal measurements: 14.2 cm x 6.4 cm x 7.0 cm = volume: 331.2 mL. Echogenicity within normal limits. No mass or hydronephrosis visualized. Bladder: Appears normal for degree of bladder distention. The ureteral jets are not well visualized, as per the ultrasound technologist. Other: None. IMPRESSION: 1. Mild right-sided hydronephrosis with a trace amount of right-sided perinephric fluid. Electronically Signed   By: Aram Candela M.D.   On: 12/24/2022 21:38   Portable chest 1 View  Result Date: 12/24/2022 CLINICAL DATA:  PICC line placement EXAM: PORTABLE CHEST 1 VIEW COMPARISON:  Portable exam 1812 hours compared to 11/30/2013 FINDINGS: Tip of RIGHT arm PICC line projects over SVC. Borderline enlargement of cardiac silhouette. Mediastinal contours and pulmonary vascularity normal. Minimal subsegmental atelectasis at LEFT base. Remaining lungs clear. No pleural effusion or pneumothorax. IMPRESSION: Tip of RIGHT arm PICC line projects over SVC. Minimal LEFT basilar subsegmental atelectasis. Electronically Signed   By: Ulyses Southward M.D.   On: 12/24/2022 21:25   (Echo, Carotid, EGD, Colonoscopy, ERCP)    Subjective: No complaints  Discharge Exam: Vitals:   12/31/22 0427 12/31/22 0523  BP: 131/80 (!) 163/95  Pulse: 94 87  Resp: 20   Temp: 99 F (37.2 C)   SpO2: 96%    Vitals:   12/30/22 2141 12/31/22 0427 12/31/22 0500 12/31/22 0523  BP: (!) 132/92 131/80  (!) 163/95  Pulse: 100 94  87  Resp:  20    Temp:  99 F (37.2 C)    TempSrc:  Oral    SpO2:  96%    Weight:    100.4 kg   Height:        General: Pt is alert, awake, not in acute distress Cardiovascular: RRR, S1/S2 +, no rubs, no gallops Respiratory: CTA bilaterally, no wheezing, no rhonchi Abdominal: Soft, NT, ND, bowel sounds + Extremities: no edema, no cyanosis    The results of significant diagnostics from this hospitalization (including imaging, microbiology, ancillary and laboratory) are listed below for reference.     Microbiology: Recent Results (from the past 240 hour(s))  Culture, blood (Routine X 2) w Reflex to ID Panel     Status: None   Collection Time: 12/24/22  7:24 PM   Specimen: BLOOD  Result Value Ref Range Status   Specimen Description   Final    BLOOD SITE NOT SPECIFIED Performed at [facility], [address], [place], [place] [address]    Special Requests   Final    BOTTLES DRAWN AEROBIC ONLY Blood Culture adequate volume Performed at [facility], [address] [place], [place] [address]    Culture   Final    NO GROWTH 5 DAYS Performed at [facility], [address] [place], [place] [address]    Report Status 12/30/2022 FINAL  Final  Culture, blood (Routine X 2) w Reflex to ID Panel     Status: None   Collection Time: 12/24/22  7:24 PM   Specimen: BLOOD  Result Value Ref Range Status   Specimen Description   Final    BLOOD SITE NOT SPECIFIED Performed at [facility],  [address] [place], [place] [address]    Special Requests   Final    BOTTLES DRAWN AEROBIC ONLY Blood Culture adequate volume Performed at [facility], [address], [place], [place] [address]    Culture   Final    NO GROWTH 5 DAYS Performed at [facility], [address], [place], [place] [address]    Report Status 12/30/2022 FINAL  Final  Aerobic/Anaerobic Culture w Gram Stain (surgical/deep wound)     Status: None   Collection Time: 12/25/22  2:32 PM   Specimen: BILE  Result Value  Ref Range Status   Specimen Description   Final    BILE Performed at [facility], [address], [place], [place] [address]    Special Requests   Final    NONE Performed at [facility], [address], [place], [place] [address]    Gram Stain NO WBC SEEN NO ORGANISMS SEEN   Final   Culture   Final    No growth aerobically or anaerobically. Performed at [facility], [address], [place], [place] [address]    Report Status 12/30/2022 FINAL  Final     Labs: BNP (last 3 results) No results for input(s): "BNP" in the last 8760 hours. Basic Metabolic Panel: Recent Labs  Lab 12/27/22 0415 12/28/22 0240 12/29/22 0244 12/30/22 0157 12/31/22 0251  NA 136 136 137 135 136  K 3.2* 3.2* 3.3* 3.4* 4.1  CL 106 104 103 101 100  CO2 20* 22 25 25 26   GLUCOSE 139* 107* 98 117* 105*  BUN 17 9 9 8 9   CREATININE 1.13* 0.83 0.80 0.72 0.76  CALCIUM 8.2* 7.9* 7.6* 8.1* 8.4*  MG 1.1*  --   --   --   --    Liver Function Tests: Recent Labs  Lab 12/25/22 0352 12/26/22 0400 12/27/22 0415 12/28/22 0240 12/29/22 1047  AST 12* 11* 13* 8* 14*  ALT 36 24 16 13 11   ALKPHOS 142* 142* 157* 123 117  BILITOT 0.3 0.2* 0.2* 0.4 0.3  PROT 5.8* 6.0* 6.1* 5.9* 6.0*  ALBUMIN 2.3* 2.3* 2.3* 2.2* 2.3*   No results for input(s): "LIPASE", "AMYLASE" in the last 168 hours. No results for input(s): "AMMONIA" in the last 168 hours. CBC: Recent Labs  Lab 12/24/22 2302 12/25/22 0352 12/26/22 0400 12/27/22 0415 12/28/22 0240 12/29/22 1047  WBC 15.6* 15.0* 17.4* 21.4* 21.4* 18.4*  NEUTROABS 10.6*  --   --   --   --   --   HGB 7.4* 7.0*  7.0* 8.4* 8.7* 8.2* 7.9*  HCT 24.8* 23.5* 27.3* 28.0* 26.6* 26.0*  MCV 65.6* 65.8* 68.8* 68.6* 68.9* 69.5*  PLT 281 270 334 382 392 434*   Cardiac Enzymes: No results for input(s): "CKTOTAL", "CKMB", "CKMBINDEX", "TROPONINI" in the last 168 hours. BNP: Invalid input(s): "POCBNP" CBG: Recent Labs  Lab  12/25/22 0735 12/26/22 0753 12/27/22 0413 12/27/22 0807 12/28/22 0745  GLUCAP 112* 128* 149* 127* 116*   D-Dimer No results for input(s): "DDIMER" in the last 72 hours. Hgb A1c No results for input(s): "HGBA1C" in the last 72 hours. Lipid Profile No results for input(s): "CHOL", "HDL", "LDLCALC", "TRIG", "CHOLHDL", "LDLDIRECT" in the last 72 hours. Thyroid function studies No results for input(s): "TSH", "T4TOTAL", "T3FREE", "THYROIDAB" in the last 72 hours.  Invalid input(s): "FREET3" Anemia work up No results for input(s): "VITAMINB12", "FOLATE", "FERRITIN", "TIBC", "IRON", "RETICCTPCT" in the last 72 hours. Urinalysis  Component Value Date/Time   COLORURINE YELLOW 12/25/2022 0822   APPEARANCEUR HAZY (A) 12/25/2022 0822   LABSPEC 1.010 12/25/2022 0822   PHURINE 5.0 12/25/2022 0822   GLUCOSEU NEGATIVE 12/25/2022 0822   HGBUR SMALL (A) 12/25/2022 0822   BILIRUBINUR NEGATIVE 12/25/2022 0822   KETONESUR NEGATIVE 12/25/2022 0822   PROTEINUR NEGATIVE 12/25/2022 0822   UROBILINOGEN 1.0 11/26/2013 1630   NITRITE NEGATIVE 12/25/2022 0822   LEUKOCYTESUR TRACE (A) 12/25/2022 0822   Sepsis Labs Recent Labs  Lab 12/26/22 0400 12/27/22 0415 12/28/22 0240 12/29/22 1047  WBC 17.4* 21.4* 21.4* 18.4*   Microbiology Recent Results (from the past 240 hour(s))  Culture, blood (Routine X 2) w Reflex to ID Panel     Status: None   Collection Time: 12/24/22  7:24 PM   Specimen: BLOOD  Result Value Ref Range Status   Specimen Description   Final    BLOOD SITE NOT SPECIFIED Performed at [facility], [address], [place], [place] [address]    Special Requests   Final    BOTTLES DRAWN AEROBIC ONLY Blood Culture adequate volume Performed at [facility], [address] [place], [place] [address]    Culture   Final    NO GROWTH 5 DAYS Performed at [facility], [address] [place], [place] [address]    Report Status 12/30/2022  FINAL  Final  Culture, blood (Routine X 2) w Reflex to ID Panel     Status: None   Collection Time: 12/24/22  7:24 PM   Specimen: BLOOD  Result Value Ref Range Status   Specimen Description   Final    BLOOD SITE NOT SPECIFIED Performed at [facility], [address] [place], [place] [address]    Special Requests   Final    BOTTLES DRAWN AEROBIC ONLY Blood Culture adequate volume Performed at [facility], [address], [place], [place] [address]    Culture   Final    NO GROWTH 5 DAYS Performed at [facility], [address], [place], [place] [address]    Report Status 12/30/2022 FINAL  Final  Aerobic/Anaerobic Culture w Gram Stain (surgical/deep wound)     Status: None   Collection Time: 12/25/22  2:32 PM   Specimen: BILE  Result Value Ref Range Status   Specimen Description   Final    BILE Performed at [facility], [address], [place], [place] [address]    Special Requests   Final    NONE Performed at [facility], [address], [place], [place] [address]    Gram Stain NO WBC SEEN NO ORGANISMS SEEN   Final   Culture   Final    No growth aerobically or anaerobically. Performed at [facility], [address] [place], [place] [address]    Report Status 12/30/2022 FINAL  Final    SIGNED:   Marinda Elk, MD  [facility] 12/31/2022, 8:22 AM Pager   If 7PM-7AM, please contact night-coverage [email or web address] Password [email or web address]

## 2022-12-31 NOTE — Progress Notes (Signed)
Patient discharged: Home with family  Via: Wheelchair   Discharge paperwork given: to patient and family  Reviewed with teach back   telemetry disconnected  Belongings given to patient

## 2023-01-01 ENCOUNTER — Other Ambulatory Visit (HOSPITAL_COMMUNITY): Payer: Self-pay

## 2023-01-05 ENCOUNTER — Other Ambulatory Visit (HOSPITAL_COMMUNITY): Payer: Self-pay

## 2023-01-08 ENCOUNTER — Ambulatory Visit: Payer: Medicare HMO | Admitting: Internal Medicine

## 2023-01-08 ENCOUNTER — Encounter: Payer: Self-pay | Admitting: Internal Medicine

## 2023-01-08 ENCOUNTER — Other Ambulatory Visit: Payer: Self-pay

## 2023-01-08 VITALS — BP 135/93 | HR 98 | Temp 97.8°F

## 2023-01-08 DIAGNOSIS — K819 Cholecystitis, unspecified: Secondary | ICD-10-CM | POA: Diagnosis not present

## 2023-01-08 MED ORDER — AMOXICILLIN-POT CLAVULANATE 875-125 MG PO TABS: 1.0000 | ORAL_TABLET | Freq: Two times a day (BID) | ORAL | 0 refills | Status: DC

## 2023-01-08 NOTE — Progress Notes (Unsigned)
Patient: Debbie [name]  DOB: 12-Jan-1976 MRN: [id number] PCP: Neale Burly, MD   Chief Complaint  Patient presents with   Follow-up     Patient Active Problem List   Diagnosis Date Noted   Salmonella bacteremia 12/28/2022   AKI (acute kidney injury) ([place]) 12/28/2022   Acute cholecystitis 12/24/2022   Pressure injury of skin 12/24/2022   Intractable pain 12/24/2022   Fibroid 10/20/2021   Abnormal CT scan, pelvis 10/13/2021   Encounter for cervical Pap smear with pelvic exam 10/13/2021   CAP (community acquired pneumonia) 11/26/2013   Tendonitis of elbow, left 05/26/2013   Neurogenic bladder 01/29/2013   Neurogenic bowel 01/29/2013   GERD (gastroesophageal reflux disease) 01/29/2013   Myalgia and myositis, unspecified 12/24/2012   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  Patient transferred from [facility] to [place] on 1/21 with Salmonella bacteremia and acute cholecystitis with worsening renal failure.-At OSH she had presented with 3-day history of nausea and vomiting.  Urine cultures grew E. coli.  Initially on ceftriaxone and metronidazole then transition to Levaquin given blood cultures returned positive for Salmonella.  She was found to have cholecystitis on imaging.  At our facility right upper quadrant ultrasound showed cholelithiasis and gallbladder sludge with additional concern for  acute cholecystitis, concern for gallbladder perforation as well.  Radiology placed a cholecystostomy tube for perforated cholecystitis on 1/22, cultures with no growth.  General surgery plans on outpatient follow-up at 6 to 8 weeks.  ID engaged patient discharged on Levaquin and metronidazole given bacteremia secondary to gallbladder.  Repeat CT on 1/24 showed decompressed gallbladder with thick walled and surrounding fluid inflammatory findings and ascites similar to prior.  Mesenteric edema along perihepatic bursitis enthesitis tracking along paracolic gutter. Today, pt reports she feels much better. Denies abdominal pain. She is adherent to levaquin and metronidazole.    Review of Systems  All other systems reviewed and are negative.   Past Medical History:  Diagnosis Date   Chronic myofascial pain    Chronic pain    Fibroid 10/20/2021   Hypertension    Mass    T5-T6 intradural mass   Neurogenic bladder    and bowel   Spastic paraplegia     Outpatient Medications Prior to Visit  Medication Sig Dispense Refill   atorvastatin (LIPITOR) 20 MG tablet Take 20 mg by mouth daily.     chlorthalidone (HYGROTON) 25 MG tablet Take 25 mg by mouth daily.     gabapentin (NEURONTIN) 300 MG capsule take 2 capsules by mouth three times a day (Patient taking differently: Take 600 mg by mouth 3 (three) times daily.) 180 capsule 1   levofloxacin (LEVAQUIN) 750 MG tablet Take 1 tablet (750 mg total) by mouth daily for 28 days. 28 tablet 0   lisinopril (PRINIVIL,ZESTRIL) 40 MG tablet Take 1 tablet (40 mg total) by mouth daily. 30 tablet 1   metFORMIN (GLUCOPHAGE) 1000 MG tablet Take  1,000 mg by mouth 2 (two) times daily with a meal.     metroNIDAZOLE (FLAGYL) 500 MG tablet Take 1 tablet (500 mg total) by mouth 2 (two) times daily for 28 days. (no alcohol) 56 tablet 0   ondansetron (ZOFRAN-ODT) 4 MG disintegrating tablet Take 1 tablet (4 mg total) by mouth every 8 (eight) hours as needed for nausea or  vomiting. 20 tablet 0   pantoprazole (PROTONIX) 40 MG tablet Take 1 tablet (40 mg total) by mouth daily. 30 tablet 5   sodium chloride flush (NS) 0.9 % SOLN Use 5 mLs by Intracatheter route daily. 300 mL 0   sodium chloride flush (NS) 0.9 % SOLN 5 mLs by Intracatheter route every 8 (eight) hours for 14 days. 210 mL 0   tiZANidine (ZANAFLEX) 4 MG tablet TAKE 1.5 TABLETS BY MOUTH 4 TIMES DAILY. (Patient taking differently: Take 4 mg by mouth 4 (four) times daily.) 180 tablet 5   insulin glargine (LANTUS SOLOSTAR) 100 UNIT/ML Solostar Pen Inject 24 Units into the skin daily. (Patient not taking: Reported on 01/08/2023)     meloxicam (MOBIC) 15 MG tablet Take 1 tablet (15 mg total) by mouth daily. (Patient not taking: Reported on 01/08/2023) 30 tablet 4   metroNIDAZOLE (FLAGYL) 500 MG tablet Take 1 tablet (500 mg total) by mouth 2 (two) times daily for 28 days. (Patient not taking: Reported on 01/08/2023) 56 tablet 0   norethindrone (MICRONOR) 0.35 MG tablet TAKE 1 TABLET(0.35 MG) BY MOUTH DAILY (Patient not taking: Reported on 12/24/2022) 28 tablet 11   Facility-Administered Medications Prior to Visit  Medication Dose Route Frequency Provider Last Rate Last Admin   incobotulinumtoxinA (XEOMIN) 100 units injection 100 Units  100 Units Intramuscular Q90 days Marcial Pacas, MD   100 Units at 08/22/17 1550     Allergies  Allergen Reactions   Honey Bee Treatment [Bee Venom] Anaphylaxis and Itching   Baclofen     Headaches     Social History   Tobacco Use   Smoking status: Former    Packs/day: 1.50    Years: 10.00    Total pack years: 15.00    Types: Cigarettes    Quit date: 11/07/2012    Years since quitting: 10.1   Smokeless tobacco: Never   Tobacco comments:    quit 5 yrs ago  Vaping Use   Vaping Use: Never used  Substance Use Topics   Alcohol use: No   Drug use: No    Family History  Problem Relation Age of Onset   Multiple sclerosis Father    Diabetes Mother     Objective:    Vitals:   There is no height or weight on file to calculate BMI.  Physical Exam Constitutional:      Appearance: Normal appearance.  HENT:     Head: Normocephalic and atraumatic.     Right Ear: Tympanic membrane normal.     Left Ear: Tympanic membrane normal.     Nose: Nose normal.     Mouth/Throat:     Mouth: Mucous membranes are moist.  Eyes:     Extraocular Movements: Extraocular movements intact.     Conjunctiva/sclera: Conjunctivae normal.     Pupils: Pupils are equal, round, and reactive to light.  Cardiovascular:     Rate and Rhythm: Normal rate and regular rhythm.     Heart sounds: No murmur heard.    No friction rub. No gallop.  Pulmonary:     Effort: Pulmonary effort is normal.  Breath sounds: Normal breath sounds.  Abdominal:     General: Abdomen is flat.     Palpations: Abdomen is soft.  Musculoskeletal:        General: Normal range of motion.  Skin:    General: Skin is warm and dry.  Neurological:     General: No focal deficit present.     Mental Status: She is alert and oriented to person, place, and time.  Psychiatric:        Mood and Affect: Mood normal.     Lab Results: Lab Results  Component Value Date   WBC 18.4 (H) 12/29/2022   HGB 7.9 (L) 12/29/2022   HCT 26.0 (L) 12/29/2022   MCV 69.5 (L) 12/29/2022   PLT 434 (H) 12/29/2022    Lab Results  Component Value Date   CREATININE 0.76 12/31/2022   BUN 9 12/31/2022   NA 136 12/31/2022   K 4.1 12/31/2022   CL 100 12/31/2022   CO2 26 12/31/2022    Lab Results  Component Value Date   ALT 11 12/29/2022   AST 14 (L) 12/29/2022   ALKPHOS 117 12/29/2022   BILITOT 0.3 12/29/2022     Assessment & Plan:  #Salmonella bacteremia #E. coli UTI #Perforated cholecystitis status post cholecystotomy tube placement on 1/22 #History of L spine's tumor status post resection leaving a paraplegic #Leukocytosis -Repeat CT on 1/24 showed decompressed gallbladder with thick walled and surrounding  fluid inflammatory findings and ascites similar to prior.  Mesenteric edema along perihepatic bursitis enthesitis tracking along paracolic gutter. -Clinically pt is doing well. She has outpatient appt with IR on 3/6 to evaluate choly tube -Surgery f/u for interval lap choly 6-8 weeks.  Plan: -D/C Levaquin and metronidazole -Start Augmentin(Salmonella amp sens) -Labs today cbc, cmp, esr, crp -Will f/u on 2/14 to review labs and discuss if antibiotics need to be continued.    Laurice Record, MD [facility]   01/08/23  3:54 PM

## 2023-01-09 LAB — CBC WITH DIFFERENTIAL/PLATELET
Absolute Monocytes: 706 cells/uL (ref 200–950)
Basophils Absolute: 123 cells/uL (ref 0–200)
Basophils Relative: 1.1 %
Eosinophils Absolute: 202 cells/uL (ref 15–500)
Eosinophils Relative: 1.8 %
HCT: 32 % — ABNORMAL LOW (ref 35.0–45.0)
Hemoglobin: 9.9 g/dL — ABNORMAL LOW (ref 11.7–15.5)
Lymphs Abs: 2666 cells/uL (ref 850–3900)
MCH: 21.5 pg — ABNORMAL LOW (ref 27.0–33.0)
MCHC: 30.9 g/dL — ABNORMAL LOW (ref 32.0–36.0)
MCV: 69.4 fL — ABNORMAL LOW (ref 80.0–100.0)
MPV: 8.9 fL (ref 7.5–12.5)
Monocytes Relative: 6.3 %
Neutro Abs: 7504 cells/uL (ref 1500–7800)
Neutrophils Relative %: 67 %
Platelets: 487 10*3/uL — ABNORMAL HIGH (ref 140–400)
RBC: 4.61 10*6/uL (ref 3.80–5.10)
RDW: 25.1 % — ABNORMAL HIGH (ref 11.0–15.0)
Total Lymphocyte: 23.8 %
WBC: 11.2 10*3/uL — ABNORMAL HIGH (ref 3.8–10.8)

## 2023-01-09 LAB — COMPLETE METABOLIC PANEL WITH GFR
AG Ratio: 1.2 (calc) (ref 1.0–2.5)
ALT: 8 U/L (ref 6–29)
AST: 10 U/L (ref 10–35)
Albumin: 4.1 g/dL (ref 3.6–5.1)
Alkaline phosphatase (APISO): 91 U/L (ref 31–125)
BUN/Creatinine Ratio: 17 (calc) (ref 6–22)
BUN: 19 mg/dL (ref 7–25)
CO2: 28 mmol/L (ref 20–32)
Calcium: 9.6 mg/dL (ref 8.6–10.2)
Chloride: 98 mmol/L (ref 98–110)
Creat: 1.13 mg/dL — ABNORMAL HIGH (ref 0.50–0.99)
Globulin: 3.3 g/dL (calc) (ref 1.9–3.7)
Glucose, Bld: 82 mg/dL (ref 65–99)
Potassium: 3.8 mmol/L (ref 3.5–5.3)
Sodium: 138 mmol/L (ref 135–146)
Total Bilirubin: 0.3 mg/dL (ref 0.2–1.2)
Total Protein: 7.4 g/dL (ref 6.1–8.1)
eGFR: 60 mL/min/{1.73_m2} (ref >=60)

## 2023-01-09 LAB — CBC MORPHOLOGY

## 2023-01-09 LAB — SEDIMENTATION RATE: Sed Rate: 53 mm/h — ABNORMAL HIGH (ref 0–20)

## 2023-01-09 LAB — C-REACTIVE PROTEIN: CRP: 6.3 mg/L (ref <8.0)

## 2023-01-10 DIAGNOSIS — K812 Acute cholecystitis with chronic cholecystitis: Secondary | ICD-10-CM | POA: Diagnosis not present

## 2023-01-17 ENCOUNTER — Telehealth (INDEPENDENT_AMBULATORY_CARE_PROVIDER_SITE_OTHER): Payer: Medicare HMO | Admitting: Internal Medicine

## 2023-01-17 ENCOUNTER — Other Ambulatory Visit: Payer: Self-pay

## 2023-01-17 DIAGNOSIS — K819 Cholecystitis, unspecified: Secondary | ICD-10-CM | POA: Diagnosis not present

## 2023-01-17 NOTE — Progress Notes (Addendum)
Subjective:    Patient ID: Debbie [name], female    DOB: 1976/01/08, 47 y.o.   MRN: [id number]  No chief complaint on file.    Virtual Visit via Telephone/Video Note   I connected withNAME@ on 01/17/2023 at 1:48 PM by telephone and verified that I am speaking with the correct person using two identifiers.   I discussed the limitations, risks, security and privacy concerns of performing an evaluation and management service by telephone and the availability of in person appointments. I also discussed with the patient that there may be a patient responsible charge related to this service. The patient expressed understanding and agreed to proceed.  Location:  Patient: Home Provider: [facility]   HPI: Debbie [name] is a 47 y.o. 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  Patient transferred from [facility] to [place] on 1/21 with Salmonella bacteremia and acute cholecystitis with worsening renal failure.-At OSH she had presented with 3-day history of nausea and vomiting.  Urine cultures grew E. coli.  Initially on ceftriaxone and metronidazole then transition to Levaquin given blood cultures returned positive for Salmonella.  She was found to have cholecystitis on imaging.  At our facility right upper quadrant ultrasound showed cholelithiasis and gallbladder sludge with additional concern for acute cholecystitis, concern for gallbladder perforation as well.  Radiology placed a cholecystostomy tube for perforated cholecystitis on 1/22, cultures with no growth.  General surgery plans on outpatient follow-up at 6 to 8 weeks.  ID engaged patient discharged on Levaquin and metronidazole given bacteremia secondary to gallbladder.  Repeat CT on 1/24 showed decompressed gallbladder with thick walled and  surrounding fluid inflammatory findings and ascites similar to prior.  Mesenteric edema along perihepatic bursitis enthesitis tracking along paracolic gutter. 01/08/23, pt reports she feels much better. Denies abdominal pain. She is adherent to levaquin and metronidazole.  01/17/23 today: Doing well, no new complaints.     Allergies  Allergen Reactions   Honey Bee Treatment [Bee Venom] Anaphylaxis and Itching   Baclofen     Headaches       Outpatient Medications Prior to Visit  Medication Sig Dispense Refill   amoxicillin-clavulanate (AUGMENTIN) 875-125 MG tablet Take 1 tablet by mouth 2 (two) times daily. 30 tablet 0   atorvastatin (LIPITOR) 20 MG tablet Take 20 mg by mouth daily.     chlorthalidone (HYGROTON) 25 MG tablet Take 25 mg by mouth daily.     gabapentin (NEURONTIN) 300 MG capsule take 2 capsules by mouth three times a day (Patient taking differently: Take 600 mg by mouth 3 (three) times daily.) 180 capsule 1   insulin glargine (LANTUS SOLOSTAR) 100 UNIT/ML Solostar Pen Inject 24 Units into the skin daily. (Patient not taking: Reported on 01/08/2023)     lisinopril (PRINIVIL,ZESTRIL) 40 MG tablet Take 1 tablet (40 mg total) by mouth daily. 30 tablet 1   meloxicam (MOBIC) 15 MG tablet Take 1 tablet (15 mg total) by mouth daily. (Patient not taking: Reported on 01/08/2023) 30 tablet 4   metFORMIN (GLUCOPHAGE) 1000 MG tablet Take 1,000 mg by mouth 2 (two) times daily with a meal.     norethindrone (MICRONOR) 0.35 MG tablet TAKE 1 TABLET(0.35 MG) BY MOUTH DAILY (Patient not taking: Reported on 12/24/2022) 28 tablet 11   ondansetron (ZOFRAN-ODT) 4 MG disintegrating tablet Take 1 tablet (4  mg total) by mouth every 8 (eight) hours as needed for nausea or vomiting. 20 tablet 0   pantoprazole (PROTONIX) 40 MG tablet Take 1 tablet (40 mg total) by mouth daily. 30 tablet 5   sodium chloride flush (NS) 0.9 % SOLN Use 5 mLs by Intracatheter route daily. 300 mL 0   tiZANidine (ZANAFLEX) 4 MG tablet  TAKE 1.5 TABLETS BY MOUTH 4 TIMES DAILY. (Patient taking differently: Take 4 mg by mouth 4 (four) times daily.) 180 tablet 5   Facility-Administered Medications Prior to Visit  Medication Dose Route Frequency Provider Last Rate Last Admin   incobotulinumtoxinA (XEOMIN) 100 units injection 100 Units  100 Units Intramuscular Q90 days Marcial Pacas, MD   100 Units at 08/22/17 1550     Past Medical History:  Diagnosis Date   Chronic myofascial pain    Chronic pain    Fibroid 10/20/2021   Hypertension    Mass    T5-T6 intradural mass   Neurogenic bladder    and bowel   Spastic paraplegia      Past Surgical History:  Procedure Laterality Date   CESAREAN SECTION     FRACTURE SURGERY     IR CHOLANGIOGRAM EXISTING TUBE  12/29/2022   IR PERC CHOLECYSTOSTOMY  12/25/2022   LAMINECTOMY  11/11/2012   Procedure: THORACIC LAMINECTOMY FOR TUMOR;  Surgeon: Charlie Pitter, MD;  Location: [facility];  Service: Neurosurgery;  Laterality: N/A;  thracic laminectomy for intradural and intramedullary neoplasm   SPINE SURGERY         Review of Systems    Objective:    Nursing note and vital signs reviewed.     Assessment & Plan:  #Salmonella bacteremia #E. coli UTI #Perforated cholecystitis status post cholecystotomy tube placement on 1/22 #History of L spine's tumor status post resection leaving a paraplegic #Leukocytosis -Repeat CT on 1/24 showed decompressed gallbladder with thick walled and surrounding fluid inflammatory findings and ascites similar to prior.  Mesenteric edema along perihepatic bursitis enthesitis tracking along paracolic gutter. -Clinically pt is doing well. She has outpatient appt with IR on 3/6 to evaluate choly tube -Surgery f/u for interval lap choly 6-8 weeks. - Minimal drainage from tube.Pt is tolerating antibiotics as such will continue till IR F/U and final surgical plan.  Plan: -Continue Augmentin(Salmonella amp sens) -3/11 radiology then decide on general   General Surgery f/u -F/U with ID on 3/13     Laurice Record, MD [place] [facility] 01/17/2023, 5:52 AM   I have personally spent 32 minutes involved in face-to-face and non-face-to-face activities for this patient on the day of the visit.

## 2023-01-23 ENCOUNTER — Other Ambulatory Visit: Payer: Self-pay

## 2023-01-23 ENCOUNTER — Telehealth: Payer: Self-pay

## 2023-01-23 DIAGNOSIS — K819 Cholecystitis, unspecified: Secondary | ICD-10-CM

## 2023-01-23 MED ORDER — AMOXICILLIN-POT CLAVULANATE 875-125 MG PO TABS: 1.0000 | ORAL_TABLET | Freq: Two times a day (BID) | ORAL | 0 refills | Status: DC

## 2023-01-23 NOTE — Telephone Encounter (Signed)
Patient called office stating she needs refill on Amoxicillin. States she does not have enough on hand to get through 3/13. Refill sent [facility]. Leatrice Jewels, RMA

## 2023-01-26 ENCOUNTER — Telehealth: Payer: Self-pay | Admitting: Student

## 2023-01-26 ENCOUNTER — Other Ambulatory Visit: Payer: Self-pay | Admitting: Student

## 2023-01-26 DIAGNOSIS — T85528A Displacement of other gastrointestinal prosthetic devices, implants and grafts, initial encounter: Secondary | ICD-10-CM

## 2023-01-26 NOTE — Telephone Encounter (Signed)
Patient called with concern of a displaced percutaneous cholecystostomy drain. She states she accidentally pulled the drain out by about an inch, but that the suture is in place. She reports that the drain is still functioning with bilious output, but reports a slight decrease in output. She also notes that when she flushes the drain with saline, a small amount of saline leaks from the skin site. She denies fever, chills, abdominal pain, and pain at the site. The case was discussed with Dr Denna Haggard who recommended that the patient be seen on Monday 2/26 for drain injection with possible replacement. This plan was verbalized to the patient who was in agreement with said plan. Patient was given instructions to call weekend IR APP team or present to ED if she develops fever, chills, abdominal pain, or if her drain stops producing output. The patient voiced her understanding and willingness to comply. Patient scheduled for 10 AM appointment on Monday 01/29/23.

## 2023-01-29 ENCOUNTER — Ambulatory Visit (HOSPITAL_COMMUNITY)
Admission: RE | Admit: 2023-01-29 | Discharge: 2023-01-29 | Disposition: A | Discharge status: Home / Self Care | Payer: Medicare HMO | Source: Ambulatory Visit | Attending: Student | Admitting: Student

## 2023-01-29 ENCOUNTER — Other Ambulatory Visit: Payer: Self-pay | Admitting: Student

## 2023-01-29 DIAGNOSIS — Y849 Medical procedure, unspecified as the cause of abnormal reaction of the patient, or of later complication, without mention of misadventure at the time of the procedure: Secondary | ICD-10-CM | POA: Insufficient documentation

## 2023-01-29 DIAGNOSIS — T85528A Displacement of other gastrointestinal prosthetic devices, implants and grafts, initial encounter: Secondary | ICD-10-CM

## 2023-01-29 DIAGNOSIS — T85520A Displacement of bile duct prosthesis, initial encounter: Secondary | ICD-10-CM | POA: Diagnosis not present

## 2023-01-29 HISTORY — PX: IR EXCHANGE BILIARY DRAIN: IMG6046

## 2023-01-29 MED ORDER — IOHEXOL 300 MG/ML  SOLN
50.0000 mL | Freq: Once | INTRAMUSCULAR | Status: AC | PRN
  Administered 2023-01-29: 20 mL

## 2023-01-29 MED ORDER — LIDOCAINE HCL 1 % IJ SOLN
INTRAMUSCULAR | Status: AC
  Filled 2023-01-29: qty 20

## 2023-02-12 ENCOUNTER — Other Ambulatory Visit: Payer: Self-pay | Admitting: Physician Assistant

## 2023-02-12 ENCOUNTER — Ambulatory Visit (HOSPITAL_COMMUNITY)
Admission: RE | Admit: 2023-02-12 | Discharge: 2023-02-12 | Disposition: A | Discharge status: Home / Self Care | Payer: Medicare HMO | Source: Ambulatory Visit | Attending: Physician Assistant | Admitting: Physician Assistant

## 2023-02-12 DIAGNOSIS — K810 Acute cholecystitis: Secondary | ICD-10-CM | POA: Diagnosis not present

## 2023-02-12 DIAGNOSIS — Z434 Encounter for attention to other artificial openings of digestive tract: Secondary | ICD-10-CM | POA: Diagnosis not present

## 2023-02-12 HISTORY — PX: IR CHOLANGIOGRAM EXISTING TUBE: IMG6040

## 2023-02-12 MED ORDER — IOHEXOL 300 MG/ML  SOLN
50.0000 mL | Freq: Once | INTRAMUSCULAR | Status: AC | PRN
  Administered 2023-02-12: 30 mL

## 2023-02-14 ENCOUNTER — Inpatient Hospital Stay: Payer: Medicare HMO | Admitting: Internal Medicine

## 2023-02-15 DIAGNOSIS — E1169 Type 2 diabetes mellitus with other specified complication: Secondary | ICD-10-CM | POA: Diagnosis not present

## 2023-02-15 DIAGNOSIS — E669 Obesity, unspecified: Secondary | ICD-10-CM | POA: Diagnosis not present

## 2023-02-15 DIAGNOSIS — K8010 Calculus of gallbladder with chronic cholecystitis without obstruction: Secondary | ICD-10-CM | POA: Diagnosis not present

## 2023-02-15 DIAGNOSIS — G8220 Paraplegia, unspecified: Secondary | ICD-10-CM | POA: Diagnosis not present

## 2023-02-15 DIAGNOSIS — Z434 Encounter for attention to other artificial openings of digestive tract: Secondary | ICD-10-CM | POA: Diagnosis not present

## 2023-02-15 DIAGNOSIS — Z87898 Personal history of other specified conditions: Secondary | ICD-10-CM | POA: Diagnosis not present

## 2023-02-19 ENCOUNTER — Ambulatory Visit: Payer: Self-pay | Admitting: General Surgery

## 2023-02-19 DIAGNOSIS — K810 Acute cholecystitis: Secondary | ICD-10-CM

## 2023-02-19 MED ORDER — SPY AGENT GREEN - (INDOCYANINE FOR INJECTION): 1.2500 mg | Freq: Once | INTRAMUSCULAR | Status: AC

## 2023-02-19 NOTE — Progress Notes (Signed)
Sent message, via epic in basket, requesting orders in epic from surgeon.  

## 2023-02-21 ENCOUNTER — Other Ambulatory Visit: Payer: Self-pay

## 2023-02-21 ENCOUNTER — Encounter: Payer: Self-pay | Admitting: Internal Medicine

## 2023-02-21 ENCOUNTER — Ambulatory Visit (INDEPENDENT_AMBULATORY_CARE_PROVIDER_SITE_OTHER): Payer: Medicare HMO | Admitting: Internal Medicine

## 2023-02-21 VITALS — BP 128/81 | HR 65 | Temp 97.7°F

## 2023-02-21 DIAGNOSIS — K819 Cholecystitis, unspecified: Secondary | ICD-10-CM | POA: Diagnosis not present

## 2023-02-21 NOTE — Progress Notes (Unsigned)
Patient: Debbie [name]  DOB: 1976/06/30 MRN: [id number] PCP: Neale Burly, MD    Chief Complaint  Patient presents with   Follow-up     Patient Active Problem List   Diagnosis Date Noted   Salmonella bacteremia 12/28/2022   AKI (acute kidney injury) ([place]) 12/28/2022   Acute cholecystitis 12/24/2022   Pressure injury of skin 12/24/2022   Intractable pain 12/24/2022   Fibroid 10/20/2021   Abnormal CT scan, pelvis 10/13/2021   Encounter for cervical Pap smear with pelvic exam 10/13/2021   CAP (community acquired pneumonia) 11/26/2013   Tendonitis of elbow, left 05/26/2013   Neurogenic bladder 01/29/2013   Neurogenic bowel 01/29/2013   GERD (gastroesophageal reflux disease) 01/29/2013   Myalgia and myositis, unspecified 12/24/2012   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  Patient transferred from [facility] to [place] on 1/21 with Salmonella bacteremia and acute cholecystitis with worsening renal failure.-At OSH she had presented with 3-day history of nausea and vomiting.  Urine cultures grew E. coli.  Initially on ceftriaxone and metronidazole then transition to Levaquin given blood cultures returned positive for Salmonella.  She was found to have cholecystitis on imaging.  At our facility right upper quadrant ultrasound showed cholelithiasis and gallbladder sludge with additional concern for  acute cholecystitis, concern for gallbladder perforation as well.  Radiology placed a cholecystostomy tube for perforated cholecystitis on 1/22, cultures with no growth.  General surgery plans on outpatient follow-up at 6 to 8 weeks.  ID engaged patient discharged on Levaquin and metronidazole given bacteremia secondary to gallbladder.  Repeat CT on 1/24 showed decompressed gallbladder with thick walled and surrounding fluid inflammatory findings and ascites similar to prior.  Mesenteric edema along perihepatic bursitis enthesitis tracking along paracolic gutter. 01/08/23, pt reports she feels much better. Denies abdominal pain. She is adherent to levaquin and metronidazole.  01/17/23 Video visit today: Doing well, no new complaints.  Today 02/21/23: No new complaints. Lap choly planned on 4/1 per pt. She ran out of abx 2 weeks ago. No concerns.  Review of Systems  All other systems reviewed and are negative.   Past Medical History:  Diagnosis Date   Chronic myofascial pain    Chronic pain    Fibroid 10/20/2021   Hypertension    Mass    T5-T6 intradural mass   Neurogenic bladder    and bowel   Spastic paraplegia     Outpatient Medications Prior to Visit  Medication Sig Dispense Refill   acetaminophen (TYLENOL) 500 MG tablet Take 500-1,500 mg by mouth every 8 (eight) hours as needed for moderate pain.     Aspirin-Acetaminophen-Caffeine (GOODY HEADACHE PO) Take 1-2 packets by mouth daily as needed (pain).     gabapentin (NEURONTIN) 300 MG capsule take 2 capsules by mouth three times a day (Patient taking differently: Take 600 mg by mouth 3 (three) times daily.) 180 capsule 1   lisinopril (ZESTRIL) 20 MG tablet  Take 20 mg by mouth daily.     metFORMIN (GLUCOPHAGE) 1000 MG tablet Take 1,000 mg by mouth 2 (two) times daily with a meal.     omeprazole (PRILOSEC) 20 MG capsule Take 20 mg by mouth daily.     rosuvastatin (CRESTOR) 10 MG tablet Take 10 mg by mouth daily.     sodium chloride flush  (NS) 0.9 % SOLN Use 5 mLs by Intracatheter route daily. 300 mL 0   tiZANidine (ZANAFLEX) 4 MG tablet TAKE 1.5 TABLETS BY MOUTH 4 TIMES DAILY. 180 tablet 5   amoxicillin-clavulanate (AUGMENTIN) 875-125 MG tablet Take 1 tablet by mouth 2 (two) times daily. (Patient not taking: Reported on 02/21/2023) 30 tablet 0   norethindrone (MICRONOR) 0.35 MG tablet TAKE 1 TABLET(0.35 MG) BY MOUTH DAILY (Patient not taking: Reported on 12/24/2022) 28 tablet 11   ondansetron (ZOFRAN-ODT) 4 MG disintegrating tablet Take 1 tablet (4 mg total) by mouth every 8 (eight) hours as needed for nausea or vomiting. (Patient not taking: Reported on 02/21/2023) 20 tablet 0   pantoprazole (PROTONIX) 40 MG tablet Take 1 tablet (40 mg total) by mouth daily. (Patient not taking: Reported on 02/21/2023) 30 tablet 5   Facility-Administered Medications Prior to Visit  Medication Dose Route Frequency Provider Last Rate Last Admin   incobotulinumtoxinA (XEOMIN) 100 units injection 100 Units  100 Units Intramuscular Q90 days Marcial Pacas, MD   100 Units at 08/22/17 1550   Spy Agent Green / Firefly Optime  1.25 mg Intravenous Once Greer Pickerel, MD         Allergies  Allergen Reactions   Honey Bee Treatment [Bee Venom] Anaphylaxis and Itching   Baclofen     Headaches     Social History   Tobacco Use   Smoking status: Former    Packs/day: 1.50    Years: 10.00    Additional pack years: 0.00    Total pack years: 15.00    Types: Cigarettes    Quit date: 11/07/2012    Years since quitting: 10.2   Smokeless tobacco: Never   Tobacco comments:    quit 5 yrs ago  Vaping Use   Vaping Use: Never used  Substance Use Topics   Alcohol use: No   Drug use: No    Family History  Problem Relation Age of Onset   Multiple sclerosis Father    Diabetes Mother     Objective:   Vitals:   02/21/23 1605  BP: 128/81  Pulse: 65  Temp: 97.7 F (36.5 C)  TempSrc: Temporal  SpO2: 98%   There is no height or weight on file to calculate  BMI.  Physical Exam Constitutional:      Appearance: Normal appearance.  HENT:     Head: Normocephalic and atraumatic.     Right Ear: Tympanic membrane normal.     Left Ear: Tympanic membrane normal.     Nose: Nose normal.     Mouth/Throat:     Mouth: Mucous membranes are moist.  Eyes:     Extraocular Movements: Extraocular movements intact.     Conjunctiva/sclera: Conjunctivae normal.     Pupils: Pupils are equal, round, and reactive to light.  Cardiovascular:     Rate and Rhythm: Normal rate and regular rhythm.     Heart sounds: No murmur heard.    No friction rub. No gallop.  Pulmonary:     Effort: Pulmonary effort is normal.     Breath sounds: Normal breath sounds.  Abdominal:  General: Abdomen is flat.     Palpations: Abdomen is soft.  Musculoskeletal:        General: Normal range of motion.  Skin:    General: Skin is warm and dry.  Neurological:     General: No focal deficit present.     Mental Status: She is alert and oriented to person, place, and time.  Psychiatric:        Mood and Affect: Mood normal.     Lab Results: Lab Results  Component Value Date   WBC 11.2 (H) 01/08/2023   HGB 9.9 (L) 01/08/2023   HCT 32.0 (L) 01/08/2023   MCV 69.4 (L) 01/08/2023   PLT 487 (H) 01/08/2023    Lab Results  Component Value Date   CREATININE 1.13 (H) 01/08/2023   BUN 19 01/08/2023   NA 138 01/08/2023   K 3.8 01/08/2023   CL 98 01/08/2023   CO2 28 01/08/2023    Lab Results  Component Value Date   ALT 8 01/08/2023   AST 10 01/08/2023   ALKPHOS 117 12/29/2022   BILITOT 0.3 01/08/2023     Assessment & Plan:  #Salmonella bacteremia #E. coli UTI #Perforated cholecystitis status post cholecystotomy tube placement on 1/22 #History of L spine's tumor status post resection leaving a paraplegic #Leukocytosis -Repeat CT on 1/24 showed decompressed gallbladder with thick walled and surrounding fluid inflammatory findings and ascites similar to prior.  Mesenteric  edema along perihepatic bursitis enthesitis tracking along paracolic gutter. -Clinically pt is doing well. She has outpatient appt with IR on 3/6 to evaluate choly tube -Surgery f/u for interval lap choly 6-8 weeks. - Minimal drainage from tube.Pt is tolerating antibiotics as such will continue till IR F/U and final surgical plan.  -Ran out of abx 2 weeks ago. No concerns Plan: -Will do labs. If stable then leave off abx -F/U on 4/8 after OR on 4/1 at [facility] fo lap choly  Laurice Record, MD [facility] [place] [facility]   02/21/23  4:17 PM    I have personally spent 65 minutes involved in face-to-face and non-face-to-face activities for this patient on the day of the visit. Professional time spent includes the following activities: Preparing to see the patient (review of tests), Obtaining and/or reviewing separately obtained history (admission/discharge record), Performing a medically appropriate examination and/or evaluation , Ordering medications/tests/procedures, referring and communicating with other health care professionals, Documenting clinical information in the EMR, Independently interpreting results (not separately reported), Communicating results to the patient/family/caregiver, Counseling and educating the patient/family/caregiver and Care coordination (not separately reported).

## 2023-02-22 LAB — CBC WITH DIFFERENTIAL/PLATELET
Absolute Monocytes: 561 cells/uL (ref 200–950)
Basophils Absolute: 57 cells/uL (ref 0–200)
Basophils Relative: 0.6 %
Eosinophils Absolute: 228 cells/uL (ref 15–500)
Eosinophils Relative: 2.4 %
HCT: 33.4 % — ABNORMAL LOW (ref 35.0–45.0)
Hemoglobin: 10.4 g/dL — ABNORMAL LOW (ref 11.7–15.5)
Lymphs Abs: 2974 cells/uL (ref 850–3900)
MCH: 24 pg — ABNORMAL LOW (ref 27.0–33.0)
MCHC: 31.1 g/dL — ABNORMAL LOW (ref 32.0–36.0)
MCV: 77 fL — ABNORMAL LOW (ref 80.0–100.0)
MPV: 9.1 fL (ref 7.5–12.5)
Monocytes Relative: 5.9 %
Neutro Abs: 5681 cells/uL (ref 1500–7800)
Neutrophils Relative %: 59.8 %
Platelets: 372 10*3/uL (ref 140–400)
RBC: 4.34 10*6/uL (ref 3.80–5.10)
RDW: 23.5 % — ABNORMAL HIGH (ref 11.0–15.0)
Total Lymphocyte: 31.3 %
WBC: 9.5 10*3/uL (ref 3.8–10.8)

## 2023-02-22 LAB — CBC MORPHOLOGY

## 2023-02-22 LAB — COMPLETE METABOLIC PANEL WITH GFR
AG Ratio: 1.8 (calc) (ref 1.0–2.5)
ALT: 14 U/L (ref 6–29)
AST: 9 U/L — ABNORMAL LOW (ref 10–35)
Albumin: 4.6 g/dL (ref 3.6–5.1)
Alkaline phosphatase (APISO): 88 U/L (ref 31–125)
BUN: 16 mg/dL (ref 7–25)
CO2: 24 mmol/L (ref 20–32)
Calcium: 9.7 mg/dL (ref 8.6–10.2)
Chloride: 105 mmol/L (ref 98–110)
Creat: 0.6 mg/dL (ref 0.50–0.99)
Globulin: 2.5 g/dL (calc) (ref 1.9–3.7)
Glucose, Bld: 104 mg/dL — ABNORMAL HIGH (ref 65–99)
Potassium: 4.3 mmol/L (ref 3.5–5.3)
Sodium: 140 mmol/L (ref 135–146)
Total Bilirubin: 0.4 mg/dL (ref 0.2–1.2)
Total Protein: 7.1 g/dL (ref 6.1–8.1)
eGFR: 111 mL/min/{1.73_m2} (ref >=60)

## 2023-02-22 LAB — SEDIMENTATION RATE: Sed Rate: 31 mm/h — ABNORMAL HIGH (ref 0–20)

## 2023-02-22 LAB — C-REACTIVE PROTEIN: CRP: 3.3 mg/L (ref <8.0)

## 2023-02-22 NOTE — Patient Instructions (Addendum)
DUE TO COVID-19 ONLY TWO VISITORS  (aged 48 and older)  ARE ALLOWED TO COME WITH YOU AND STAY IN THE WAITING ROOM ONLY DURING PRE OP AND PROCEDURE.   **NO VISITORS ARE ALLOWED IN THE SHORT STAY AREA OR RECOVERY ROOM!!**  IF YOU WILL BE ADMITTED INTO THE HOSPITAL YOU ARE ALLOWED ONLY FOUR SUPPORT PEOPLE DURING VISITATION HOURS ONLY (7 AM -8PM)   The support person(s) must pass our screening, gel in and out, and wear a mask at all times, including in the patient's room. Patients must also wear a mask when staff or their support person are in the room. Visitors GUEST BADGE MUST BE WORN VISIBLY  One adult visitor may remain with you overnight and MUST be in the room by 8 P.M.     Your procedure is scheduled on: 03/05/23   Report to [facility] Main Entrance    Report to admitting at  10:45 AM   Call this number if you have problems the morning of surgery [phone]   Do not eat food :After Midnight.   After Midnight you may have the following liquids until __10:00_AM/  DAY OF SURGERY  Water Black Coffee (sugar ok, NO MILK/CREAM OR CREAMERS)  Tea (sugar ok, NO MILK/CREAM OR CREAMERS) regular and decaf                             Plain Jell-O (NO RED)                                           Fruit ices (not with fruit pulp, NO RED)                                     Popsicles (NO RED)                                                                  Juice: apple, WHITE grape, WHITE cranberry Sports drinks like Gatorade (NO RED)                        If you have questions, please contact your surgeon's office.    Oral Hygiene is also important to reduce your risk of infection.                                    Remember - BRUSH YOUR TEETH THE MORNING OF SURGERY WITH YOUR REGULAR TOOTHPASTE  DENTURES WILL BE REMOVED PRIOR TO SURGERY PLEASE DO NOT APPLY "Poly grip" OR ADHESIVES!!!   Do NOT smoke after Midnight   Take these medicines the morning of surgery with A SIP OF  WATER: Gabapentin  Rosuvastatin                                                                                                                           Omeprazole   Bring CPAP mask and tubing day of surgery.                              You may not have any metal on your body including hair pins, jewelry, and body piercing             Do not wear make-up, lotions, powders, perfumes or deodorant  Do not wear nail polish including gel and S&S, artificial/acrylic nails, or any other type of covering on natural nails including finger and toenails. If you have artificial nails, gel coating, etc. that needs to be removed by a nail salon please have this removed prior to surgery or surgery may need to be canceled/ delayed if the surgeon/ anesthesia feels like they are unable to be safely monitored.   Do not shave  48 hours prior to surgery.     Do not bring valuables to the hospital. [place].   Contacts, glasses, or bridgework may not be worn into surgery.   Bring small overnight bag day of surgery.   DO NOT [place]. PHARMACY WILL DISPENSE MEDICATIONS LISTED ON YOUR MEDICATION LIST TO YOU DURING YOUR ADMISSION [place]!       Special Instructions: Bring a copy of your healthcare power of attorney and living will documents the day of surgery if you haven't scanned them before.              Please read over the following fact sheets you were given: IF YOU HAVE QUESTIONS ABOUT YOUR PRE-OP INSTRUCTIONS PLEASE CALL [phone]    [facility] - Preparing for Surgery Before surgery, you can play an important role.  Because skin is not sterile, your skin needs to be as free of germs as possible.  You can reduce the number of germs on your skin by washing with CHG (chlorahexidine  gluconate) soap before surgery.  CHG is an antiseptic cleaner which kills germs and bonds with the skin to continue killing germs even after washing. Please DO NOT use if you have an allergy to CHG or antibacterial soaps.  If your skin becomes reddened/irritated stop using the CHG and inform your nurse when you arrive at Short Stay. Do not shave (including legs and underarms) for at least 48 hours prior to the first CHG shower.  You may shave your face/neck. Please follow these instructions carefully:  1.  Shower with CHG Soap the night before surgery and the  morning of Surgery.  2.  If you choose to wash your hair, wash your hair first as usual with  your  normal  shampoo.  3.  After you shampoo, rinse your hair and body thoroughly to remove the  shampoo.                            4.  Use CHG as you would any other liquid soap.  You can apply chg directly  to the skin and wash                       Gently with a scrungie or clean washcloth.  5.  Apply the CHG Soap to your body ONLY FROM THE NECK DOWN.   Do not use on face/ open                           Wound or open sores. Avoid contact with eyes, ears mouth and genitals (private parts).                       Wash face,  Genitals (private parts) with your normal soap.             6.  Wash thoroughly, paying special attention to the area where your surgery  will be performed.  7.  Thoroughly rinse your body with warm water from the neck down.  8.  DO NOT shower/wash with your normal soap after using and rinsing off  the CHG Soap.             9.  Pat yourself dry with a clean towel.            10.  Wear clean pajamas.            11.  Place clean sheets on your bed the night of your first shower and do not  sleep with pets. Day of Surgery : Do not apply any lotions/deodorants the morning of surgery.  Please wear clean clothes to the hospital/surgery center.  FAILURE TO FOLLOW THESE INSTRUCTIONS MAY RESULT IN THE CANCELLATION OF YOUR  SURGERY  PATIENT SIGNATURE_________________________________  ________________________________________________________________________

## 2023-02-26 ENCOUNTER — Other Ambulatory Visit: Payer: Self-pay

## 2023-02-26 ENCOUNTER — Encounter (HOSPITAL_COMMUNITY)
Admission: RE | Admit: 2023-02-26 | Discharge: 2023-02-26 | Disposition: A | Discharge status: Home / Self Care | Payer: Medicare HMO | Source: Ambulatory Visit | Attending: General Surgery | Admitting: General Surgery

## 2023-02-26 ENCOUNTER — Encounter (HOSPITAL_COMMUNITY): Payer: Self-pay

## 2023-02-26 DIAGNOSIS — Z01818 Encounter for other preprocedural examination: Secondary | ICD-10-CM | POA: Insufficient documentation

## 2023-02-26 DIAGNOSIS — K810 Acute cholecystitis: Secondary | ICD-10-CM

## 2023-02-26 DIAGNOSIS — I10 Essential (primary) hypertension: Secondary | ICD-10-CM | POA: Diagnosis not present

## 2023-02-26 NOTE — Progress Notes (Signed)
Anesthesia note:    PCP - Dr. Nemiah Commander Cardiologist -none Other-   Chest x-ray - no EKG - 02/26/23-chart Stress Test - no ECHO - 12/25/22-epic Cardiac Cath - no CABG-no Pacemaker/ICD device last checked:NA  Sleep Study - no CPAP -   Pt is pre diabetic-no CBG at PAT visit- Fasting Blood Sugar at home- Checks Blood Sugar _____  Blood Thinner:no Blood Thinner Instructions: Aspirin Instructions: Last Dose:  Anesthesia review: Yes  reason:  Patient denies shortness of breath, fever, cough and chest pain at PAT appointment. Pt has been Paraplegic since 2013. She has a neurogenic bowel and bladder. She has a drain in her gallbladder. 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   Patient verbalized understanding of instructions that were given to them at the PAT appointment. Patient was also instructed that they will need to review over the PAT instructions again at home before surgery.yes

## 2023-03-05 ENCOUNTER — Observation Stay (HOSPITAL_COMMUNITY)
Admission: RE | Admit: 2023-03-05 | Discharge: 2023-03-06 | Disposition: A | Discharge status: Home / Self Care | Payer: Medicare HMO | Attending: General Surgery | Admitting: General Surgery

## 2023-03-05 ENCOUNTER — Encounter (HOSPITAL_COMMUNITY)
Admission: RE | Disposition: A | Discharge status: Home / Self Care | Payer: Self-pay | Source: Home / Self Care | Attending: General Surgery

## 2023-03-05 ENCOUNTER — Ambulatory Visit (HOSPITAL_COMMUNITY): Payer: Medicare HMO | Admitting: Physician Assistant

## 2023-03-05 ENCOUNTER — Ambulatory Visit (HOSPITAL_BASED_OUTPATIENT_CLINIC_OR_DEPARTMENT_OTHER): Payer: Medicare HMO | Admitting: Anesthesiology

## 2023-03-05 ENCOUNTER — Encounter (HOSPITAL_COMMUNITY): Payer: Self-pay | Admitting: General Surgery

## 2023-03-05 ENCOUNTER — Other Ambulatory Visit: Payer: Self-pay

## 2023-03-05 DIAGNOSIS — E669 Obesity, unspecified: Secondary | ICD-10-CM | POA: Diagnosis not present

## 2023-03-05 DIAGNOSIS — Z9049 Acquired absence of other specified parts of digestive tract: Secondary | ICD-10-CM

## 2023-03-05 DIAGNOSIS — Z6834 Body mass index (BMI) 34.0-34.9, adult: Secondary | ICD-10-CM

## 2023-03-05 DIAGNOSIS — E119 Type 2 diabetes mellitus without complications: Secondary | ICD-10-CM | POA: Diagnosis not present

## 2023-03-05 DIAGNOSIS — K8010 Calculus of gallbladder with chronic cholecystitis without obstruction: Secondary | ICD-10-CM | POA: Diagnosis not present

## 2023-03-05 DIAGNOSIS — Z7984 Long term (current) use of oral hypoglycemic drugs: Secondary | ICD-10-CM | POA: Diagnosis not present

## 2023-03-05 DIAGNOSIS — R7303 Prediabetes: Secondary | ICD-10-CM

## 2023-03-05 DIAGNOSIS — K8012 Calculus of gallbladder with acute and chronic cholecystitis without obstruction: Secondary | ICD-10-CM | POA: Diagnosis not present

## 2023-03-05 DIAGNOSIS — G8220 Paraplegia, unspecified: Secondary | ICD-10-CM | POA: Insufficient documentation

## 2023-03-05 DIAGNOSIS — I10 Essential (primary) hypertension: Secondary | ICD-10-CM

## 2023-03-05 DIAGNOSIS — Z79899 Other long term (current) drug therapy: Secondary | ICD-10-CM | POA: Insufficient documentation

## 2023-03-05 DIAGNOSIS — Z87891 Personal history of nicotine dependence: Secondary | ICD-10-CM

## 2023-03-05 DIAGNOSIS — K8000 Calculus of gallbladder with acute cholecystitis without obstruction: Secondary | ICD-10-CM | POA: Diagnosis not present

## 2023-03-05 DIAGNOSIS — E785 Hyperlipidemia, unspecified: Secondary | ICD-10-CM | POA: Diagnosis not present

## 2023-03-05 DIAGNOSIS — I119 Hypertensive heart disease without heart failure: Secondary | ICD-10-CM | POA: Diagnosis not present

## 2023-03-05 HISTORY — PX: CHOLECYSTECTOMY: SHX55

## 2023-03-05 LAB — GLUCOSE, CAPILLARY
Glucose-Capillary: 114 mg/dL — ABNORMAL HIGH (ref 70–99)
Glucose-Capillary: 131 mg/dL — ABNORMAL HIGH (ref 70–99)
Glucose-Capillary: 179 mg/dL — ABNORMAL HIGH (ref 70–99)

## 2023-03-05 SURGERY — LAPAROSCOPIC CHOLECYSTECTOMY WITH INTRAOPERATIVE CHOLANGIOGRAM
Anesthesia: General

## 2023-03-05 MED ORDER — HYDRALAZINE HCL 20 MG/ML IJ SOLN
5.0000 mg | Freq: Once | INTRAMUSCULAR | Status: AC
  Administered 2023-03-05: 5 mg via INTRAVENOUS

## 2023-03-05 MED ORDER — ONDANSETRON HCL 4 MG/2ML IJ SOLN
INTRAMUSCULAR | Status: AC
  Filled 2023-03-05: qty 2

## 2023-03-05 MED ORDER — ONDANSETRON 4 MG PO TBDP: 4.0000 mg | ORAL_TABLET | Freq: Four times a day (QID) | ORAL | Status: DC | PRN

## 2023-03-05 MED ORDER — ORAL CARE MOUTH RINSE: 15.0000 mL | Freq: Once | OROMUCOSAL | Status: AC

## 2023-03-05 MED ORDER — LACTATED RINGERS IV SOLN
INTRAVENOUS | Status: AC | PRN
  Administered 2023-03-05: 1000 mL

## 2023-03-05 MED ORDER — CHLORHEXIDINE GLUCONATE CLOTH 2 % EX PADS: 6.0000 | MEDICATED_PAD | Freq: Once | CUTANEOUS | Status: DC

## 2023-03-05 MED ORDER — ONDANSETRON HCL 4 MG/2ML IJ SOLN
INTRAMUSCULAR | Status: DC | PRN
  Administered 2023-03-05: 4 mg via INTRAVENOUS

## 2023-03-05 MED ORDER — PHENYLEPHRINE HCL (PRESSORS) 10 MG/ML IV SOLN
INTRAVENOUS | Status: DC | PRN
  Administered 2023-03-05: 80 ug via INTRAVENOUS
  Administered 2023-03-05: 160 ug via INTRAVENOUS
  Administered 2023-03-05: 80 ug via INTRAVENOUS
  Administered 2023-03-05: 160 ug via INTRAVENOUS

## 2023-03-05 MED ORDER — ONDANSETRON HCL 4 MG/2ML IJ SOLN: 4.0000 mg | Freq: Four times a day (QID) | INTRAMUSCULAR | Status: DC | PRN

## 2023-03-05 MED ORDER — ENOXAPARIN SODIUM 40 MG/0.4ML IJ SOSY
40.0000 mg | PREFILLED_SYRINGE | INTRAMUSCULAR | Status: DC
  Administered 2023-03-06: 40 mg via SUBCUTANEOUS
  Filled 2023-03-05: qty 0.4

## 2023-03-05 MED ORDER — EPHEDRINE 5 MG/ML INJ
INTRAVENOUS | Status: AC
  Filled 2023-03-05: qty 5

## 2023-03-05 MED ORDER — LABETALOL HCL 5 MG/ML IV SOLN
INTRAVENOUS | Status: DC | PRN
  Administered 2023-03-05: 10 mg via INTRAVENOUS

## 2023-03-05 MED ORDER — PANTOPRAZOLE SODIUM 40 MG IV SOLR
40.0000 mg | Freq: Every day | INTRAVENOUS | Status: DC
  Administered 2023-03-05: 40 mg via INTRAVENOUS
  Filled 2023-03-05: qty 10

## 2023-03-05 MED ORDER — ROCURONIUM BROMIDE 10 MG/ML (PF) SYRINGE
PREFILLED_SYRINGE | INTRAVENOUS | Status: AC
  Filled 2023-03-05: qty 10

## 2023-03-05 MED ORDER — ACETAMINOPHEN 500 MG PO TABS
1000.0000 mg | ORAL_TABLET | Freq: Four times a day (QID) | ORAL | Status: DC
  Administered 2023-03-05 – 2023-03-06 (×2): 1000 mg via ORAL
  Filled 2023-03-05 (×2): qty 2

## 2023-03-05 MED ORDER — GABAPENTIN 300 MG PO CAPS
600.0000 mg | ORAL_CAPSULE | Freq: Three times a day (TID) | ORAL | Status: DC
  Administered 2023-03-05 – 2023-03-06 (×2): 600 mg via ORAL
  Filled 2023-03-05: qty 6
  Filled 2023-03-05: qty 2

## 2023-03-05 MED ORDER — PROPOFOL 10 MG/ML IV BOLUS
INTRAVENOUS | Status: AC
  Filled 2023-03-05: qty 20

## 2023-03-05 MED ORDER — LIDOCAINE HCL (CARDIAC) PF 100 MG/5ML IV SOSY
PREFILLED_SYRINGE | INTRAVENOUS | Status: DC | PRN
  Administered 2023-03-05: 50 mg via INTRAVENOUS

## 2023-03-05 MED ORDER — LACTATED RINGERS IV SOLN: INTRAVENOUS | Status: DC

## 2023-03-05 MED ORDER — DEXAMETHASONE SODIUM PHOSPHATE 10 MG/ML IJ SOLN
INTRAMUSCULAR | Status: DC | PRN
  Administered 2023-03-05: 8 mg via INTRAVENOUS

## 2023-03-05 MED ORDER — CHLORHEXIDINE GLUCONATE 0.12 % MT SOLN
15.0000 mL | Freq: Once | OROMUCOSAL | Status: AC
  Administered 2023-03-05: 15 mL via OROMUCOSAL

## 2023-03-05 MED ORDER — BUPIVACAINE HCL 0.25 % IJ SOLN
INTRAMUSCULAR | Status: DC | PRN
  Administered 2023-03-05: 30 mL

## 2023-03-05 MED ORDER — LISINOPRIL 20 MG PO TABS
20.0000 mg | ORAL_TABLET | Freq: Once | ORAL | Status: AC
  Administered 2023-03-05: 20 mg via ORAL
  Filled 2023-03-05: qty 1

## 2023-03-05 MED ORDER — EPHEDRINE SULFATE (PRESSORS) 50 MG/ML IJ SOLN
INTRAMUSCULAR | Status: DC | PRN
  Administered 2023-03-05: 5 mg via INTRAVENOUS

## 2023-03-05 MED ORDER — SODIUM CHLORIDE 0.9 % IV SOLN
2.0000 g | INTRAVENOUS | Status: AC
  Administered 2023-03-05: 2 g via INTRAVENOUS
  Filled 2023-03-05: qty 2

## 2023-03-05 MED ORDER — INDOCYANINE GREEN 25 MG IV SOLR
1.2500 mg | Freq: Once | INTRAVENOUS | Status: AC
  Administered 2023-03-05: 1.25 mg via INTRAVENOUS

## 2023-03-05 MED ORDER — LABETALOL HCL 5 MG/ML IV SOLN
INTRAVENOUS | Status: AC
  Filled 2023-03-05: qty 4

## 2023-03-05 MED ORDER — HYDROMORPHONE HCL 1 MG/ML IJ SOLN
0.2500 mg | INTRAMUSCULAR | Status: DC | PRN
  Administered 2023-03-05 (×2): 0.5 mg via INTRAVENOUS

## 2023-03-05 MED ORDER — HYDROMORPHONE HCL 1 MG/ML IJ SOLN
INTRAMUSCULAR | Status: AC
  Administered 2023-03-05: 0.5 mg via INTRAVENOUS
  Filled 2023-03-05: qty 1

## 2023-03-05 MED ORDER — POTASSIUM CHLORIDE IN NACL 20-0.45 MEQ/L-% IV SOLN
INTRAVENOUS | Status: DC
  Filled 2023-03-05: qty 1000

## 2023-03-05 MED ORDER — TIZANIDINE HCL 4 MG PO TABS
6.0000 mg | ORAL_TABLET | Freq: Four times a day (QID) | ORAL | Status: DC
  Administered 2023-03-05 – 2023-03-06 (×2): 6 mg via ORAL
  Filled 2023-03-05 (×2): qty 2

## 2023-03-05 MED ORDER — MIDAZOLAM HCL 5 MG/5ML IJ SOLN
INTRAMUSCULAR | Status: DC | PRN
  Administered 2023-03-05 (×2): 1 mg via INTRAVENOUS

## 2023-03-05 MED ORDER — MORPHINE SULFATE (PF) 2 MG/ML IV SOLN: 1.0000 mg | INTRAVENOUS | Status: DC | PRN

## 2023-03-05 MED ORDER — LABETALOL HCL 5 MG/ML IV SOLN
INTRAVENOUS | Status: AC
  Administered 2023-03-05: 5 mg via INTRAVENOUS
  Filled 2023-03-05: qty 4

## 2023-03-05 MED ORDER — FENTANYL CITRATE (PF) 250 MCG/5ML IJ SOLN
INTRAMUSCULAR | Status: AC
  Filled 2023-03-05: qty 5

## 2023-03-05 MED ORDER — INSULIN ASPART 100 UNIT/ML IJ SOLN
0.0000 [IU] | Freq: Three times a day (TID) | INTRAMUSCULAR | Status: DC
  Administered 2023-03-06: 2 [IU] via SUBCUTANEOUS

## 2023-03-05 MED ORDER — DIPHENHYDRAMINE HCL 50 MG/ML IJ SOLN: 12.5000 mg | Freq: Four times a day (QID) | INTRAMUSCULAR | Status: DC | PRN

## 2023-03-05 MED ORDER — AMISULPRIDE (ANTIEMETIC) 5 MG/2ML IV SOLN
INTRAVENOUS | Status: AC
  Administered 2023-03-05: 10 mg via INTRAVENOUS
  Filled 2023-03-05: qty 4

## 2023-03-05 MED ORDER — SUGAMMADEX SODIUM 200 MG/2ML IV SOLN
INTRAVENOUS | Status: DC | PRN
  Administered 2023-03-05: 400 mg via INTRAVENOUS

## 2023-03-05 MED ORDER — DEXAMETHASONE SODIUM PHOSPHATE 10 MG/ML IJ SOLN
INTRAMUSCULAR | Status: AC
  Filled 2023-03-05: qty 1

## 2023-03-05 MED ORDER — LABETALOL HCL 5 MG/ML IV SOLN: 5.0000 mg | INTRAVENOUS | Status: DC | PRN

## 2023-03-05 MED ORDER — HYDRALAZINE HCL 20 MG/ML IJ SOLN
INTRAMUSCULAR | Status: AC
  Administered 2023-03-05: 5 mg via INTRAVENOUS
  Filled 2023-03-05: qty 1

## 2023-03-05 MED ORDER — OXYCODONE HCL 5 MG PO TABS: 5.0000 mg | ORAL_TABLET | Freq: Once | ORAL | Status: DC | PRN

## 2023-03-05 MED ORDER — METFORMIN HCL 500 MG PO TABS
1000.0000 mg | ORAL_TABLET | Freq: Two times a day (BID) | ORAL | Status: DC
  Administered 2023-03-06: 1000 mg via ORAL
  Filled 2023-03-05: qty 2

## 2023-03-05 MED ORDER — LIDOCAINE HCL (PF) 2 % IJ SOLN
INTRAMUSCULAR | Status: AC
  Filled 2023-03-05: qty 5

## 2023-03-05 MED ORDER — ONDANSETRON HCL 4 MG/2ML IJ SOLN
4.0000 mg | Freq: Once | INTRAMUSCULAR | Status: AC | PRN
  Administered 2023-03-05: 4 mg via INTRAVENOUS

## 2023-03-05 MED ORDER — ROCURONIUM BROMIDE 100 MG/10ML IV SOLN
INTRAVENOUS | Status: DC | PRN
  Administered 2023-03-05: 70 mg via INTRAVENOUS

## 2023-03-05 MED ORDER — PROPOFOL 10 MG/ML IV BOLUS
INTRAVENOUS | Status: DC | PRN
  Administered 2023-03-05: 20 mg via INTRAVENOUS
  Administered 2023-03-05: 180 mg via INTRAVENOUS

## 2023-03-05 MED ORDER — AMISULPRIDE (ANTIEMETIC) 5 MG/2ML IV SOLN: 10.0000 mg | Freq: Once | INTRAVENOUS | Status: AC | PRN

## 2023-03-05 MED ORDER — MIDAZOLAM HCL 2 MG/2ML IJ SOLN
INTRAMUSCULAR | Status: AC
  Filled 2023-03-05: qty 2

## 2023-03-05 MED ORDER — LABETALOL HCL 5 MG/ML IV SOLN
5.0000 mg | INTRAVENOUS | Status: AC | PRN
  Administered 2023-03-05: 5 mg via INTRAVENOUS

## 2023-03-05 MED ORDER — FENTANYL CITRATE (PF) 100 MCG/2ML IJ SOLN
INTRAMUSCULAR | Status: DC | PRN
  Administered 2023-03-05: 100 ug via INTRAVENOUS
  Administered 2023-03-05: 25 ug via INTRAVENOUS
  Administered 2023-03-05: 100 ug via INTRAVENOUS
  Administered 2023-03-05: 25 ug via INTRAVENOUS

## 2023-03-05 MED ORDER — LISINOPRIL 20 MG PO TABS
20.0000 mg | ORAL_TABLET | Freq: Every day | ORAL | Status: DC
  Administered 2023-03-06: 20 mg via ORAL
  Filled 2023-03-05: qty 1

## 2023-03-05 MED ORDER — SIMETHICONE 80 MG PO CHEW: 40.0000 mg | CHEWABLE_TABLET | Freq: Four times a day (QID) | ORAL | Status: DC | PRN

## 2023-03-05 MED ORDER — ZOLPIDEM TARTRATE 5 MG PO TABS: 5.0000 mg | ORAL_TABLET | Freq: Every evening | ORAL | Status: DC | PRN

## 2023-03-05 MED ORDER — OXYCODONE HCL 5 MG PO TABS
5.0000 mg | ORAL_TABLET | ORAL | Status: DC | PRN
  Administered 2023-03-05 – 2023-03-06 (×2): 10 mg via ORAL
  Filled 2023-03-05 (×2): qty 2

## 2023-03-05 MED ORDER — HYDRALAZINE HCL 20 MG/ML IJ SOLN: 5.0000 mg | Freq: Once | INTRAMUSCULAR | Status: AC

## 2023-03-05 MED ORDER — DIPHENHYDRAMINE HCL 12.5 MG/5ML PO ELIX: 12.5000 mg | ORAL_SOLUTION | Freq: Four times a day (QID) | ORAL | Status: DC | PRN

## 2023-03-05 MED ORDER — OXYCODONE HCL 5 MG/5ML PO SOLN: 5.0000 mg | Freq: Once | ORAL | Status: DC | PRN

## 2023-03-05 MED ORDER — BUPIVACAINE HCL (PF) 0.25 % IJ SOLN
INTRAMUSCULAR | Status: AC
  Filled 2023-03-05: qty 30

## 2023-03-05 MED ORDER — SUCCINYLCHOLINE CHLORIDE 200 MG/10ML IV SOSY
PREFILLED_SYRINGE | INTRAVENOUS | Status: AC
  Filled 2023-03-05: qty 10

## 2023-03-05 SURGICAL SUPPLY — 62 items
ADH SKN CLS APL DERMABOND .7 (GAUZE/BANDAGES/DRESSINGS)
APL PRP STRL LF DISP 70% ISPRP (MISCELLANEOUS) ×1
APL SRG 38 LTWT LNG FL B (MISCELLANEOUS)
APPLICATOR ARISTA FLEXITIP XL (MISCELLANEOUS) IMPLANT
APPLIER CLIP 5 13 M/L LIGAMAX5 (MISCELLANEOUS)
APPLIER CLIP ROT 10 11.4 M/L (STAPLE) ×1
APR CLP MED LRG 11.4X10 (STAPLE) ×1
APR CLP MED LRG 5 ANG JAW (MISCELLANEOUS)
BAG COUNTER SPONGE SURGICOUNT (BAG) IMPLANT
BAG SPEC RTRVL 10 TROC 200 (ENDOMECHANICALS) ×1
BAG SPNG CNTER NS LX DISP (BAG)
CABLE HIGH FREQUENCY MONO STRZ (ELECTRODE) ×1 IMPLANT
CHLORAPREP W/TINT 26 (MISCELLANEOUS) ×1 IMPLANT
CLIP APPLIE 5 13 M/L LIGAMAX5 (MISCELLANEOUS) IMPLANT
CLIP APPLIE ROT 10 11.4 M/L (STAPLE) IMPLANT
CLIP LIGATING HEMO O LOK GREEN (MISCELLANEOUS) IMPLANT
COVER MAYO STAND XLG (MISCELLANEOUS) IMPLANT
COVER SURGICAL LIGHT HANDLE (MISCELLANEOUS) ×1 IMPLANT
DERMABOND ADVANCED .7 DNX12 (GAUZE/BANDAGES/DRESSINGS) IMPLANT
DRAIN CHANNEL 19F RND (DRAIN) IMPLANT
DRAPE C-ARM 42X120 X-RAY (DRAPES) IMPLANT
DRSG TEGADERM 2-3/8X2-3/4 SM (GAUZE/BANDAGES/DRESSINGS) ×3 IMPLANT
DRSG TEGADERM 4X4.75 (GAUZE/BANDAGES/DRESSINGS) ×1 IMPLANT
ELECT REM PT RETURN 15FT ADLT (MISCELLANEOUS) ×1 IMPLANT
EVACUATOR SILICONE 100CC (DRAIN) IMPLANT
GAUZE SPONGE 2X2 8PLY STRL LF (GAUZE/BANDAGES/DRESSINGS) ×1 IMPLANT
GLOVE BIO SURGEON STRL SZ7.5 (GLOVE) ×1 IMPLANT
GLOVE INDICATOR 8.0 STRL GRN (GLOVE) ×1 IMPLANT
GOWN STRL REUS W/ TWL XL LVL3 (GOWN DISPOSABLE) ×1 IMPLANT
GOWN STRL REUS W/TWL XL LVL3 (GOWN DISPOSABLE) ×1
GRASPER SUT TROCAR 14GX15 (MISCELLANEOUS) IMPLANT
HEMOSTAT ARISTA ABSORB 3G PWDR (HEMOSTASIS) IMPLANT
HEMOSTAT SNOW SURGICEL 2X4 (HEMOSTASIS) IMPLANT
IRRIG SUCT STRYKERFLOW 2 WTIP (MISCELLANEOUS) ×1
IRRIGATION SUCT STRKRFLW 2 WTP (MISCELLANEOUS) ×1 IMPLANT
KIT BASIN OR (CUSTOM PROCEDURE TRAY) ×1 IMPLANT
KIT TURNOVER KIT A (KITS) IMPLANT
L-HOOK LAP DISP 36CM (ELECTROSURGICAL)
LHOOK LAP DISP 36CM (ELECTROSURGICAL) IMPLANT
POUCH RETRIEVAL ECOSAC 10 (ENDOMECHANICALS) ×1 IMPLANT
POUCH RETRIEVAL ECOSAC 10MM (ENDOMECHANICALS) ×1
RELOAD STAPLE 60 2.6 WHT THN (STAPLE) IMPLANT
RELOAD STAPLER WHITE 60MM (STAPLE) ×1 IMPLANT
SCISSORS LAP 5X35 DISP (ENDOMECHANICALS) ×1 IMPLANT
SET CHOLANGIOGRAPH MIX (MISCELLANEOUS) IMPLANT
SET TUBE SMOKE EVAC HIGH FLOW (TUBING) ×1 IMPLANT
SLEEVE Z-THREAD 5X100MM (TROCAR) ×2 IMPLANT
SPIKE FLUID TRANSFER (MISCELLANEOUS) ×1 IMPLANT
STAPLE ECHEON FLEX 60 POW ENDO (STAPLE) IMPLANT
STAPLER RELOAD WHITE 60MM (STAPLE) ×1
STRIP CLOSURE SKIN 1/2X4 (GAUZE/BANDAGES/DRESSINGS) ×1 IMPLANT
SUT ETHILON 2 0 PS N (SUTURE) IMPLANT
SUT MNCRL AB 4-0 PS2 18 (SUTURE) ×1 IMPLANT
SUT VIC AB 0 UR5 27 (SUTURE) IMPLANT
SUT VICRYL 0 TIES 12 18 (SUTURE) IMPLANT
SUT VICRYL 0 UR6 27IN ABS (SUTURE) IMPLANT
TOWEL OR 17X26 10 PK STRL BLUE (TOWEL DISPOSABLE) ×1 IMPLANT
TOWEL OR NON WOVEN STRL DISP B (DISPOSABLE) ×1 IMPLANT
TRAY LAPAROSCOPIC (CUSTOM PROCEDURE TRAY) ×1 IMPLANT
TROCAR 11X100 Z THREAD (TROCAR) IMPLANT
TROCAR BALLN 12MMX100 BLUNT (TROCAR) ×1 IMPLANT
TROCAR Z-THREAD OPTICAL 5X100M (TROCAR) ×1 IMPLANT

## 2023-03-05 NOTE — Anesthesia Procedure Notes (Signed)
Procedure Name: Intubation Date/Time: 03/05/2023 1:46 PM  Performed by: Garrel Ridgel, CRNAPre-anesthesia Checklist: Patient identified, Emergency Drugs available, Suction available, Patient being monitored and Timeout performed Patient Re-evaluated:Patient Re-evaluated prior to induction Oxygen Delivery Method: Circle system utilized Preoxygenation: Pre-oxygenation with 100% oxygen Induction Type: IV induction Ventilation: Mask ventilation without difficulty Laryngoscope Size: Mac and 3 Grade View: Grade I Tube type: Oral Tube size: 7.0 mm Number of attempts: 1 Airway Equipment and Method: Stylet Placement Confirmation: ETT inserted through vocal cords under direct vision, positive ETCO2, CO2 detector and breath sounds checked- equal and bilateral Secured at: 21 cm Tube secured with: Tape Dental Injury: Teeth and Oropharynx as per pre-operative assessment

## 2023-03-05 NOTE — Interval H&P Note (Signed)
History and Physical Interval Note:  03/05/2023 12:56 PM  Debbie [name]  has presented today for surgery, with the diagnosis of CHRONIC CHOLECYSTITIS, HISTORY OF GALLBLADDER DRAIN.  The various methods of treatment have been discussed with the patient and family. After consideration of risks, benefits and other options for treatment, the patient has consented to  Procedure(s): LAPAROSCOPIC CHOLECYSTECTOMY WITH ICG (N/A) as a surgical intervention.  The patient's history has been reviewed, patient examined, no change in status, stable for surgery.  I have reviewed the patient's chart and labs.  Questions were answered to the patient's satisfaction.    Leighton Ruff. Redmond Pulling, MD, [place], Bariatric, & Minimally Invasive Surgery [facility],  [place]  Greer Pickerel

## 2023-03-05 NOTE — Anesthesia Preprocedure Evaluation (Addendum)
Anesthesia Evaluation  Patient identified by MRN, date of birth, ID band Patient awake    Reviewed: Allergy & Precautions, NPO status , Patient's Chart, lab work & pertinent test results, reviewed documented beta blocker date and time   Airway Mallampati: II       Dental no notable dental hx. (+) Teeth Intact   Pulmonary pneumonia, resolved, former smoker   Pulmonary exam normal breath sounds clear to auscultation       Cardiovascular hypertension, Pt. on medications Normal cardiovascular exam Rhythm:Regular Rate:Normal  EKG 02/26/23 NSR, non specific t wave abnormalities  Echo 12/25/22 1. Left ventricular ejection fraction, by estimation, is 55 to 60%. The  left ventricle has normal function. The left ventricle has no regional  wall motion abnormalities. There is moderate concentric left ventricular  hypertrophy. Left ventricular  diastolic parameters were normal.   2. Right ventricular systolic function is normal. The right ventricular  size is normal.   3. The mitral valve is normal in structure. No evidence of mitral valve  regurgitation. No evidence of mitral stenosis.   4. The aortic valve is tricuspid. Aortic valve regurgitation is not  visualized. No aortic stenosis is present.   5. Aortic dilatation noted. There is mild dilatation of the aortic root,  measuring 39 mm.   6. The inferior vena cava is dilated in size with >50% respiratory  variability, suggesting right atrial pressure of 8 mmHg.     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 S/P thoracic laminectomy  Neuromuscular disease  negative psych ROS   GI/Hepatic Neg liver ROS,GERD  Medicated,,Cholelithiasis with chronic cholecystitis Neurogenic bowel   Endo/Other  Obesity Hyperlipidemia  Renal/GU Renal disease   Neurogenic bladder    Musculoskeletal  (+) Arthritis ,  Chronic hip pain due to AVN   Abdominal  (+) + obese  Peds   Hematology negative hematology ROS (+)   Anesthesia Other Findings   Reproductive/Obstetrics                              Anesthesia Physical Anesthesia Plan  ASA: 3  Anesthesia Plan: General   Post-op Pain Management: Minimal or no pain anticipated and Precedex   Induction: Intravenous, Cricoid pressure planned and Rapid sequence  PONV Risk Score and Plan: 4 or greater and Treatment may vary due to age or medical condition, Ondansetron and Midazolam  Airway Management Planned: Oral ETT  Additional Equipment: None  Intra-op Plan:   Post-operative Plan: Extubation in OR  Informed Consent: I have reviewed the patients History and Physical, chart, labs and discussed the procedure including the risks, benefits and alternatives for the proposed anesthesia with the patient or authorized representative who has indicated his/her understanding and acceptance.     Dental advisory given  Plan Discussed with: CRNA and Anesthesiologist  Anesthesia Plan Comments:          Anesthesia Quick Evaluation

## 2023-03-05 NOTE — H&P (Signed)
REFERRING PHYSICIAN: [facility]  PROVIDER: Jullie Arps Leanne Chang, MD  MRN: [id number] DOB: [birth year] DATE OF ENCOUNTER: 02/15/2023  Subjective  Chief Complaint: New Consultation (GALLBLADDER )   History of Present Illness: Debbie [name] is a 47 y.o. female who is seen today as an office consultation at the request of Dr. Sandria Manly for evaluation of New Consultation (GALLBLADDER ) . Was hospitalized for acute cholecystitis and underwent cholecystostomy tube placement because of bacteremia, acute kidney injury.  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  She was transferred from [facility] to [place] on January 21 with Salmonella bacteremia and acute cholecystitis with worsening renal failure. At outside hospital urine cultures grew E. coli. Blood cultures returned positive for Salmonella. She underwent cholecystostomy tube on January 22. She also developed rectal bleeding with hemoglobin dropping to 6.8 from 9.2 requiring 1 unit of PRBC during her hospitalization. She has followed up with infectious disease a few times since her hospitalization and just finished her last oral antibiotic. She has had a follow-up outpatient cholangiogram through her cholecystostomy tube which revealed a patent cystic duct and common bile duct.  The patient states she is doing fine. No abdominal pain good appetite. No nausea or vomiting. Good appetite. No fever or chills. No diarrhea or constipation. She tends to lean a little bit toward loose stools anyway. No chest pain or chest pressure. She is wheelchair dependent given her paraplegia. She is able to void on her own. She is accompanied by family member.   Review of Systems: A complete review of systems was obtained from the patient. I have reviewed this information and discussed as appropriate with the patient. See HPI as well for other  ROS.  ROS  Medical History: Past Medical History: Diagnosis Date Diabetes mellitus without complication (CMS-HCC) Hyperlipidemia  Patient Active Problem List Diagnosis Paraplegia (CMS-HCC)  Past Surgical History: Procedure Laterality Date APPENDECTOMY CESAREAN DELIVERY FAILED VAGINAL DELIVERY W/PREVIOUS CESARIAN left ankle surgery   No Known Allergies  Current Outpatient Medications on File Prior to Visit Medication Sig Dispense Refill atorvastatin (LIPITOR) 20 MG tablet Take 20 mg by mouth once daily gabapentin (NEURONTIN) 100 MG capsule Take by mouth lisinopriL (ZESTRIL) 20 MG tablet Take 20 mg by mouth once daily metFORMIN (GLUCOPHAGE) 1000 MG tablet Take by mouth pantoprazole (PROTONIX) 40 MG DR tablet Take 40 mg by mouth once daily tiZANidine (ZANAFLEX) 4 MG tablet TAKE 1 AND 1/2 TABLETS BY MOUTH FOUR TIMES DAILY  No current facility-administered medications on file prior to visit.  Family History Problem Relation Age of Onset Diabetes Mother   Social History  Tobacco Use Smoking Status Former Types: Cigarettes Smokeless Tobacco Never   Social History  Socioeconomic History Marital status: Single Tobacco Use Smoking status: Former Types: Cigarettes Smokeless tobacco: Never Vaping Use Vaping Use: Never used Substance and Sexual Activity Alcohol use: Not Currently Drug use: Never  Objective:  Vitals: 02/15/23 1354 BP: (!) 149/96 Pulse: 66 Temp: 36.6 C (97.9 F) SpO2: 96% Weight: (!) 106.6 kg (235 lb) Height: 174 cm (5' 8.5") PainSc: 0-No pain  Body mass index is 35.21 kg/m.  Constitutional: NAD; conversant; no deformities; sitting in motorized wheelchair Eyes: Moist conjunctiva; no lid lag; anicteric; PERRL Neck: Trachea midline; no thyromegaly Lungs: Normal respiratory effort; no tactile fremitus CV: RRR; no palpable thrills; no pitting edema GI: Abd, nontender, nondistended, obese, cholecystostomy tube which has been capped; no  palpable hepatosplenomegaly MSK: no  clubbing/cyanosis Psychiatric: Appropriate affect; alert and oriented x3 Lymphatic: No palpable cervical or axillary lymphadenopathy Skin: No rash, lesions or jaundice  Labs, Imaging and Diagnostic Testing: Infectious disease note January 08, 2023 Dr. Candiss Norse Factious disease note January 17, 2023 Hospital history and physical December 24, 2022 Hospital discharge summary December 31, 2022  IR cholangiogram February 12, 2023  CT abdomen pelvis December 27, 2022  Ultrasound abdomen right upper quadrant December 24, 2022  Echocardiogram December 25, 2022  CBC January 08, 2023 showed a white count 11.2 down from 18.4; hemoglobin 9.9, hematocrit 32, platelets 487 Assessment and Plan:   Diagnoses and all orders for this visit:  Chronic cholecystitis with calculus  Paraplegia (CMS-HCC)  History of bacteremia  Diabetes mellitus type 2 in obese  Cholecystostomy care (CMS-HCC)    I believe the patient's symptoms are consistent with gallbladder disease.  We discussed gallbladder disease. The patient was given Neurosurgeon.  We discussed removal of the cholecystostomy tube but we discussed with that there would be as high as a 50% chance of recurrent cholecystitis so therefore I recommended proceeding with laparoscopic interval cholecystectomy with ICG dye.  I discussed laparoscopic cholecystectomy with possible IOC in detail. The patient was given educational material as well as diagrams detailing the procedure. We discussed the risks and benefits of a laparoscopic cholecystectomy including, but not limited to bleeding, infection, injury to surrounding structures such as the intestine or liver, bile leak, retained gallstones, need to convert to an open procedure, prolonged diarrhea, blood clots such as DVT, common bile duct injury, anesthesia risks, and possible need for additional procedures. We discussed the typical post-operative recovery course.  I explained that the likelihood of improvement of their symptoms is good.  Because this will be an interval cholecystectomy we did discuss that she is at increased risk for conversion to open, fenestrated cholecystectomy, bile leak, injury to surrounding structures. I still think it is low but definitely higher than mild symptomatic cholelithiasis.  SHe would be interested in going home same day if surgery is straightforward and were able to remove the entire gallbladder; however we could keep her overnight if needed  High level of medical decision making requiring extensive amount of medical data review as well as decision regarding elective major surgery with identified risk factors  No follow-ups on file.  Shayn Madole Leanne Chang, MD General, Minimally Invasive, & Bariatric Surgery     Electronically signed by Rudean Curt, MD at 02/15/2023 2:48 PM EDT

## 2023-03-05 NOTE — Op Note (Signed)
03/05/2023  3:42 PM  PATIENT:  Debbie [name]  47 y.o. female  PRE-OPERATIVE DIAGNOSIS:  CHRONIC CALCULUS CHOLECYSTITIS, HISTORY OF cholecystostomy tube  POST-OPERATIVE DIAGNOSIS:  same  PROCEDURE:  Procedure(s): LAPAROSCOPIC CHOLECYSTECTOMY WITH indocyanine green  SURGEON:  Surgeon(s): Greer Pickerel, MD   ASSISTANTS: Johnathan Hausen, MD (an assistant was requested and needed due to the extreme chronic inflammation of the anatomy and to help identify anatomy & for tissue retraction)  ANESTHESIA:   general  DRAINS: (15fr) Jackson-Pratt drain(s) with closed bulb suction in the right upper quadrant    LOCAL MEDICATIONS USED:  MARCAINE     SPECIMEN:  Source of Specimen:  Gallbladder  DISPOSITION OF SPECIMEN:  PATHOLOGY  COUNTS:  YES  INDICATION FOR PROCEDURE: 47 year old female with past medical history of diabetes mellitus type 2, MS, hypertension, paraplegia who had been admitted to the hospital in late January with sepsis, acute calculus cholecystitis and AKI.  She underwent percutaneous cholecystostomy tube placement because she was felt not to be a safe candidate for surgery during that admission.  She has subsequently recovered.  She had an outpatient study through her cholecystostomy tube which revealed a patent cystic duct and common bile duct.  We discussed potential risk for recurrent cholecystitis with simple drain removal since she has underlying cholelithiasis.  She elected to proceed with interval cholecystectomy.  We discussed the risk and benefits extensively which are separately documented.  PROCEDURE: The patient received ICG dye preoperatively.  After obtaining informed consent the patient was taken the OR 5 at [facility] placed upon on the operating room table.  General endotracheal anesthesia was established.  Sequential compression devices were placed.  Her abdomen was prepped and draped in the usual standard surgical fashion.  She received IV antibiotic  prior to skin incision.  The cholecystostomy tube was draped outside of the field.  Because of her obesity I elected to gain access to the abdomen using the Optiview technique.  A small incision was made just to the left of the midline and the upper abdomen just below the left subcostal margin.  Using a 5 mm 0 degree laparoscope through a 5 mm trocar I carefully advanced it through all layers of the abdominal wall and entered the abdominal cavity.  Pneumoperitoneum was smoothly established to a patient pressure of 15 mmHg.  No change in patient vital signs.  There is no evidence of injury to surrounding viscera.  Patient was placed in reverse Trendelenburg and rotated to the left.  A 5 mm trocar was placed in the supraumbilical position.  I visualized the right upper quadrant.  The patient had numerous adhesions between the right hepatic lobe and the abdominal wall underneath the rib cage consistent with Rochele Raring type adhesions.  They were quite numerous and quite dense.  The patient also had some omentum tethered to the anterior abdominal wall in front of the gallbladder.  I did take down some of the omentum in the right upper quadrant in order to place my 2 right-sided 5 mm trocars.  I had upsized the optical entry trocar to an 11 mm trocar.  I visualized the cholecystostomy tube coming through the abdominal wall into the gallbladder.  The gallbladder was grasped.  It was contracted.  My assistant grasped and retracted it slightly upward toward the right shoulder.  There was dense woody inflammation around the body of the gallbladder.  It was almost as if she had some of her duodenum tethered up to her gallbladder  as a Verne Grain sees type picture.  There appeared to be a almost necrotic lymph node.  I was able to carefully tease the duodenum away.  There was no evidence of serosal tear to the duodenum.  Again the gallbladder was severely contracted and had dense woody inflammation.  Infundibulum was grasped  and retracted laterally.  Again there was dense woody inflammation around the infundibulum and cystic duct junction would typically be.  Near infrared fluorescent cholangiography was performed.  The liver, common hepatic duct and common bile duct fluoresced and was visualized.  I really could not see the cystic duct on near infrared fluorescent cholangiography.  Given how dense and woody the inflammation was I would really not able to tease out any structures at this point so therefore we decided to do a dome down approach.  Using hook electrocautery I took down the gallbladder starting at the the dome and going down.  I was able to get somewhat of a plane between the posterior wall of the gallbladder and the liver.  The gallbladder was not entered.  I had previously cut the cholecystostomy tube at it went into the gallbladder before starting this dissection.  As we got further down the body of the gallbladder posteriorly identified a small tubular structure which appeared to be going into the gallbladder.  I was able to circumferentially dissected out and around.  I placed 2 clips on it proximally and transected it just as it entered the gallbladder.  We could see an arteriole which led me to believe this was the cystic artery.  At this point we did not feel comfortable doing a more proximal dissection.  Near infrared fluorescent cholangiography was performed again.  The common bile duct and common hepatic duct were again visualized with near infrared fluorescent cholangiography.  Again cystic duct was not visualized on cholangiography.  The common bile duct and common hepatic duct were probably about 2 cm away.  I did not feel that it Endoloop would cinched down and provide adequate occlusion.  So I obtained an Ethicon Echelon 60 stapler with a white load and stapled across what appeared to be the junction of the cystic duct and infundibulum.  Before transecting it with the stapler I performed near infrared  cholangiography again.  Stapler was well away from the common hepatic duct and common bile duct.  It was probably about 2 cm away.  The stapler was fired.  There were 3 rows of staples that were intact.  The gallbladder fossa was inspected.  There is no evidence of ongoing bleeding.  There is no evidence of bile leak.  I reinspected the staple line which appeared intact and hemostatic.  I reinspected the omentum and duodenum which had been peeled away which also appeared normal.  I did elect to leave a 19 French drain in the right upper quadrant.  It was secured to the skin with a nylon.  It was placed in the gallbladder fossa and around the staple line.  The specimen was removed from the abdomen in the left upper quadrant trocar site after the fascial defect had been enlarged.  The specimen had been placed in an Ecco sac.  The fascial defect was then closed with 3 interrupted 0 Vicryl's using PMI suture passer with laparoscopic guidance.  Pneumoperitoneum was released.  Skin incisions were closed with a 4 Monocryl in a subcuticular fashion followed by application of Steri-Strips 2 x 2's and sterile bandages.  All needle, instrument sponge counts were  correct x 2.  Patient was extubated taken recovery room in stable condition  PLAN OF CARE: Admit for overnight observation  PATIENT DISPOSITION:  PACU - hemodynamically stable.   Delay start of Pharmacological VTE agent (>24hrs) due to surgical blood loss or risk of bleeding:  no  Leighton Ruff. Redmond Pulling, MD, FACS General, Bariatric, & Minimally Invasive Surgery [facility], [place]

## 2023-03-05 NOTE — Anesthesia Postprocedure Evaluation (Signed)
Anesthesia Post Note  Patient: Debbie [name]  Procedure(s) Performed: LAPAROSCOPIC CHOLECYSTECTOMY WITH ICG     Patient location during evaluation: PACU Anesthesia Type: General Level of consciousness: awake and alert and oriented Pain management: pain level controlled Vital Signs Assessment: post-procedure vital signs reviewed and stable Respiratory status: spontaneous breathing, nonlabored ventilation, respiratory function stable and patient connected to nasal cannula oxygen Cardiovascular status: blood pressure returned to baseline and stable Postop Assessment: no apparent nausea or vomiting Anesthetic complications: no   No notable events documented.  Last Vitals:  Vitals:   03/05/23 1530 03/05/23 1545  BP: (!) 200/103 (!) 207/106  Pulse: 85 78  Resp: 14 13  Temp:  36.9 C  SpO2: 100% 100%    Last Pain:  Vitals:   03/05/23 1545  TempSrc:   PainSc: 5                  Marcellus Pulliam A.

## 2023-03-05 NOTE — Plan of Care (Signed)
  Problem: Nutrition: Goal: Adequate nutrition will be maintained Outcome: Progressing   Problem: Activity: Goal: Risk for activity intolerance will decrease Outcome: Progressing   Problem: Pain Managment: Goal: General experience of comfort will improve Outcome: Progressing   

## 2023-03-05 NOTE — Transfer of Care (Signed)
Immediate Anesthesia Transfer of Care Note  Patient: Debbie [name]  Procedure(s) Performed: LAPAROSCOPIC CHOLECYSTECTOMY WITH ICG  Patient Location: PACU  Anesthesia Type:General  Level of Consciousness: awake, alert , oriented, and patient cooperative  Airway & Oxygen Therapy: Patient Spontanous Breathing and Patient connected to face mask oxygen  Post-op Assessment: Report given to RN and Post -op Vital signs reviewed and stable  Post vital signs: Reviewed and stable  Last Vitals:  Vitals Value Taken Time  BP 200/103 03/05/23 1531  Temp    Pulse 85 03/05/23 1535  Resp 17 03/05/23 1535  SpO2 100 % 03/05/23 1535  Vitals shown include unvalidated device data.  Last Pain:  Vitals:   03/05/23 1132  TempSrc:   PainSc: 4       Patients Stated Pain Goal: 4 ([id number])  Complications: No notable events documented.

## 2023-03-05 NOTE — Progress Notes (Signed)
Per Dr Royce Macadamia with anesthesia no urine HCG needed for surgery today.  Jasmine Pang BSN, Academic librarian - Perioperative Services [place] [phone]

## 2023-03-06 ENCOUNTER — Ambulatory Visit: Payer: Self-pay | Admitting: General Surgery

## 2023-03-06 ENCOUNTER — Encounter (HOSPITAL_COMMUNITY): Payer: Self-pay | Admitting: General Surgery

## 2023-03-06 DIAGNOSIS — Z7984 Long term (current) use of oral hypoglycemic drugs: Secondary | ICD-10-CM | POA: Diagnosis not present

## 2023-03-06 DIAGNOSIS — G8220 Paraplegia, unspecified: Secondary | ICD-10-CM | POA: Diagnosis not present

## 2023-03-06 DIAGNOSIS — E119 Type 2 diabetes mellitus without complications: Secondary | ICD-10-CM | POA: Diagnosis not present

## 2023-03-06 DIAGNOSIS — K8010 Calculus of gallbladder with chronic cholecystitis without obstruction: Secondary | ICD-10-CM | POA: Diagnosis not present

## 2023-03-06 DIAGNOSIS — Z79899 Other long term (current) drug therapy: Secondary | ICD-10-CM | POA: Diagnosis not present

## 2023-03-06 DIAGNOSIS — Z87891 Personal history of nicotine dependence: Secondary | ICD-10-CM | POA: Diagnosis not present

## 2023-03-06 LAB — CBC
HCT: 35.8 % — ABNORMAL LOW (ref 36.0–46.0)
Hemoglobin: 10.8 g/dL — ABNORMAL LOW (ref 12.0–15.0)
MCH: 24.4 pg — ABNORMAL LOW (ref 26.0–34.0)
MCHC: 30.2 g/dL (ref 30.0–36.0)
MCV: 80.8 fL (ref 80.0–100.0)
Platelets: 380 10*3/uL (ref 150–400)
RBC: 4.43 MIL/uL (ref 3.87–5.11)
RDW: 22.5 % — ABNORMAL HIGH (ref 11.5–15.5)
WBC: 17.5 10*3/uL — ABNORMAL HIGH (ref 4.0–10.5)
nRBC: 0 % (ref 0.0–0.2)

## 2023-03-06 LAB — COMPREHENSIVE METABOLIC PANEL
ALT: 29 U/L (ref 0–44)
AST: 21 U/L (ref 15–41)
Albumin: 4.3 g/dL (ref 3.5–5.0)
Alkaline Phosphatase: 86 U/L (ref 38–126)
Anion gap: 10 (ref 5–15)
BUN: 9 mg/dL (ref 6–20)
CO2: 23 mmol/L (ref 22–32)
Calcium: 9.3 mg/dL (ref 8.9–10.3)
Chloride: 106 mmol/L (ref 98–111)
Creatinine, Ser: 0.57 mg/dL (ref 0.44–1.00)
GFR, Estimated: >60 mL/min (ref >60)
Glucose, Bld: 148 mg/dL — ABNORMAL HIGH (ref 70–99)
Potassium: 3.7 mmol/L (ref 3.5–5.1)
Sodium: 139 mmol/L (ref 135–145)
Total Bilirubin: 0.5 mg/dL (ref 0.3–1.2)
Total Protein: 8 g/dL (ref 6.5–8.1)

## 2023-03-06 LAB — GLUCOSE, CAPILLARY: Glucose-Capillary: 145 mg/dL — ABNORMAL HIGH (ref 70–99)

## 2023-03-06 MED ORDER — OXYCODONE HCL 5 MG PO TABS
5.0000 mg | ORAL_TABLET | Freq: Four times a day (QID) | ORAL | 0 refills | Status: AC | PRN
Start: 2023-03-06 — End: ?

## 2023-03-06 MED ORDER — HYDRALAZINE HCL 20 MG/ML IJ SOLN: 10.0000 mg | INTRAMUSCULAR | Status: DC | PRN

## 2023-03-06 MED ORDER — ACETAMINOPHEN 500 MG PO TABS: 1000.0000 mg | ORAL_TABLET | Freq: Three times a day (TID) | ORAL | 0 refills | Status: AC

## 2023-03-06 NOTE — Progress Notes (Signed)
Discharge package printed and instructions given to pt. Verbalizes understanding. 

## 2023-03-06 NOTE — Plan of Care (Signed)
  Problem: Pain Managment: Goal: General experience of comfort will improve 03/06/2023 0723 by Mayme Genta, RN Outcome: Progressing 03/05/2023 1824 by Mayme Genta, RN Outcome: Progressing   Problem: Activity: Goal: Risk for activity intolerance will decrease 03/06/2023 0723 by Mayme Genta, RN Outcome: Progressing 03/05/2023 1824 by Mayme Genta, RN Outcome: Progressing

## 2023-03-06 NOTE — Discharge Summary (Signed)
Physician Discharge Summary  Debbie [name] [id number] DOB: 1976/06/29 DOA: 03/05/2023  PCP: Neale Burly, MD  Admit date: 03/05/2023 Discharge date: 03/06/2023  Recommendations for Outpatient Follow-up:     Follow-up Information     Greer Pickerel, MD. Schedule an appointment as soon as possible for a visit in 3 week(s).   Specialty: General Surgery Why: For wound re-check Contact information: [address] [place] [address] [phone]         Surgery, [place]. Schedule an appointment as soon as possible for a visit in 1 week(s).   Specialty: General Surgery Why: for nurse appt for drain removal Contact information: [place] [address] [phone]                Discharge Diagnoses:  Chronic cholecystitis with calculus  Paraplegia (CMS-HCC)  History of bacteremia  Diabetes mellitus type 2 in obese  S/p Cholecystostomy tube placement jan 2024   Surgical Procedure: Interval laparoscopic cholecystectomy with ICG dye  Discharge Condition: Good Disposition: Home  Diet recommendation: carb modified  Filed Weights   03/05/23 1132  Weight: 104.3 kg    History of present illness:  Patient had presented to outside hospital in January with sepsis, acute kidney injury and acute calculus cholecystitis.  She underwent cholecystostomy tube placement.  She had recovered from her hospitalization.  She is referred for definitive management of her cholelithiasis and history of cholecystitis.  We discussed tube removal versus cholecystectomy.  We discussed the pros and cons of each approach.  The patient had an outpatient cholangiogram through her cholecystostomy tube which showed a patent cystic duct and common bile duct.  The patient elected to proceed with interval cholecystectomy  Hospital Course:  Patient was brought in for interval laparoscopic cholecystectomy.  The patient had a very contracted gallbladder.   She had dense woody fibrotic inflammation around her infundibulum.  ICG dye was used to help confirm location of biliary structures.  I stapled across the infundibulum.  A surgical drain was left in the patient was kept overnight for observation.  On postoperative day 1 she was doing well.  Her drain was serosanguineous.  She received drain instructions.  She was tolerating a diet.  Her vital signs were stable.  She had some hyypertension perioperatively and she was placed back on her home medication.  Discussed discharge instructions with her.  She was deemed stable for discharge  BP (!) 100/55 (BP Location: Left Arm)   Pulse 78   Temp 98.2 F (36.8 C) (Oral)   Resp 17   Ht 5\' 8"  (1.727 m)   Wt 104.3 kg   LMP 02/02/2023 Comment: [place] hCG not needed per Anesthesia  SpO2 97%   BMI 34.97 kg/m   Gen: alert, NAD, non-toxic appearing Pupils: equal, no scleral icterus Pulm: Lungs clear to auscultation, symmetric chest rise CV: regular rate and rhythm Abd: soft, mild approp tender, nondistended. No cellulitis. No incisional hernia Drain - serosang Ext: no edema, no calf tenderness Skin: no rash, no jaundice    Discharge Instructions  Discharge Instructions     Call MD for:   Complete by: As directed    Temperature >101   Call MD for:  hives   Complete by: As directed    Call MD for:  persistant dizziness or light-headedness   Complete by: As directed    Call MD for:  persistant nausea and vomiting   Complete by: As directed  Call MD for:  redness, tenderness, or signs of infection (pain, swelling, redness, odor or green/yellow discharge around incision site)   Complete by: As directed    Call MD for:  severe uncontrolled pain   Complete by: As directed    Diet Carb Modified   Complete by: As directed    Discharge instructions   Complete by: As directed    See [facility] discharge instructions   Increase activity slowly   Complete by: As directed       Allergies as of  03/06/2023       Reactions   Honey Bee Treatment [bee Venom] Anaphylaxis, Itching   Baclofen    Headaches         Medication List     STOP taking these medications    amoxicillin-clavulanate 875-125 MG tablet Commonly known as: AUGMENTIN   norethindrone 0.35 MG tablet Commonly known as: MICRONOR   ondansetron 4 MG disintegrating tablet Commonly known as: ZOFRAN-ODT   pantoprazole 40 MG tablet Commonly known as: PROTONIX       TAKE these medications    acetaminophen 500 MG tablet Commonly known as: TYLENOL Take 2 tablets (1,000 mg total) by mouth every 8 (eight) hours for 5 days. What changed:  how much to take when to take this reasons to take this   gabapentin 300 MG capsule Commonly known as: NEURONTIN take 2 capsules by mouth three times a day What changed: See the new instructions.   GOODY HEADACHE PO Take 1-2 packets by mouth daily as needed (pain).   lisinopril 20 MG tablet Commonly known as: ZESTRIL Take 20 mg by mouth daily.   metFORMIN 1000 MG tablet Commonly known as: GLUCOPHAGE Take 1,000 mg by mouth 2 (two) times daily with a meal.   omeprazole 20 MG capsule Commonly known as: PRILOSEC Take 20 mg by mouth daily.   oxyCODONE 5 MG immediate release tablet Commonly known as: Oxy IR/ROXICODONE Take 1 tablet (5 mg total) by mouth every 6 (six) hours as needed for severe pain.   rosuvastatin 10 MG tablet Commonly known as: CRESTOR Take 10 mg by mouth daily.   tiZANidine 4 MG tablet Commonly known as: ZANAFLEX TAKE 1.5 TABLETS BY MOUTH 4 TIMES DAILY.        Follow-up Information     Greer Pickerel, MD. Schedule an appointment as soon as possible for a visit in 3 week(s).   Specialty: General Surgery Why: For wound re-check Contact information: [address] [place] [address] [phone]         Surgery, [place]. Schedule an appointment as soon as possible for a visit in 1 week(s).   Specialty:  General Surgery Why: for nurse appt for drain removal Contact information: [place] [address] [phone]                  The results of significant diagnostics from this hospitalization (including imaging, microbiology, ancillary and laboratory) are listed below for reference.    Significant Diagnostic Studies: IR CHOLANGIOGRAM EXISTING TUBE  Result Date: 02/12/2023 INDICATION: History of acute cholecystitis and status post cholecystostomy tube placement on 12/25/2022. Evaluate for common bile duct patency. EXAM: CHOLANGIOGRAM THROUGH EXISTING TUBE MEDICATIONS: None ANESTHESIA/SEDATION: None FLUOROSCOPY TIME:  Radiation Exposure Index (as provided by the fluoroscopic device): 14 mGy Kerma CONTRAST:  30 mL Omnipaque [id number] COMPLICATIONS: None immediate. PROCEDURE: Patient was placed supine on the interventional table. Catheter bag was removed. An injectable  cap was placed on the tube. Cholangiogram was performed through the cholecystostomy tube. FINDINGS: Cholecystostomy tube is positioned within a decompressed and irregular gallbladder. Some of the contrast refluxed around the catheter and back to the skin. Cystic duct and common bile duct are patent. Small amount of contrast empties into the duodenum. IMPRESSION: 1. Cholecystostomy tube is positioned within the decompressed gallbladder. 2. Cystic duct and common bile duct are patent. No large filling defects or stones within the common bile duct. 3. Patient was started on a capping trial. Instructed the patient to flush the drain with 5 mL of saline every other day. Electronically Signed   By: Markus Daft M.D.   On: 02/12/2023 21:26    Microbiology: No results found for this or any previous visit (from the past 240 hour(s)).   Labs: Basic Metabolic Panel: Recent Labs  Lab 03/06/23 0452  NA 139  K 3.7  CL 106  CO2 23  GLUCOSE 148*  BUN 9  CREATININE 0.57  CALCIUM 9.3   Liver Function Tests: Recent  Labs  Lab 03/06/23 0452  AST 21  ALT 29  ALKPHOS 86  BILITOT 0.5  PROT 8.0  ALBUMIN 4.3   No results for input(s): "LIPASE", "AMYLASE" in the last 168 hours. No results for input(s): "AMMONIA" in the last 168 hours. CBC: Recent Labs  Lab 03/06/23 0452  WBC 17.5*  HGB 10.8*  HCT 35.8*  MCV 80.8  PLT 380   Cardiac Enzymes: No results for input(s): "CKTOTAL", "CKMB", "CKMBINDEX", "TROPONINI" in the last 168 hours. BNP: BNP (last 3 results) No results for input(s): "BNP" in the last 8760 hours.  ProBNP (last 3 results) No results for input(s): "PROBNP" in the last 8760 hours.  CBG: Recent Labs  Lab 03/05/23 1155 03/05/23 1536 03/05/23 2136 03/06/23 0720  GLUCAP 114* 131* 179* 145*    Principal Problem:   S/P laparoscopic cholecystectomy   Time coordinating discharge: 15 min  Signed:  Gayland Curry, MD [facility], [place] [phone] 03/06/2023, 11:41 AM

## 2023-03-06 NOTE — Discharge Instructions (Signed)
[facility] LAPAROSCOPIC [facility]ERY: POST OP INSTRUCTIONS Always review your discharge instruction sheet given to you by the facility where your surgery was performed. IF YOU HAVE DISABILITY OR FAMILY LEAVE FORMS, YOU MUST BRING THEM TO THE OFFICE FOR PROCESSING.   DO NOT GIVE THEM TO YOUR DOCTOR.  PAIN CONTROL  First take acetaminophen (Tylenol) AND/or ibuprofen (Advil) to control your pain after surgery.  Follow directions on package.  Taking acetaminophen (Tylenol) and/or ibuprofen (Advil) regularly after surgery will help to control your pain and lower the amount of prescription pain medication you may need.  You should not take more than 3,000 mg (3 grams) of acetaminophen (Tylenol) in 24 hours.  You should not take ibuprofen (Advil), aleve, motrin, naprosyn or other NSAIDS if you have a history of stomach ulcers or chronic kidney disease.  A prescription for pain medication may be given to you upon discharge.  Take your pain medication as prescribed, if you still have uncontrolled pain after taking acetaminophen (Tylenol) or ibuprofen (Advil). Use ice packs to help control pain. If you need a refill on your pain medication, please contact your pharmacy.  They will contact our office to request authorization. Prescriptions will not be filled after 5pm or on week-ends.  HOME MEDICATIONS Take your usually prescribed medications unless otherwise directed.  DIET You should follow a light diet the first few days after arrival home.  Be sure to include lots of fluids daily. Avoid fatty, fried foods.   CONSTIPATION It is common to experience some constipation after surgery and if you are taking pain medication.  Increasing fluid intake and taking a stool softener (such as Colace) will usually help or prevent this problem from occurring.  A mild laxative (Milk of Magnesia or Miralax) should be taken according to package instructions if there are no bowel movements after 48  hours.  WOUND/INCISION CARE Most patients will experience some swelling and bruising in the area of the incisions.  Ice packs will help.  Swelling and bruising can take several days to resolve.  Unless discharge instructions indicate otherwise, follow guidelines below  STERI-STRIPS - you may remove your outer bandages 48 hours after surgery, and you may shower at that time.  You have steri-strips (small skin tapes) in place directly over the incision.  These strips should be left on the skin for 7-10 days.   DERMABOND/SKIN GLUE - you may shower in 24 hours.  The glue will flake off over the next 2-3 weeks. Any sutures or staples will be removed at the office during your follow-up visit.  ACTIVITIES You may resume regular (light) daily activities beginning the next day--such as daily self-care, walking, climbing stairs--gradually increasing activities as tolerated.  You may have sexual intercourse when it is comfortable.  Refrain from any heavy lifting or straining until approved by your doctor. You may drive when you are no longer taking prescription pain medication, you can comfortably wear a seatbelt, and you can safely maneuver your car and apply brakes.  FOLLOW-UP You should see your doctor in the office for a follow-up appointment approximately 2-3 weeks after your surgery.  You should have been given your post-op/follow-up appointment when your surgery was scheduled.  If you did not receive a post-op/follow-up appointment, make sure that you call for this appointment within a day or two after you arrive home to insure a convenient appointment time.  OTHER INSTRUCTIONS SEE [facility] DRAIN INSTRUCTIONS  WHEN TO CALL YOUR DOCTOR: Fever over 101.0 Inability  to urinate Continued bleeding from incision. Increased pain, redness, or drainage from the incision. Increasing abdominal pain GREEN DRAINAGE IN SURGICAL DRAIN  The clinic staff is available to answer your questions during regular business  hours.  Please don't hesitate to call and ask to speak to one of the nurses for clinical concerns.  If you have a medical emergency, go to the nearest emergency room or call 911.  A surgeon from [facility] is always on call at the hospital. [address], [place], [place], [place]  [address] ? [address] [place], [place], [place]   [address] [phone] ? [phone] ? FAX [phone] Web site: [email or web address]

## 2023-03-07 LAB — SURGICAL PATHOLOGY

## 2023-03-14 ENCOUNTER — Ambulatory Visit: Payer: Medicare HMO | Admitting: Internal Medicine

## 2023-03-21 ENCOUNTER — Ambulatory Visit (INDEPENDENT_AMBULATORY_CARE_PROVIDER_SITE_OTHER): Payer: Medicare HMO | Admitting: Internal Medicine

## 2023-03-21 ENCOUNTER — Other Ambulatory Visit: Payer: Self-pay

## 2023-03-21 DIAGNOSIS — K819 Cholecystitis, unspecified: Secondary | ICD-10-CM

## 2023-03-21 NOTE — Progress Notes (Signed)
Patient Active Problem List   Diagnosis Date Noted   S/P laparoscopic cholecystectomy 03/05/2023   Salmonella bacteremia 12/28/2022   AKI (acute kidney injury) 12/28/2022   Acute cholecystitis 12/24/2022   Pressure injury of skin 12/24/2022   Intractable pain 12/24/2022   Fibroid 10/20/2021   Abnormal CT scan, pelvis 10/13/2021   Encounter for cervical Pap smear with pelvic exam 10/13/2021   CAP (community acquired pneumonia) 11/26/2013   Tendonitis of elbow, left 05/26/2013   Neurogenic bladder 01/29/2013   Neurogenic bowel 01/29/2013   GERD (gastroesophageal reflux disease) 01/29/2013   Myalgia and myositis, unspecified 12/24/2012   Spastic paralysis (HCC) 12/24/2012   Paraplegia 11/18/2012   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   GABAPENTIN (NEURONTIN) 300 MG CAPSULE    take 2 capsules by mouth three times a day   LISINOPRIL (ZESTRIL) 20 MG TABLET    Take 20 mg by mouth daily.   METFORMIN (GLUCOPHAGE) 1000 MG TABLET    Take 1,000 mg by mouth 2 (two) times daily with a meal.   OMEPRAZOLE (PRILOSEC) 20 MG CAPSULE    Take 20 mg by mouth daily.   OXYCODONE (OXY IR/ROXICODONE) 5 MG IMMEDIATE RELEASE TABLET    Take 1 tablet (5 mg total) by mouth every 6 (six) hours as needed for severe pain.   ROSUVASTATIN (CRESTOR) 10 MG TABLET    Take 10 mg by mouth daily.   TIZANIDINE (ZANAFLEX) 4 MG TABLET    TAKE 1.5 TABLETS BY MOUTH 4 TIMES DAILY.  Modified Medications   No medications on file  Discontinued Medications   No medications on file    Subjective: [name] is a 47 y.o. 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  Patient transferred from [facility] to [place] on 1/21 with Salmonella bacteremia and acute cholecystitis with worsening renal failure.-At OSH she had presented with 3-day history of nausea and vomiting.  Urine cultures grew E. coli.  Initially on ceftriaxone and metronidazole then transition to Levaquin given blood cultures returned positive for Salmonella.  She was found to have cholecystitis on imaging.  At our facility right upper quadrant ultrasound showed cholelithiasis and gallbladder sludge with additional concern for acute cholecystitis, concern for gallbladder perforation as well.  Radiology placed a cholecystostomy tube for perforated cholecystitis on 1/22, cultures with no growth.  General surgery plans on outpatient follow-up at 6 to 8 weeks.  ID engaged patient discharged on Levaquin and metronidazole given bacteremia secondary to gallbladder.  Repeat CT on 1/24 showed decompressed gallbladder with thick walled and surrounding fluid inflammatory findings and ascites similar to prior.  Mesenteric edema along perihepatic bursitis enthesitis tracking along paracolic gutter. 01/08/23, pt reports she feels much better. Denies abdominal pain. She is adherent to levaquin and metronidazole.  01/17/23 Video visit today: Doing well, no new complaints.  02/21/23: No new complaints. Lap choly planned on 4/1 per pt. She ran out of abx  2 weeks ago. No concerns.  Today 03/21/23:Doing well, SP choly, No new complaints. Review of Systems: Review of Systems  All other systems reviewed and are negative.   Past Medical History:  Diagnosis Date   Chronic myofascial pain    Chronic pain    hip pain due to avascular necrosis   Fibroid 10/20/2021   Hypertension     Mass 2013   T5-T6 intradural mass   Neurogenic bladder    and bowel   Spastic paraplegia 2013    Social History   Tobacco Use   Smoking status: Former    Packs/day: 1.50    Years: 10.00    Additional pack years: 0.00    Total pack years: 15.00    Types: Cigarettes    Quit date: 11/07/2012    Years since quitting: 10.3   Smokeless tobacco: Never   Tobacco comments:    quit 5 yrs ago  Vaping Use   Vaping Use: Never used  Substance Use Topics   Alcohol use: No   Drug use: No    Family History  Problem Relation Age of Onset   Multiple sclerosis Father    Diabetes Mother     Allergies  Allergen Reactions   Honey Bee Treatment [Bee Venom] Anaphylaxis and Itching   Baclofen     Headaches     Health Maintenance  Topic Date Due   COVID-19 Vaccine (1) Never done   Hepatitis C Screening  Never done   DTaP/Tdap/Td (1 - Tdap) Never done   COLONOSCOPY (Pts 45-37yrs Insurance coverage will need to be confirmed)  Never done   INFLUENZA VACCINE  07/05/2023   Medicare Annual Wellness (AWV)  07/27/2023   PAP SMEAR-Modifier  10/13/2024   HIV Screening  Completed   HPV VACCINES  Aged Out    Objective:  There were no vitals filed for this visit. There is no height or weight on file to calculate BMI.  Physical Exam Constitutional:      Appearance: Normal appearance.  HENT:     Head: Normocephalic and atraumatic.     Right Ear: Tympanic membrane normal.     Left Ear: Tympanic membrane normal.     Nose: Nose normal.     Mouth/Throat:     Mouth: Mucous membranes are moist.  Eyes:     Extraocular Movements: Extraocular movements intact.     Conjunctiva/sclera: Conjunctivae normal.     Pupils: Pupils are equal, round, and reactive to light.  Cardiovascular:     Rate and Rhythm: Normal rate and regular rhythm.     Heart sounds: No murmur heard.    No friction rub. No gallop.  Pulmonary:     Effort: Pulmonary effort is normal.     Breath sounds: Normal breath  sounds.  Abdominal:     General: Abdomen is flat.     Palpations: Abdomen is soft.  Musculoskeletal:        General: Normal range of motion.  Skin:    General: Skin is warm and dry.  Neurological:     General: No focal deficit present.     Mental Status: She is alert and oriented to person, place, and time.  Psychiatric:        Mood and Affect: Mood normal.     Lab Results Lab Results  Component Value Date   WBC 17.5 (H) 03/06/2023   HGB 10.8 (L) 03/06/2023   HCT 35.8 (L) 03/06/2023   MCV 80.8 03/06/2023   PLT  380 03/06/2023    Lab Results  Component Value Date   CREATININE 0.57 03/06/2023   BUN 9 03/06/2023   NA 139 03/06/2023   K 3.7 03/06/2023   CL 106 03/06/2023   CO2 23 03/06/2023    Lab Results  Component Value Date   ALT 29 03/06/2023   AST 21 03/06/2023   ALKPHOS 86 03/06/2023   BILITOT 0.5 03/06/2023    No results found for: "CHOL", "HDL", "LDLCALC", "LDLDIRECT", "TRIG", "CHOLHDL" No results found for: "LABRPR", "RPRTITER" No results found for: "HIV1RNAQUANT", "HIV1RNAVL", "CD4TABS"   Problem List Items Addressed This Visit   None  Assessment/Plan #Salmonella bacteremia #E. coli UTI #Perforated cholecystitis status post cholecystotomy tube placement on 1/22 #History of L spine's tumor status post resection leaving a paraplegic #Leukocytosis -Repeat CT on 1/24 showed decompressed gallbladder with thick walled and surrounding fluid inflammatory findings and ascites similar to prior.  Mesenteric edema along perihepatic bursitis enthesitis tracking along paracolic gutter. -Clinically pt is doing well. She has outpatient appt with IR on 3/6 to evaluate choly tube -Ran out of abx around 3/4, labs on 3/20 stable(ESR 31, CRP Nl), LFT nl.  -Lap choly on 03/05/23 -Per pt seen by Dr. Andrey Campanile last week, all drains are out. Pt is doing well.  Plan: F/U PRN  Danelle Earthly, MD [facility] 03/21/2023, 3:43 PM

## 2023-05-02 DIAGNOSIS — Z0000 Encounter for general adult medical examination without abnormal findings: Secondary | ICD-10-CM | POA: Diagnosis not present

## 2023-05-02 DIAGNOSIS — R519 Headache, unspecified: Secondary | ICD-10-CM | POA: Diagnosis not present

## 2023-05-02 DIAGNOSIS — K812 Acute cholecystitis with chronic cholecystitis: Secondary | ICD-10-CM | POA: Diagnosis not present

## 2023-05-02 DIAGNOSIS — E1169 Type 2 diabetes mellitus with other specified complication: Secondary | ICD-10-CM | POA: Diagnosis not present

## 2023-05-02 DIAGNOSIS — G041 Tropical spastic paraplegia: Secondary | ICD-10-CM | POA: Diagnosis not present

## 2023-05-02 DIAGNOSIS — E7849 Other hyperlipidemia: Secondary | ICD-10-CM | POA: Diagnosis not present

## 2023-05-02 DIAGNOSIS — M7918 Myalgia, other site: Secondary | ICD-10-CM | POA: Diagnosis not present

## 2023-05-02 DIAGNOSIS — I700 Atherosclerosis of aorta: Secondary | ICD-10-CM | POA: Diagnosis not present

## 2023-05-02 DIAGNOSIS — I10 Essential (primary) hypertension: Secondary | ICD-10-CM | POA: Diagnosis not present

## 2023-08-27 DIAGNOSIS — I700 Atherosclerosis of aorta: Secondary | ICD-10-CM | POA: Diagnosis not present

## 2023-08-27 DIAGNOSIS — R519 Headache, unspecified: Secondary | ICD-10-CM | POA: Diagnosis not present

## 2023-08-27 DIAGNOSIS — E7849 Other hyperlipidemia: Secondary | ICD-10-CM | POA: Diagnosis not present

## 2023-08-27 DIAGNOSIS — Z0000 Encounter for general adult medical examination without abnormal findings: Secondary | ICD-10-CM | POA: Diagnosis not present

## 2023-08-27 DIAGNOSIS — E0843 Diabetes mellitus due to underlying condition with diabetic autonomic (poly)neuropathy: Secondary | ICD-10-CM | POA: Diagnosis not present

## 2023-08-27 DIAGNOSIS — G041 Tropical spastic paraplegia: Secondary | ICD-10-CM | POA: Diagnosis not present

## 2023-08-27 DIAGNOSIS — I10 Essential (primary) hypertension: Secondary | ICD-10-CM | POA: Diagnosis not present

## 2023-08-27 DIAGNOSIS — E1143 Type 2 diabetes mellitus with diabetic autonomic (poly)neuropathy: Secondary | ICD-10-CM | POA: Diagnosis not present

## 2023-08-27 DIAGNOSIS — E1169 Type 2 diabetes mellitus with other specified complication: Secondary | ICD-10-CM | POA: Diagnosis not present

## 2023-08-27 DIAGNOSIS — M7918 Myalgia, other site: Secondary | ICD-10-CM | POA: Diagnosis not present

## 2023-08-30 ENCOUNTER — Encounter (INDEPENDENT_AMBULATORY_CARE_PROVIDER_SITE_OTHER): Payer: Self-pay | Admitting: *Deleted

## 2023-12-25 DIAGNOSIS — M7918 Myalgia, other site: Secondary | ICD-10-CM | POA: Diagnosis not present

## 2023-12-25 DIAGNOSIS — Z0000 Encounter for general adult medical examination without abnormal findings: Secondary | ICD-10-CM | POA: Diagnosis not present

## 2023-12-25 DIAGNOSIS — R519 Headache, unspecified: Secondary | ICD-10-CM | POA: Diagnosis not present

## 2023-12-25 DIAGNOSIS — I10 Essential (primary) hypertension: Secondary | ICD-10-CM | POA: Diagnosis not present

## 2023-12-25 DIAGNOSIS — I700 Atherosclerosis of aorta: Secondary | ICD-10-CM | POA: Diagnosis not present

## 2023-12-25 DIAGNOSIS — E1143 Type 2 diabetes mellitus with diabetic autonomic (poly)neuropathy: Secondary | ICD-10-CM | POA: Diagnosis not present

## 2023-12-25 DIAGNOSIS — G041 Tropical spastic paraplegia: Secondary | ICD-10-CM | POA: Diagnosis not present

## 2023-12-25 DIAGNOSIS — E7849 Other hyperlipidemia: Secondary | ICD-10-CM | POA: Diagnosis not present

## 2024-02-27 ENCOUNTER — Encounter (INDEPENDENT_AMBULATORY_CARE_PROVIDER_SITE_OTHER): Payer: Self-pay | Admitting: *Deleted

## 2024-04-23 DIAGNOSIS — I700 Atherosclerosis of aorta: Secondary | ICD-10-CM | POA: Diagnosis not present

## 2024-04-23 DIAGNOSIS — G041 Tropical spastic paraplegia: Secondary | ICD-10-CM | POA: Diagnosis not present

## 2024-04-23 DIAGNOSIS — I10 Essential (primary) hypertension: Secondary | ICD-10-CM | POA: Diagnosis not present

## 2024-04-23 DIAGNOSIS — L603 Nail dystrophy: Secondary | ICD-10-CM | POA: Diagnosis not present

## 2024-04-23 DIAGNOSIS — E1143 Type 2 diabetes mellitus with diabetic autonomic (poly)neuropathy: Secondary | ICD-10-CM | POA: Diagnosis not present

## 2024-04-23 DIAGNOSIS — L609 Nail disorder, unspecified: Secondary | ICD-10-CM | POA: Diagnosis not present

## 2024-04-23 DIAGNOSIS — M7918 Myalgia, other site: Secondary | ICD-10-CM | POA: Diagnosis not present

## 2024-04-23 DIAGNOSIS — R519 Headache, unspecified: Secondary | ICD-10-CM | POA: Diagnosis not present

## 2024-04-23 DIAGNOSIS — E7849 Other hyperlipidemia: Secondary | ICD-10-CM | POA: Diagnosis not present

## 2024-09-22 DIAGNOSIS — R519 Headache, unspecified: Secondary | ICD-10-CM | POA: Diagnosis not present

## 2024-09-22 DIAGNOSIS — G041 Tropical spastic paraplegia: Secondary | ICD-10-CM | POA: Diagnosis not present

## 2024-09-22 DIAGNOSIS — M7918 Myalgia, other site: Secondary | ICD-10-CM | POA: Diagnosis not present

## 2024-09-22 DIAGNOSIS — E7849 Other hyperlipidemia: Secondary | ICD-10-CM | POA: Diagnosis not present

## 2024-09-22 DIAGNOSIS — E662 Morbid (severe) obesity with alveolar hypoventilation: Secondary | ICD-10-CM | POA: Diagnosis not present

## 2024-09-22 DIAGNOSIS — I10 Essential (primary) hypertension: Secondary | ICD-10-CM | POA: Diagnosis not present

## 2024-09-22 DIAGNOSIS — I700 Atherosclerosis of aorta: Secondary | ICD-10-CM | POA: Diagnosis not present

## 2024-09-22 DIAGNOSIS — E1143 Type 2 diabetes mellitus with diabetic autonomic (poly)neuropathy: Secondary | ICD-10-CM | POA: Diagnosis not present
